# Patient Record
Sex: Male | Born: 1935 | Race: White | Hispanic: No | Smoking: Never smoker
Health system: Southern US, Community
[De-identification: ages and names within clinical notes are randomized; demographics above are authoritative.]

## PROBLEM LIST (undated history)

## (undated) DIAGNOSIS — E119 Type 2 diabetes mellitus without complications: Secondary | ICD-10-CM

## (undated) DIAGNOSIS — I10 Essential (primary) hypertension: Secondary | ICD-10-CM

## (undated) DIAGNOSIS — H9190 Unspecified hearing loss, unspecified ear: Secondary | ICD-10-CM

## (undated) DIAGNOSIS — Z8719 Personal history of other diseases of the digestive system: Secondary | ICD-10-CM

## (undated) DIAGNOSIS — Z8739 Personal history of other diseases of the musculoskeletal system and connective tissue: Secondary | ICD-10-CM

## (undated) DIAGNOSIS — Z8781 Personal history of (healed) traumatic fracture: Secondary | ICD-10-CM

## (undated) DIAGNOSIS — K219 Gastro-esophageal reflux disease without esophagitis: Secondary | ICD-10-CM

## (undated) DIAGNOSIS — E0500 Thyrotoxicosis with diffuse goiter without thyrotoxic crisis or storm: Secondary | ICD-10-CM

## (undated) DIAGNOSIS — J189 Pneumonia, unspecified organism: Secondary | ICD-10-CM

## (undated) DIAGNOSIS — Z87442 Personal history of urinary calculi: Secondary | ICD-10-CM

## (undated) DIAGNOSIS — C801 Malignant (primary) neoplasm, unspecified: Secondary | ICD-10-CM

## (undated) DIAGNOSIS — J302 Other seasonal allergic rhinitis: Secondary | ICD-10-CM

## (undated) DIAGNOSIS — M1990 Unspecified osteoarthritis, unspecified site: Secondary | ICD-10-CM

## (undated) HISTORY — DX: Other seasonal allergic rhinitis: J30.2

## (undated) HISTORY — DX: Thyrotoxicosis with diffuse goiter without thyrotoxic crisis or storm: E05.00

## (undated) HISTORY — DX: Unspecified hearing loss, unspecified ear: H91.90

## (undated) HISTORY — PX: HELLER MYOTOMY: SHX5259

## (undated) HISTORY — PX: LOBECTOMY: SHX5089

## (undated) HISTORY — PX: THORACOSCOPY: SUR1347

---

## 1995-11-21 ENCOUNTER — Encounter: Payer: Self-pay | Admitting: Internal Medicine

## 1998-04-28 ENCOUNTER — Encounter: Payer: Self-pay | Admitting: Urology

## 1998-04-28 ENCOUNTER — Observation Stay (HOSPITAL_COMMUNITY): Admission: RE | Admit: 1998-04-28 | Discharge: 1998-04-29 | Payer: Self-pay | Admitting: Urology

## 1998-05-26 ENCOUNTER — Ambulatory Visit: Admission: RE | Admit: 1998-05-26 | Discharge: 1998-05-26 | Payer: Self-pay | Admitting: Radiation Oncology

## 1998-06-07 ENCOUNTER — Encounter: Admission: RE | Admit: 1998-06-07 | Discharge: 1998-08-30 | Payer: Self-pay | Admitting: Radiation Oncology

## 1998-08-31 ENCOUNTER — Encounter: Admission: RE | Admit: 1998-08-31 | Discharge: 1998-11-29 | Payer: Self-pay | Admitting: Radiation Oncology

## 1998-11-30 ENCOUNTER — Encounter: Payer: Self-pay | Admitting: Urology

## 1998-11-30 ENCOUNTER — Encounter: Admission: RE | Admit: 1998-11-30 | Discharge: 1998-11-30 | Payer: Self-pay | Admitting: Urology

## 1998-12-01 ENCOUNTER — Other Ambulatory Visit: Admission: RE | Admit: 1998-12-01 | Discharge: 1998-12-01 | Payer: Self-pay | Admitting: Internal Medicine

## 1998-12-01 ENCOUNTER — Encounter (INDEPENDENT_AMBULATORY_CARE_PROVIDER_SITE_OTHER): Payer: Self-pay | Admitting: *Deleted

## 1998-12-01 ENCOUNTER — Encounter (INDEPENDENT_AMBULATORY_CARE_PROVIDER_SITE_OTHER): Payer: Self-pay | Admitting: Specialist

## 1998-12-08 ENCOUNTER — Encounter: Admission: RE | Admit: 1998-12-08 | Discharge: 1998-12-08 | Payer: Self-pay | Admitting: Urology

## 1998-12-08 ENCOUNTER — Encounter: Payer: Self-pay | Admitting: Urology

## 1999-02-16 ENCOUNTER — Encounter: Payer: Self-pay | Admitting: Internal Medicine

## 1999-02-21 ENCOUNTER — Encounter: Payer: Self-pay | Admitting: Internal Medicine

## 1999-02-21 ENCOUNTER — Ambulatory Visit (HOSPITAL_COMMUNITY): Admission: RE | Admit: 1999-02-21 | Discharge: 1999-02-21 | Payer: Self-pay | Admitting: Internal Medicine

## 1999-03-24 ENCOUNTER — Encounter (INDEPENDENT_AMBULATORY_CARE_PROVIDER_SITE_OTHER): Payer: Self-pay | Admitting: *Deleted

## 1999-03-24 ENCOUNTER — Ambulatory Visit (HOSPITAL_COMMUNITY): Admission: RE | Admit: 1999-03-24 | Discharge: 1999-03-24 | Payer: Self-pay | Admitting: Internal Medicine

## 1999-03-24 ENCOUNTER — Encounter: Payer: Self-pay | Admitting: Internal Medicine

## 2000-03-27 ENCOUNTER — Encounter: Payer: Self-pay | Admitting: Urology

## 2000-03-27 ENCOUNTER — Encounter: Admission: RE | Admit: 2000-03-27 | Discharge: 2000-03-27 | Payer: Self-pay | Admitting: Urology

## 2000-06-06 ENCOUNTER — Encounter: Admission: RE | Admit: 2000-06-06 | Discharge: 2000-06-06 | Payer: Self-pay | Admitting: Urology

## 2000-06-06 ENCOUNTER — Encounter: Payer: Self-pay | Admitting: Urology

## 2000-10-18 ENCOUNTER — Encounter: Payer: Self-pay | Admitting: Urology

## 2000-10-18 ENCOUNTER — Encounter (INDEPENDENT_AMBULATORY_CARE_PROVIDER_SITE_OTHER): Payer: Self-pay | Admitting: *Deleted

## 2000-10-18 ENCOUNTER — Encounter: Admission: RE | Admit: 2000-10-18 | Discharge: 2000-10-18 | Payer: Self-pay | Admitting: Urology

## 2000-10-22 ENCOUNTER — Ambulatory Visit (HOSPITAL_COMMUNITY): Admission: EM | Admit: 2000-10-22 | Discharge: 2000-10-22 | Payer: Self-pay | Admitting: Emergency Medicine

## 2000-10-22 ENCOUNTER — Encounter: Payer: Self-pay | Admitting: Urology

## 2000-10-26 ENCOUNTER — Encounter: Admission: RE | Admit: 2000-10-26 | Discharge: 2000-10-26 | Payer: Self-pay | Admitting: Urology

## 2000-10-26 ENCOUNTER — Encounter: Payer: Self-pay | Admitting: Urology

## 2002-05-16 ENCOUNTER — Encounter: Admission: RE | Admit: 2002-05-16 | Discharge: 2002-05-30 | Payer: Self-pay | Admitting: Neurological Surgery

## 2003-01-01 ENCOUNTER — Inpatient Hospital Stay (HOSPITAL_COMMUNITY): Admission: RE | Admit: 2003-01-01 | Discharge: 2003-01-02 | Payer: Self-pay | Admitting: Neurological Surgery

## 2003-08-05 ENCOUNTER — Inpatient Hospital Stay (HOSPITAL_COMMUNITY): Admission: RE | Admit: 2003-08-05 | Discharge: 2003-08-09 | Payer: Self-pay | Admitting: Orthopedic Surgery

## 2003-10-07 ENCOUNTER — Emergency Department (HOSPITAL_COMMUNITY): Admission: EM | Admit: 2003-10-07 | Discharge: 2003-10-07 | Payer: Self-pay | Admitting: Emergency Medicine

## 2003-10-09 ENCOUNTER — Ambulatory Visit (HOSPITAL_COMMUNITY): Admission: RE | Admit: 2003-10-09 | Discharge: 2003-10-09 | Payer: Self-pay | Admitting: Urology

## 2003-10-12 ENCOUNTER — Ambulatory Visit (HOSPITAL_COMMUNITY): Admission: RE | Admit: 2003-10-12 | Discharge: 2003-10-12 | Payer: Self-pay | Admitting: Urology

## 2003-11-11 ENCOUNTER — Encounter: Payer: Self-pay | Admitting: Internal Medicine

## 2003-11-12 ENCOUNTER — Inpatient Hospital Stay (HOSPITAL_COMMUNITY): Admission: RE | Admit: 2003-11-12 | Discharge: 2003-11-15 | Payer: Self-pay | Admitting: Orthopedic Surgery

## 2003-11-30 ENCOUNTER — Ambulatory Visit (HOSPITAL_COMMUNITY): Admission: RE | Admit: 2003-11-30 | Discharge: 2003-11-30 | Payer: Self-pay | Admitting: Internal Medicine

## 2003-11-30 ENCOUNTER — Encounter: Payer: Self-pay | Admitting: Internal Medicine

## 2007-01-15 ENCOUNTER — Ambulatory Visit: Payer: Self-pay | Admitting: Internal Medicine

## 2007-02-06 ENCOUNTER — Ambulatory Visit: Payer: Self-pay | Admitting: Internal Medicine

## 2007-08-15 ENCOUNTER — Encounter: Payer: Self-pay | Admitting: Internal Medicine

## 2008-01-24 ENCOUNTER — Emergency Department (HOSPITAL_COMMUNITY): Admission: EM | Admit: 2008-01-24 | Discharge: 2008-01-24 | Payer: Self-pay | Admitting: Emergency Medicine

## 2008-10-12 ENCOUNTER — Ambulatory Visit (HOSPITAL_COMMUNITY): Admission: RE | Admit: 2008-10-12 | Discharge: 2008-10-12 | Payer: Self-pay | Admitting: Urology

## 2008-10-22 ENCOUNTER — Encounter (INDEPENDENT_AMBULATORY_CARE_PROVIDER_SITE_OTHER): Payer: Self-pay | Admitting: *Deleted

## 2009-01-30 HISTORY — PX: HIATAL HERNIA REPAIR: SHX195

## 2009-02-17 ENCOUNTER — Telehealth (INDEPENDENT_AMBULATORY_CARE_PROVIDER_SITE_OTHER): Payer: Self-pay | Admitting: *Deleted

## 2009-02-17 ENCOUNTER — Ambulatory Visit: Payer: Self-pay | Admitting: Internal Medicine

## 2009-02-17 ENCOUNTER — Encounter (INDEPENDENT_AMBULATORY_CARE_PROVIDER_SITE_OTHER): Payer: Self-pay | Admitting: *Deleted

## 2009-02-18 ENCOUNTER — Ambulatory Visit: Payer: Self-pay | Admitting: Internal Medicine

## 2009-02-24 ENCOUNTER — Telehealth: Payer: Self-pay | Admitting: Internal Medicine

## 2009-02-24 DIAGNOSIS — R1310 Dysphagia, unspecified: Secondary | ICD-10-CM | POA: Insufficient documentation

## 2009-02-25 ENCOUNTER — Ambulatory Visit (HOSPITAL_COMMUNITY): Admission: RE | Admit: 2009-02-25 | Discharge: 2009-02-25 | Payer: Self-pay | Admitting: Internal Medicine

## 2009-02-25 ENCOUNTER — Telehealth (INDEPENDENT_AMBULATORY_CARE_PROVIDER_SITE_OTHER): Payer: Self-pay

## 2009-02-25 ENCOUNTER — Ambulatory Visit: Payer: Self-pay | Admitting: Internal Medicine

## 2009-02-25 DIAGNOSIS — R933 Abnormal findings on diagnostic imaging of other parts of digestive tract: Secondary | ICD-10-CM | POA: Insufficient documentation

## 2009-02-25 LAB — CONVERTED CEMR LAB
BUN: 15 mg/dL (ref 6–23)
Creatinine, Ser: 1.1 mg/dL (ref 0.4–1.5)

## 2009-02-26 ENCOUNTER — Telehealth: Payer: Self-pay | Admitting: Internal Medicine

## 2009-02-26 ENCOUNTER — Ambulatory Visit: Payer: Self-pay | Admitting: Internal Medicine

## 2009-02-28 ENCOUNTER — Telehealth: Payer: Self-pay | Admitting: Gastroenterology

## 2009-02-28 ENCOUNTER — Encounter (INDEPENDENT_AMBULATORY_CARE_PROVIDER_SITE_OTHER): Payer: Self-pay | Admitting: *Deleted

## 2009-02-28 ENCOUNTER — Emergency Department (HOSPITAL_COMMUNITY): Admission: EM | Admit: 2009-02-28 | Discharge: 2009-02-28 | Payer: Self-pay | Admitting: Emergency Medicine

## 2009-03-02 ENCOUNTER — Ambulatory Visit (HOSPITAL_COMMUNITY): Admission: RE | Admit: 2009-03-02 | Discharge: 2009-03-02 | Payer: Self-pay | Admitting: Internal Medicine

## 2009-03-02 ENCOUNTER — Encounter: Payer: Self-pay | Admitting: Internal Medicine

## 2009-03-04 ENCOUNTER — Ambulatory Visit: Payer: Self-pay | Admitting: Internal Medicine

## 2009-03-05 ENCOUNTER — Telehealth: Payer: Self-pay | Admitting: Internal Medicine

## 2009-03-08 ENCOUNTER — Telehealth: Payer: Self-pay | Admitting: Internal Medicine

## 2009-03-09 ENCOUNTER — Ambulatory Visit: Payer: Self-pay | Admitting: Internal Medicine

## 2009-03-09 ENCOUNTER — Encounter (INDEPENDENT_AMBULATORY_CARE_PROVIDER_SITE_OTHER): Payer: Self-pay | Admitting: *Deleted

## 2009-03-09 DIAGNOSIS — K220 Achalasia of cardia: Secondary | ICD-10-CM | POA: Insufficient documentation

## 2009-03-09 DIAGNOSIS — K625 Hemorrhage of anus and rectum: Secondary | ICD-10-CM | POA: Insufficient documentation

## 2009-03-10 ENCOUNTER — Ambulatory Visit (HOSPITAL_COMMUNITY): Admission: RE | Admit: 2009-03-10 | Discharge: 2009-03-10 | Payer: Self-pay | Admitting: Internal Medicine

## 2009-03-10 ENCOUNTER — Encounter: Payer: Self-pay | Admitting: Internal Medicine

## 2009-04-20 ENCOUNTER — Telehealth (INDEPENDENT_AMBULATORY_CARE_PROVIDER_SITE_OTHER): Payer: Self-pay | Admitting: *Deleted

## 2009-04-26 ENCOUNTER — Encounter (INDEPENDENT_AMBULATORY_CARE_PROVIDER_SITE_OTHER): Payer: Self-pay

## 2009-04-27 ENCOUNTER — Telehealth: Payer: Self-pay | Admitting: Internal Medicine

## 2009-04-27 ENCOUNTER — Ambulatory Visit: Payer: Self-pay | Admitting: Internal Medicine

## 2009-04-30 ENCOUNTER — Telehealth: Payer: Self-pay | Admitting: Internal Medicine

## 2009-05-10 ENCOUNTER — Ambulatory Visit: Payer: Self-pay | Admitting: Internal Medicine

## 2009-05-12 ENCOUNTER — Encounter: Payer: Self-pay | Admitting: Internal Medicine

## 2009-05-13 ENCOUNTER — Inpatient Hospital Stay (HOSPITAL_COMMUNITY): Admission: EM | Admit: 2009-05-13 | Discharge: 2009-05-14 | Payer: Self-pay | Admitting: Emergency Medicine

## 2009-05-13 ENCOUNTER — Ambulatory Visit: Payer: Self-pay | Admitting: Internal Medicine

## 2009-05-13 ENCOUNTER — Telehealth (INDEPENDENT_AMBULATORY_CARE_PROVIDER_SITE_OTHER): Payer: Self-pay | Admitting: *Deleted

## 2009-07-27 ENCOUNTER — Telehealth: Payer: Self-pay | Admitting: Internal Medicine

## 2009-07-27 ENCOUNTER — Encounter (INDEPENDENT_AMBULATORY_CARE_PROVIDER_SITE_OTHER): Payer: Self-pay | Admitting: *Deleted

## 2009-07-27 ENCOUNTER — Encounter: Payer: Self-pay | Admitting: Internal Medicine

## 2009-07-28 ENCOUNTER — Ambulatory Visit: Payer: Self-pay | Admitting: Gastroenterology

## 2009-07-28 ENCOUNTER — Ambulatory Visit: Payer: Self-pay | Admitting: Cardiology

## 2009-07-28 DIAGNOSIS — Z860100 Personal history of colon polyps, unspecified: Secondary | ICD-10-CM | POA: Insufficient documentation

## 2009-07-28 DIAGNOSIS — R1032 Left lower quadrant pain: Secondary | ICD-10-CM | POA: Insufficient documentation

## 2009-07-28 DIAGNOSIS — K5730 Diverticulosis of large intestine without perforation or abscess without bleeding: Secondary | ICD-10-CM | POA: Insufficient documentation

## 2009-07-28 DIAGNOSIS — I10 Essential (primary) hypertension: Secondary | ICD-10-CM | POA: Insufficient documentation

## 2009-07-28 DIAGNOSIS — K219 Gastro-esophageal reflux disease without esophagitis: Secondary | ICD-10-CM | POA: Insufficient documentation

## 2009-07-28 DIAGNOSIS — R1012 Left upper quadrant pain: Secondary | ICD-10-CM | POA: Insufficient documentation

## 2009-07-28 LAB — CONVERTED CEMR LAB
BUN: 18 mg/dL (ref 6–23)
Basophils Absolute: 0 10*3/uL (ref 0.0–0.1)
Basophils Relative: 0.3 % (ref 0.0–3.0)
CO2: 29 meq/L (ref 19–32)
Calcium: 9.6 mg/dL (ref 8.4–10.5)
Chloride: 106 meq/L (ref 96–112)
Creatinine, Ser: 1 mg/dL (ref 0.4–1.5)
Eosinophils Absolute: 0.1 10*3/uL (ref 0.0–0.7)
Eosinophils Relative: 2 % (ref 0.0–5.0)
GFR calc non Af Amer: 73.76 mL/min (ref >60)
Glucose, Bld: 92 mg/dL (ref 70–99)
HCT: 46.5 % (ref 39.0–52.0)
Hemoglobin: 15.6 g/dL (ref 13.0–17.0)
Lymphocytes Relative: 21.9 % (ref 12.0–46.0)
Lymphs Abs: 1.6 10*3/uL (ref 0.7–4.0)
MCHC: 33.6 g/dL (ref 30.0–36.0)
MCV: 91.3 fL (ref 78.0–100.0)
Monocytes Absolute: 0.8 10*3/uL (ref 0.1–1.0)
Monocytes Relative: 11.5 % (ref 3.0–12.0)
Neutro Abs: 4.7 10*3/uL (ref 1.4–7.7)
Neutrophils Relative %: 64.3 % (ref 43.0–77.0)
Platelets: 228 10*3/uL (ref 150.0–400.0)
Potassium: 4.8 meq/L (ref 3.5–5.1)
RBC: 5.09 M/uL (ref 4.22–5.81)
RDW: 12.9 % (ref 11.5–14.6)
Sodium: 142 meq/L (ref 135–145)
WBC: 7.3 10*3/uL (ref 4.5–10.5)

## 2009-08-30 ENCOUNTER — Ambulatory Visit: Payer: Self-pay | Admitting: Internal Medicine

## 2009-08-30 DIAGNOSIS — K5900 Constipation, unspecified: Secondary | ICD-10-CM | POA: Insufficient documentation

## 2009-08-30 DIAGNOSIS — K5732 Diverticulitis of large intestine without perforation or abscess without bleeding: Secondary | ICD-10-CM | POA: Insufficient documentation

## 2009-09-29 ENCOUNTER — Ambulatory Visit: Payer: Self-pay | Admitting: Internal Medicine

## 2009-10-07 ENCOUNTER — Ambulatory Visit: Payer: Self-pay | Admitting: Internal Medicine

## 2009-10-11 ENCOUNTER — Encounter: Payer: Self-pay | Admitting: Internal Medicine

## 2009-11-15 ENCOUNTER — Telehealth: Payer: Self-pay | Admitting: Internal Medicine

## 2009-11-16 ENCOUNTER — Ambulatory Visit: Payer: Self-pay | Admitting: Gastroenterology

## 2009-11-16 ENCOUNTER — Encounter: Payer: Self-pay | Admitting: Internal Medicine

## 2009-11-16 DIAGNOSIS — Z8546 Personal history of malignant neoplasm of prostate: Secondary | ICD-10-CM | POA: Insufficient documentation

## 2009-11-16 DIAGNOSIS — K224 Dyskinesia of esophagus: Secondary | ICD-10-CM | POA: Insufficient documentation

## 2009-11-16 LAB — CONVERTED CEMR LAB
ALT: 13 units/L (ref 0–53)
AST: 20 units/L (ref 0–37)
Albumin: 4.1 g/dL (ref 3.5–5.2)
Alkaline Phosphatase: 83 units/L (ref 39–117)
Basophils Absolute: 0 10*3/uL (ref 0.0–0.1)
Basophils Relative: 0.4 % (ref 0.0–3.0)
Bilirubin, Direct: 0.2 mg/dL (ref 0.0–0.3)
Eosinophils Absolute: 0.1 10*3/uL (ref 0.0–0.7)
Eosinophils Relative: 1.7 % (ref 0.0–5.0)
HCT: 48.3 % (ref 39.0–52.0)
Hemoglobin: 16.4 g/dL (ref 13.0–17.0)
Lymphocytes Relative: 23.5 % (ref 12.0–46.0)
Lymphs Abs: 1.4 10*3/uL (ref 0.7–4.0)
MCHC: 34 g/dL (ref 30.0–36.0)
MCV: 92 fL (ref 78.0–100.0)
Monocytes Absolute: 0.8 10*3/uL (ref 0.1–1.0)
Monocytes Relative: 12.8 % — ABNORMAL HIGH (ref 3.0–12.0)
Neutro Abs: 3.7 10*3/uL (ref 1.4–7.7)
Neutrophils Relative %: 61.6 % (ref 43.0–77.0)
Platelets: 183 10*3/uL (ref 150.0–400.0)
RBC: 5.25 M/uL (ref 4.22–5.81)
RDW: 13.9 % (ref 11.5–14.6)
Total Bilirubin: 1 mg/dL (ref 0.3–1.2)
Total Protein: 6.9 g/dL (ref 6.0–8.3)
WBC: 6.1 10*3/uL (ref 4.5–10.5)

## 2009-11-22 ENCOUNTER — Ambulatory Visit: Payer: Self-pay | Admitting: Gastroenterology

## 2009-11-22 ENCOUNTER — Ambulatory Visit (HOSPITAL_COMMUNITY): Admission: RE | Admit: 2009-11-22 | Discharge: 2009-11-22 | Payer: Self-pay | Admitting: Internal Medicine

## 2009-11-22 ENCOUNTER — Encounter: Payer: Self-pay | Admitting: Internal Medicine

## 2009-11-25 ENCOUNTER — Telehealth: Payer: Self-pay | Admitting: Internal Medicine

## 2009-11-29 ENCOUNTER — Encounter (INDEPENDENT_AMBULATORY_CARE_PROVIDER_SITE_OTHER): Payer: Self-pay | Admitting: *Deleted

## 2009-11-29 ENCOUNTER — Telehealth: Payer: Self-pay | Admitting: Internal Medicine

## 2009-12-10 ENCOUNTER — Ambulatory Visit (HOSPITAL_COMMUNITY): Admission: RE | Admit: 2009-12-10 | Discharge: 2009-12-10 | Payer: Self-pay | Admitting: Internal Medicine

## 2009-12-10 ENCOUNTER — Encounter: Payer: Self-pay | Admitting: Internal Medicine

## 2009-12-21 ENCOUNTER — Telehealth: Payer: Self-pay | Admitting: Internal Medicine

## 2009-12-27 ENCOUNTER — Telehealth: Payer: Self-pay | Admitting: Internal Medicine

## 2009-12-29 ENCOUNTER — Ambulatory Visit (HOSPITAL_COMMUNITY)
Admission: RE | Admit: 2009-12-29 | Discharge: 2009-12-29 | Payer: Self-pay | Source: Home / Self Care | Admitting: Internal Medicine

## 2009-12-31 ENCOUNTER — Telehealth: Payer: Self-pay | Admitting: Internal Medicine

## 2010-01-04 ENCOUNTER — Telehealth: Payer: Self-pay | Admitting: Internal Medicine

## 2010-01-17 ENCOUNTER — Telehealth: Payer: Self-pay | Admitting: Internal Medicine

## 2010-01-17 ENCOUNTER — Emergency Department (HOSPITAL_COMMUNITY)
Admission: EM | Admit: 2010-01-17 | Discharge: 2010-01-17 | Payer: Self-pay | Source: Home / Self Care | Admitting: Emergency Medicine

## 2010-02-08 ENCOUNTER — Encounter: Payer: Self-pay | Admitting: Internal Medicine

## 2010-03-01 NOTE — Letter (Signed)
Summary: Patient Notice- Polyp Results  [facility]  [address] [place], [place] [address]   Phone: [phone]  Fax: [phone]        May 12, 2009 MRN: [id number]    [facility] [address] [place], [place]  [address]    Dear Mr. Wolman,  I am pleased to inform you that the colon polyp(s) removed during your recent colonoscopy was (were) found to be benign (no cancer detected) upon pathologic examination.  I recommend you have a repeat colonoscopy examination in 3-4 MONTHS to look for recurrent polyps, as having colon polyps increases your risk for having recurrent polyps or even colon cancer in the future.  Should you develop new or worsening symptoms of abdominal pain, bowel habit changes or bleeding from the rectum or bowels, please schedule an evaluation with either your primary care physician or with me.  Additional information/recommendations:  __ No further action with gastroenterology is needed at this time. Please      follow-up with your primary care physician for your other healthcare      needs.   Please call us if you are having persistent problems or have questions about your condition that have not been fully answered at this time.  Sincerely,  Hilarie Fredrickson MD  This letter has been electronically signed by your physician.  Appended Document: Patient Notice- Polyp Results letter mailed 4.18.11

## 2010-03-01 NOTE — Assessment & Plan Note (Signed)
Summary: DIVERTICULITIS - followup from Amy    History of Present Illness Visit Type: Follow-up Visit Primary GI MD: Yancey Flemings MD Primary Provider: Blair Heys, MD Requesting Provider: na Chief Complaint: F/u visit for LUQ abd pain, and vomiting. Pt states he is still having LUQ pain, and vomiting History of Present Illness:   77 year old withhypertension, renal nephrolithiasis, adenomatous colon polyps, achalasia, and diverticulosis. He was evaluated in this office July 28, 2009 for left lower quadrant pain and fever. CT Scan revealed uncomplicated diverticulitis. He was treated with a combination of ciprofloxacin and metronidazole. For whatever reason, he did not complete the entire course of antibiotics. He initially had improvement in symptoms only to have somewhat of a recurrence. He said difficulties with constipation. He underwent colonoscopy in April 2011. Multiple and large polyps including one with high-grade dysplasia. Followup in 3-6 months recommended. He is accompanied today by his wife. Chronic medical problems otherwise stable.   GI Review of Systems    Reports abdominal pain and  vomiting.     Location of  Abdominal pain: LLQ.    Denies acid reflux, belching, bloating, chest pain, dysphagia with liquids, dysphagia with solids, heartburn, loss of appetite, nausea, vomiting blood, weight loss, and  weight gain.      Reports diverticulosis.     Denies anal fissure, black tarry stools, change in bowel habit, constipation, diarrhea, fecal incontinence, heme positive stool, hemorrhoids, irritable bowel syndrome, jaundice, light color stool, liver problems, rectal bleeding, and  rectal pain.    Current Medications (verified): 1)  Metoprolol Tartrate 100 Mg Tabs (Metoprolol Tartrate) .Marland Kitchen.. 1 Tablet By Mouth Once Daily 2)  Allergy Relief 4 Mg Tabs (Chlorpheniramine Maleate) .... As Needed 3)  Nexium 40 Mg Cpdr (Esomeprazole Magnesium) .... Take 1 Tablet By Mouth Once  Daily  Allergies (verified): No Known Drug Allergies  Past History:  Past Medical History: Reviewed history from 07/28/2009 and no changes required. Esophageal Stricture/ACHALASIA DX 2011 GERD Diverticulosis Hemorrhoids Colon Polyps Tubular Adenomas multiple Kidney Stones Hypertension  Past Surgical History: Reviewed history from 03/09/2009 and no changes required. Hernia X2 bilateral knee replacement  Family History: Reviewed history from 03/09/2009 and no changes required. Family History of Prostate Cancer: son, brothers X3 No FH of Colon Cancer: Family History of Heart Disease: brothers X2  Social History: Reviewed history from 07/28/2009 and no changes required. Patient has never smoked.  Alcohol Use - no Illicit Drug Use - no Occupation: Retired  Review of Systems       The patient complains of allergy/sinus.  The patient denies anemia, anxiety-new, arthritis/joint pain, back pain, blood in urine, breast changes/lumps, change in vision, confusion, cough, coughing up blood, depression-new, fainting, fatigue, fever, headaches-new, hearing problems, heart murmur, heart rhythm changes, itching, menstrual pain, muscle pains/cramps, night sweats, nosebleeds, pregnancy symptoms, shortness of breath, skin rash, sleeping problems, sore throat, swelling of feet/legs, swollen lymph glands, thirst - excessive , urination - excessive , urination changes/pain, urine leakage, vision changes, and voice change.    Vital Signs:  Patient profile:   77 year old male Height:      74 inches Weight:      191 pounds BMI:     24.61 BSA:     2.13 Pulse rate:   64 / minute Pulse rhythm:   regular BP sitting:   122 / 74  (left arm) Cuff size:   regular  Vitals Entered By: Ok Anis CMA (August 30, 2009 8:17 AM)  Physical Exam  General:  Well developed, well nourished, no acute distress. Head:  Normocephalic and atraumatic. Eyes:  PERRLA, no icterus. Mouth:  No deformity or  lesions, Lungs:  Clear throughout to auscultation. Heart:  Regular rate and rhythm; no murmurs, rubs,  or bruits. Abdomen:  soft and nondistended. No organomegaly. Normal bowel sounds. Tenderness in the left lower quadrant to palpation. Msk:  Symmetrical with no gross deformities. Normal posture. Pulses:  Normal pulses noted. Extremities:  no edema Neurologic:  alert and oriented Skin:  Intact without significant lesions or rashes. Psych:  Alert and cooperative. Normal mood and affect.   Impression & Recommendations:  Problem # 1:  DIVERTICULITIS-COLON (ICD-562.11) recent problems with diverticulitis. He appears to have improved though incompletely.  Plan: #1. Cipro 500 mg p.o. b.i.d. x2 weeks. Prescribed #2. Flagyl 500 mg p.o. b.i.d. x2 weeks. Prescribed  Problem # 2:  CONSTIPATION (ICD-564.00) ongoing.  Plan #1. GlycoLax 17 g in 8 ounces of water daily. Prescribed  Problem # 3:  PERSONAL HX COLONIC POLYPS (ICD-V12.72) due for followup. Need to resolve problems with acute diverticulitis first.  Plan: #1. Office followup in 4 weeks. If diverticulitis resolves, then set up colonoscopy.  Problem # 4:  GERD (ICD-530.81) had been using Nexium with good results. Cannot afford prescription. Using Prilosec OTC. May double dose if needed.  Problem # 5:  ACHALASIA (ICD-530.0) swallowing better after Botox therapy. Can repeat Botox therapy p.r.n. recurrent significant dysphagia  Patient Instructions: 1)  We are sending in your prescriptions to your pharmacy today 2)  Copy sent to : Blair Heys, MD 3)  Please make a follow up office appointment in 4 weeks 4)  The medication list was reviewed and reconciled.  All changed / newly prescribed medications were explained.  A complete medication list was provided to the patient / caregiver. Prescriptions: FLAGYL 500 MG TABS (METRONIDAZOLE) 1 by mouth two times a day for 14 days  #28 x 0   Entered by:   Milford Cage NCMA   Authorized  by:   Hilarie Fredrickson MD   Signed by:   Milford Cage NCMA on 08/30/2009   Method used:   Electronically to        [id number]       [address]       [place], [place]  [address]       Ph: [phone]       Fax: [phone]   RxID:   [id number] CIPRO 500 MG TABS (CIPROFLOXACIN HCL) 1 by mouth two times a day  #28 x 0   Entered by:   Milford Cage NCMA   Authorized by:   Hilarie Fredrickson MD   Signed by:   Milford Cage NCMA on 08/30/2009   Method used:   [facility] to        [facility] [facility] [address] [id number]       [address]       [place], [place]  [address]       Ph: [phone]       Fax: [phone]   RxID:   [id number]

## 2010-03-01 NOTE — Progress Notes (Signed)
Summary: Triage  Phone Note Call from Patient Call back at Home Phone [phone] Call back at Work Phone [phone]   Caller: Joseph [name] Call For: Dr. Marina Goodell Reason for Call: Talk to Nurse Summary of Call: problems swallowing and "cannot eat" Initial call taken by: Karna Christmas,  November 15, 2009 9:51 AM  Follow-up for Phone Call        pt is unable to eat and drink only small sips, has resurrent botox injections with Dr Marina Goodell.  Appt with Amy on 10/18 Follow-up by: Chales Abrahams CMA [name]),  November 15, 2009 9:59 AM

## 2010-03-01 NOTE — Progress Notes (Signed)
   Phone Note From Other Clinic   Caller: Provider Summary of Call: On call note at 1305. Pt presented to [facility] ED today with ongoing mild chest pain and dysphagia. Recent imaging studies and EGD by Dr. Marina Goodell reviewed and concerning for achalasia. ED MD states pt is frustrated with mild chest pain and ongoing dysphagia. No acute events or findings per ED MD. Advised a full liquid diet, multiple small meals. hyoscamine 1-2 SL ac. Call Dr. Marina Goodell tomorrow for further plans.  Initial call taken by: Meryl Dare MD Clementeen Graham,  February 28, 2009 1:09 PM

## 2010-03-01 NOTE — Progress Notes (Signed)
Summary: recall colon- already scheduled   ---- Converted from flag ---- ---- 04/20/2009 9:17 AM, Hilarie Fredrickson MD wrote: he needs a standard recall letter. was one sent? if not, send one (code v12.72)  ---- 04/19/2009 5:15 PM, Harlow Mares CMA (AAMA) wrote: Patient is part of the recall project, would you like to address his recall colonoscopy at this time? He does not have any futher office visits set up for his dysphagia.   Hulan Saas ------------------------------

## 2010-03-01 NOTE — Letter (Signed)
Summary: EGD Instructions  [facility]  [address] [place], [place] [address]   Phone: [phone]  Fax: [phone]       RAYFIELD BEEM    1931-02-13    MRN: [id number]       Procedure Day /Date:  Thursday 02/18/2009     Arrival Time:  8:30 am     Procedure Time:  9:30 am     [facility] of Procedure[facility]                    [facility]   PREPARATION FOR ENDOSCOPY   On 02/18/2009 THE DAY OF THE PROCEDURE:    1.   Do not drink anything colored red or purple.  Avoid juices with pulp.  No orange juice.  Stay on clear liquids until 7:30 am (per Dr. Marina Goodell)                                                CLEAR LIQUIDS INCLUDE: Water Jello Ice Popsicles Tea (sugar ok, no milk/cream) Powdered fruit flavored drinks Coffee (sugar ok, no milk/cream) Gatorade Juice: apple, white grape, white cranberry  Lemonade Clear bullion, consomm, broth Carbonated beverages (any kind) Strained chicken noodle soup Hard Candy                   OTHER INSTRUCTIONS  You will need a responsible adult at least 75 years of age to accompany you and drive you home.   This person must remain in the waiting room during your procedure.  Wear loose fitting clothing that is easily removed.  Leave jewelry and other valuables at home.  However, you may wish to bring a book to read or an iPod/MP3 player to listen to music as you wait for your procedure to start.  Remove all body piercing jewelry and leave at home.  Total time from sign-in until discharge is approximately 2-3 hours.  You should go home directly after your procedure and rest.  You can resume normal activities the day after your procedure.  The day of your procedure you should not:   Drive   Make legal decisions   Operate machinery   Drink alcohol   Return to work  You will receive specific instructions about eating, activities and medications before you leave.    The  above instructions have been reviewed and explained to me by   Ezra Sites RN  February 17, 2009 3:37 PM     I fully understand and can verbalize these instructions _____________________________ Date _________

## 2010-03-01 NOTE — Progress Notes (Signed)
Summary: Pain in chest  Phone Note Call from Patient Call back at Home Phone [phone]   Call For: Dr Marina Goodell Reason for Call: Talk to Nurse Summary of Call: Had botox treatment on Monday since then has been having pain in chest, nausea & vomitting. Initial call taken by: Leanor Kail [facility],  November 25, 2009 12:25 PM  Follow-up for Phone Call        pt is having throat  pain and vomiting.  Has no appetite since Monday after EDG and Botox. Does not feel like food is going down.  Wife is very concerned.  Please Advise Follow-up by: Chales Abrahams CMA [name]),  November 25, 2009 1:52 PM  Additional Follow-up for Phone Call Additional follow up Details #1::        Dr Marina Goodell was contacted and advised the pt may need to give the Botox a week.  The pt was advised to stay on soft foods until he is able to swallow normally.  He said he did not need anything for nausea called in because he still has something,  he will call next week to report on symptoms. Additional Follow-up by: Chales Abrahams CMA [name]),  November 25, 2009 3:38 PM

## 2010-03-01 NOTE — Letter (Signed)
Summary: EGD Instructions  [facility]  [address] [place], [place] [address]   Phone: [phone]  Fax: [phone]       ARLENE GENOVA    01/29/1932    MRN: [id number]       Procedure Day /Date:12/10/09  FRI     Arrival Time: 12 noon     Procedure Time:1 pm     Location of Procedure:                     [facility] ( Outpatient Registration)    PREPARATION FOR ENDOSCOPY   On 12/10/09  THE DAY OF THE PROCEDURE:  1.   No solid foods, milk or milk products are allowed after midnight the night before your procedure.  2.   Do not drink anything colored red or purple.  Avoid juices with pulp.  No orange juice.  3.  You may drink clear liquids until 9 am , which is 2 hours before your procedure.                                                                                                CLEAR LIQUIDS INCLUDE: Water Jello Ice Popsicles Tea (sugar ok, no milk/cream) Powdered fruit flavored drinks Coffee (sugar ok, no milk/cream) Gatorade Juice: apple, white grape, white cranberry  Lemonade Clear bullion, consomm, broth Carbonated beverages (any kind) Strained chicken noodle soup Hard Candy   MEDICATION INSTRUCTIONS  Unless otherwise instructed, you should take regular prescription medications with a small sip of water as early as possible the morning of your procedure.              OTHER INSTRUCTIONS  You will need a responsible adult at least 75 years of age to accompany you and drive you home.   This person must remain in the waiting room during your procedure.  Wear loose fitting clothing that is easily removed.  Leave jewelry and other valuables at home.  However, you may wish to bring a book to read or an iPod/MP3 player to listen to music as you wait for your procedure to start.  Remove all body piercing jewelry and leave at home.  Total time from sign-in until discharge is approximately 2-3 hours.  You should go  home directly after your procedure and rest.  You can resume normal activities the day after your procedure.  The day of your procedure you should not:   Drive   Make legal decisions   Operate machinery   Drink alcohol   Return to work  You will receive specific instructions about eating, activities and medications before you leave.    The above instructions have been reviewed and explained to me by   Chales Abrahams CMA [name])  November 29, 2009 2:21 PM     I fully understand and can verbalize these instructions over the phone mailed to home Date 11/29/09

## 2010-03-01 NOTE — Procedures (Signed)
Summary: Soil scientist   Imported By: Sherian Rein 03/10/2009 10:33:28  _____________________________________________________________________  External Attachment:    Type:   Image     Comment:   External Document

## 2010-03-01 NOTE — Progress Notes (Signed)
Summary: Unable to swallow   Phone Note Call from Patient Call back at cell [phone]   Call For: Dr Marina Goodell Summary of Call: Came in personally to request to be worked in asap- unable to swallow. I advised him we would call him back with appoinment. Initial call taken by: Leanor Kail [facility],  July 27, 2009 10:44 AM  Follow-up for Phone Call        Saw Dr.Grapey this am and was told to call Dr.Jazma Pickel for appt.Two wks. ago he started with severe lower back pain.Now has LUQ.pain and vomiting.Given appt. with PA for tomorrow. Follow-up by: Teryl Lucy RN,  July 27, 2009 1:05 PM

## 2010-03-01 NOTE — Progress Notes (Signed)
Summary: TRIAGE - dysphagia   Phone Note Other Incoming   Summary of Call:  Pt. came to the office complaining of dysphagiaafter being with son who underwent surgery for prostate cancer this am Says symptoms started 2 weeks ago but have worsened in the past 2 days.He is currently in no acute distress and is able to talk and swallow his saliva.Says he can not swallow solids at all.Vomited dinner back up last p.m. and same happened when he attempted to eat a cookie over at the hospital this am. Pt.went home to lay down and I instructed him to stay NPO until he hears from me after discussing this with Dr.Perry when he returns to the office this afternoon. Initial call taken by: Teryl Lucy RN,  February 17, 2009 1:31 PM  Follow-up for Phone Call        set him up for upper endoscopy with esophageal dilation tomorrow at 9:30 AM in the [facility]. Check his medications to make sure that he is not on blood thinners or diabetic agents. In the interim, clear liquids only. Follow-up by: Hilarie Fredrickson MD,  February 17, 2009 2:14 PM    Additional Follow-up for Phone Call Additional follow up Details #2::    pt. scheduled for egd/dil at [facility] in am .Will come for pre-visit at 3:30pm today. Follow-up by: Teryl Lucy RN,  February 17, 2009 2:35 PM

## 2010-03-01 NOTE — Letter (Signed)
Summary: [facility] Instructions  [facility]  [address] [place], [place] [address]   Phone: [phone]  Fax: [phone]       Joseph [name]    1931-09-02    MRN: [id number]        Procedure Day Dorna Bloom:  Duanne Limerick  05/10/09     Arrival Time:  9:00AM     Procedure Time:  10:00AM     Location of Procedure:                    _X _  [facility] (4th Floor)   PREPARATION FOR COLONOSCOPY WITH MOVIPREP   Starting 5 days prior to your procedure 05/05/09 do not eat nuts, seeds, popcorn, corn, beans, peas,  salads, or any raw vegetables.  Do not take any fiber supplements (e.g. Metamucil, Citrucel, and Benefiber).  THE DAY BEFORE YOUR PROCEDURE         DATE: 05/09/09  DAY: SUNDAY  1.  Drink clear liquids the entire day-NO SOLID FOOD  2.  Do not drink anything colored red or purple.  Avoid juices with pulp.  No orange juice.  3.  Drink at least 64 oz. (8 glasses) of fluid/clear liquids during the day to prevent dehydration and help the prep work efficiently.  CLEAR LIQUIDS INCLUDE: Water Jello Ice Popsicles Tea (sugar ok, no milk/cream) Powdered fruit flavored drinks Coffee (sugar ok, no milk/cream) Gatorade Juice: apple, white grape, white cranberry  Lemonade Clear bullion, consomm, broth Carbonated beverages (any kind) Strained chicken noodle soup Hard Candy                             4.  In the morning, mix first dose of MoviPrep solution:    Empty 1 Pouch A and 1 Pouch B into the disposable container    Add lukewarm drinking water to the top line of the container. Mix to dissolve    Refrigerate (mixed solution should be used within 24 hrs)  5.  Begin drinking the prep at 5:00 p.m. The MoviPrep container is divided by 4 marks.   Every 15 minutes drink the solution down to the next mark (approximately 8 oz) until the full liter is complete.   6.  Follow completed prep with 16 oz of clear liquid of your choice (Nothing red or purple).   Continue to drink clear liquids until bedtime.  7.  Before going to bed, mix second dose of MoviPrep solution:    Empty 1 Pouch A and 1 Pouch B into the disposable container    Add lukewarm drinking water to the top line of the container. Mix to dissolve    Refrigerate  THE DAY OF YOUR PROCEDURE      DATE: 05/10/09 DAY: MONDAY  Beginning at 5:00a.m. (5 hours before procedure):         1. Every 15 minutes, drink the solution down to the next mark (approx 8 oz) until the full liter is complete.  2. Follow completed prep with 16 oz. of clear liquid of your choice.    3. You may drink clear liquids until 8:00AM (2 HOURS BEFORE PROCEDURE).   MEDICATION INSTRUCTIONS  Unless otherwise instructed, you should take regular prescription medications with a small sip of water   as early as possible the morning of your procedure.         OTHER INSTRUCTIONS  You will need a responsible adult at least  75 years of age to accompany you and drive you home.   This person must remain in the waiting room during your procedure.  Wear loose fitting clothing that is easily removed.  Leave jewelry and other valuables at home.  However, you may wish to bring a book to read or  an iPod/MP3 player to listen to music as you wait for your procedure to start.  Remove all body piercing jewelry and leave at home.  Total time from sign-in until discharge is approximately 2-3 hours.  You should go home directly after your procedure and rest.  You can resume normal activities the  day after your procedure.  The day of your procedure you should not:   Drive   Make legal decisions   Operate machinery   Drink alcohol   Return to work  You will receive specific instructions about eating, activities and medications before you leave.    The above instructions have been reviewed and explained to me by   Ulis Rias RN  April 27, 2009 3:34 PM     I fully understand and can verbalize these  instructions _____________________________ Date _________

## 2010-03-01 NOTE — Progress Notes (Signed)
Summary: Patient with Rectal Bleeding today, post colonscopy 05/10/09   Phone Note Call from Patient Call back at Home Phone [phone]   Caller: Spouse Reason for Call: Talk to Nurse, Talk to Doctor Action Taken: Provider Notified Summary of Call: Patient's wife called [facility] regarding Mr. Knipple current condition.  She states that he has rectal bleeding this morning for the first time since his colonscopy this past Monday, 05/10/09.  Patient states the blood looks thick but the commode only has blood in it.  Patient states, "It is pretty bad."  Mr. Xiang has large polyp removed and clip placed after polypectomy. Initial call taken by: Jennye Boroughs, RN     Appended Document: Patient with Rectal Bleeding today, post colonscopy 05/10/09 suspect post polypectomy bleeding from the large pedunculated polyp in the sigmoid colon, despite prophylactic Endo Clip placement. She needs to go to the hospital. Please notify the PA at [facility] long.  Appended Document: Patient with Rectal Bleeding today, post colonscopy 05/10/09     Phone Note Outgoing Call   Call placed by: Quincy Carnes RN,  May 13, 2009 10:29 AM Summary of Call: Willette Cluster, NP notified of Mr. Otterness case via telephone.  She will be awaiting his arrival at the ED [facility].  I called Mrs. Kazmi and gave her Paula's name and instructions to go to the ED.  They were leaving now for the 10 minute trip to the ED.

## 2010-03-01 NOTE — Assessment & Plan Note (Signed)
Summary: vomiting,luq. pain-Dr.Perry pt.    History of Present Illness Visit Type: Follow-up Visit Primary GI MD: Yancey Flemings MD Primary Provider: Blair Heys, MD Chief Complaint: Vomiting , RUQ pain x 2-3 weeks History of Present Illness:   77 YO MALE KNOWN TO DR. PERRY. PT WITH HX OF DIVERTICULOSIS,RECENT ADMISSION FOR A GI BLEED-POST POLYPECTOMY OF A LARGE SIGMOID COLON POLYP ON 05/10/09. 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  HE COMES IN TODAY FEELING POORLY OVER THE PAST COUPLE OF WEEKS. HE STARTED WITH LEFT MID BACK PAIN WHICH FELT LIKE A KIDNEY STONE. THIS PERSISTED,THEN EVENTUALLY MIGRATED TO THE LLQ/LMQ. HE HAS HAD INTERMITTENT FEVER-101 AT HOME THIS PAST WEEKEND.BM'S REGULAR,NO MELENA OR HEME.  HE ALSO C/O INCREASED REGURGITATION.NO DYSPHAGIA OR ODYNOPHAGIA, HAS ALOT OF SINUS DRAINGE THAT GAGS HIM, AND SOMETIMES HER REGURGITATES IT BACK UP . HE ALSO RELATES DAILY HEARBURN AND INDIGESTION. HAS ZANTAC FOR as needed USE-NOT MUCH HELP. HE WAS SEEN YESTERDAY AT DR. Ellin Goodie OFFICE. UA WAS NEGATIVE-NO OTHER LABS OR XRAYS BUT DID FEEL THAT HE HAD A STONE.   GI Review of Systems    Reports abdominal pain, acid reflux, chest pain, heartburn, nausea, and  vomiting.     Location of  Abdominal pain: LUQ.    Denies belching, bloating, dysphagia with liquids, dysphagia with solids, loss of appetite, vomiting blood, weight loss, and  weight gain.      Reports change in bowel habits, constipation, and  diarrhea.     Denies anal fissure, black tarry stools, diverticulosis, fecal incontinence, heme positive stool, hemorrhoids, irritable bowel syndrome, jaundice, light color stool, liver problems, rectal bleeding, and  rectal pain.    Current Medications (verified): 1)  Metoprolol Tartrate 100 Mg Tabs (Metoprolol Tartrate) .Marland Kitchen.. 1 Tablet By Mouth Once Daily 2)  Allergy Relief  4 Mg Tabs (Chlorpheniramine Maleate) .... As Needed  Allergies (verified): No Known Drug Allergies  Past History:  Past Medical History: Esophageal Stricture/ACHALASIA DX 2011 GERD Diverticulosis Hemorrhoids Colon Polyps Tubular Adenomas multiple Kidney Stones Hypertension  Past Surgical History: Reviewed history from 03/09/2009 and no changes required. Hernia X2 bilateral knee replacement  Family History: Reviewed history from 03/09/2009 and no changes required. Family History of Prostate Cancer: son, brothers X3 No FH of Colon Cancer: Family History of Heart Disease: brothers X2  Social History: Patient has never smoked.  Alcohol Use - no Illicit Drug Use - no Occupation: Retired  Review of Systems       The patient complains of allergy/sinus, arthritis/joint pain, cough, fatigue, fever, muscle pains/cramps, and night sweats.  The patient denies anemia, anxiety-new, back pain, blood in urine, breast changes/lumps, change in vision, confusion, coughing up blood, depression-new, fainting, headaches-new, hearing problems, heart murmur, heart rhythm changes, itching, menstrual pain, nosebleeds, pregnancy symptoms, shortness of breath, skin rash, sleeping problems, sore throat, swelling of feet/legs, swollen lymph glands, thirst - excessive , urination - excessive , urination changes/pain, urine leakage, vision changes, and voice change.         otherwise see hpi  Vital Signs:  Patient profile:   77 year old male Height:      74 inches Weight:      186.50 pounds BMI:     24.03 Pulse rate:   76 / minute Pulse rhythm:   regular BP sitting:   120 / 78  (left arm) Cuff size:   regular  Vitals  Entered By: June McMurray CMA [name]) (July 28, 2009 1:19 PM)  Physical Exam  General:  Well developed, well nourished, no acute distress. Head:  Normocephalic and atraumatic. Eyes:  PERRLA, no icterus. Lungs:  Clear throughout to auscultation. Heart:  Regular rate and rhythm;  no murmurs, rubs,  or bruits. Abdomen:  SOFT, TENDER LMQ/LLQ WITH FULLNESS IN LLQ ?PALP LOOP OF BOWEL,NO GUARDING OR REBOUND,BS+ Rectal:  NOT DONE Extremities:  No clubbing, cyanosis, edema or deformities noted. Neurologic:  Alert and  oriented x4;  grossly normal neurologically. Psych:  Alert and cooperative. Normal mood and affect.   Impression & Recommendations:  Problem # 1:  ABDOMINAL PAIN, LEFT LOWER QUADRANT (ICD-789.04) Assessment New 78 YO MALE WITH 2 WEEK HX OF ILLNESS WITH LEFT BACK PAIN EVENTUALLY MIGRATING TO THE LLQ,INTERMITTENT FEVERS. R/O DIVERTICULITIS,R/O URETROLITHIASIS.  LABS TODAY SCHEDULE FOR CT SCAN ABD/PELVIS -WILL HOLD ON ABX UNTIL LABS /CT REVIEWED. Orders: TLB-CBC Platelet - w/Differential (85025-CBCD) TLB-BMP (Basic Metabolic Panel-BMET) (80048-METABOL) CT Abdomen/Pelvis with Contrast (CT Abd/Pelvis w/con)  Problem # 2:  GERD (ICD-530.81) Assessment: Deteriorated POORLY CONTROLLED  START NEXIUM 40 MG DAILY IN AM.  Problem # 3:  PERSONAL HX COLONIC POLYPS (ICD-V12.72) Assessment: Comment Only FOCAL HIGH GRADE DYSPLASIA ONE SIGMOID POLYP 4/11- NEEDS F/U COLONOSCOPY WITH DR. PERRY 8/11  FOLLOW UP WITH DR. PERRY IN 3 WEEKS.  Problem # 4:  ACHALASIA (ICD-530.0) Assessment: Unchanged SOME PERSISTENT REGURGITATION SXS -MONITOR FOR NOW, ADD PPI, MAY NEED REPEAT BOTOX IF SXS PROGRESS.  Problem # 5:  HYPERTENSION (ICD-401.9) Assessment: Comment Only  Patient Instructions: 1)  You have been scheduled for a CT scan of your abdomen and pelvis today at 4:00 pm. We have given you written instructions regarding this. 2)  Please go to the basement before leaving the office today and have a BMET and CBC-Diff drawn. 3)  We have given you samples of Nexium to take 1 capsule once daily. We have also sent an electronic prescription to your pharmacy for Nexium that you may pick up after your samples have run out. 4)  We have made a follow up appointment for you on  08/30/09 @ 8:30 am with Dr Marina Goodell. Should you have worsening symptoms before that, please call our office. 5)  Copy sent to : Dr Blair Heys, Dr Yancey Flemings 6)  The medication list was reviewed and reconciled.  All changed / newly prescribed medications were explained.  A complete medication list was provided to the patient / caregiver. Prescriptions: NEXIUM 40 MG CPDR (ESOMEPRAZOLE MAGNESIUM) Take 1 tablet by mouth once daily  #30 x 2   Entered by:   Lamona Curl CMA (AAMA)   Authorized by:   Sammuel Cooper PA-c   Signed by:   Lamona Curl CMA (AAMA) on 07/28/2009   Method used:   Electronically to        [facility]  [address] [phone][facility] (retail)       [address]       [place], [place]  [address]       Ph: [phone]       Fax: [phone]   RxID:   [id number]

## 2010-03-01 NOTE — Progress Notes (Signed)
Summary: triage / SWALLOWING ISSUES   Phone Note Call from Patient Call back at Home Phone [phone]   Caller: Joseph [name] Call For: Marina Goodell Reason for Call: Talk to Nurse Summary of Call: Wife states that her husband was gagging all day yesterday and spitting up blood, wants to know what to do. Initial call taken by: Tawni Levy,  March 08, 2009 11:06 AM  Follow-up for Phone Call         Pt. had  many episodes of gagging yesterday and x3 brought up chunks of BRB.No blood noted today.Tolerating ice cream and pudding but Ensure gives him diarrhea. Follow-up by: Teryl Lucy RN,  March 08, 2009 11:20 AM  Additional Follow-up for Phone Call Additional follow up Details #1::        STAY ON THE MODIFIED DIET. MAKE SURE NO BLOOD IN STOOLS. GET ME MANO REPORT TODAY TO REVIEW. IF MORE BLEEDING, GO TO HOSPITAL. I NEED TO SEE HIM IN THE OFFICE TOMORROW OR THURS. WIFE SHOULD COME Additional Follow-up by: Hilarie Fredrickson MD,  March 08, 2009 11:41 AM    Additional Follow-up for Phone Call Additional follow up Details #2::    [name]  contacted and they will try to retrieve manometry report and call me back to come pick it up.Wife ntfd. of Dr.Perry's orders and pt. given rov appt. for tomorrow. Pt. not available - wife will have him call me when he returns as she said he has seen some blood in his stool and he will give me the details.Told if has lg.amt .rectal bleeding needs to go to ER ,otherwise keep appt. with Dr.Perry for tomorrow.  Follow-up by: Teryl Lucy RN,  March 08, 2009 12:00 PM

## 2010-03-01 NOTE — Letter (Signed)
Summary: Patient Notice- Polyp Results  [facility]  [address] [place], [place] [address]   Phone: [phone]  Fax: [phone]        October 11, 2009 MRN: [id number]    [facility] [address] [place], [place]  [address]    Dear Joseph [name],  I am pleased to inform you that the colon polyp(s) removed during your recent colonoscopy was (were) found to be benign (no cancer detected) upon pathologic examination.  I recommend you have a repeat colonoscopy examination in 2 years to look for recurrent polyps, as having colon polyps increases your risk for having recurrent polyps or even colon cancer in the future.  Should you develop new or worsening symptoms of abdominal pain, bowel habit changes or bleeding from the rectum or bowels, please schedule an evaluation with either your primary care physician or with me.  Additional information/recommendations:  __ No further action with gastroenterology is needed at this time. Please      follow-up with your primary care physician for your other healthcare      needs.  Please call us if you are having persistent problems or have questions about your condition that have not been fully answered at this time.  Sincerely,  Hilarie Fredrickson MD  This letter has been electronically signed by your physician.  Appended Document: Patient Notice- Polyp Results letter mailed

## 2010-03-01 NOTE — Letter (Signed)
Summary: EGD Instructions  [facility]  [address] [place], [place] [address]   Phone: [phone]  Fax: [phone]       Joseph [name]    11/14/1931    MRN: [id number]       Procedure Day /Date: 11-22-09     Arrival Time: 8:00 AM      Procedure Time: 9:00 AM     Location of Procedure:                     [name] ( Outpatient Registration)   PREPARATION FOR ENDOSCOPY   On 11-22-09 THE DAY OF THE PROCEDURE:  1.   No solid foods, milk or milk products are allowed after midnight the night before your procedure.  2.   Do not drink anything colored red or purple.  Avoid juices with pulp.  No orange juice.  3.  You may drink clear liquids until 5:00 AM  , which is 4 hours before your procedure.                                                                                                CLEAR LIQUIDS INCLUDE: Water Jello Ice Popsicles Tea (sugar ok, no milk/cream) Powdered fruit flavored drinks Coffee (sugar ok, no milk/cream) Gatorade Juice: apple, white grape, white cranberry  Lemonade Clear bullion, consomm, broth Carbonated beverages (any kind) Strained chicken noodle soup Hard Candy   MEDICATION INSTRUCTIONS  Unless otherwise instructed, you should take regular prescription medications with a small sip of water as early as possible the morning of your procedure.           OTHER INSTRUCTIONS  You will need a responsible adult at least 75 years of age to accompany you and drive you home.   This person must remain in the waiting room during your procedure.  Wear loose fitting clothing that is easily removed.  Leave jewelry and other valuables at home.  However, you may wish to bring a book to read or an iPod/MP3 player to listen to music as you wait for your procedure to start.  Remove all body piercing jewelry and leave at home.  Total time from sign-in until discharge is approximately 2-3 hours.  You should go  home directly after your procedure and rest.  You can resume normal activities the day after your procedure.  The day of your procedure you should not:   Drive   Make legal decisions   Operate machinery   Drink alcohol   Return to work  You will receive specific instructions about eating, activities and medications before you leave.    The above instructions have been reviewed and explained to me by   _______________________    I fully understand and can verbalize these instructions _____________________________ Date _________

## 2010-03-01 NOTE — Progress Notes (Signed)
Summary: Triage--Dysphagia after Botox  Phone Note Call from Patient   Caller: Joseph [name] [phone] Call For: Dr. Marina Goodell Reason for Call: Talk to Nurse Summary of Call: Having problems swallowing...."botox did not work last week" Initial call taken by: Karna Christmas,  November 29, 2009 12:27 PM  Follow-up for Phone Call        Dr Marina Goodell the pt is still complaining of dysphagia to solids and liquids.  He does not feel like the Botox worked.  Please advise Follow-up by: Chales Abrahams CMA [name]),  November 29, 2009 12:53 PM  Additional Follow-up for Phone Call Additional follow up Details #1::        It obviously didn't. His swallowing difficulties are chronic and felt secondary to achalasia. He is not a good candidate for myotomy because of prior fundoplication surgery. I guess we could give Botox one more try. See if you can find a spot at lunch time, say around 12:30, that I could go across the street and do an EGD with Botox. Additional Follow-up by: Hilarie Fredrickson MD,  November 29, 2009 1:47 PM    Additional Follow-up for Phone Call Additional follow up Details #2::    pt agrees to have the EGD repeated I will schedule and call the pt back with instructions. Follow-up by: Chales Abrahams CMA [name]),  November 29, 2009 2:09 PM  Additional Follow-up for Phone Call Additional follow up Details #3:: Details for Additional Follow-up Action Taken: Appt scheduled and pt aware and instructed.  Meds reviewed Additional Follow-up by: Chales Abrahams CMA [name]),  November 29, 2009 2:23 PM

## 2010-03-01 NOTE — Progress Notes (Signed)
Summary: Triage  Phone Note Call from Patient Call back at Home Phone [phone]   Caller: Patients Daughter Call For: Dr. Marina Goodell Reason for Call: Talk to Nurse Summary of Call: Pts daughter is calling about the results of the barium swallow, says you may have talked to pts wife yesterday but she can not remember the converstation, daughter will be at the patients house after 11:30 today and you can reach her there Initial call taken by: [place] Johnson,  December 31, 2009 10:57 AM  Follow-up for Phone Call        Pt's wife called with Dr. they want ref. to and it is on Dr.Nedra Mcinnis's desk for review per her request.. Follow-up by: Teryl Lucy RN,  January 03, 2010 3:30 PM

## 2010-03-01 NOTE — Op Note (Signed)
Summary: Esophageal Manometry       NAME:  Joseph [name], Joseph [name]              ACCOUNT NO.:  [id number]      MEDICAL RECORD NO.:  [id number]          PATIENT TYPE:  AMB      LOCATION:  ENDO                         FACILITY:  [facility]      PHYSICIAN:  Wilhemina Bonito. Marina Goodell, MD      DATE OF BIRTH:  Apr 27, 1931      DATE OF PROCEDURE:  03/02/2009   DATE OF DISCHARGE:  03/02/2009                                  OPERATIVE REPORT      PROCEDURE:  Esophageal manometry.      INDICATIONS:  Dysphagia.      HISTORY:  This is a 77 year old gentleman with a history of GERD, for   which he underwent fundoplication in the 1970s with repeat   fundoplication in the 1980s.  He has had some intermittent dysphagia   over the years due to esophageal stricture.  He has also been felt to   have some level of motility disturbance.  Over the past 6 months his   swallowing has worsened.  He recently underwent upper endoscopy and was   found to have an atonic, dilated esophagus.  Esophagram and CT scan   confirmed the same.  He is suspected as having achalasia, either   primary, or possibly iatrogenic due to prior surgeries.  As such, for   manometry.      DESCRIPTION OF PROCEDURE:  The patient was taken to the [facility] GI department as an outpatient.  The manometry probe   was placed without difficulty.  The probe was unable to be advanced into   the stomach as it was coiling in the distal esophagus.  Measurements of   the esophageal body demonstrated no significant peristalsis.  He did   have multiple retrograde contractions.      IMPRESSION:  Manometry confirming lack of peristalsis consistent with   achalasia.      PLAN:  The patient and his wife will follow up in the office to discuss   these findings.  We will also discuss therapeutic options for treatment,   with probable Botox injection therapy.               Wilhemina Bonito. Marina Goodell, MD            JNP/MEDQ  D:  03/11/2009  T:  03/11/2009   Job:  [id number]         Electronically Signed by Yancey Flemings MD on 03/13/2009 11:36:31 AM

## 2010-03-01 NOTE — Letter (Signed)
Summary: EGD Instructions   [facility]  [address] [place], [place] [address]   Phone: [phone]  Fax: [phone]       Joseph [name]    Nov 22, 1931    MRN: [id number]       Procedure Day /Date:03/10/09     Arrival Time:10:45 am      Procedure Time:11:45 am     Location of Procedure:                     _[facility] ( Outpatient Registration)    PREPARATION FOR ENDOSCOPY   On2/9/11 THE DAY OF THE PROCEDURE:  1.   No solid foods, milk or milk products are allowed after midnight the night before your procedure.  2.   Do not drink anything colored red or purple.  Avoid juices with pulp.  No orange juice.  3.  You may drink clear liquids until 745 am, which is 4 hours before your procedure.                                                                                                CLEAR LIQUIDS INCLUDE: Water Jello Ice Popsicles Tea (sugar ok, no milk/cream) Powdered fruit flavored drinks Coffee (sugar ok, no milk/cream) Gatorade Juice: apple, white grape, white cranberry  Lemonade Clear bullion, consomm, broth Carbonated beverages (any kind) Strained chicken noodle soup Hard Candy   MEDICATION INSTRUCTIONS  Unless otherwise instructed, you should take regular prescription medications with a small sip of water as early as possible the morning of your procedure.               OTHER INSTRUCTIONS  You will need a responsible adult at least 75 years of age to accompany you and drive you home.   This person must remain in the waiting room during your procedure.  Wear loose fitting clothing that is easily removed.  Leave jewelry and other valuables at home.  However, you may wish to bring a book to read or an iPod/MP3 player to listen to music as you wait for your procedure to start.  Remove all body piercing jewelry and leave at home.  Total time from sign-in until discharge is approximately 2-3 hours.  You should go  home directly after your procedure and rest.  You can resume normal activities the day after your procedure.  The day of your procedure you should not:   Drive   Make legal decisions   Operate machinery   Drink alcohol   Return to work  You will receive specific instructions about eating, activities and medications before you leave.    The above instructions have been reviewed and explained to me by   _______________________    I fully understand and can verbalize these instructions _____________________________ Date _________

## 2010-03-01 NOTE — Letter (Signed)
Summary: Colonoscopy Letter  [facility]  [address] [place], [place] [address]   Phone: [phone]  Fax: [phone]      July 27, 2009 MRN: [id number]   [facility] [address] [place], [place]  [address]   Dear Mr. Swarey,   According to your medical record, it is time for you to schedule a Colonoscopy. The American Cancer Society recommends this procedure as a method to detect early colon cancer. Patients with a family history of colon cancer, or a personal history of colon polyps or inflammatory bowel disease are at increased risk.  This letter has been generated based on the recommendations made at the time of your procedure. If you feel that in your particular situation this may no longer apply, please contact our office.  Please call our office at [phone] to schedule this appointment or to update your records at your earliest convenience.  Thank you for cooperating with [place] to provide you with the very best care possible.   Sincerely,  Wilhemina Bonito. Marina Goodell, M.D.  [facility]astroenterology Division [phone]

## 2010-03-01 NOTE — Progress Notes (Signed)
Summary: test results request   Phone Note Call from Patient Call back at Home Phone [phone]   Caller: wife, Mora Bellman Call For: Dr. Marina Goodell Reason for Call: Talk to Nurse, Lab or Test Results Summary of Call: would like test results and wants to report that pt is still unable to keep food down Initial call taken by: Vallarie Mare,  March 05, 2009 1:17 PM  Follow-up for Phone Call        I advised the patient's wife that Dr Marina Goodell hasn't read Manometry yet.  I have asked her to keep him on full liquids and continue to take hyoscyamine prior to meals.  I advised her we will call back next week when the results are available.  I have left a message for Staphanie at [facility] to please bring mano results asap.  Reviewed with Dr Marina Goodell.   Follow-up by: Darcey Nora RN, CGRN,  March 05, 2009 2:46 PM

## 2010-03-01 NOTE — Procedures (Signed)
Summary: Upper Endoscopy  Patient: Aayan Haskew Note: All result statuses are Final unless otherwise noted.  Tests: (1) Upper Endoscopy (EGD)   EGD Upper Endoscopy       DONE     [facility]     [address] [place], [place]  [address]           ENDOSCOPY PROCEDURE REPORT           PATIENT:  Namari, Breton  MR#:  [id number]     BIRTHDATE:  04-02-1931, 77 yrs. old  GENDER:  male           ENDOSCOPIST:  Wilhemina Bonito. Eda Keys, MD     Referred by:  Office           PROCEDURE DATE:  03/10/2009     PROCEDURE:  EGD w/botox injection     ASA CLASS:  Class II     INDICATIONS:  Botox injection for achalasia, dysphagia           MEDICATIONS:   Fentanyl 60 mcg IV, Versed 6 mg     TOPICAL ANESTHETIC:  Cetacaine Spray           DESCRIPTION OF PROCEDURE:   After the risks benefits and     alternatives of the procedure were thoroughly explained, informed     consent was obtained.  The EG-[id number] ([id number]) endoscope was     introduced through the mouth and advanced to the second portion of     the duodenum, without limitations.  The instrument was slowly     withdrawn as the mucosa was fully examined.     <<PROCEDUREIMAGES>>           The esophagus was dilated and atonic w/ pooled secretions     distally. Retroflexed views revealed prior fundoplication with the     stomach otherwise unremarkable. The bulb and second duodenum were     also unremarkable.           THERAPY: 100U BOTOX INJECTED INTO DISTAL ESOPHAGUS IN 4 QUADRANTS     (25U / QUAD) , 1CM ABOVE Z LINE AND AT 45 DEGREE ANGLE.           The scope was then withdrawn from the patient and the procedure     completed.           COMPLICATIONS:  None           ENDOSCOPIC IMPRESSION:     1) Achalasia - S/P BOTOX INJECTION THERAPY     2) Prior fundoplication     RECOMMENDATIONS:     1) CALL MY OFFICE IN 2 WEEKS TO GIVE ME FOLLOW UP FROM THE     PROCECDURE           ______________________________     Wilhemina Bonito. Eda Keys, MD           CC:  Blair Heys, MD, The Patient           n.     eSIGNED:   Wilhemina Bonito. Eda Keys at 03/10/2009 12:33 PM           Jola Baptist, [phone]  Note: An exclamation mark (!) indicates a result that was not dispersed into the flowsheet. Document Creation Date: 03/10/2009 12:33 PM _______________________________________________________________________  (1) Order result status: Final Collection or observation date-time: 03/10/2009 12:24 Requested date-time:  Receipt date-time:  Reported date-time:  Referring Physician:   Ordering Physician: Jonny Ruiz  Eda Keys [id number]) Specimen Source:  Source: Launa Grill Order Number: [id number] Lab site:

## 2010-03-01 NOTE — Progress Notes (Signed)
Summary: triage / ongoing dysphagia   Phone Note Call from Patient Call back at Home Phone [phone] Call back at [phone]   Caller: wife, Ruby Call For: Dr. Marina Goodell Reason for Call: Talk to Nurse Summary of Call: pt had recent EGD and since then has been choking, gagging, and vomiting... can ingest liquids but has trouble getting down JELLO Initial call taken by: Vallarie Mare,  February 24, 2009 9:32 AM  Follow-up for Phone Call        Had procedure 02/18/2009 wife says he has not been able to ingest any solids and liquids will not go down the whole way he just brings them back up. Follow-up by: Teryl Lucy RN,  February 24, 2009 9:49 AM  Additional Follow-up for Phone Call Additional follow up Details #1::        set him up for a barium swallow. He may need manometry after that at some point, but let's start with a barium swallow Additional Follow-up by: Hilarie Fredrickson MD,  February 24, 2009 9:52 AM  New Problems: DYSPHAGIA UNSPECIFIED (ICD-787.20)   Additional Follow-up for Phone Call Additional follow up Details #2::     Pt.scheduled for Ba Swallow at [facility] tomorrow morning at 9 am. Pt. instructed to be there at 8:45 a.m and is to be NPO after midnight. Follow-up by: Teryl Lucy RN,  February 24, 2009 11:27 AM  New Problems: DYSPHAGIA UNSPECIFIED [phone])

## 2010-03-01 NOTE — Progress Notes (Signed)
Summary: Triage / Swallowing trouble  Phone Note Call from Patient   Caller: Daughter Fannie Knee [phone] Call For: Dr. Marina Goodell Summary of Call: Had Botox injections last week and cannot keep any food down Initial call taken by: Karna Christmas,  December 21, 2009 12:23 PM  Follow-up for Phone Call        I talked with pt. on his cell phone as he is with wife at [facility] who just had surgery.Has not been able to tolerate solids since botox  on 11/11/11as they get hung up and he has to vomit.Unable since Sunday to keep liquids down says he is weak and dizzy due to no oral intake. Follow-up by: Teryl Lucy RN,  December 21, 2009 2:01 PM  Additional Follow-up for Phone Call Additional follow up Details #1::        Not sure why he's having so much troble. Not much else to offer at this point. He might give it some more time (as this seemed to help before). Should be able to eat smaller meals more frequently Additional Follow-up by: Hilarie Fredrickson MD,  December 21, 2009 2:17 PM    Additional Follow-up for Phone Call Additional follow up Details #2::    Pt. will try 6 sm. meals a day avoiding meat ,rice and breads.Will try creamed soups and soft foods. Follow-up by: Teryl Lucy RN,  December 21, 2009 2:57 PM

## 2010-03-01 NOTE — Progress Notes (Signed)
Summary: Follow up   Phone Note Outgoing Call   Summary of Call:  Pt. doing good after EGD with botox. has had only 2 episodes where food felt like it was going down slowly and that occured with meat and breads so he just stopped eating for  a few minutes and it went down. Is ok to go ahead and schedule his colon he is due. Initial call taken by: Teryl Lucy RN,  April 20, 2009 10:11 AM  Follow-up for Phone Call        great. yes, go ahead and schedule colon Follow-up by: Hilarie Fredrickson MD,  April 20, 2009 10:41 AM  Additional Follow-up for Phone Call Additional follow up Details #1::         message left that pt. can schedule direct colon. Additional Follow-up by: Teryl Lucy RN,  April 20, 2009 11:05 AM

## 2010-03-01 NOTE — Miscellaneous (Signed)
Summary: [facility]  Clinical Lists Changes  Observations: Added new observation of NKA: T (02/17/2009 15:13)

## 2010-03-01 NOTE — Assessment & Plan Note (Signed)
Summary: dysphagia solids and liquids/pl         perry pt   History of Present Illness Visit Type: Follow-up Visit Primary GI MD: Yancey Flemings MD Primary Provider: Blair Heys, MD Requesting Provider: na Chief Complaint: dysphagia solids and liquids x 1 week History of Present Illness:   PLEASANT 78 YO MALE KNOWN TO DR. PERRY  WITH HX OF ACHALASIA. HE UNDERWENT EGD WITH BOTOX  IN 2/11 WITH GOOD RESPONSE. HE SAYS HIS SWALLOWING WAS MUCH BETTER,AND HAD NO DIFFICULTY UNTIL ABOUT A WEEK AGO WHEN SXS RETURNED. HE IS NOW HAVING DIFFICULTY SWALLOWING LIQUIDS AND SOLIDS. HE SAYS THINGS GO PART WAY DONE THEN HE HAS TO REGURGITATE. HE HAS NOT LOST ANY WEIGHT AS YET.  HE ALSO HAS BEEN HAVING SOME ABDOMINAL DISCOMFORT SINCE 10/16 IN LEFT ABDOMEN, MILD NAUSEA. NO FEVER,CHILLS. NO DIARRHEA,MELENA. HE HAS HX OF DIVERTICULAR DISEASE , AND RECURRENT DIVERTICULITIS-LAST TREATED 3-4 MONTHS AGO.   GI Review of Systems    Reports abdominal pain, dysphagia with liquids, dysphagia with solids, and  nausea.     Location of  Abdominal pain: LLQ.    Denies acid reflux, belching, bloating, chest pain, heartburn, loss of appetite, vomiting, vomiting blood, weight loss, and  weight gain.        Denies anal fissure, black tarry stools, change in bowel habit, constipation, diarrhea, diverticulosis, fecal incontinence, heme positive stool, hemorrhoids, irritable bowel syndrome, jaundice, light color stool, liver problems, rectal bleeding, and  rectal pain.    Current Medications (verified): 1)  Metoprolol Tartrate 100 Mg Tabs (Metoprolol Tartrate) .Marland Kitchen.. 1 Tablet By Mouth Once Daily 2)  Allergy Relief 4 Mg Tabs (Chlorpheniramine Maleate) .... As Needed 3)  Nexium 40 Mg Cpdr (Esomeprazole Magnesium) .... Take 1 Tablet By Mouth Once Daily 4)  Promethazine Hcl 25 Mg Tabs (Promethazine Hcl) .Marland Kitchen.. 1 By Mouth Every 6 Hours As Needed Nausea  Allergies (verified): No Known Drug Allergies  Past History:  Past Medical  History: Esophageal Stricture/ACHALASIA DX 2011-S/P BOTOX 2/11 GERD Diverticulosis/DIVERTICULITIS Hemorrhoids Colon Polyps Tubular Adenomas multiple-LAST COLON 9/11 Kidney Stones Hypertension Prostate Cancer  Past Surgical History: Hernia X2 bilateral knee replacement Radiation for Prostate Cancer 80's  Family History: Reviewed history from 03/09/2009 and no changes required. Family History of Prostate Cancer: son, brothers X3 No FH of Colon Cancer: Family History of Heart Disease: brothers X2  Social History: Reviewed history from 09/29/2009 and no changes required. Retired Married Patient has never smoked.  Alcohol Use - no Illicit Drug Use - no  Review of Systems       The patient complains of shortness of breath.  The patient denies allergy/sinus, anemia, anxiety-new, arthritis/joint pain, back pain, blood in urine, breast changes/lumps, change in vision, confusion, cough, coughing up blood, depression-new, fainting, fatigue, fever, headaches-new, hearing problems, heart murmur, heart rhythm changes, itching, muscle pains/cramps, night sweats, nosebleeds, skin rash, sleeping problems, sore throat, swelling of feet/legs, swollen lymph glands, thirst - excessive, urination - excessive, urination changes/pain, urine leakage, vision changes, and voice change.         SEE HPI  Vital Signs:  Patient profile:   77 year old male Height:      74 inches Weight:      190 pounds BMI:     24.48 Pulse rate:   68 / minute Pulse rhythm:   regular BP sitting:   118 / 78  (left arm)  Vitals Entered By: Milford Cage NCMA (November 16, 2009 10:09 AM)  Physical Exam  General:  Well developed, well nourished, no acute distress. Head:  Normocephalic and atraumatic. Eyes:  PERRLA, no icterus. Lungs:  Clear throughout to auscultation. Heart:  Regular rate and rhythm; no murmurs, rubs,  or bruits. Abdomen:  SOFT,TENDER LLQ, NO GUARDING OR REBOUND, NO MASS OR HSM,BS+ Rectal:  NOT  DONE Extremities:  No clubbing, cyanosis, edema or deformities noted. Neurologic:  Alert and  oriented x4;  grossly normal neurologically. Psych:  Alert and cooperative. Normal mood and affect.   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  UPRIGHT FOR MEALS  REFILL PHENERGAN 25 MG Q 6 HOURS AS NEEDED WHICH HE FEELS HELPS. SCHEDULE FOR EGD WITH BOTOX INJECTIONS  WITH DR. PERRY ASAP.- PROCEDURE DISCUSSED WITH PT AND WIFE.  Problem # 2:  ABDOMINAL PAIN, LEFT LOWER QUADRANT (ICD-789.04) Assessment: Deteriorated SXS CONSISTENT WITH RECURRENT DIVERTICULITIS  LABS AS BELOW CHECK HEPATIC PANEL AS PT AND WIFE CONCERNED ABOUT LIVER PROBLEMS AS DAUGHTER JUST DX WITH  ?HEPATITIS. START CIPRO 500 MG two times a day X 10 DAYS. ADVISED TO CALL IF SXS WORSEN,FEVER DEVELOPS.  Problem # 3:  PERSONAL HX PROSTATE CANCER (ICD-V10.46) Assessment: Comment Only RADIATION PROCTITIS-QUIESCENT  Problem # 4:  PERSONAL HX COLONIC POLYPS (ICD-V12.72) Assessment: Comment Only COLONOSCOPY 10/07/09-FOLLOW UP IN 2 YEARS Orders: TLB-Hepatic/Liver Function Pnl ([id number]) TLB-CBC Platelet - w/Differential ([id number])  Other Orders: ZENDO with Botox (ZENDO/Botox)  Patient Instructions: 1)  Please go to lab, basement level. 2)  We sent a prescription for Cipro to your pharmacy [facility] [address]. 3)  We refilled the prescription for Promethazine for nausea. 4)  We scheduled the Endoscopy w. Botox with Dr. Yancey Flemings on 11-22-09. 5)  Directions and brochure provided. 6)  Copy sent to : Blair Heys, MD 7)  The medication list was reviewed and reconciled.  All changed / newly prescribed medications were explained.  A complete medication list was provided to the patient / caregiver. Prescriptions: PROMETHAZINE HCL 25 MG TABS  (PROMETHAZINE HCL) 1 by mouth every 6 hours as needed nausea  #40 x 0   Entered by:   Lowry Ram NCMA   Authorized by:   Sammuel Cooper PA-c   Signed by:   Lowry Ram NCMA on 11/16/2009   Method used:   Electronically to        [phone]* (retail)       [address]       [place], [place]  [address]       Ph: [phone]       Fax: [phone]   RxID:   [id number] CIPRO 500 MG TABS (CIPROFLOXACIN HCL) Take 1 tab twice daily for 10 days  #20 x 0   Entered by:   Lowry Ram NCMA   Authorized by:   Sammuel Cooper PA-c   Signed by:   Lowry Ram NCMA on 11/16/2009   Method used:   [facility]ronically to        [phone][facility] (retail)       [address]       [place], [place]  [address]       Ph: [phone]       Fax: [phone]   RxID:   [id number]

## 2010-03-01 NOTE — Assessment & Plan Note (Signed)
Summary: dysphagia / rectal bleeding    History of Present Illness Visit Type: Follow-up Visit Primary GI MD: Yancey Flemings MD Primary Provider: Blair Heys, MD Chief Complaint: trouble swallowing and rectal bleeding History of Present Illness:   77 year old white male with a history of hypertension, kidney stones, prostate cancer, and GERD. He presents today regarding progressive dysphagia and rectal bleeding. The patient underwent Nissen fundoplication around 1973 and again in 1984. He has had peptic stricture which has required prior esophageal dilation. He recently call the office complaining of dysphagia it was quite severe. As such, he underwent upper endoscopy on February 18, 2009. The esophagus was noted to be dilated with pooled debris distally. No overwhelming stricture. He was dilated with an 18 mm Savary dilator. This did not help his dysphagia. He was felt to have a motility disorder, possibly achalasia. As such, a barium esophagram was obtained (reviewed). This revealed a dilated esophagus with no contractility and poor emptying. There was question of mass effect distally. As such, CT scan of the chest was obtained (reviewed). No mass seen. Finally, he underwent manometry last week. The LES could not be defined. Esophageal body demonstrated aperistalsis with retrograde contractions. He is currently on a limited diet including liquids, Jell-O, ice cream, and small caliber pastas. He has lost about 10 pounds since his last office visit 2 years ago. He is accompanied today by his wife. She feels weight loss has been in the past 6 months. There was also question of vomiting clots, though this is uncertain. He has noticed some red blood per rectum periodically. His last colonoscopy was performed in 2005. Severe diverticulosis. Internal hemorrhoids noted. Mild radiation proctitis. Followup in 5 years recommended. He did receive a recall letter. He denies lower abdominal pain. He also complains of  significant heartburn and hiccuping. He is not taking PPIs.Marland Kitchen He did have some chest pain that brought him to the emergency room. The workup was negative for cardiac cause.   GI Review of Systems    Reports acid reflux, belching, chest pain, dysphagia with liquids, dysphagia with solids, heartburn, and  weight loss.      Denies abdominal pain, bloating, loss of appetite, nausea, vomiting, vomiting blood, and  weight gain.      Reports diverticulosis, hemorrhoids, and  rectal bleeding.     Denies anal fissure, black tarry stools, change in bowel habit, constipation, diarrhea, fecal incontinence, heme positive stool, irritable bowel syndrome, jaundice, light color stool, liver problems, and  rectal pain. Preventive Screening-Counseling & Management      Drug Use:  no.      Current Medications (verified): 1)  Metoprolol Tartrate 100 Mg Tabs (Metoprolol Tartrate) .Marland Kitchen.. 1 Tablet By Mouth Once Daily 2)  Levsin/sl 0.125 Mg Subl (Hyoscyamine Sulfate) .... Disolve Two Under Tounge As Needed  Allergies (verified): No Known Drug Allergies  Past History:  Past Medical History: Last updated: 03/08/2009 Esophageal Stricture GERD Diverticulosis Hemorrhoids Colon Polyps Tubular Adenoma Kidney Stones Hypertension  Past Surgical History: Hernia X2 bilateral knee replacement  Family History: Family History of Prostate Cancer: son, brothers X3 No FH of Colon Cancer: Family History of Heart Disease: brothers X2  Review of Systems       The patient complains of blood in urine, cough, fatigue, thirst - excessive, and urination - excessive.  The patient denies allergy/sinus, anemia, anxiety-new, arthritis/joint pain, back pain, breast changes/lumps, change in vision, confusion, coughing up blood, depression-new, fainting, fever, headaches-new, hearing problems, heart murmur, heart rhythm changes, itching,  menstrual pain, muscle pains/cramps, night sweats, nosebleeds, pregnancy symptoms, shortness  of breath, skin rash, sleeping problems, sore throat, swelling of feet/legs, swollen lymph glands, thirst - excessive , urination - excessive , urination changes/pain, urine leakage, vision changes, and voice change.    Vital Signs:  Patient profile:   78 year old male Height:      74 inches Weight:      191.6 pounds BMI:     24.69 Pulse rate:   68 / minute Pulse rhythm:   regular BP sitting:   118 / 70  (left arm) Cuff size:   regular  Vitals Entered By: Harlow Mares CMA [name]) (March 09, 2009 3:56 PM)  Physical Exam  General:  Well developed, well nourished, no acute distress. Head:  Normocephalic and atraumatic. Eyes:  PERRLA, no icterus. Nose:  No deformity, discharge,  or lesions. Mouth:  No deformity or lesions,  Neck:  Supple; no masses or thyromegaly. Lungs:  Clear throughout to auscultation. Heart:  Regular rate and rhythm; no murmurs, rubs,  or bruits. Abdomen:  Soft, nontender and nondistended. No masses, hepatosplenomegaly or hernias noted. Normal bowel sounds. Rectal:  no external abnormalities. Prostate feels enlarged. Red blood on the examining glove. No mass. Prostate:  enlarged Msk:  Symmetrical with no gross deformities. Normal posture. Pulses:  Normal pulses noted. Extremities:  No clubbing, cyanosis, edema or deformities noted. Neurologic:  Alert and  oriented x4;  grossly normal neurologically. Skin:  Intact without significant lesions or rashes. Cervical Nodes:  No significant cervical adenopathy.no supraclavicular adenopathy Psych:  Alert and cooperative. Normal mood and affect.   Impression & Recommendations:  Problem # 1:  ACHALASIA (ICD-530.0) the patient presents with progressive dysphagia over the past 6-12 months. There is associated weight loss. Endoscopic, radiographic, and manometric data support achalasia. This in a gentleman who has undergone prior fundoplication x2 remotely. Difficult case. We discussed treatment options including Botox  injection therapy versus surgical myotomy. The latter may be quite difficult given his prior surgeries.  Plan: #1. Upper endoscopy with Botox injection therapy. The nature of the procedure as well as the risks, benefits, and alternatives were reviewed. He understood and agreed to proceed. #2. If this is helpful it can be repeated periodically #3. If this is not helpful, then I would send him to an esophageal expert for an opinion.  Problem # 2:  DYSPHAGIA UNSPECIFIED (ICD-787.20) please see #1 above  Problem # 3:  RECTAL BLEEDING (ICD-569.3) recent problems with rectal bleeding. Statistically most likely due to known hemorrhoids or radiation proctitis. Has been over 5 years since his last colonoscopy. Recommend followup colonoscopy after esophageal issues better addressed as it may be difficult for him to complete a colonoscopy prep with his dysphasia.  Problem # 4:  NONSPECIFIC ABN FINDING RAD & OTH EXAM GI TRACT (ICD-793.4) abnormal esophagram and CT scan as previously described.  Other Orders: ZENDO with Botox (ZENDO/Botox)  Patient Instructions: 1)  You have been scheduled for a Endo for 03/10/09 at [facility]. 2)  The medication list was reviewed and reconciled.  All changed / newly prescribed medications were explained.  A complete medication list was provided to the patient / caregiver. 3)  Copy: Dr. Blair Heys

## 2010-03-01 NOTE — Letter (Signed)
Summary: [facility]  [facility]   Imported By: Lennie Odor 07/30/2009 11:25:11  _____________________________________________________________________  External Attachment:    Type:   Image     Comment:   External Document

## 2010-03-01 NOTE — Letter (Signed)
Summary: [facility] Instructions  [facility]  [address] [place], [place] [address]   Phone: [phone]  Fax: [phone]       Joseph [name]    12-01-1931    MRN: [id number]        Procedure Day /Date:THURSDAY, 10/07/09     Arrival Time:2:30 PM     Procedure Time:3:30 PM    Location of Procedure:                    [facility] (4th Floor)                        PREPARATION FOR COLONOSCOPY WITH MOVIPREP   Starting 5 days prior to your procedure 10/02/09 do not eat nuts, seeds, popcorn, corn, beans, peas,  salads, or any raw vegetables.  Do not take any fiber supplements (e.g. Metamucil, Citrucel, and Benefiber).   TWO DAYS BEFORE YOUR PROCEDURE     DATE: 10/05/09 DAY: Jake Shark  You will be on clear liquids only after 3:30 pm (48 hours prior to test)- NO SOLID FOOD (See acceptable clear liquid choices on the chart below  ____________________________________________________________________________  THE DAY BEFORE YOUR PROCEDURE         DATE:10/06/09 DAY: WEDNESDAY  1.  Drink clear liquids the entire day-NO SOLID FOOD  2.  Do not drink anything colored red or purple.  Avoid juices with pulp.  No orange juice.  3.  Drink at least 64 oz. (8 glasses) of fluid/clear liquids during the day to prevent dehydration and help the prep work efficiently.  CLEAR LIQUIDS INCLUDE: Water Jello Ice Popsicles Tea (sugar ok, no milk/cream) Powdered fruit flavored drinks Coffee (sugar ok, no milk/cream) Gatorade Juice: apple, white grape, white cranberry  Lemonade Clear bullion, consomm, broth Carbonated beverages (any kind) Strained chicken noodle soup Hard Candy                             4.  In the morning, mix first dose of MoviPrep solution:    Empty 1 Pouch A and 1 Pouch B into the disposable container    Add lukewarm drinking water to the top line of the container. Mix to dissolve    Refrigerate (mixed solution should be used within 24  hrs)  5.  In the morning, you should also use 1 bottle of magnesium citrate (you can get this over the counter in the laxative section of your drugstore)    6.       Begin drinking the prep at 5:00 p.m. The MoviPrep container is divided by 4 marks.   Every 15 minutes drink the solution down to the next mark (approximately 8 oz) until the full liter is complete.   7.  Follow completed prep with 16 oz of clear liquid of your choice (Nothing red or purple).  Continue to drink clear liquids until bedtime.  8.  Before going to bed, mix second dose of MoviPrep solution:    Empty 1 Pouch A and 1 Pouch B into the disposable container    Add lukewarm drinking water to the top line of the container. Mix to dissolve    Refrigerate  THE DAY OF YOUR PROCEDURE      DATE: 10/07/09 DAY: THURSDAY  Beginning at 10:30 a.m. (5 hours before procedure):         1. Every 15 minutes, drink the solution  down to the next mark (approx 8 oz) until the full liter is complete.  2. Follow completed prep with 16 oz. of clear liquid of your choice.    3. You may drink clear liquids until 1:30 PM (2 HOURS BEFORE PROCEDURE).   MEDICATION INSTRUCTIONS  Unless otherwise instructed, you should take regular prescription medications with a small sip of water   as early as possible the morning of your procedure.  Diabetic patients - see separate instructions.  Stop taking Plavix or Aggrenox on  _  _  (7 days before procedure).     Stop taking Coumadin on  _ _  (5 days before procedure).  Additional medication instructions: _         OTHER INSTRUCTIONS  You will need a responsible adult at least 75 years of age to accompany you and drive you home.   This person must remain in the waiting room during your procedure.  Wear loose fitting clothing that is easily removed.  Leave jewelry and other valuables at home.  However, you may wish to bring a book to read or  an iPod/MP3 player to listen to music as you  wait for your procedure to start.  Remove all body piercing jewelry and leave at home.  Total time from sign-in until discharge is approximately 2-3 hours.  You should go home directly after your procedure and rest.  You can resume normal activities the  day after your procedure.  The day of your procedure you should not:   Drive   Make legal decisions   Operate machinery   Drink alcohol   Return to work  You will receive specific instructions about eating, activities and medications before you leave.    The above instructions have been reviewed and explained to me by   _______________________    I fully understand and can verbalize these instructions _____________________________ Date _________

## 2010-03-01 NOTE — Procedures (Signed)
Summary: Colonoscopy   Colonoscopy  Procedure date:  11/11/2003  Findings:      Location:  [facility].  Results: Hemorrhoids.     Results: Diverticulosis.         Procedures Next Due Date:    Colonoscopy: 10/2008 [name] Name: Joseph, [name] MRN: [id number] Procedure Procedures: Colonoscopy CPT: 35573.  Personnel: Endoscopist: Wilhemina Bonito. Marina Goodell, MD.  Exam Location: Exam performed in Outpatient Clinic. Outpatient  [name] Consent: Procedure, Alternatives, Risks and Benefits discussed, consent obtained, from [name]. Consent was obtained by the RN.  Indications  Surveillance of: Adenomatous Polyp(s). This is not an initial surveillance exam. Initial polypectomy was performed in 1997. Pathology of worst  polyp: tubular adenoma. Previous surveillance exam(s) in  2000,  History  Current Medications: [name] is not currently taking Coumadin.  Pre-Exam Physical: Performed Nov 11, 2003. Entire physical exam was normal.  Exam Exam: Extent of exam reached: Terminal Ileum, extent intended: Terminal Ileum.  Joseph cecum was identified by appendiceal orifice and IC valve. [name] position: on left side. Colon retroflexion performed. Images taken. ASA Classification: II. Tolerance: excellent.  Monitoring: Pulse and BP monitoring, Oximetry used. Supplemental O2 given.  Colon Prep Used MIRALAX for colon prep. Prep results: fair, adequate exam.  Sedation Meds: [name] assessed and found to be appropriate for moderate (conscious) sedation. Fentanyl 100 mcg. given IV. Versed 10 mg. given IV. Promethazine 12.5 mg given IV.  Findings NORMAL EXAM: Cecum to Rectum. Comments: MILD XRT PROCTITIS.  - DIVERTICULOSIS: Cecum to Sigmoid Colon. ICD9: Diverticulosis, Colon: 562.10. Comments: SEVERE CHANGES THROUGHOUT COMPROMISISNG EXAM.    Comments: NO POLYPS SEEN Assessment Abnormal examination, see findings above.  Diagnoses: 562.10: Diverticulosis, Colon.  455.0: Hemorrhoids,  Internal.   Events  Unplanned Interventions: No intervention was required.  Unplanned Events: There were no complications. Plans Disposition: After procedure [name] sent to recovery. After recovery [name] sent home.  Scheduling/Referral: Colonoscopy, to Wilhemina Bonito. Marina Goodell, MD, IN 5 YEARS IF MEDICALLY FIT,   Comments: RETURN TO Joseph CARE OF DR. EHINGER  This report was created from Joseph original endoscopy report, which was reviewed and signed by Joseph above listed endoscopist.   cc:  Blair Heys, MD      Joseph [name]

## 2010-03-01 NOTE — Procedures (Signed)
Summary: Colonoscopy   EGD  Procedure date:  11/30/2003  Findings:      Location: [facility]  Findings: Stricture:  GERD  EGD  Procedure date:  11/30/2003  Findings:      Location: [facility]  Findings: Stricture:  GERD Patient Name: Joseph, [name] MRN: [id number] Procedure Procedures: Panendoscopy (EGD) CPT: 43235.    with esophageal dilation. CPT: G9296129.  Personnel: Endoscopist: Wilhemina Bonito. Marina Goodell, MD.  Exam Location: Exam performed in Endoscopy Suite.  Patient Consent: Procedure, Alternatives, Risks and Benefits discussed, consent obtained,  Indications  Therapeutics: Reason for exam: Esophageal dilation.  Symptoms: Dysphagia.  History  Current Medications: Patient is not currently taking Coumadin.  Pre-Exam Physical: Performed Nov 30, 2003  Entire physical exam was normal.  Exam Exam Info: Maximum depth of insertion Duodenum, intended Duodenum. Patient position: on left side. Vocal cords visualized. Gastric retroflexion performed. Images taken. ASA Classification: II. Tolerance: excellent.  Sedation Meds: Demerol 70 mg. given IV. Versed 7 mg. given IV.  Monitoring: BP and pulse monitoring done. Oximetry used. Supplemental O2 given  Fluoroscopy: Fluoroscopy was used.  Findings PRIOR SURGERY: Cardia. Anti-Reflux Surgery.  STRICTURE / STENOSIS: Stricture in Distal Esophagus.  Constriction: partial. Etiology: benign due to reflux. Lumen diameter is 15 mm. ICD9: Esophageal Stricture: 530.3. Comment: esophagus proximal to stricture again found to be dilated.  - Dilation: Distal Esophagus. Procedure was performed under Fluoroscopy. Wire Guided/Savary (Wilson-Cook) dilator used, Diameter: 18 mm, No Resistance, No Heme present on extraction. 1  total dilators used. Patient tolerance excellent.   Assessment Abnormal examination, see findings above.  Diagnoses: 530.3: Esophageal Stricture.  530.81: GERD.   Events  Unplanned  Intervention: No unplanned interventions were required.  Unplanned Events: There were no complications. Plans Instructions: Nothing to eat or drink for 1 hr.  Clear or full liquids: 2 hrs. Resume previous diet: am.  Patient Education: Patient given standard instructions for: Stenosis / Stricture.  Disposition: After procedure patient sent to recovery. After recovery patient sent home.  Scheduling: Follow-up prn.   This report was created from the original endoscopy report, which was reviewed and signed by the above listed endoscopist.   cc:  Blair Heys, MD      The Patient

## 2010-03-01 NOTE — Progress Notes (Signed)
Summary: triage / dyspahagia   Phone Note Call from Patient Call back at Home Phone [phone]   Caller: Patient Call For: Marina Goodell Reason for Call: Talk to Nurse Summary of Call: Patient states that he is having problems swallowing and vomitting. He is scheduled to have a colon on 05-10-09 but wants to know if he needs to see Dr Dustin Folks the office first. (Patient was just in for a nurse visit) Initial call taken by: Tawni Levy,  April 27, 2009 3:58 PM  Follow-up for Phone Call        Pt. started having dysphagia again the past few days.He choked on some chicken soup last eveningAsking want to do is scheduled for colon soon,but wants to know if he needs ov for dysphagia. Follow-up by: Teryl Lucy RN,  April 27, 2009 4:32 PM  Additional Follow-up for Phone Call Additional follow up Details #1::        Nothing to do right now. Let's see how it goes until his colonoscopy Additional Follow-up by: Hilarie Fredrickson MD,  April 27, 2009 4:53 PM    Additional Follow-up for Phone Call Additional follow up Details #2::     Wife ntfd. of Dr.Perry's recommendations. Follow-up by: Teryl Lucy RN,  April 28, 2009 8:49 AM

## 2010-03-01 NOTE — Progress Notes (Signed)
Summary: inadquate oral intake due to dysphagia  Phone Note Other Incoming Call back at [phone]   Caller: Pt's daughter, Fannie Knee Details for Reason: triage Summary of Call: Pt's daughter, Lynnea Ferrier, called to say patient is not doing any better and still cannot keep food down. Please call. Initial call taken by: Schuyler Amor,  December 27, 2009 10:58 AM  Follow-up for Phone Call        Still having dysphagia .Yesterday had Ensure and grits and vomited them back up can tolerate very sm. amt of some liquids but it is far from [place] per his daughter. Asking your opinion as far as a referral to a tertiary center or asking if would it be better to just take him to the Er. of one of the tertiary centers. Follow-up by: Teryl Lucy RN,  December 27, 2009 2:08 PM  Additional Follow-up for Phone Call Additional follow up Details #1::        Would get a barium swallow. If he can not take adequately by mouth, may need a feeding tube placed to assure nutrition. Happy to refer him to tertiary care center if they wish. Additional Follow-up by: Hilarie Fredrickson MD,  December 27, 2009 2:20 PM    Additional Follow-up for Phone Call Additional follow up Details #2::    Pt is calling back to speak with Elnita Maxwell about her fathers barium swallow, you can reach her at the same number Follow-up by: Raechel Chute,  December 27, 2009 2:55 PM  Additional Follow-up for Phone Call Additional follow up Details #3:: Details for Additional Follow-up Action Taken: Pt. scheduled for barium swallow at [facility] on Wed.12/29/09. Is to be N.P.O.3hrs. prior.Pt. aware. Additional Follow-up by: Teryl Lucy RN,  December 27, 2009 3:26 PM

## 2010-03-01 NOTE — Procedures (Signed)
Summary: Esophageal Manometry/[facility]  Esophageal Manometry/[facility]   Imported By: Sherian Rein 03/17/2009 14:58:41  _____________________________________________________________________  External Attachment:    Type:   Image     Comment:   External Document

## 2010-03-01 NOTE — Procedures (Signed)
Summary: Colonoscopy  Patient: Joseph [name] Note: All result statuses are Final unless otherwise noted.  Tests: (1) Colonoscopy (COL)   COL Colonoscopy           DONE     [facility]     [address]     [place], [place]  [address]           COLONOSCOPY PROCEDURE REPORT           PATIENT:  Joseph [name], Joseph [name]  MR#:  [id number]     BIRTHDATE:  1931-11-12, 77 yrs. old  GENDER:  male     ENDOSCOPIST:  Wilhemina Bonito. Eda Keys, MD     REF. BY:  Surveillance Program Recall,     PROCEDURE DATE:  05/10/2009     PROCEDURE:  Colonoscopy with snare polypectomy x 8     Colonoscopy for control of     bleeding with Endo clip     CODE EXTENDED SERVICE MODIFIER     FOR EXTENDED TIME (>50MIN) AND TECHNICALLY DEMANDING           ASA CLASS:  Class II     INDICATIONS:  history of pre-cancerous (adenomatous) colon polyps     ;Index 1997; f/u 2000; 2005     MEDICATIONS:   Fentanyl 100 mcg IV, Versed 10 mg IV           DESCRIPTION OF PROCEDURE:   After the risks benefits and     alternatives of the procedure were thoroughly explained, informed     consent was obtained.  Digital rectal exam was performed and     revealed no abnormalities.   The LB [id number] endoscope     was introduced through the anus and advanced to the cecum, which     was identified by both the appendix and ileocecal valve, limited     by diverticulosis, severe, fair prep. time to cecum = 4:46min.     The quality of the prep was Moviprep fair.  The instrument was then     slowly withdrawn (time = ) as the colon was fully examined.     <<PROCEDUREIMAGES>>           FINDINGS:  There were multiple polyps identified and removed.     Cecum 8mm, ascending colon 29mm,6mm,4mm,4mm, 3mm, ans sigmoid 7mm.     These Polyps were snared without cautery. Retrieval was     successful.  A large, 2.5 -3cm, pedunculated polyp was found in     the sigmoid colon. Polyp was snared, then cauterized with     monopolar cautery. It was  removed in piecemeal fashion. It was     technically demanding because it was in a area of severe     diverticulosis and the prep was poor in this area. Not all     fragments retrieved. Oozing of blood from a broad stalked base     required placement of resolution endo clip for control of     bleeding.  Severe diverticulosis was found throughout the colon.     There were mucosal changes consistent with radiation proctitis seen     in the rectum.   Retroflexed views in the rectum revealed     radiation proctitis.    The scope was then withdrawn from the     patient and the procedure completed.           COMPLICATIONS:  None     ENDOSCOPIC IMPRESSION:  1) Polyps, multiple - removed. SEE ABOVE     2) Pedunculated polyp in the sigmoid colon - removed;     3) Severe diverticulosis throughout the colon     4) Radiation proctitis     5) Control of bleeding with Endo clip placement           RECOMMENDATIONS:     1) Follow up colonoscopy in 3-6 months with more vigorous prep     2) Await pathology           ______________________________     Wilhemina Bonito. Eda Keys, MD           CC:  The Patient           n.     eSIGNED:   Wilhemina Bonito. Eda Keys at 05/10/2009 11:57 AM           Page 2 of 3   Arkeem, Harts Pella, [phone]  Note: An exclamation mark (!) indicates a result that was not dispersed into the flowsheet. Document Creation Date: 05/10/2009 11:58 AM _______________________________________________________________________  (1) Order result status: Final Collection or observation date-time: 05/10/2009 11:29 Requested date-time:  Receipt date-time:  Reported date-time:  Referring Physician:   Ordering Physician: Fransico Setters [phone]) Specimen Source:  Source: Launa Grill Order Number: [id number] Lab site:   Appended Document: Colonoscopy     Procedures Next Due Date:    Colonoscopy: 07/2009

## 2010-03-01 NOTE — Procedures (Signed)
Summary: Upper Endoscopy  Patient: Joseph [name] Note: All result statuses are Final unless otherwise noted.  Tests: (1) Upper Endoscopy (EGD)   EGD Upper Endoscopy       DONE     [facility]     [address]     [place], [place]  [address]           ENDOSCOPY PROCEDURE REPORT           PATIENT:  Joseph, [name]  MR#:  [id number]     BIRTHDATE:  10/26/1931, 77 yrs. old  GENDER:  male           ENDOSCOPIST:  Wilhemina Bonito. Eda Keys, MD     Referred by:  .Direct - Self           PROCEDURE DATE:  02/18/2009     PROCEDURE:  EGD with dilatation over guidewire (18mm)     ASA CLASS:  Class II     INDICATIONS:  dysphagia, dilation of esophageal stricture           MEDICATIONS:   Fentanyl 50 mcg IV, Versed 5 mg IV     TOPICAL ANESTHETIC:  Exactacain Spray           DESCRIPTION OF PROCEDURE:   After the risks benefits and     alternatives of the procedure were thoroughly explained, informed     consent was obtained.  The LB GIF-H180 [id number] endoscope was     introduced through the mouth and advanced to the second portion of     the duodenum, without limitations.  The instrument was slowly     withdrawn as the mucosa was fully examined.     <<PROCEDUREIMAGES>>           The esophagus was dilated w/ pooled debris distally. No     overwhelming stricture noted.Otherwise the examination was normal.     Retroflexed views revealed prior fundoplication.           THERAPY: SAVARY GUIDEWIRE PLACED IN GASTRIC ANTRUM. THIS WAS     CONFIRMED WITH [facility] ENDOSCOPY. SUSEQUENTLY AN SAVARY     DILATOR WAS PASSED W/O RESISTANCE OR HEME. TOLERATED WELL.           COMPLICATIONS:  None           ENDOSCOPIC IMPRESSION:     1) Esophagus as described. S/P     dilation to 18mm. Suspect motility problem     2) Otherwise normal examination     2) Prior fundoplication           RECOMMENDATIONS:     1) Call office next 2-3 days to schedule an office appointment     for 4-6 weeks        ______________________________     Wilhemina Bonito. Eda Keys, MD           CC:  Blair Heys, MD, The Patient           n.     eSIGNED:   Wilhemina Bonito. Eda Keys at 02/18/2009 10:20 AM           Jola Baptist, [id number]  Note: An exclamation mark (!) indicates a result that was not dispersed into the flowsheet. Document Creation Date: 02/18/2009 10:20 AM _______________________________________________________________________  (1) Order result status: Final Collection or observation date-time: 02/18/2009 10:06 Requested date-time:  Receipt date-time:  Reported date-time:  Referring Physician:   Ordering Physician: Fransico Setters [phone]) Specimen Source:  Source: Launa Grill Order Number: [id number] Lab site:

## 2010-03-01 NOTE — Assessment & Plan Note (Signed)
Summary: FOLLOWUP one month    History of Present Illness Visit Type: Follow-up Visit Primary GI MD: Yancey Flemings MD Primary Provider: Blair Heys, MD Requesting Provider: na Chief Complaint: Rectal bleeding, and hard time passing stool History of Present Illness:   Joseph [name] presents today for followup. He is accompanied by his wife. He is a 77 year old with hypertension, renal nephrolithiasis, achalasia status post Botox injection therapy, adenomatous colon polyps, diverticulosis with recent diverticulitis. He has had ongoing difficulties with constipation. Recent bout of acute diverticulitis for which he was last seen in followup August 1. He was given additional antibiotics for an incompletely resolved flare. He has completed those antibiotics with resolution of pain. New complaint today is that of intermittent rectal bleeding. Worse with constipation. His last colonoscopy was in April of 2011. Multiple and large polyps with one lesion demonstrating high-grade dysplasia. Followup in 3-6 months recommended. His chronic medical problems are stable. He continues to swallow well since his Botox injection therapy. No reflux symptoms on Nexium.Marland Kitchen   GI Review of Systems    Reports acid reflux.      Denies abdominal pain, belching, bloating, chest pain, dysphagia with liquids, dysphagia with solids, heartburn, loss of appetite, nausea, vomiting, vomiting blood, weight loss, and  weight gain.      Reports constipation, diverticulosis, and  rectal bleeding.     Denies anal fissure, black tarry stools, change in bowel habit, diarrhea, fecal incontinence, heme positive stool, hemorrhoids, irritable bowel syndrome, jaundice, light color stool, liver problems, and  rectal pain.    Current Medications (verified): 1)  Metoprolol Tartrate 100 Mg Tabs (Metoprolol Tartrate) .Marland Kitchen.. 1 Tablet By Mouth Once Daily 2)  Allergy Relief 4 Mg Tabs (Chlorpheniramine Maleate) .... As Needed 3)  Nexium 40 Mg Cpdr (Esomeprazole  Magnesium) .... Take 1 Tablet By Mouth Once Daily  Allergies (verified): No Known Drug Allergies  Past History:  Past Medical History: Reviewed history from 07/28/2009 and no changes required. Esophageal Stricture/ACHALASIA DX 2011 GERD Diverticulosis Hemorrhoids Colon Polyps Tubular Adenomas multiple Kidney Stones Hypertension  Past Surgical History: Reviewed history from 03/09/2009 and no changes required. Hernia X2 bilateral knee replacement  Family History: Reviewed history from 03/09/2009 and no changes required. Family History of Prostate Cancer: son, brothers X3 No FH of Colon Cancer: Family History of Heart Disease: brothers X18  Social History: Retired Married Patient has never smoked.  Alcohol Use - no Illicit Drug Use - no  Review of Systems       The patient complains of allergy/sinus and fatigue.  The patient denies anemia, anxiety-new, arthritis/joint pain, back pain, blood in urine, breast changes/lumps, change in vision, confusion, cough, coughing up blood, depression-new, fainting, fever, headaches-new, hearing problems, heart murmur, heart rhythm changes, itching, muscle pains/cramps, night sweats, nosebleeds, shortness of breath, skin rash, sleeping problems, sore throat, swelling of feet/legs, swollen lymph glands, thirst - excessive, urination - excessive, urination changes/pain, urine leakage, vision changes, and voice change.    Vital Signs:  Patient profile:   77 year old male Height:      74 inches Weight:      193 pounds BMI:     24.87 BSA:     2.14 Pulse rate:   68 / minute Pulse rhythm:   regular BP sitting:   136 / 82  (left arm) Cuff size:   regular  Vitals Entered By: Ok Anis CMA (September 29, 2009 8:22 AM)  Physical Exam  General:  Well developed, well nourished, no acute distress. Head:  Normocephalic and atraumatic. Eyes:  PERRLA, no icterus. Mouth:  No deformity or lesions Lungs:  Clear throughout to auscultation. Heart:   Regular rate and rhythm; no murmurs, rubs,  or bruits. Abdomen:  mild fullness in the left lower quadrant. Otherwise soft abdomen without tenderness or distention. Good bowel sounds. No organomegaly Rectal:   deferred until colonoscopy Msk:  Symmetrical with no gross deformities. Normal posture. Pulses:  Normal pulses noted. Extremities:  no edema Neurologic:  alert and oriented Skin:  no rash or jaundice Psych:  Alert and cooperative. Normal mood and affect.   Impression & Recommendations:  Problem # 1:  DIVERTICULITIS-COLON (ICD-562.11) recent bout of acute diverticulitis requiring 2 courses of antibiotic therapy. Currently asymptomatic several weeks after completing antibiotics.  Plan: #1. High-fiber diet  #2. Keep bowels regular with MiraLax #3. Contact the office at the first hint of symptoms suggesting recurrent diverticular flare  Problem # 2:  PERSONAL HX COLONIC POLYPS (ICD-V12.72) history of adenomatous colon polyps, including high-grade dysplastic lesion April 2011 as described. Now due for followup. Appropriate candidate without contraindication. The nature of the procedure as well as the risks, benefits, and alternatives of colonoscopy have been reviewed. He understands and agrees to proceed. The patient will need a more vigorous preparation given his suboptimal preparation last examination. I recommended 2 days of clear liquids. As well magnesium citrate the morning before his procedure followed by standard Movi prep. This has been prescribed and the patient has been instructed on its use.  Problem # 3:  RECTAL BLEEDING (ICD-569.3) suspect secondary to hemorrhoids. We will evaluate further at the time of colonoscopy. In addition to fiber, he may need topical hemorrhoid therapies  Problem # 4:  ACHALASIA (ICD-530.0) status post Botox injection therapy. Improve swallowing continues. Continue to follow for evidence of recurrent dysphagia  Problem # 5:  GERD (ICD-530.81) no  active GERD symptoms on Nexium. Continue Nexium and reflux precautions. Status post remote antireflux surgery  Other Orders: Colonoscopy (Colon)  Patient Instructions: 1)  Colonoscopy [facility] 10/07/09 3:30 pm arrive at 2:30 pm 2)  Movi prep instructions given.   3)  Mag. citrate instructions given 4)  Clear liquids 48 hours prior to procedure. 5)  Colonoscopy and Flexible Sigmoidoscopy brochure given.  6)  Copy sent to : Blair Heys, MD 7)  The medication list was reviewed and reconciled.  All changed / newly prescribed medications were explained.  A complete medication list was provided to the patient / caregiver. Prescriptions: MOVIPREP 100 GM  SOLR (PEG-KCL-NACL-NASULF-NA ASC-C) As per prep instructions.  #1 x 0   Entered by:   Milford Cage NCMA   Authorized by:   Hilarie Fredrickson MD   Signed by:   Milford Cage NCMA on 09/29/2009   Method used:   Electronically to        [facility]  [address] [id number] ([facility])       [address]       [place], [place]  [address]       Ph: [phone]       Fax: [phone]   RxID:   [id number]

## 2010-03-01 NOTE — Procedures (Signed)
Summary: Instructions for procedure/[facility] (out pt)  Instructions for procedure/[facility] (out pt)   Imported By: Sherian Rein 03/11/2009 14:48:42  _____________________________________________________________________  External Attachment:    Type:   Image     Comment:   External Document

## 2010-03-01 NOTE — Procedures (Signed)
Summary: Colonoscopy  Patient: Joseph [name] Note: All result statuses are Final unless otherwise noted.  Tests: (1) Colonoscopy (COL)   COL Colonoscopy           [facility]      [facility]     [address]     [place], [place]  [address]           COLONOSCOPY PROCEDURE REPORT           PATIENT:  [name], [name]  MR#:  [id number]     BIRTHDATE:  1931/07/30, 78 yrs. old  GENDER:  male     ENDOSCOPIST:  Wilhemina Bonito. Eda Keys, MD     REF. BY:  Surveillance Program Recall     PROCEDURE DATE:  10/07/2009     PROCEDURE:  Colonoscopy with snare polypectomy x 2     ASA CLASS:  Class II     INDICATIONS:  history of pre-cancerous (adenomatous) colon polyps,     surveillance and high-risk screening ; index 1997 w/ f/u 200,2005,     04-2009. Last exam w/ multiple adenomas including HGD also poor     prep so back now     MEDICATIONS:   Fentanyl 75 mcg IV, Versed 8 mg IV           DESCRIPTION OF PROCEDURE:   After the risks benefits and     alternatives of the procedure were thoroughly explained, informed     consent was obtained.  Digital rectal exam was performed and     revealed no abnormalities.   The LB PCF-[id number] endoscope     was introduced through the anus and advanced to the cecum, which     was identified by both the appendix and ileocecal valve, without     limitations.  The quality of the prep was good, using 2 days     clears, mag citrate, then MoviPrep.  The instrument was then     slowly withdrawn as the colon was fully examined.     <<PROCEDUREIMAGES>>           FINDINGS:  Two polyps were found - 1mm in the cecum and 5mm in     transverse colon. Polyps were snared without cautery. Retrieval     was successful.  Severe diverticulosis was found throughout the     colon with deep folds, innumerable tics, and diverticular stool     casts. There was sigmoid stenosis.  There were mucosal changes     consistent with radiation proctitis seen in the rectum.     Retroflexed  views in the rectum revealed radiation proctitis.    The     scope was then withdrawn from the patient and the procedure     completed.           COMPLICATIONS:  None     ENDOSCOPIC IMPRESSION:     1) Two polyps - removed     2) Severe diverticulosis throughout the colon with sigmoid     stenosis     3) Radiation proctitis           RECOMMENDATIONS:     1) Follow up colonoscopy in 2 years - AGAIN WITH EXTENSIVE PREP     AND PEDS COLONOSCOPE           _____________________________     Wilhemina Bonito. Eda Keys, MD           CC:  Blair Heys, MD; The Patient  n.     eSIGNED:   Wilhemina Bonito. Eda Keys at 10/07/2009 04:41 PM           Jola Baptist, [id number]  Note: An exclamation mark (!) indicates a result that was not dispersed into the flowsheet. Document Creation Date: 10/07/2009 4:41 PM _______________________________________________________________________  (1) Order result status: Final Collection or observation date-time: 10/07/2009 16:26 Requested date-time:  Receipt date-time:  Reported date-time:  Referring Physician:   Ordering Physician: Fransico Setters [phone]) Specimen Source:  Source: Launa Grill Order Number: [id number] Lab site:   Appended Document: Colonoscopy     Procedures Next Due Date:    Colonoscopy: 10/2011

## 2010-03-01 NOTE — Miscellaneous (Signed)
Summary: Lec previsit  Clinical Lists Changes  Medications: Added new medication of MOVIPREP 100 GM  SOLR (PEG-KCL-NACL-NASULF-NA ASC-C) As per prep instructions. - Signed Rx of MOVIPREP 100 GM  SOLR (PEG-KCL-NACL-NASULF-NA ASC-C) As per prep instructions.;  #1 x 0;  Signed;  Entered by: Ulis Rias RN;  Authorized by: Hilarie Fredrickson MD;  Method used: Electronically to [id number] [address] [place], [place], [place]  [address], Ph: [phone], Fax: [phone] Observations: Added new observation of NKA: T (04/27/2009 15:15)    Prescriptions: MOVIPREP 100 GM  SOLR (PEG-KCL-NACL-NASULF-NA ASC-C) As per prep instructions.  #1 x 0   Entered by:   Ulis Rias RN   Authorized by:   Hilarie Fredrickson MD   Signed by:   Ulis Rias RN on 04/27/2009   Method used:   [facility] to        [id number]       [address]       [place], [place]  [address]       Ph: [phone]       Fax: [phone]   RxID:   [id number]

## 2010-03-01 NOTE — Procedures (Signed)
Summary: Upper Endoscopy  Patient: Joseph [name] Note: All result statuses are Final unless otherwise noted.  Tests: (1) Upper Endoscopy (EGD)   EGD Upper Endoscopy       DONE     [facility]     [address] [place], [place]  [address]           ENDOSCOPY PROCEDURE REPORT           PATIENT:  Joseph, [name]  MR#:  [id number]     BIRTHDATE:  [birth year], 78 yrs. old  GENDER:  male           ENDOSCOPIST:  Wilhemina Bonito. Eda Keys, MD     Referred by:  .Direct           PROCEDURE DATE:  12/10/2009     PROCEDURE:  EGD w/botox injection (100 U)     ASA CLASS:  Class II     INDICATIONS:  Botox injection for achalasia for Botox (good     response to initial session; initially no response to last session     but some better now)           MEDICATIONS:   Fentanyl 50 mcg IV, Versed 4 mg IV     TOPICAL ANESTHETIC:  Cetacaine Spray           DESCRIPTION OF PROCEDURE:   After the risks benefits and     alternatives of the procedure were thoroughly explained, informed     consent was obtained.  The EG-[id number] ([id number]) endoscope was     introduced through the mouth and advanced to the first portion of     the duodenum, without limitations.  The instrument was slowly     withdrawn as the mucosa was fully examined.     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  s/p     fundoplication.  Otherwise the examination was normal.     Retroflexed views revealed prior fundoplication.           THERAPY: 25U BOTOX INJECTED INTO EACH OF 4  QUADS 1CM ABOVE Z-LINE     @ 45 DEGREE ANGLE           COMPLICATIONS:  None           ENDOSCOPIC IMPRESSION:     1) Achalasia - S/P BOTOX INJECTION THERAPY     2) S/p fundoplication     3) Otherwise normal examination           RECOMMENDATIONS:     1) follow-up PRN           ______________________________     Wilhemina Bonito. Eda Keys, MD           CC:  The Patient           n.     eSIGNED:   Wilhemina Bonito. Eda Keys at 12/10/2009 01:37 PM           Jola Baptist, [id number]  Note: An exclamation mark (!) indicates a result that was not dispersed into the flowsheet. Document Creation Date: 12/10/2009 1:37 PM _______________________________________________________________________  (1) Order result status: Final Collection or observation date-time: 12/10/2009 13:29 Requested date-time:  Receipt date-time:  Reported date-time:  Referring Physician:   Ordering Physician: Fransico Setters [phone]) Specimen Source:  Source: Launa Grill Order Number: [id number] Lab site:

## 2010-03-01 NOTE — Procedures (Signed)
Summary: Upper Endoscopy  Patient: Joseph [name] Note: All result statuses are Final unless otherwise noted.  Tests: (1) Upper Endoscopy (EGD)   EGD Upper Endoscopy       DONE     [facility]     [address] [place], [place]  [address]           ENDOSCOPY PROCEDURE REPORT           PATIENT:  [name], [name]  MR#:  [id number]     BIRTHDATE:  December 16, 1931, 78 yrs. old  GENDER:  male           ENDOSCOPIST:  Wilhemina Bonito. Eda Keys, MD     Referred by:  Office           PROCEDURE DATE:  11/22/2009     PROCEDURE:  EGD w/botox injection     ASA CLASS:  Class II     INDICATIONS:  Botox injection for achalasia - botox injection     therapy           MEDICATIONS:   Fentanyl 25 mcg, Versed 2.5 mg     TOPICAL ANESTHETIC:  Cetacaine Spray           DESCRIPTION OF PROCEDURE:   After the risks benefits and     alternatives of the procedure were thoroughly explained, informed     consent was obtained.  The Pentax Gastroscope [id number] endoscope     was introduced through the mouth and advanced to the second     portion of the duodenum, without limitations.  The instrument was     slowly withdrawn as the mucosa was fully examined.     <<PROCEDUREIMAGES>>           Dilated atonic esophagus with pooled debris c/w Achalasia of the     esophagus.  Post-operative change was noted in the stomach.     Retroflexed views revealed prior fundoplication. Otherwise normal     exam to D2   The scope was then withdrawn from the patient and the     procedure completed.           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 Eda Keys, MD           CC:  Blair Heys, MD, The Patient           n.     eSIGNED:   Wilhemina Bonito. Eda Keys at 11/22/2009 09:38 AM           Jola Baptist, [id number]  Note: An exclamation mark (!) indicates a result that was not dispersed into the flowsheet. Document Creation Date: 11/22/2009 9:39 AM _______________________________________________________________________  (1) Order result status: Final Collection or observation date-time: 11/22/2009 09:31 Requested date-time:  Receipt date-time:  Reported date-time:  Referring Physician:   Ordering Physician: Fransico Setters [phone]) Specimen Source:  Source: Launa Grill Order Number: [id number] Lab site:

## 2010-03-01 NOTE — Progress Notes (Signed)
Summary: Triage / Manometry Scheduled   Phone Note Call from Patient Call back at Home Phone [phone]   Caller: spouse Britta Mccreedy Call For: Dr. Marina Goodell Reason for Call: Talk to Nurse Summary of Call: Pt. cannot swallow and feels like he is "choking". Had a CT Scan this morning. Initial call taken by: Karna Christmas,  February 26, 2009 11:27 AM  Follow-up for Phone Call        CT results reviewed w/pt. wife. She states pt. is unable to swallow anything today, he has tried very thin oatmeal, juice,water & hot chocolate--nothing will go down, he gags/vomits it back up. Today is the worse.  Pt. advised to be NPO. I will callback as soon as Dr.Perry advises.  DR.PERRY PLEASE REVIEW AND ADVISE  Follow-up by: Laureen Ochs LPN,  February 26, 2009 11:38 AM  Additional Follow-up for Phone Call Additional follow up Details #1::        1.NEEDS TO PUSH LIQUIDS TO MAINTAIN HYDRATION 2. SCHEDULE ESOPHAGEAL MANOMETRY AT HOSPITAL ASAP 3. IF DEHYDRATED OR CONCERNED, HE CAN GO TO ER FOR FLUIDS Additional Follow-up by: Hilarie Fredrickson MD,  February 26, 2009 2:04 PM    Additional Follow-up for Phone Call Additional follow up Details #2::    Above MD orders reviewed with patient's wife. He is scheduled for a Manometry at [facility] on 03-02-09 at 1pm. All instructions reviewed w/Mrs.Benna Dunks. Pt. instructed to call back as needed.  Follow-up by: Laureen Ochs LPN,  February 26, 2009 2:43 PM

## 2010-03-01 NOTE — Progress Notes (Signed)
Summary: Reschedule CT pending insurance approval   Phone Note Outgoing Call Call back at [facility] Phone [phone]   Call placed by: Darcey Nora RN, CGRN,  February 25, 2009 3:37 PM Call placed to: Rose at CT Summary of Call: I spoke with Rose at CT advising her that CT scan won't be approved before his appointment tomorrow am and that I have been unable to reach to patient.  Rose had already spoken with the patient and he understands that his insurance hasn't approved the CT scan yet and that it may not get approved.  Due to patient's dysphagia, he wants to proceed with the CT scan without ins approval.  He understands that if they don't approve this he will be responsible for the bill. Initial call taken by: Darcey Nora RN, CGRN,  February 25, 2009 3:40 PM

## 2010-03-01 NOTE — Procedures (Signed)
Summary: Instructions for procedure/[facility]  Instructions for procedure/   Imported By: Sherian Rein 11/22/2009 14:32:42  _____________________________________________________________________  External Attachment:    Type:   Image     Comment:   External Document

## 2010-03-01 NOTE — Miscellaneous (Signed)
Summary: CT of chest  Clinical Lists Changes  Problems: Added new problem of NONSPECIFIC ABN FINDING RAD & OTH EXAM GI TRACT (ICD-793.4) Orders: Added new Test order of GI Misc Procedure/ Radiology Order (GI Misc ) - Signed

## 2010-03-01 NOTE — Progress Notes (Signed)
Summary: tertiary referral  Phone Note Call from Patient   Caller: [phone] Call For: Dr Marina Goodell Summary of Call: Says pt needs to hear back from office today, left message friday and has not heard back. Initial call taken by: Leanor Kail [facility],  January 04, 2010 3:23 PM  Follow-up for Phone Call        Name of Dr.on  Dr.Perry's review.I discussed the barium swallow with her and she can not understand that if barium got thru then why is he still throwing up.She would like to know who Dr. Marina Goodell rec. by tomorrow morning or if he does not rec. anyone special does he know of Dr.Kim Isaacs or Dr.Ian Josetta Huddle and  would he rec. either one of them.? Follow-up by: Teryl Lucy RN,  January 04, 2010 4:43 PM  Additional Follow-up for Phone Call Additional follow up Details #1::        Dr. Erma Heritage is an IBD expert and I'm not familiar with Dr. Josetta Huddle. Call [facility] and find out who is their expect in esophageal disorders and refer him to that person. thanks Additional Follow-up by: Hilarie Fredrickson MD,  January 05, 2010 8:08 AM    Additional Follow-up for Phone Call Additional follow up Details #2::    [facility] gastroenterology contacted for referral for dr. who specializes in esophageal disorders. They are faxing  form for the referral and will contact us as soon as appt. made. Teryl Lucy RN  January 05, 2010 9:00 AM    Referral info faxed to [facility]. Follow-up by: Teryl Lucy RN,  January 05, 2010 11:54 AM

## 2010-03-01 NOTE — Procedures (Signed)
Summary: EGD   EGD  Procedure date:  11/11/2003  Findings:      Location: [facility]  Findings: Stricture:  GERD Patient Name: Joseph [name], Joseph [name] MRN: [id number] Procedure Procedures: Panendoscopy (EGD) CPT: 43235.  Personnel: Endoscopist: Wilhemina Bonito. Marina Goodell, MD.  Exam Location: Exam performed in Outpatient Clinic. Outpatient  Patient Consent: Procedure, Alternatives, Risks and Benefits discussed, consent obtained, from patient. Consent was obtained by the RN.  Indications Symptoms: Dysphagia. Reflux symptoms  History  Current Medications: Patient is not currently taking Coumadin.  Comments: S/P FUNDOPLICATION X 2 Pre-Exam Physical: Performed Nov 11, 2003  Entire physical exam was normal.  Exam Exam Info: Maximum depth of insertion Duodenum, intended Duodenum. Patient position: on left side. Vocal cords visualized. Gastric retroflexion performed. Images taken. ASA Classification: II. Tolerance: excellent.  Sedation Meds: Patient assessed and found to be appropriate for moderate (conscious) sedation. Residual sedation present from prior procedure today.  Monitoring: BP and pulse monitoring done. Oximetry used. Supplemental O2 given  Findings PRIOR SURGERY: Cardia. Anti-Reflux Surgery.  STRICTURE / STENOSIS: Stricture in Distal Esophagus.  Constriction: partial. Etiology: benign due to reflux. Lumen diameter is 16 mm. ICD9: Esophageal Stricture: 530.3. Comment: ESOPHAGUS DILATED PROXIMAL TO EGJ. NOT DILATED W/O XRAY AND GUIDEWIRE.  - Normal: Fundus to Duodenal Apex.   Assessment Abnormal examination, see findings above.  Diagnoses: 530.3: Esophageal Stricture.  530.81: GERD.   Events  Unplanned Intervention: No unplanned interventions were required.  Unplanned Events: There were no complications. Plans Medication(s): Continue current medications.  Disposition: After procedure patient sent to recovery. After recovery patient sent home.    Scheduling: Follow-up prn.   This report was created from the original endoscopy report, which was reviewed and signed by the above listed endoscopist.   cc:  Blair Heys, MD      The Patient

## 2010-03-01 NOTE — Procedures (Signed)
Summary: EGD/[facility]  EGD/[facility]   Imported By: Sherian Rein 03/10/2009 10:35:20  _____________________________________________________________________  External Attachment:    Type:   Image     Comment:   External Document

## 2010-03-03 NOTE — Progress Notes (Signed)
Summary: Questions  Phone Note Call from Patient Call back at [phone]   Caller: Patients daughter Call For: Dr.Perry Reason for Call: Talk to Nurse Summary of Call: Elnita Maxwell sent records to [facility] for pt to be seen and when daughter called them about appt she found out that he is not going to see someone who does procedures and she is confused as to why she needs to see someone elsewhen  Dr.Perry has already diagnosed her father Initial call taken by: [place] Johnson,  January 17, 2010 2:41 PM  Follow-up for Phone Call        Call daughter at for a few daysis number [phone] Follow-up by: [place] Johnson,  January 17, 2010 3:32 PM  Additional Follow-up for Phone Call Additional follow up Details #1::        I spoke with the patient's daughter and explained that Dr Marina Goodell wants her to see an esophageal specialist at [facility].  She will keep the appointment with Dr Pamala Hurry on 12/30.  Patient's daughter then relays after 15 minutes she is on the way to the ER with him for persistent vomiting for a few days.  I have relayed the above to Mike Gip PA she says ER can evaluate and call us if needed.  Dr Arlyce Dice also notified he is the hospital MD Additional Follow-up by: Darcey Nora RN, CGRN,  January 17, 2010 4:23 PM

## 2010-03-08 ENCOUNTER — Encounter: Payer: Self-pay | Admitting: Internal Medicine

## 2010-03-29 NOTE — Letter (Signed)
Summary: Cardiothoracic/[facility]   Imported By: Sherian Rein 03/21/2010 11:54:15  _____________________________________________________________________  External Attachment:    Type:   Image     Comment:   External Document

## 2010-04-04 ENCOUNTER — Observation Stay (HOSPITAL_COMMUNITY)
Admission: EM | Admit: 2010-04-04 | Discharge: 2010-04-05 | Disposition: A | Discharge status: Home / Self Care | Payer: Medicare Other | Attending: Emergency Medicine | Admitting: Emergency Medicine

## 2010-04-04 ENCOUNTER — Emergency Department (HOSPITAL_COMMUNITY): Payer: Medicare Other

## 2010-04-04 DIAGNOSIS — I10 Essential (primary) hypertension: Secondary | ICD-10-CM | POA: Insufficient documentation

## 2010-04-04 DIAGNOSIS — Z01812 Encounter for preprocedural laboratory examination: Secondary | ICD-10-CM | POA: Insufficient documentation

## 2010-04-04 DIAGNOSIS — K219 Gastro-esophageal reflux disease without esophagitis: Secondary | ICD-10-CM | POA: Insufficient documentation

## 2010-04-04 DIAGNOSIS — I517 Cardiomegaly: Secondary | ICD-10-CM | POA: Insufficient documentation

## 2010-04-04 DIAGNOSIS — Z8546 Personal history of malignant neoplasm of prostate: Secondary | ICD-10-CM | POA: Insufficient documentation

## 2010-04-04 DIAGNOSIS — R109 Unspecified abdominal pain: Secondary | ICD-10-CM | POA: Insufficient documentation

## 2010-04-04 DIAGNOSIS — R110 Nausea: Secondary | ICD-10-CM | POA: Insufficient documentation

## 2010-04-04 DIAGNOSIS — N201 Calculus of ureter: Principal | ICD-10-CM | POA: Insufficient documentation

## 2010-04-04 DIAGNOSIS — Z01810 Encounter for preprocedural cardiovascular examination: Secondary | ICD-10-CM | POA: Insufficient documentation

## 2010-04-04 DIAGNOSIS — N12 Tubulo-interstitial nephritis, not specified as acute or chronic: Secondary | ICD-10-CM | POA: Insufficient documentation

## 2010-04-04 DIAGNOSIS — Z79899 Other long term (current) drug therapy: Secondary | ICD-10-CM | POA: Insufficient documentation

## 2010-04-04 LAB — URINALYSIS, ROUTINE W REFLEX MICROSCOPIC
Bilirubin Urine: NEGATIVE
Glucose, UA: 100 mg/dL — AB
Protein, ur: 30 mg/dL — AB

## 2010-04-04 LAB — CBC
Platelets: 194 10*3/uL (ref 150–400)
RBC: 4.72 MIL/uL (ref 4.22–5.81)
RDW: 12.8 % (ref 11.5–15.5)
WBC: 11.7 10*3/uL — ABNORMAL HIGH (ref 4.0–10.5)

## 2010-04-04 LAB — DIFFERENTIAL
Basophils Absolute: 0 10*3/uL (ref 0.0–0.1)
Eosinophils Absolute: 0.1 10*3/uL (ref 0.0–0.7)
Eosinophils Relative: 1 % (ref 0–5)
Lymphocytes Relative: 6 % — ABNORMAL LOW (ref 12–46)
Neutrophils Relative %: 82 % — ABNORMAL HIGH (ref 43–77)

## 2010-04-04 LAB — URINE MICROSCOPIC-ADD ON

## 2010-04-05 ENCOUNTER — Emergency Department (HOSPITAL_COMMUNITY): Payer: Medicare Other

## 2010-04-05 ENCOUNTER — Encounter (HOSPITAL_COMMUNITY): Payer: Self-pay

## 2010-04-05 LAB — BASIC METABOLIC PANEL
GFR calc non Af Amer: 48 mL/min — ABNORMAL LOW (ref >60)
Potassium: 4.1 mEq/L (ref 3.5–5.1)
Sodium: 137 mEq/L (ref 135–145)

## 2010-04-06 LAB — URINE CULTURE: Culture  Setup Time: 201203060513

## 2010-04-11 LAB — HEPATIC FUNCTION PANEL
Alkaline Phosphatase: 66 U/L (ref 39–117)
Indirect Bilirubin: 0.4 mg/dL (ref 0.3–0.9)
Total Protein: 6 g/dL (ref 6.0–8.3)

## 2010-04-11 LAB — DIFFERENTIAL
Eosinophils Absolute: 0.1 10*3/uL (ref 0.0–0.7)
Eosinophils Relative: 3 % (ref 0–5)
Lymphs Abs: 1.8 10*3/uL (ref 0.7–4.0)

## 2010-04-11 LAB — URINALYSIS, ROUTINE W REFLEX MICROSCOPIC
Ketones, ur: NEGATIVE mg/dL
Nitrite: NEGATIVE
Protein, ur: NEGATIVE mg/dL
Specific Gravity, Urine: 1.024 (ref 1.005–1.030)
Urobilinogen, UA: 1 mg/dL (ref 0.0–1.0)

## 2010-04-11 LAB — BASIC METABOLIC PANEL
BUN: 10 mg/dL (ref 6–23)
CO2: 27 mEq/L (ref 19–32)
Chloride: 106 mEq/L (ref 96–112)
Creatinine, Ser: 0.99 mg/dL (ref 0.4–1.5)
Glucose, Bld: 81 mg/dL (ref 70–99)

## 2010-04-11 LAB — CBC
MCH: 30.8 pg (ref 26.0–34.0)
MCV: 89.6 fL (ref 78.0–100.0)
Platelets: 155 10*3/uL (ref 150–400)
RBC: 4.8 MIL/uL (ref 4.22–5.81)
RDW: 12.7 % (ref 11.5–15.5)

## 2010-04-11 LAB — LIPASE, BLOOD: Lipase: 19 U/L (ref 11–59)

## 2010-04-14 ENCOUNTER — Ambulatory Visit (HOSPITAL_COMMUNITY)
Admission: RE | Admit: 2010-04-14 | Discharge: 2010-04-14 | Disposition: A | Discharge status: Home / Self Care | Payer: Medicare Other | Source: Ambulatory Visit | Attending: Urology | Admitting: Urology

## 2010-04-14 DIAGNOSIS — Z96659 Presence of unspecified artificial knee joint: Secondary | ICD-10-CM | POA: Insufficient documentation

## 2010-04-14 DIAGNOSIS — I10 Essential (primary) hypertension: Secondary | ICD-10-CM | POA: Insufficient documentation

## 2010-04-14 DIAGNOSIS — N200 Calculus of kidney: Secondary | ICD-10-CM | POA: Insufficient documentation

## 2010-04-14 DIAGNOSIS — N201 Calculus of ureter: Secondary | ICD-10-CM | POA: Insufficient documentation

## 2010-04-18 LAB — URINALYSIS, ROUTINE W REFLEX MICROSCOPIC
Bilirubin Urine: NEGATIVE
Glucose, UA: 100 mg/dL — AB
Ketones, ur: NEGATIVE mg/dL
Specific Gravity, Urine: 1.016 (ref 1.005–1.030)
pH: 7 (ref 5.0–8.0)

## 2010-04-18 LAB — COMPREHENSIVE METABOLIC PANEL
ALT: 13 U/L (ref 0–53)
AST: 19 U/L (ref 0–37)
Calcium: 9.2 mg/dL (ref 8.4–10.5)
GFR calc Af Amer: >60 mL/min (ref >60)
Sodium: 138 mEq/L (ref 135–145)
Total Protein: 6.9 g/dL (ref 6.0–8.3)

## 2010-04-18 LAB — CBC
MCHC: 32.8 g/dL (ref 30.0–36.0)
Platelets: 189 10*3/uL (ref 150–400)
RBC: 4.99 MIL/uL (ref 4.22–5.81)
RDW: 13.4 % (ref 11.5–15.5)

## 2010-04-18 LAB — DIFFERENTIAL
Eosinophils Absolute: 0.1 10*3/uL (ref 0.0–0.7)
Eosinophils Relative: 2 % (ref 0–5)
Lymphocytes Relative: 20 % (ref 12–46)
Lymphs Abs: 1 10*3/uL (ref 0.7–4.0)
Monocytes Relative: 12 % (ref 3–12)

## 2010-04-18 NOTE — Op Note (Signed)
  Joseph [name], Joseph [name]              ACCOUNT NO.:  [id number]  MEDICAL RECORD NO.:  [id number]           PATIENT TYPE:  E  LOCATION:  WLED                         FACILITY:  [facility]  PHYSICIAN:  Braven Wolk I. Patsi Sears, M.D.DATE OF BIRTH:  January 24, 1932  DATE OF PROCEDURE:  04/05/2010 DATE OF DISCHARGE:  04/05/2010                              OPERATIVE REPORT   TIME:  0405  PREOPERATIVE DIAGNOSIS:  Left upper ureteral calculus with pyelonephritis.  POSTOPERATIVE DIAGNOSIS:  Left upper ureteral calculus with pyelonephritis.  OPERATIONS:  Cystourethroscopy, left retrograde pyelogram with interpretation, left double-J stent (6 x 26).  PREPARATION:  After appropriate preanesthesia, the patient was brought to the operating room, placed on the operating room in dorsal supine position where general LMA anesthesia was introduced.  He was then replaced in dorsal lithotomy position where the pubis was prepped with Betadine solution and draped in usual fashion.  REVIEW OF HISTORY:  [name] is a 77 year old male, patient of Dr. Barron Alvine, presenting to the emergency room with left flank pain times several days, associated with nausea, vomiting, fever, and chills. The patient has unremitting pain of 10/10 despite treatment in the emergency room repeatedly for pain.  The pain radiates from his left flank to his left lower quadrant.  He has no dysuria.  He did have no definite fever or chills at home but has in the emergency room.  His past history is significant for hypertension.  He appears with CT scan showing a 7-mm proximal left ureteral stone with hydronephrosis and perinephric stranding.  He is with creatinine 1.42, with urinalysis with leukocyte esterase positive and moderate nitrates.  He is given Rocephin and now is for double-J stent placement.  PROCEDURE:  Cystourethroscopy was accomplished and left retrograde pyelogram was accomplished which showed a stone in the upper ureter  and dilated ureter above it.  The stone was manipulated in the renal pelvis. A 6 x 26 double-J stent was placed into the renal pelvis and coiled the renal pelvis in the bladder.  The patient tolerated the procedure well.  The bladder was drained of fluid, and the patient was awakened and taken to the recovery room in good condition.  He was not given Toradol because of his elevated creatinine.     Rosemaria Inabinet I. Patsi Sears, M.D.     SIT/MEDQ  D:  04/05/2010  T:  04/05/2010  Job:  [id number]  cc:   Valetta Fuller, M.D. Fax: [phone]  Electronically Signed by Jethro Bolus M.D. on 04/18/2010 12:34:54 PM

## 2010-04-18 NOTE — H&P (Signed)
  NAMESARVESH, MEDDAUGH              ACCOUNT NO.:  [id number]  MEDICAL RECORD NO.:  [id number]           PATIENT TYPE:  E  LOCATION:  WLED                         FACILITY:  [facility]  PHYSICIAN:  Felesia Stahlecker I. Patsi Sears, M.D.DATE OF BIRTH:  [birth year]  DATE OF ADMISSION:  04/04/2010 DATE OF DISCHARGE:  04/05/2010                             HISTORY & PHYSICAL   TIME:  04:15  HISTORY:  Mr. Yearby is a 77 year old male with left flank pain for several days, nausea, vomiting, fever.  The pain is unremitting, with a pain level of 10/10 despite medications in the emergency room.  He has had CT scan showing a 7-mm proximal left ureteral calculus with hydronephrosis and perinephric stranding.  His past history is significant for hypertension.  Tobacco, none.  Alcohol, none.  FAMILY HISTORY:  Noncontributory.  SOCIAL HISTORY:  Noncontributory.  DRUG ALLERGIES:  None.  MEDICATIONS:  Include: 1. Nexium. 2. Zofran. 3. Roxicet.  REVIEW OF SYSTEMS:  Constitutional review of systems is significant for nausea, vomiting and flank pain.  The patient has no other contributory review of systems.  PHYSICAL EXAMINATION:  GENERAL:  Shows a well-developed, ill-appearing white male, in moderate distress. VITAL SIGNS:  Blood pressure 134/83, pulse 112, respiratory rate 20, temperature 98.2. NECK:  Supple, nontender. CHEST:  Clear to P and A. ABDOMEN:  Soft, decreasing bowel sounds without organomegaly or masses. There is left upper quadrant left CVA pain to palpation. GENITOURINARY:  Shows normal penis, normal urethra, normal glans. Testicles are descended bilaterally. EXTREMITIES:  No cyanosis, no edema.  IMPRESSION:  Acute on chronic left hydronephrosis and pain from a 7-mm stone in the left upper ureter.  The patient is having significant infection as well.  He is covered with Rocephin and will have double-J stent placed.  He will be followed up by Dr. Isabel Caprice for treatment of  his stone.     Josephina Melcher I. Patsi Sears, M.D.     SIT/MEDQ  D:  04/05/2010  T:  04/05/2010  Job:  [id number]  cc:   Valetta Fuller, M.D. Fax: [phone]  Electronically Signed by Jethro Bolus M.D. on 04/18/2010 12:34:56 PM

## 2010-04-19 LAB — HEMOGLOBIN AND HEMATOCRIT, BLOOD
HCT: 40.5 % (ref 39.0–52.0)
HCT: 41.8 % (ref 39.0–52.0)
Hemoglobin: 14 g/dL (ref 13.0–17.0)

## 2010-04-19 NOTE — Letter (Signed)
Summary: Ardelle Park MD/[facility]  [facility] Park MD/[facility]   Imported By: Lester Earl 04/11/2010 09:16:30  _____________________________________________________________________  External Attachment:    Type:   Image     Comment:   External Document

## 2010-04-20 LAB — DIFFERENTIAL
Basophils Relative: 0 % (ref 0–1)
Eosinophils Absolute: 0.1 10*3/uL (ref 0.0–0.7)
Eosinophils Relative: 2 % (ref 0–5)
Lymphs Abs: 1.2 10*3/uL (ref 0.7–4.0)
Monocytes Relative: 11 % (ref 3–12)

## 2010-04-20 LAB — COMPREHENSIVE METABOLIC PANEL
ALT: 15 U/L (ref 0–53)
AST: 21 U/L (ref 0–37)
Alkaline Phosphatase: 78 U/L (ref 39–117)
CO2: 26 mEq/L (ref 19–32)
Calcium: 9.3 mg/dL (ref 8.4–10.5)
GFR calc Af Amer: >60 mL/min (ref >60)
GFR calc non Af Amer: >60 mL/min (ref >60)
Glucose, Bld: 103 mg/dL — ABNORMAL HIGH (ref 70–99)
Potassium: 4 mEq/L (ref 3.5–5.1)
Sodium: 139 mEq/L (ref 135–145)
Total Protein: 7.1 g/dL (ref 6.0–8.3)

## 2010-04-20 LAB — HEMOGLOBIN AND HEMATOCRIT, BLOOD
HCT: 41.5 % (ref 39.0–52.0)
HCT: 42 % (ref 39.0–52.0)
Hemoglobin: 13.7 g/dL (ref 13.0–17.0)
Hemoglobin: 14 g/dL (ref 13.0–17.0)

## 2010-04-20 LAB — CBC
HCT: 46.1 % (ref 39.0–52.0)
MCHC: 33.3 g/dL (ref 30.0–36.0)
MCV: 92.2 fL (ref 78.0–100.0)
Platelets: 178 10*3/uL (ref 150–400)
RDW: 13.4 % (ref 11.5–15.5)
WBC: 5.9 10*3/uL (ref 4.0–10.5)

## 2010-04-20 LAB — TYPE AND SCREEN

## 2010-04-20 LAB — PREPARE RBC (CROSSMATCH)

## 2010-05-06 LAB — BASIC METABOLIC PANEL
BUN: 10 mg/dL (ref 6–23)
CO2: 26 mEq/L (ref 19–32)
Chloride: 108 mEq/L (ref 96–112)
Creatinine, Ser: 0.95 mg/dL (ref 0.4–1.5)
Glucose, Bld: 127 mg/dL — ABNORMAL HIGH (ref 70–99)

## 2010-06-14 NOTE — Assessment & Plan Note (Signed)
[facility]                         GASTROENTEROLOGY OFFICE NOTE   ROMOLO, SIELING                     MRN:          [id number]  DATE:02/06/2007                            DOB:          1931/04/01    HISTORY:  Mr. Carbonell presents today for followup.  He is a 77 year old  with a history of gastroesophageal reflux disease complicated by peptic  stricture, adenomatous colon polyps, severe diverticulosis, and a  history of prostate cancer.  He was recently evaluated in the office  January 15, 2007 for poorly controlled bowel habits of 2 months  duration.  See that dictation for details.  He was treated with the  probiotic Align for 2 weeks, as well Librax before meals.  He presents  now for followup.  After about 1 week the patient's symptoms improved  dramatically.  He has completed Librarian, academic.  He continues to take Librax 15  minutes before meals.  Currently reports 2 well-formed bowel movements  daily without significant urgency or incontinence.  No other symptoms or  problems.  No appreciable medication side effects.  His medications are  Toprol, Quinapril, and Align, as well as Pepcid AC.   PHYSICAL EXAMINATION:  Finds a well-appearing male in no acute distress.  Blood pressure is 142/84.  Heart rate is 76.  Weight is 201.4 pounds.  ABDOMEN:  His abdomen is soft, without masses, or hernia.  Good bowel  sounds heard.   IMPRESSION:  Recent problems with postprandial cramping, urgency, and  loose stools.  Some incontinence.  Possibly due to spasm.  Symptoms  improved after a short course of Align and ongoing premeal treatment  with Librax.   RECOMMENDATIONS:  Continue Librax before meals for now.  If symptoms  remain controlled, then he may try to titrate the drug to the lowest  dose to control symptoms.     Wilhemina Bonito. Marina Goodell, MD  Electronically Signed    JNP/MedQ  DD: 02/06/2007  DT: 02/06/2007  Job #: [id number]   cc:   Valetta Fuller, M.D.  Bryan Lemma. Manus Gunning, M.D.

## 2010-06-14 NOTE — Assessment & Plan Note (Signed)
[facility]                         GASTROENTEROLOGY OFFICE NOTE   NAME:Joseph [name], Joseph [name]                     MRN:          [id number]  DATE:01/15/2007                            DOB:          08/31/1931    OFFICE CONSULTATION:   REASON FOR CONSULTATION:  Poorly-controlled bowel habits.   HISTORY:  This is a 77 year old white male with a history of  gastroesophageal reflux disease complicated by peptic stricture,  adenomatous colon polyps, severe diverticulosis, and history of prostate  cancer.  He was last evaluated in this office in October 2005 for  dysphagia and a history of colon polyps.  Upper endoscopy was performed.  Esophageal dilation was performed.  Colonoscopy revealed diverticulosis  with a suboptimal prep.  The patient has not been seen since that time.  He is accompanied today by his wife.  They report a 29-month history of  change in bowel habits.  In particularly he has postprandial urgency  with somewhat loose stools.  Problems have been worse over the past 2  months and in particular over the past 3 weeks.  He has had incontinent  episodes.  He denies bleeding or weight loss.  There is some transient  cramping prior to his urgency, which is promptly relieved with  defecation.  No nocturnal symptoms.   PAST MEDICAL HISTORY:  As above.   CURRENT MEDICATIONS:  1. Metoprolol 100 mg daily.  2. Quinapril 500 mg daily.   ALLERGIES:  No known drug allergies.   FAMILY HISTORY:  Negative for gastrointestinal malignancy.   SOCIAL HISTORY:  The patient is married, accompanied by his wife.  Does  not smoke or use alcohol.   REVIEW OF SYSTEMS:  Per diagnostic evaluation form.   PHYSICAL EXAM:  A well-appearing male in no acute distress.  Blood pressure is 140/86, heart rate 64, weight is 196.6 pounds.  He is  6 feet 1 inch in height.  HEENT:  Sclerae are anicteric.  Conjunctivae are pink.  Oral mucosa is  intact, no adenopathy.  LUNGS:  Clear.  HEART:  Regular.  ABDOMEN:  Soft without tenderness, mass or hernia.  RECTAL:  No external abnormalities.  There is mildly diminished rectal  tone.  No mass or tenderness.  Stool is formed, brown, Hemoccult-  negative.  EXTREMITIES:  Without edema.   IMPRESSION:  A 34-month history of change in bowel habits marked by  postprandial cramping with urgency and loose, occasionally incontinent,  stools.  No worrisome features by history such as pain, weight loss or  bleeding.  He does have severe diverticulosis and this may be spasm.  Also, mildly decreased tone noted on rectal exam.   RECOMMENDATIONS:  1. Empiric trial of the probiotic Align one daily for 2 weeks.  2. Librax one before meals.  3. Encouraged to purchase and use Depends protective undergarments.  4. Routine office follow-up in 4 weeks to assess response to      therapies.  We did briefly discuss the possible role of follow-up      colonoscopy but elected at this time to treat empirically and  assess for response.     Wilhemina Bonito. Marina Goodell, MD  Electronically Signed    JNP/MedQ  DD: 01/15/2007  DT: 01/16/2007  Job #: [id number]   cc:   Valetta Fuller, M.D.  Bryan Lemma. Manus Gunning, M.D.

## 2010-06-17 NOTE — Op Note (Signed)
NAMEJUSTINO, BOZE                        ACCOUNT NO.:  [id number]   MEDICAL RECORD NO.:  [id number]                   PATIENT TYPE:  INP   LOCATION:  2899                                 FACILITY:  [facility]   PHYSICIAN:  Stefani Dama, M.D.               DATE OF BIRTH:  09-13-1931   DATE OF PROCEDURE:  01/01/2003  DATE OF DISCHARGE:                                 OPERATIVE REPORT   PREOPERATIVE DIAGNOSIS:  Cervical spondylosis with right-sided cervical  radiculopathy, C3-4, C4-5 and C5-6.   POSTOPERATIVE DIAGNOSIS:  Cervical spondylosis with right-sided cervical  radiculopathy, C3-4, C4-5 and C5-6.   OPERATION PERFORMED:  Anterior cervical decompression, arthrodesis with  structural allograft, C3-4, C4-5 and C5-6.  Synthes fixation C3 to C6.   SURGEON:  Stefani Dama, M.D.   ASSISTANT:  Clydene Fake, M.D.   ANESTHESIA:  General endotracheal.   INDICATIONS FOR PROCEDURE:  The patient is a 77 year old individual who has  had significant neck, shoulder and right arm pain and weakness.  He had  previously been evaluated for spondylitic disease and was advised regarding  conservative therapy and becoming increasingly weak on that left side and  the patient is now taken to the operating room to undergo surgical  decompression and stabilization of C3 down to C6.   DESCRIPTION OF PROCEDURE:  The patient was brought to the operating room and  placed on the table in supine position.  After smooth induction of general  endotracheal anesthesia, he was placed in five pounds of halter traction,  the neck was shaved, prepped with DuraPrep and draped in a sterile fashion.  A transverse incision was made in the left side of the neck and carried down  to the platysma.  The plane between the sternocleidomastoid and the strap  muscles was dissected bluntly until the prevertebral space was reached.  The  first identifiable disk space was noted on radiography to be at C3-C4.  Dissection  was then carried down to C5-C6 and the longus colli muscle was  stripped off of either side of midline to allow placement of a Caspar  retractor.  The anterior aspect of the disk space at C3-4 was then opened  with a 15 blade and a combination of Kerrison rongeurs was used to evacuate  a significant quantity of severely degenerated disk material.  Ventral  osteophytes were also removed using a rongeur.  As the disk space was  entered, a self-retaining disk spreader was placed in the wound and the  lateral aspect of the disk space was cleared out using a high speed air  drill and 2.2 mm dissecting tool.  The dissection was carried out laterally  to expose the foramen on the right side.  There was noted to be a large  uncinate process spur and this was drilled down with a 2.2 mm dissecting  tool.  When the spur was released, the subligamentous  tissue was then  released and the ligament was opened to expose the take off of the C3 nerve  root.  Similar dissection was then carried out on the left side where a much  smaller osteophytic spur was encountered.  At C4-5 a similar procedure was  carried out but here there was noted to be bilateral uncinate hypertrophy  with severe stenosis in the foramen.  This was decompressed in a similar  fashion using a high speed air drill and a series of 1 and 2 mm Kerrison  punches to remove remnants of bone and ultimately opening the ligament over  the exiting nerve roots.  Once this space was decompressed, some Gelfoam  soaked in Thrombin was left in the epidural space to maintain hemostasis.  Attention was turned to C5-6 where a similar procedure was carried out and  again here only right-sided hypertrophy of the uncinate process was  encountered and a very tight stenosis on that side.  Once these areas were  each decompressed and hemostasis was adequately achieved, the interspace was  sized for appropriate size graft and it was felt that a 6 mm lordotic  graft  could be placed in C5-C6 and this was placed after being packed with the  patient's own autologous bone that had been harvested from the uncinate  dissections.  At C3-C4, then a 6 mm lordotic graft was also placed but at C4-  C5 a 7 mm lordotic graft allowed for the best fit and positioning.  Once the  grafts were placed after being packed with the patient's own bone, a 54 mm  standard sized Synthes plate was contoured with a prevertebral space using  the template and then placed and fitted with eight locking 4 x 16 mm screws.  Confirmation of the surgical construct was obtained radiographically and  this was felt to be quite adequate.  The area was copiously irrigated  with  antibiotic irrigating solution.  Hemostasis in the surrounding soft tissues  was obtained meticulously and then the platysma was closed with 3-0 Vicryl  in interrupted fashion.  3-0 Vicryl was used in the subcuticular tissue.  Dermabond was placed on the skin.  The patient tolerated the procedure well  and was returned to the recovery room in stable condition.                                               Stefani Dama, M.D.    Merla Riches  D:  01/01/2003  T:  01/02/2003  Job:  [id number]

## 2010-06-17 NOTE — Op Note (Signed)
NAMEMD, SMOLA              ACCOUNT NO.:  [id number]   MEDICAL RECORD NO.:  [id number]          PATIENT TYPE:  INP   LOCATION:  2550                         FACILITY:  [facility]   PHYSICIAN:  Nadara Mustard, M.D.   DATE OF BIRTH:  19-Jul-1931   DATE OF PROCEDURE:  11/12/2003  DATE OF DISCHARGE:                                 OPERATIVE REPORT   PREOPERATIVE DIAGNOSIS:  Osteoarthritis, left knee.   POSTOPERATIVE DIAGNOSIS:  Osteoarthritis, left knee.   PROCEDURE:  Left total knee arthroplasty with a #9 Osteonics Scorpio femur,  #9 tibia, #9 patella with an 18 mm posterior stabilized tibial tray.   SURGEON:  Nadara Mustard, M.D.   ANESTHESIA:  Femoral block plus general.   ESTIMATED BLOOD LOSS:  Minimal.   ANTIBIOTICS:  1 gram Kefzol.   TOURNIQUET TIME:  91 minutes at 350 mmHg.   DISPOSITION:  To the PACU in stable condition.   INDICATIONS FOR PROCEDURE:  The patient is a 77 year old gentleman who is  status post a right total knee arthroplasty.  He has had progressive pain  with his left knee, pain with activities of daily living.  He has failed  conservative care and presents at this time for total knee arthroplasty. The  risks and benefits were discussed including infection, neurovascular injury,  persistent pain, need for additional surgery, DVT, pulmonary embolus.  The  patient states he understands and wishes to proceed at this time.   DESCRIPTION OF PROCEDURE:  The patient underwent a femoral block in the  holding area.  He then was taken back to the OR where he underwent a general  anesthetic.  After an adequate level of anesthesia was obtained, the  patient's left lower extremity was prepped using DuraPrep and draped in a  sterile field with an [place] covering all exposed skin.  The knee was flexed  and the tourniquet inflated at the thigh at 350 mmHg.  A midline incision  was made.  This was carried down with a medial parapatellar retinacular  incision.  The  patella was subluxed laterally.  The intramedullary guide  hole was made for the IM guide for the femur.  The 10 mm cutting block with  5 degrees of valgus was placed and 12 mm was taken off the distal femur.  The attention was turned to the tibia.  The external alignment guide was  used for the tibia and this was set for 4 mm.  An additional 2 mm was taken  off the tibia.  Attention was then focused back on the femur.  This was  sized for a 9.  The 9 cutting block was placed.  The chamfer cuts were made  for the size 9.  There was no notching of the femur dorsally.  The cutting  block was then placed for the posterior stabilized component and the keel  cuts were made for the posterior stabilized component.  The trial components  were placed with the #9 femur and a #9 tibial tray was placed and this was  tried sequentially with blocks and was felt to be stable  with an 18 mm  block.  This was stable to varus and valgus stress, had full extension and  full flexion.  The rotation was marked on the tibia and this was then  removed.  The tibial cut was placed.  The external alignment was again  checked for the tibia.  The keel was secured and the keel cuts were made for  the 9 cemented tibia.  The clamp was then used for the patellar cut and 10  mm was resected from the patella and this was then sized for a 9 and the  punch holes were made for the size 9.  The wound was irrigated with pulse  lavage and the cement was mixed.  The tibia and femur were cemented in  place.  The patella was then cemented in place.  All loose cement was  removed.  The tibial tray was placed and the knee was held in extension  until the cement had hardened.  All loose cement was again removed.  The  knee was again pulse lavaged.  The knee was placed through a full range of  motion.  There was a very small lateral release performed and the patella  tracked without subluxing laterally.  Hemostasis was obtained after the   tourniquet was deflated and the retinaculum was closed using #1 Vicryl, the  subcu was closed using 2-0 Vicryl, the skin was closed using approximate  staples.  The wound was covered with Adaptic, orthopedic sponges, Webril,  and Coban.  The patient was extubated and taken to the PACU in stable  condition.       MVD/MEDQ  D:  11/12/2003  T:  11/12/2003  Job:  [id number]

## 2010-06-17 NOTE — Discharge Summary (Signed)
Joseph [name], Joseph [name]                        ACCOUNT NO.:  [id number]   MEDICAL RECORD NO.:  [id number]                   PATIENT TYPE:  INP   LOCATION:  5011                                 FACILITY:  [facility]   PHYSICIAN:  Nadara Mustard, M.D.                DATE OF BIRTH:  01-22-1932   DATE OF ADMISSION:  08/05/2003  DATE OF DISCHARGE:  08/09/2003                                 DISCHARGE SUMMARY   DIAGNOSIS:  Osteoarthritis right knee.   PROCEDURE:  Right total hip arthroplasty.   DISPOSITION:  Discharged to home in stable condition, follow up in the  office in two weeks.   HISTORY OF PRESENT ILLNESS:  The patient is a 77 year old gentleman with  osteoarthritis of his right knee.  The patient has failed conservative care  and has progressed to the point where he is unable to perform activities of  daily living due to right knee pain and presents at this for right total  knee arthroplasty.  The patient's hospital course was essentially  unremarkable.  He underwent a right total knee on August 05, 2003 with general  anesthetic, plus a femoral block.  He was started on Kefzol for infection  prophylaxis and Coumadin for DVT prophylaxis.  He was started on physical  therapy for progressive ambulation, weight-bearing as tolerated.  The  patient was also seen in consultation for rehab and was felt that he would  be able to be discharged to home with home health PT.  His IV and PCA were  discontinued on August 07, 2003.  The patient continued to progress well with  physical therapy and was discharged to home on August 09, 2003 with home  health physical therapy, Coumadin for DVT prophylaxis and follow up in the  office in two weeks.                                                Nadara Mustard, M.D.    MVD/MEDQ  D:  09/08/2003  T:  09/08/2003  Job:  [id number]

## 2010-06-17 NOTE — Procedures (Signed)
[place]  Patient:    Joseph [name]                      MRN: [id number] Proc. Date: 02/21/99 Adm. Date:  [id number] Disc. Date: [id number] Attending:  Estella Husk                           Procedure Report  PROCEDURE:  Esophageal manometry.  INDICATIONS:  Dysphagia and heartburn.  HISTORY:  This is a 77 year old gentleman, who was recently evaluated for complaints of heartburn and dysphagia.  Underwent upper endoscopy with no obvious explanation for symptoms.  He is now for esophageal manometry.  Ashby Dawes of the procedure, as well as, the risks, benefits and alternatives were reviewed.  He understood and agreed to proceed.  PROCEDURE:  The patient reported to the [facility]. Esophageal manometry was performed in the standard manner.  Data acquired for analysis and patient cooperation were adequate.  FINDINGS: 1. Upper esophageal sphincter demonstrated normal relaxation and coordination. 2. The esophageal body demonstrated normal peristalsis.  A few nonpropagated waves with swallows were noted.  The lower esophageal sphincter resting pressure was measured at 10.9 mmHg.  This represents the lower limits of normal.  Normal relaxation with wet swallowing was present.  IMPRESSION:  Essentially normal esophageal manometry.  RECOMMENDATIONS:  Office follow-up is planned. DD:  02/23/99 TD:  02/24/99 Job: [id number]

## 2010-06-17 NOTE — Op Note (Signed)
Joseph [name], Joseph [name]                        ACCOUNT NO.:  [id number]   MEDICAL RECORD NO.:  [id number]                   PATIENT TYPE:  INP   LOCATION:  2899                                 FACILITY:  [facility]   PHYSICIAN:  Nadara Mustard, M.D.                DATE OF BIRTH:  06-20-1931   DATE OF PROCEDURE:  08/05/2003  DATE OF DISCHARGE:                                 OPERATIVE REPORT   PREOPERATIVE DIAGNOSIS:  Osteoarthritis, right knee.   POSTOPERATIVE DIAGNOSIS:  Osteoarthritis, right knee.   OPERATION PERFORMED:  Right total knee arthroplasty with Osteonics  components, #9 Scorpio femoral component, posterior stabilized, #11 tibial  component, 11 mm poly tray, posterior stabilized, #9 patella.   SURGEON:  Nadara Mustard, M.D.   ANESTHESIA:  General plus popliteal block.   ESTIMATED BLOOD LOSS:  Minimal.   ANTIBIOTICS:  1g Kefzol.   TOURNIQUET TIME:  81 minutes at 350 mmHg.   DISPOSITION:  To post anesthesia care unit in stable condition.   INDICATIONS FOR PROCEDURE:  The patient is a 77 year old gentleman who has  had increasing pain and inability to do activities of daily living due to  right knee pain.  He has undergone arthroscopy, cortisone injections,  nonsteroidals without relief and wishes to proceed with total knee  replacement at this time.  The risks and benefits were discussed including  infection, neurovascular injury, persistent pain, failure of the implants,  need for additional surgery, DVT, pulmonary embolus.  The patient states she  understands and wishes to proceed at this time.   DESCRIPTION OF PROCEDURE:  The patient was brought to the operating room 5  and underwent a general anesthetic after he obtained a popliteal block in  the holding area.  After adequate level of anesthesia was obtained, the  patient's right lower extremity was prepped using DuraPrep and draped into a  sterile field.  An [place] was used to cover all exposed skin.  The leg  was  elevated and the tourniquet inflated to 350 mmHg.  A midline incision was  made just lateral to midline.  The patient had a previous medial incision  for an open meniscectomy and this was done to keep the two incisions  furthest away as possible. ________ was carried down to the retinaculum and  a medial parapatellar incision was made.  The patella was everted and the  femoral canal starter hole was made in the femoral canal.  The guidewire was  placed for a 5 degree 10 mm cutting block.  This was pinned and an extra 2  mm was taken.  The distal cut was made.  Attention was then focused on the  cutting block.  This was sized for a nine.  This was pinned and the [place]  cuts were made for a 9 cutting block. Attention was then focused on the  tibia.  The external alignment guide was used with  a 5 degree posterior  slope.  This was set up to 10.4 with 4 mm taken off the medial tibial  plateau and an additional 2 mm was taken.  The cut was made and next the box  cut was used for the posterior stabilized box cuts for the femur.  The block  was pinned and secured and the box cuts were made. The trial femoral and  tibial components were then tried.  The tibial component was stable with an  11 mm tray.  This was tried and the 18 mm poly provided the most stability.  Varus and valgus stress was stable and a constrained knee was not felt to be  necessary.  The leg was checked with the external alignment rod and the leg  had good alignment.  The alignment was marked for the tibia and the keel  cuts were then made for the 11 cemented keel. The patella was then  resurfaced with a total of about 12 mm taken from the patella.  The punch  holes were then made for the #9 patella.  The wound was irrigated with pulse  lavage.  The cement was mixed and the femoral, tibial and patellar  components were then cemented in place.  The 18 mm poly block was then  placed and the knee was held in extension until  the cement had hardened.  All loose cement was removed.  The wound was then again irrigated with pulse  lavage.  The knee was placed through a range of motion and there was slight  subluxation of the patella and a lateral release was performed.  The medial  parapatellar retinacular incision was closed using a #1 Vicryl.  Subcu was  closed using 2-0 Vicryl.  Skin was closed using Proximate staples.  The  wound was covered with Adaptic orthopedic sponges, sterile Webril and a  Coban dressing.  Tourniquet was deflated after 81 minutes.  The patient was  extubated and taken to PACU in stable condition.                                               Nadara Mustard, M.D.    MVD/MEDQ  D:  08/05/2003  T:  08/05/2003  Job:  [id number]

## 2010-06-17 NOTE — Op Note (Signed)
[facility]  Patient:    Joseph [name], Joseph [name] Visit Number: [id number] MRN: [id number]          Service Type: EMS Location: ED Attending Physician:  Donnetta Hutching Proc. Date: 10/22/00 Admit Date:  10/22/2000                             Operative Report  PREOPERATIVE DIAGNOSIS:  Left distal ureteral calculus.  POSTOPERATIVE DIAGNOSIS:  Urinary calculus trapped in prostatic urethra.  PROCEDURE:  Cystoscopy, extraction of prostatic urethral calculus, retrograde pyelography, ureteroscopy.  SURGEON:  Barron Alvine, M.D.  ANESTHESIA:  General.  INDICATIONS:  [name] is a 77 year old male who has a complex urologic history.  We have evaluated him for bladder neck obstruction, and he is status post TURP in the past.  He also has a history of stage A prostate cancer, nephrolithiasis.  He presented recently with severe left flank pain and voiding symptoms.  A CT scan done in our office several days ago showed a 6 mm distal left ureteral stone.  The patient was asymptomatic when we evaluated him, and we thought there was a good chance that the stone had indeed passed to his bladder.  He reports a weekend full of discomfort, both in the left lower quadrant and flank area as well as considerable voiding symptoms.  He presented to the emergency room this morning and requested intervention.  He presents now for evaluation and potential treatment.  TECHNIQUE AND FINDINGS:  The patient was brought to the operating room where he underwent successful induction of general anesthesia.  He was placed in the lithotomy position, and prepped and draped in the usual manner.  On cystoscopy, the patients prostatic urethra was reasonably well resected.  At the bladder neck, there was a slight contracture and stuck within that contracture was a 6-8 mm calcification.  This was able to be pushed back into the ______  and broke into two pieces.  These were then  basket-extracted. Based on his history as well as the recent CT scan, I believe what had transpired is that the distal ureteral stone did indeed come down out of his ureter and then got wedged into his bladder neck and prostatic urethra which caused some recurrent symptoms.  I performed retrograde pyelography and did not see a definitive stone.  We went ahead without the need for dilation and went up with a mini ureteroscope and examined the most distal 5-6 cm of ureter.  No obvious stone could be seen.  The patient had a lidocaine jelly instilled in his urethra after his bladder was emptied.  There was no reason to leave a double-J stent.  The patient was brought to the recovery room in stable condition. Attending Physician:  Donnetta Hutching DD:  10/22/00 TD:  10/22/00 Job: [id number]

## 2010-06-17 NOTE — Discharge Summary (Signed)
NAMECLEMENT, DENEAULT              ACCOUNT NO.:  [id number]   MEDICAL RECORD NO.:  [id number]          PATIENT TYPE:  INP   LOCATION:  5028                         FACILITY:  [facility]   PHYSICIAN:  Nadara Mustard, MD     DATE OF BIRTH:  July 08, 1931   DATE OF ADMISSION:  11/12/2003  DATE OF DISCHARGE:  11/15/2003                                 DISCHARGE SUMMARY   DIAGNOSIS:  Osteoarthritis left knee.   PROCEDURE:  Left total knee arthroplasty.   Discharged to home in stable condition.   PLAN:  To follow up in the office in 2 weeks.   DISCHARGE MEDICATIONS:  Include Coumadin for DVT prophylaxis for 4 weeks.   HISTORY OF PRESENT ILLNESS:  The patient is a 77 year old gentleman who is  status post a right total knee arthroplasty.  The patient has progressed  well and has had pain with activities of daily living due to osteoarthritis  of this left knee and presents at this time for a left total knee  arthroplasty.   HOSPITAL COURSE:  Essentially unremarkable.  He underwent a left total knee  arthroplasty on November 12, 2003 with [employer]onics components #9 femur, #9  tibia, #9 patella with an 18 mm poly tray with Scorpio components.  Total  tourniquet time was 91 minutes and the patient received Kefzol for infection  prophylaxis for 24 hours.  The patient's postoperative course was  essentially unremarkable.  Postop radiographs show stable alignment.  The  patient's hemoglobin remained stable.  He progressed well with physical  therapy and the patient was discharged to home in stable condition on  October 16th with follow up in the office in 2 weeks with Coumadin for DVT  prophylaxis for a total of 4 weeks.      Vernia Buff   MVD/MEDQ  D:  12/08/2003  T:  12/08/2003  Job:  [id number]

## 2010-06-17 NOTE — Op Note (Signed)
NAMECONSTANTINE, RUDDICK                        ACCOUNT NO.:  [id number]   MEDICAL RECORD NO.:  [id number]                   PATIENT TYPE:  AMB   LOCATION:  DAY                                  FACILITY:  [facility]   PHYSICIAN:  Valetta Fuller, M.D.               DATE OF BIRTH:  [birth year]   DATE OF PROCEDURE:  DATE OF DISCHARGE:                                 OPERATIVE REPORT   PREOPERATIVE DIAGNOSIS:  Right ureteral calculus.   POSTOPERATIVE DIAGNOSIS:  Right ureteral calculus.   PROCEDURE PERFORMED:  Cystoscopy, retrograde pyelography, double-J stent  placement.   SURGEON:  Valetta Fuller, M.D.   ANESTHESIA:  General.   INDICATIONS:  Mr. Eugene is a 77 year old male who has recurrent  nephrolithiasis.  He presented recently with some recurrent back discomfort.  A CT showed what appeared to be a 7 mm stone in his proximal right ureter.  He was on the schedule early next week for lithotripsy.  He has called [place]  because of significant pain, which did require a trip to the emergency room.  For that reason, we are putting a temporizing double-J stent in to try to  alleviate his observation.   TECHNIQUE/FINDINGS:  Patient was brought to the operating room, where he had  successful induction of general anesthesia.  He was placed in the lithotomy  position and prepped and draped in the usual manner.  Cystoscopy revealed a  relatively unremarkable prostatic fossa and bladder.  There was a 2 mm stone  fragment within his bladder, which was removed.  Retrograde pyelogram  confirmed a filling defect in the proximal ureter.  There did not appear to  be high-grade obstruction.  There was a partially duplicated system with the  stone underneath the bifurcation.  A guidewire was placed into the upper  pole moiety.  A 24 cm 6 French double-J stent was then placed with  fluoroscopic as well as visual guidance.  The patient appeared to tolerate  the procedure well.  There were no obvious  complications.                                               Valetta Fuller, M.D.    DSG/MEDQ  D:  10/09/2003  T:  10/10/2003  Job:  [id number]

## 2010-07-11 ENCOUNTER — Other Ambulatory Visit: Payer: Self-pay | Admitting: Family Medicine

## 2010-07-11 ENCOUNTER — Ambulatory Visit
Admission: RE | Admit: 2010-07-11 | Discharge: 2010-07-11 | Disposition: A | Discharge status: Home / Self Care | Payer: Medicare Other | Source: Ambulatory Visit | Attending: Family Medicine | Admitting: Family Medicine

## 2010-07-11 DIAGNOSIS — R079 Chest pain, unspecified: Secondary | ICD-10-CM

## 2010-07-22 NOTE — Discharge Summary (Signed)
  Joseph [name], Joseph [name]              ACCOUNT NO.:  [id number]  MEDICAL RECORD NO.:  [id number]  LOCATION:  WLED                         FACILITY:  [facility]  PHYSICIAN:  Dreyton Roessner I. Patsi Sears, M.D.DATE OF BIRTH:  Jan 28, 1932  DATE OF ADMISSION:  04/04/2010 DATE OF DISCHARGE:  04/05/2010                              DISCHARGE SUMMARY   FINAL DIAGNOSIS:  Left hydronephrosis, left 7-mm upper ureteral stone.  OPERATION:  Operation this hospitalization took place on April 04, 2010. Operation is cystourethroscopy, left retrograde pyelogram with interpretation, left double-J stent.  HISTORY:  The patient's history is as follows:  Mr. [name] is a 77 year old male with a history of left flank pain for several days, nausea, vomiting and fever.  The pain was unremitting, with a pain level 10/10 despite medications in the emergency room.  CT scan showed a 7 mm proximal left ureteral stone with hydronephrosis and perinephric stranding.  He was admitted via the emergency room into the operating room for cystoscopy and retrograde pyelogram and double-J stent pain.  Remaining H and P is as noted in the dictated note of April 04, 2010.  HOSPITAL COURSE:  On April 05, 2010, the patient underwent left retrograde pyelogram with interpretation and placement of the left double-J stent, measuring 6-French x 26 cm.  He was observed, covered with antibiotics, and allowed to be discharged in improved condition. He will follow up with his urologist, Dr. Isabel Caprice.  He was discharged in improved condition on antibiotics and pain medication.     Angelyse Heslin I. Patsi Sears, M.D.     SIT/MEDQ  D:  07/07/2010  T:  07/07/2010  Job:  [id number]  Electronically Signed by Jethro Bolus M.D. on 07/22/2010 01:18:18 PM

## 2010-11-04 LAB — COMPREHENSIVE METABOLIC PANEL
AST: 38 U/L — ABNORMAL HIGH (ref 0–37)
CO2: 23 mEq/L (ref 19–32)
Calcium: 9.3 mg/dL (ref 8.4–10.5)
Creatinine, Ser: 1.08 mg/dL (ref 0.4–1.5)
GFR calc Af Amer: >60 mL/min (ref >60)
GFR calc non Af Amer: >60 mL/min (ref >60)
Total Protein: 6.8 g/dL (ref 6.0–8.3)

## 2010-11-04 LAB — URINALYSIS, ROUTINE W REFLEX MICROSCOPIC
Nitrite: NEGATIVE
Protein, ur: NEGATIVE mg/dL
Urobilinogen, UA: 1 mg/dL (ref 0.0–1.0)

## 2010-11-04 LAB — POCT I-STAT, CHEM 8
BUN: 20 mg/dL (ref 6–23)
Creatinine, Ser: 0.9 mg/dL (ref 0.4–1.5)
Potassium: 4.3 mEq/L (ref 3.5–5.1)
Sodium: 139 mEq/L (ref 135–145)
TCO2: 22 mmol/L (ref 0–100)

## 2010-11-04 LAB — DIFFERENTIAL
Eosinophils Relative: 0 % (ref 0–5)
Lymphocytes Relative: 4 % — ABNORMAL LOW (ref 12–46)
Lymphs Abs: 0.4 10*3/uL — ABNORMAL LOW (ref 0.7–4.0)

## 2010-11-04 LAB — POCT CARDIAC MARKERS
CKMB, poc: 1 ng/mL (ref 1.0–8.0)
Myoglobin, poc: 76.3 ng/mL (ref 12–200)

## 2010-11-04 LAB — CBC
MCHC: 32.2 g/dL (ref 30.0–36.0)
MCV: 93 fL (ref 78.0–100.0)
Platelets: 165 10*3/uL (ref 150–400)
RBC: 5.3 MIL/uL (ref 4.22–5.81)
RDW: 13.2 % (ref 11.5–15.5)

## 2011-01-06 IMAGING — RF DG ESOPHAGUS
18 of 22 series · 19 of 24 positions shown · non-contrast
Comparison: 02/25/2009

CLINICAL DATA: 78-year-old male with solid food dysphagia and
reported achalasia.

ESOPHOGRAM / BARIUM SWALLOW / BARIUM TABLET STUDY
TECHNIQUE: Combined double contrast and single contrast
examination performed using effervescent crystals, thick barium
liquid, and thin barium liquid.  The patient was observed with
fluoroscopy swallowing a 13mm barium sulphate tablet.
Fluoroscopy time:  2.3 minutes.

[Series 1: run · 2 of 17 slices shown (1 of 18)]
[im 1/17]
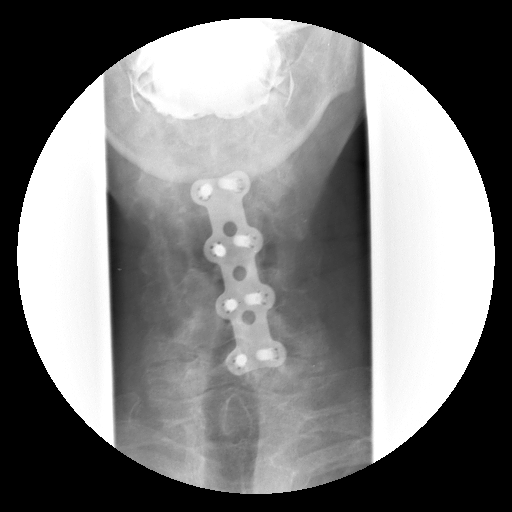
[im 17/17]
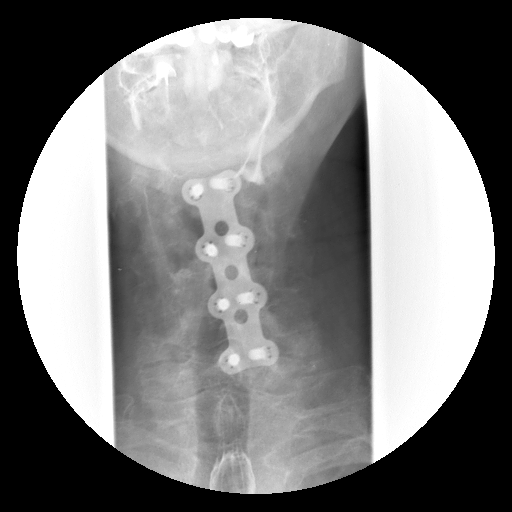

[Series 2: run · 1 of 16 slices shown (2 of 18)]
[im 16/16]
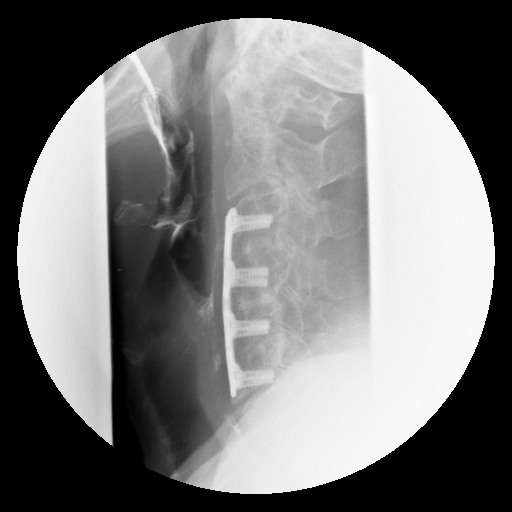

[Series 3: run · 1 of 1 slices shown (3 of 18)]
[im 1/1]
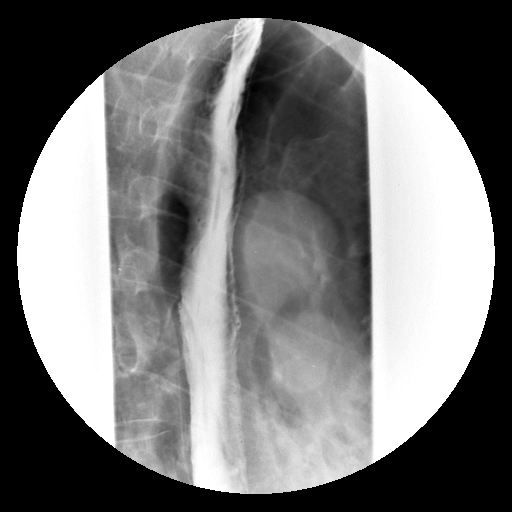

[Series 4: run · 1 of 1 slices shown (4 of 18)]
[im 1/1]
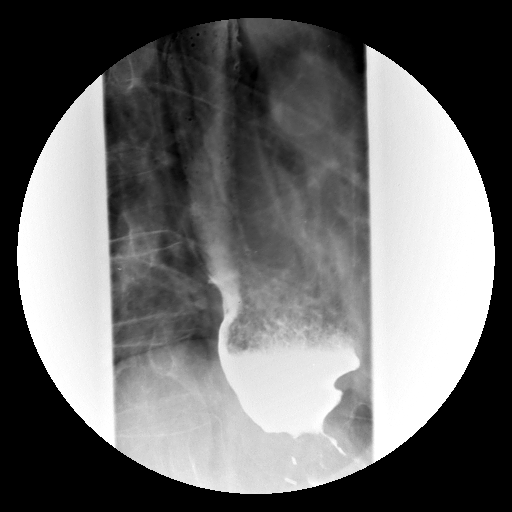

[Series 5: run · 1 of 1 slices shown (5 of 18)]
[im 1/1]
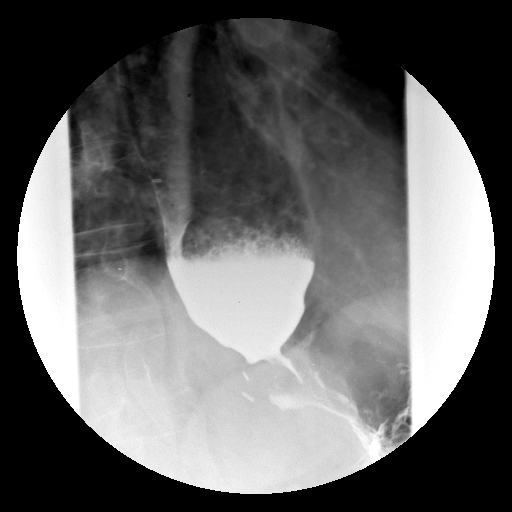

[Series 7: run · 1 of 1 slices shown (6 of 18)]
[im 1/1]
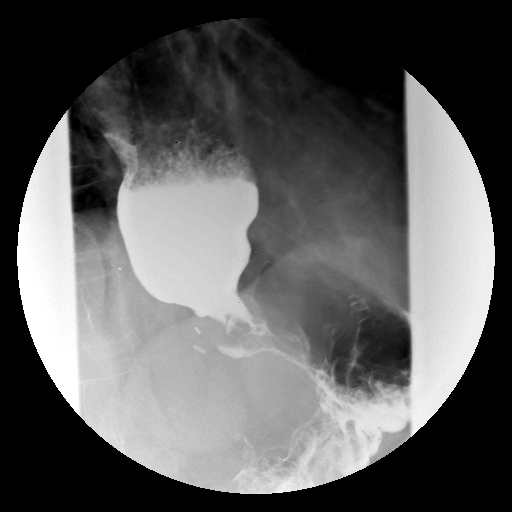

[Series 8: run · 1 of 1 slices shown (7 of 18)]
[im 1/1]
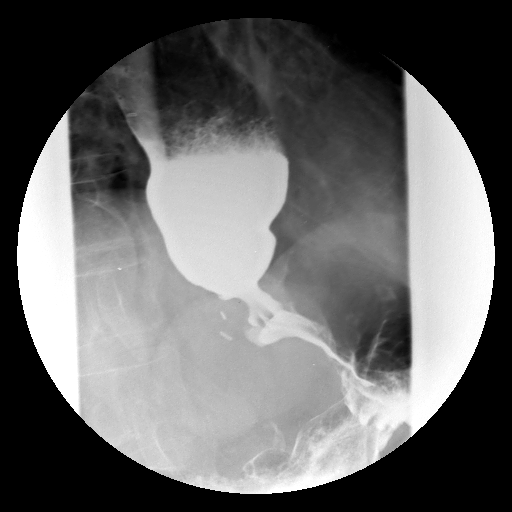

[Series 9: run · 1 of 1 slices shown (8 of 18)]
[im 1/1]
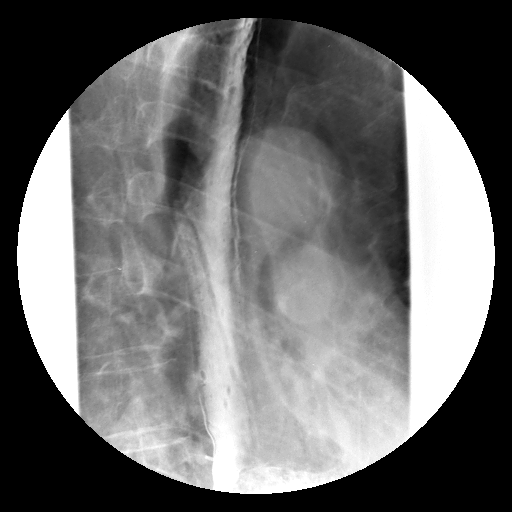

[Series 11: run · 1 of 1 slices shown (9 of 18)]
[im 1/1]
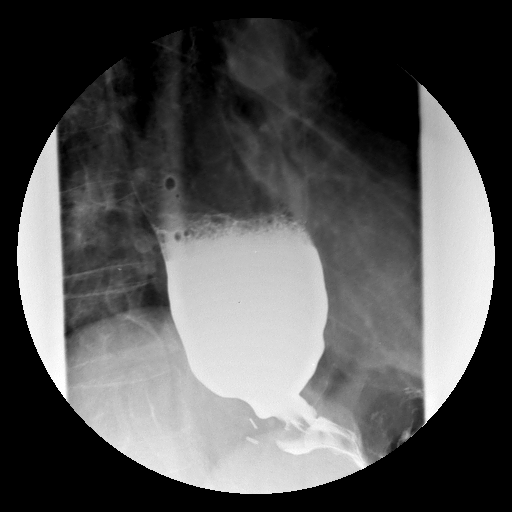

[Series 12: run · 1 of 1 slices shown (10 of 18)]
[im 1/1]
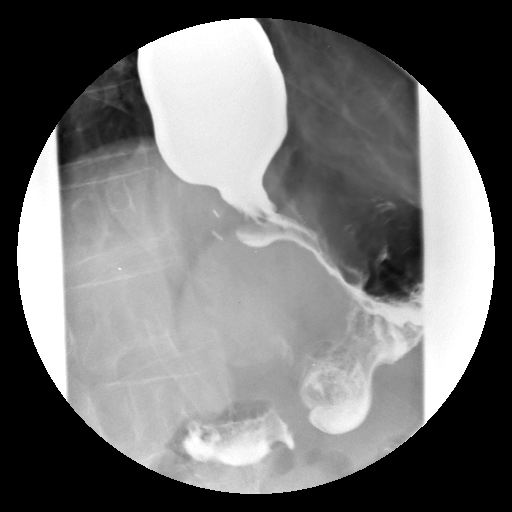

[Series 13: run · 1 of 1 slices shown (11 of 18)]
[im 1/1]
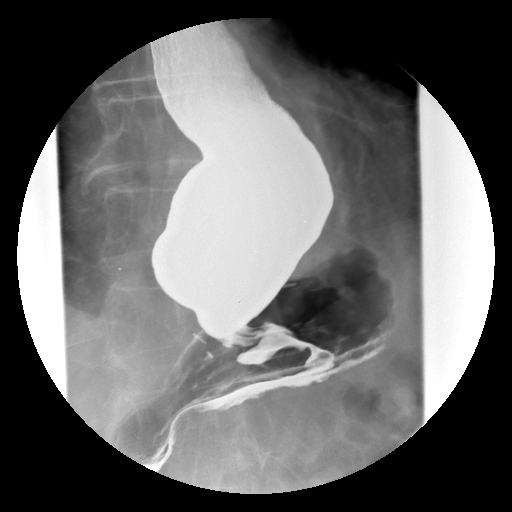

[Series 14: run · 1 of 1 slices shown (12 of 18)]
[im 1/1]
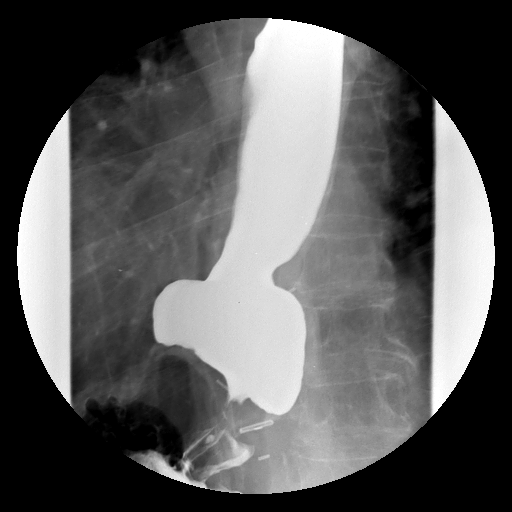

[Series 16: run · 1 of 1 slices shown (13 of 18)]
[im 1/1]
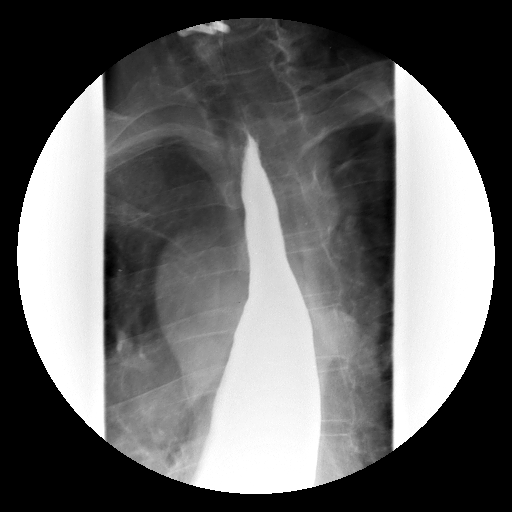

[Series 17: run · 1 of 1 slices shown (14 of 18)]
[im 1/1]
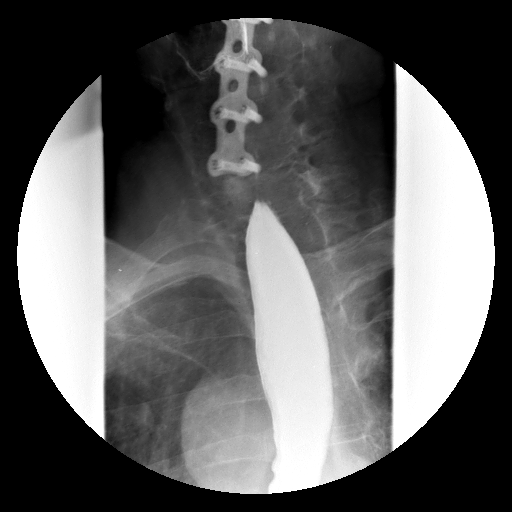

[Series 18: run · 1 of 1 slices shown (15 of 18)]
[im 1/1]
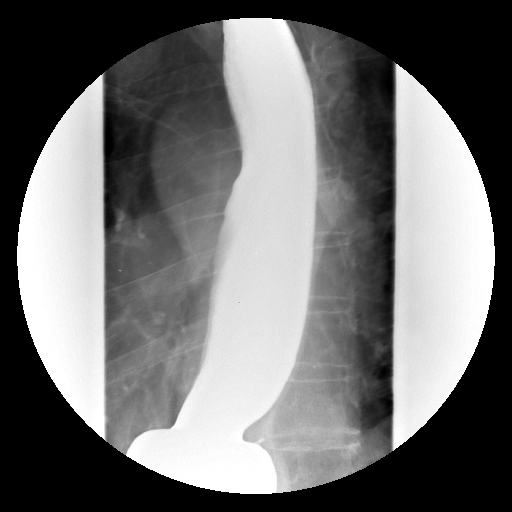

[Series 19: run · 1 of 1 slices shown (16 of 18)]
[im 1/1]
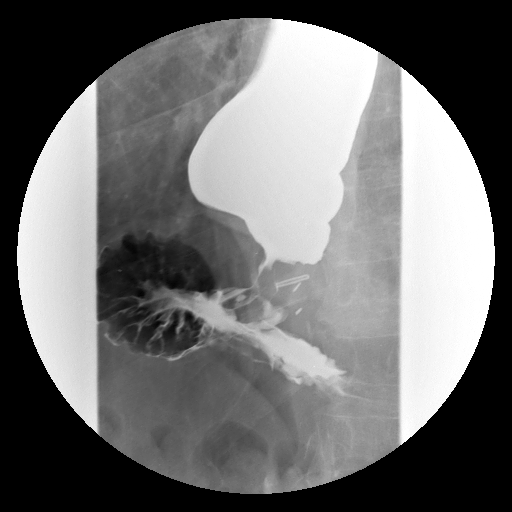

[Series 21: run · 1 of 1 slices shown (17 of 18)]
[im 1/1]
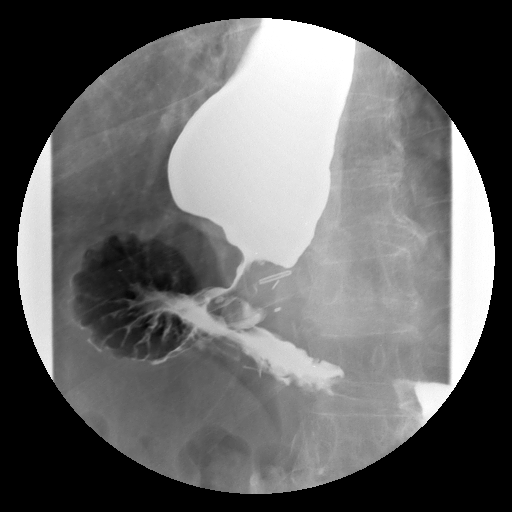

[Series 22: run · 1 of 1 slices shown (18 of 18)]
[im 1/1]
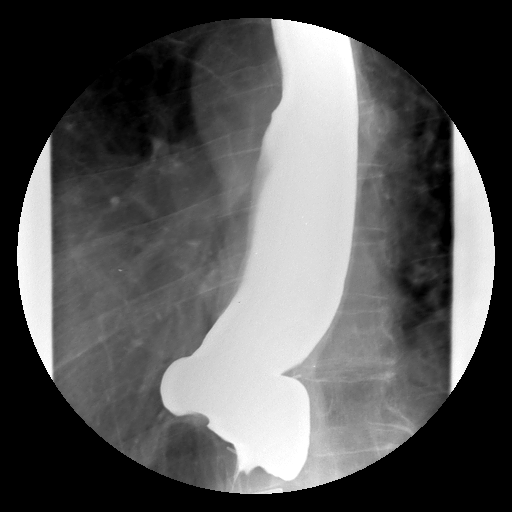

[19 of 24 positions shown; findings below may reference images not displayed]

FINDINGS: The pharynx is unremarkable.

Very poor primary esophageal contractions are noted during this
study.
The esophagus is moderately to severely dilated with stasis of
contrast in the distal esophagus.  Smooth narrowing/beaking at the
esophagogastric junction is noted.
There are no filling defects or areas of fixed narrowing within the
esophagus.
Postoperative changes at the esophagogastric junction are again
identified.

The barium tablet did not progress from the distal esophagus into
the stomach.
IMPRESSION: Very poor primary esophageal contractions, moderate - severe
dilated esophagus and narrowing/beaking of the esophagogastric
junction, compatible with achalasia.

## 2011-11-09 ENCOUNTER — Encounter: Payer: Self-pay | Admitting: Internal Medicine

## 2011-12-31 HISTORY — PX: JOINT REPLACEMENT: SHX530

## 2012-01-17 ENCOUNTER — Encounter (HOSPITAL_COMMUNITY): Payer: Self-pay

## 2012-01-17 ENCOUNTER — Inpatient Hospital Stay (HOSPITAL_COMMUNITY)
Admission: EM | Admit: 2012-01-17 | Discharge: 2012-01-22 | DRG: 482 | Disposition: A | Discharge status: Skilled Nursing Facility | Payer: Medicare Other | Attending: Internal Medicine | Admitting: Internal Medicine

## 2012-01-17 ENCOUNTER — Emergency Department (HOSPITAL_COMMUNITY): Payer: Medicare Other

## 2012-01-17 DIAGNOSIS — W19XXXA Unspecified fall, initial encounter: Secondary | ICD-10-CM

## 2012-01-17 DIAGNOSIS — K219 Gastro-esophageal reflux disease without esophagitis: Secondary | ICD-10-CM

## 2012-01-17 DIAGNOSIS — K5900 Constipation, unspecified: Secondary | ICD-10-CM

## 2012-01-17 DIAGNOSIS — K220 Achalasia of cardia: Secondary | ICD-10-CM

## 2012-01-17 DIAGNOSIS — I10 Essential (primary) hypertension: Secondary | ICD-10-CM

## 2012-01-17 DIAGNOSIS — S72009A Fracture of unspecified part of neck of unspecified femur, initial encounter for closed fracture: Secondary | ICD-10-CM

## 2012-01-17 DIAGNOSIS — S72002A Fracture of unspecified part of neck of left femur, initial encounter for closed fracture: Secondary | ICD-10-CM

## 2012-01-17 DIAGNOSIS — R1032 Left lower quadrant pain: Secondary | ICD-10-CM

## 2012-01-17 DIAGNOSIS — Z8546 Personal history of malignant neoplasm of prostate: Secondary | ICD-10-CM

## 2012-01-17 DIAGNOSIS — W08XXXA Fall from other furniture, initial encounter: Secondary | ICD-10-CM | POA: Diagnosis present

## 2012-01-17 DIAGNOSIS — R1310 Dysphagia, unspecified: Secondary | ICD-10-CM

## 2012-01-17 DIAGNOSIS — R933 Abnormal findings on diagnostic imaging of other parts of digestive tract: Secondary | ICD-10-CM

## 2012-01-17 DIAGNOSIS — S72143A Displaced intertrochanteric fracture of unspecified femur, initial encounter for closed fracture: Principal | ICD-10-CM | POA: Diagnosis present

## 2012-01-17 DIAGNOSIS — R42 Dizziness and giddiness: Secondary | ICD-10-CM | POA: Diagnosis not present

## 2012-01-17 DIAGNOSIS — Y998 Other external cause status: Secondary | ICD-10-CM

## 2012-01-17 DIAGNOSIS — K5732 Diverticulitis of large intestine without perforation or abscess without bleeding: Secondary | ICD-10-CM

## 2012-01-17 DIAGNOSIS — K224 Dyskinesia of esophagus: Secondary | ICD-10-CM

## 2012-01-17 DIAGNOSIS — R1012 Left upper quadrant pain: Secondary | ICD-10-CM

## 2012-01-17 DIAGNOSIS — K5730 Diverticulosis of large intestine without perforation or abscess without bleeding: Secondary | ICD-10-CM

## 2012-01-17 DIAGNOSIS — K625 Hemorrhage of anus and rectum: Secondary | ICD-10-CM

## 2012-01-17 DIAGNOSIS — Y92009 Unspecified place in unspecified non-institutional (private) residence as the place of occurrence of the external cause: Secondary | ICD-10-CM

## 2012-01-17 DIAGNOSIS — S7292XA Unspecified fracture of left femur, initial encounter for closed fracture: Secondary | ICD-10-CM

## 2012-01-17 DIAGNOSIS — Z860100 Personal history of colon polyps, unspecified: Secondary | ICD-10-CM

## 2012-01-17 DIAGNOSIS — R110 Nausea: Secondary | ICD-10-CM | POA: Diagnosis not present

## 2012-01-17 HISTORY — DX: Essential (primary) hypertension: I10

## 2012-01-17 LAB — CBC WITH DIFFERENTIAL/PLATELET
Basophils Relative: 0 % (ref 0–1)
Eosinophils Absolute: 0 10*3/uL (ref 0.0–0.7)
Eosinophils Relative: 0 % (ref 0–5)
Lymphs Abs: 0.7 10*3/uL (ref 0.7–4.0)
MCH: 29.3 pg (ref 26.0–34.0)
MCHC: 33.3 g/dL (ref 30.0–36.0)
MCV: 87.8 fL (ref 78.0–100.0)
Monocytes Relative: 9 % (ref 3–12)
Platelets: 156 10*3/uL (ref 150–400)
RBC: 4.27 MIL/uL (ref 4.22–5.81)

## 2012-01-17 LAB — COMPREHENSIVE METABOLIC PANEL
BUN: 15 mg/dL (ref 6–23)
Calcium: 8.2 mg/dL — ABNORMAL LOW (ref 8.4–10.5)
GFR calc Af Amer: 72 mL/min — ABNORMAL LOW (ref >90)
Glucose, Bld: 172 mg/dL — ABNORMAL HIGH (ref 70–99)
Total Protein: 5.7 g/dL — ABNORMAL LOW (ref 6.0–8.3)

## 2012-01-17 LAB — TYPE AND SCREEN
ABO/RH(D): O POS
Antibody Screen: NEGATIVE

## 2012-01-17 LAB — GLUCOSE, CAPILLARY: Glucose-Capillary: 277 mg/dL — ABNORMAL HIGH (ref 70–99)

## 2012-01-17 LAB — PROTIME-INR: Prothrombin Time: 13.7 seconds (ref 11.6–15.2)

## 2012-01-17 MED ORDER — HYDROMORPHONE HCL PF 1 MG/ML IJ SOLN
0.5000 mg | Freq: Once | INTRAMUSCULAR | Status: AC
  Administered 2012-01-17: 0.5 mg via INTRAVENOUS
  Filled 2012-01-17: qty 1

## 2012-01-17 MED ORDER — SODIUM CHLORIDE 0.9 % IV SOLN: 0.5000 mg/h | INTRAVENOUS | Status: DC

## 2012-01-17 MED ORDER — ONDANSETRON HCL 4 MG/2ML IJ SOLN
4.0000 mg | Freq: Once | INTRAMUSCULAR | Status: AC
  Administered 2012-01-17: 4 mg via INTRAVENOUS
  Filled 2012-01-17: qty 2

## 2012-01-17 MED ORDER — HYDROMORPHONE HCL PF 1 MG/ML IJ SOLN
1.0000 mg | Freq: Once | INTRAMUSCULAR | Status: AC
  Administered 2012-01-18: 1 mg via INTRAVENOUS
  Filled 2012-01-17: qty 1

## 2012-01-17 MED ORDER — ONDANSETRON HCL 4 MG/2ML IJ SOLN
4.0000 mg | Freq: Once | INTRAMUSCULAR | Status: AC
  Administered 2012-01-18: 4 mg via INTRAVENOUS
  Filled 2012-01-17: qty 2

## 2012-01-17 MED ORDER — HYDROMORPHONE HCL PF 1 MG/ML IJ SOLN: 0.5000 mg | INTRAMUSCULAR | Status: DC | PRN

## 2012-01-17 NOTE — ED Notes (Signed)
Was given total of 150 mcg Fentanyl and 8 mg Zofran per EMS PTA

## 2012-01-17 NOTE — ED Provider Notes (Signed)
History     CSN: [id number]  Arrival date & time 01/17/12  2122   First MD Initiated Contact with Patient 01/17/12 2134     Chief Complaint  Patient presents with  . Fall    fell from step stool onto carpeted floor; heard "pop" in left hip; no LOC; LLE externally rotated and shortened; no other obvious injuries noted at this time    HPI: [name] is a 78 yo CM with history of HTN who presents with left hip pain and associated deformity after falling from a step stool just prior to arrival. He was standing on the stool to change the battery in the smoke detector. His foot got caught on the step as he attempted to descend. He fell from 2-3 off the ground onto his left hip. He had immediate pain and was unable to walk, he slid to the phone and called EMS. On arrival he complains only of left hip pain, described as aching, located over the greater trochanter, non-radiating, severe, associated with paresthesias. He was given Fentanyl enroute which offered him some relief. He did not hit his head or have LOC.   Past Medical History  Diagnosis Date  . Hypertension    Past Surgical History  Procedure Date  . Hernia repair     No family history on file.  History  Substance Use Topics  . Smoking status: Not on file  . Smokeless tobacco: Not on file  . Alcohol Use:      Review of Systems  Constitutional: Negative for fever, chills and fatigue.  HENT: Negative for trouble swallowing, neck pain and neck stiffness.   Eyes: Negative for photophobia and visual disturbance.  Respiratory: Negative for cough, shortness of breath and wheezing.   Cardiovascular: Negative for chest pain and palpitations.  Gastrointestinal: Negative for nausea, vomiting, abdominal pain and diarrhea.  Genitourinary: Negative for dysuria, urgency and frequency.  Musculoskeletal: Positive for myalgias (left leg), arthralgias (left hip) and gait problem.  Neurological: Negative for dizziness, seizures, syncope,  speech difficulty and headaches.  Psychiatric/Behavioral: Negative for confusion and agitation.    Allergies  Review of patient's allergies indicates no known allergies.  Home Medications  No current outpatient prescriptions on file.  BP 158/70  Pulse 95  Temp 98 F (36.7 C) (Oral)  Resp 16  SpO2 97%  Physical Exam  Constitutional: He appears well-nourished. He is cooperative. He appears distressed (in pain).       Pleasant gentleman, appears younger than stated age.   HENT:  Head: Normocephalic and atraumatic.  Mouth/Throat: Oropharynx is clear and moist and mucous membranes are normal.  Eyes: Conjunctivae normal and EOM are normal. Pupils are equal, round, and reactive to light.  Neck: Trachea normal and normal range of motion. Neck supple.  Cardiovascular: Normal rate, regular rhythm, S1 normal and S2 normal.  Exam reveals no decreased pulses.   No murmur heard. Pulmonary/Chest: Effort normal and breath sounds normal. He has no decreased breath sounds.  Abdominal: Soft. Normal appearance and bowel sounds are normal. There is no tenderness.  Musculoskeletal:       Left hip: He exhibits bony tenderness (over deformity) and deformity (leg shortened, internally rotated. ).       Cervical back: He exhibits no bony tenderness.       Thoracic back: He exhibits no bony tenderness.       Lumbar back: He exhibits no bony tenderness.  Neurological: He is alert. No sensory deficit.  NV intact to right LE.   Skin: Skin is warm and dry.    ED Course  Procedures   Labs Reviewed  GLUCOSE, CAPILLARY - Abnormal; Notable for the following:    Glucose-Capillary 277 (*)     All other components within normal limits  CBC WITH DIFFERENTIAL - Abnormal; Notable for the following:    Hemoglobin 12.5 (*)     HCT 37.5 (*)     Neutrophils Relative 84 (*)     Lymphocytes Relative 8 (*)     All other components within normal limits  COMPREHENSIVE METABOLIC PANEL - Abnormal; Notable for  the following:    Glucose, Bld 172 (*)     Calcium 8.2 (*)     Total Protein 5.7 (*)     Albumin 3.2 (*)     GFR calc non Af Amer 62 (*)     GFR calc Af Amer 72 (*)     All other components within normal limits  TYPE AND SCREEN  PROTIME-INR  ABO/RH  URINALYSIS, ROUTINE W REFLEX MICROSCOPIC  CBC  BASIC METABOLIC PANEL   Dg Pelvis 1-2 Views  01/17/2012  *RADIOLOGY REPORT*  Clinical Data: Left femur pain  PELVIS - 1-2 VIEW  Comparison: Contemporaneous femur radiographs  Findings: Comminuted and displaced intertrochanteric left femur fracture with subtrochanteric extension as described on contemporaneous femur radiographs.  Diffuse osteopenia.  No additional fracture or dislocation.  Sacrum is partially obscured by overlying bowel.  Lower lumbar degenerative changes.  IMPRESSION: Left intertrochanteric femur fracture with subtrochanteric extension.  No additional fracture identified.   Original Report Authenticated By: Jearld Lesch, M.D.    Dg Femur Left  01/17/2012  *RADIOLOGY REPORT*  Clinical Data: Left femur pain status post fall.  LEFT FEMUR - 2 VIEW  Comparison: None.  Findings: There is a comminuted and displaced intertrochanteric fracture with subtrochanteric extension.  The distal component is displaced medially and superiorly.  The proximal component is abducted.  No hip joint dislocation.  Left knee arthroplasty is partially imaged.  Osteopenia.  IMPRESSION: Comminuted/displaced fracture of the left intertrochanteric femur with subtrochanteric extension.   Original Report Authenticated By: Jearld Lesch, M.D.    1. Fall at home   2. Achalasia and cardiospasm   3. Dyskinesia of esophagus   4. Hip fracture, left   5. Unspecified essential hypertension    MDM  78 yo CM with history of HTN who presents with left hip pain and associated deformity after falling from a step stool just prior to arrival. Afebrile, vital signs stable. Leg shortened and internally rotated, likely hip  fracture. Pain controlled on arrival with fentanyl. NV intact to to left leg. X-rays confirmed intertrochanteric fracture with subtrochanteric extension. Labs: normal lytes and cr. Hgb 12.5, WBC 9.1, normal INR. He required two additional doses of pain medication while in ED, pain well controlled. Discussed with on call Ortho who will see in AM. Admitted to Hospitalist in stable condition. Discussed w/u with patient and his family members and need for admission.   Reviewed imaging, labs and previous medical records, utilized in MDM  Discussed case with Dr. Preston Fleeting  Clinical Impression 1. Left intertrochanteric hip fracture        Margie Billet, MD 01/18/12 0222

## 2012-01-17 NOTE — ED Provider Notes (Signed)
78 year old male fell off of a stool or trying to change pattern is noted protect her. He injured his left hip. He is not able to ambulate. He is brought in by ambulance. On exam, left leg is shortened and externally rotated consistent with fracture. He'll be sent for x-rays and will need to be admitted for  orthopedic management.  X-rays show intertrochanteric and subtrochanteric fracture. Images reviewed by me. Case has been discussed with orthopedics and internal medicine.   Date: 01/17/2012  Rate: 99  Rhythm: normal sinus rhythm  QRS Axis: left  Intervals: QT prolonged  ST/T Wave abnormalities: normal  Conduction Disutrbances:none  Narrative Interpretation: Borderline prolonged QT interval, left axis deviation. No prior ECG available for comparison.  Old EKG Reviewed: none available    I saw and evaluated the patient, reviewed the resident's note and I agree with the findings and plan.   Dione Booze, MD 01/17/12 2322

## 2012-01-18 ENCOUNTER — Encounter (HOSPITAL_COMMUNITY): Payer: Self-pay | Admitting: Anesthesiology

## 2012-01-18 ENCOUNTER — Encounter (HOSPITAL_COMMUNITY): Payer: Self-pay | Admitting: Urology

## 2012-01-18 ENCOUNTER — Inpatient Hospital Stay (HOSPITAL_COMMUNITY): Payer: Medicare Other

## 2012-01-18 ENCOUNTER — Inpatient Hospital Stay (HOSPITAL_COMMUNITY): Payer: Medicare Other | Admitting: Anesthesiology

## 2012-01-18 ENCOUNTER — Encounter (HOSPITAL_COMMUNITY)
Admission: EM | Disposition: A | Discharge status: Skilled Nursing Facility | Payer: Self-pay | Source: Home / Self Care | Attending: Internal Medicine

## 2012-01-18 DIAGNOSIS — S7290XA Unspecified fracture of unspecified femur, initial encounter for closed fracture: Secondary | ICD-10-CM

## 2012-01-18 DIAGNOSIS — S7292XA Unspecified fracture of left femur, initial encounter for closed fracture: Secondary | ICD-10-CM | POA: Diagnosis present

## 2012-01-18 DIAGNOSIS — Y92009 Unspecified place in unspecified non-institutional (private) residence as the place of occurrence of the external cause: Secondary | ICD-10-CM

## 2012-01-18 DIAGNOSIS — W19XXXA Unspecified fall, initial encounter: Secondary | ICD-10-CM

## 2012-01-18 DIAGNOSIS — K219 Gastro-esophageal reflux disease without esophagitis: Secondary | ICD-10-CM

## 2012-01-18 DIAGNOSIS — S72002A Fracture of unspecified part of neck of left femur, initial encounter for closed fracture: Secondary | ICD-10-CM | POA: Diagnosis present

## 2012-01-18 HISTORY — PX: FEMUR IM NAIL: SHX1597

## 2012-01-18 LAB — BASIC METABOLIC PANEL
Calcium: 8.6 mg/dL (ref 8.4–10.5)
GFR calc non Af Amer: 78 mL/min — ABNORMAL LOW (ref >90)
Glucose, Bld: 143 mg/dL — ABNORMAL HIGH (ref 70–99)
Potassium: 3.9 mEq/L (ref 3.5–5.1)
Sodium: 138 mEq/L (ref 135–145)

## 2012-01-18 LAB — URINALYSIS, ROUTINE W REFLEX MICROSCOPIC
Bilirubin Urine: NEGATIVE
Hgb urine dipstick: NEGATIVE
Ketones, ur: 15 mg/dL — AB
Protein, ur: NEGATIVE mg/dL
Urobilinogen, UA: 1 mg/dL (ref 0.0–1.0)

## 2012-01-18 LAB — CBC
Hemoglobin: 12.3 g/dL — ABNORMAL LOW (ref 13.0–17.0)
MCHC: 32.8 g/dL (ref 30.0–36.0)
Platelets: 161 10*3/uL (ref 150–400)
RBC: 4.31 MIL/uL (ref 4.22–5.81)

## 2012-01-18 LAB — SURGICAL PCR SCREEN: MRSA, PCR: NEGATIVE

## 2012-01-18 SURGERY — INSERTION, INTRAMEDULLARY ROD, FEMUR
Anesthesia: General | Site: Leg Upper | Laterality: Left | Wound class: Clean

## 2012-01-18 MED ORDER — METOCLOPRAMIDE HCL 5 MG/ML IJ SOLN
5.0000 mg | Freq: Three times a day (TID) | INTRAMUSCULAR | Status: DC | PRN
  Filled 2012-01-18: qty 2

## 2012-01-18 MED ORDER — EPHEDRINE SULFATE 50 MG/ML IJ SOLN
INTRAMUSCULAR | Status: DC | PRN
  Administered 2012-01-18: 10 mg via INTRAVENOUS

## 2012-01-18 MED ORDER — SUFENTANIL CITRATE 50 MCG/ML IV SOLN
INTRAVENOUS | Status: DC | PRN
  Administered 2012-01-18 (×2): 10 ug via INTRAVENOUS

## 2012-01-18 MED ORDER — LACTATED RINGERS IV SOLN
INTRAVENOUS | Status: DC | PRN
  Administered 2012-01-18 (×2): via INTRAVENOUS

## 2012-01-18 MED ORDER — OXYCODONE HCL 5 MG PO TABS
5.0000 mg | ORAL_TABLET | ORAL | Status: DC | PRN
  Administered 2012-01-19 (×2): 5 mg via ORAL
  Administered 2012-01-19 – 2012-01-21 (×5): 10 mg via ORAL
  Filled 2012-01-18: qty 2
  Filled 2012-01-18: qty 1
  Filled 2012-01-18 (×3): qty 2
  Filled 2012-01-18: qty 1
  Filled 2012-01-18: qty 2

## 2012-01-18 MED ORDER — ACETAMINOPHEN 10 MG/ML IV SOLN
1000.0000 mg | Freq: Four times a day (QID) | INTRAVENOUS | Status: AC
  Administered 2012-01-19 (×4): 1000 mg via INTRAVENOUS
  Filled 2012-01-18 (×5): qty 100

## 2012-01-18 MED ORDER — PHENYLEPHRINE HCL 10 MG/ML IJ SOLN
INTRAMUSCULAR | Status: DC | PRN
  Administered 2012-01-18: 80 ug via INTRAVENOUS

## 2012-01-18 MED ORDER — ONDANSETRON HCL 4 MG/2ML IJ SOLN: 4.0000 mg | Freq: Four times a day (QID) | INTRAMUSCULAR | Status: DC | PRN

## 2012-01-18 MED ORDER — CEFAZOLIN SODIUM-DEXTROSE 2-3 GM-% IV SOLR
2.0000 g | Freq: Four times a day (QID) | INTRAVENOUS | Status: AC
  Administered 2012-01-19 (×2): 2 g via INTRAVENOUS
  Filled 2012-01-18 (×3): qty 50

## 2012-01-18 MED ORDER — MENTHOL 3 MG MT LOZG
1.0000 | LOZENGE | OROMUCOSAL | Status: DC | PRN
  Filled 2012-01-18: qty 9

## 2012-01-18 MED ORDER — METHOCARBAMOL 100 MG/ML IJ SOLN
500.0000 mg | Freq: Four times a day (QID) | INTRAVENOUS | Status: DC | PRN
  Filled 2012-01-18: qty 5

## 2012-01-18 MED ORDER — CEFAZOLIN SODIUM-DEXTROSE 2-3 GM-% IV SOLR
2.0000 g | Freq: Once | INTRAVENOUS | Status: AC
  Administered 2012-01-18: 2 g via INTRAVENOUS
  Filled 2012-01-18: qty 50

## 2012-01-18 MED ORDER — HYDROCODONE-ACETAMINOPHEN 5-325 MG PO TABS
1.0000 | ORAL_TABLET | Freq: Four times a day (QID) | ORAL | Status: DC | PRN
  Administered 2012-01-18 (×4): 1 via ORAL
  Filled 2012-01-18 (×4): qty 1

## 2012-01-18 MED ORDER — METOCLOPRAMIDE HCL 5 MG/ML IJ SOLN: 10.0000 mg | Freq: Once | INTRAMUSCULAR | Status: DC | PRN

## 2012-01-18 MED ORDER — POTASSIUM CHLORIDE IN NACL 20-0.9 MEQ/L-% IV SOLN
INTRAVENOUS | Status: DC
  Administered 2012-01-18: 03:00:00 via INTRAVENOUS
  Filled 2012-01-18: qty 1000

## 2012-01-18 MED ORDER — POLYETHYLENE GLYCOL 3350 17 G PO PACK
17.0000 g | PACK | Freq: Every day | ORAL | Status: DC | PRN
  Administered 2012-01-21: 17 g via ORAL
  Filled 2012-01-18 (×2): qty 1

## 2012-01-18 MED ORDER — DOCUSATE SODIUM 100 MG PO CAPS
100.0000 mg | ORAL_CAPSULE | Freq: Two times a day (BID) | ORAL | Status: DC
  Administered 2012-01-19 – 2012-01-21 (×6): 100 mg via ORAL
  Filled 2012-01-18 (×8): qty 1

## 2012-01-18 MED ORDER — TRAMADOL HCL 50 MG PO TABS
50.0000 mg | ORAL_TABLET | Freq: Four times a day (QID) | ORAL | Status: DC | PRN
  Administered 2012-01-21 – 2012-01-22 (×3): 50 mg via ORAL
  Filled 2012-01-18 (×2): qty 1
  Filled 2012-01-18: qty 2
  Filled 2012-01-18: qty 1

## 2012-01-18 MED ORDER — METHOCARBAMOL 500 MG PO TABS
500.0000 mg | ORAL_TABLET | Freq: Four times a day (QID) | ORAL | Status: DC | PRN
  Administered 2012-01-19 – 2012-01-20 (×5): 500 mg via ORAL
  Filled 2012-01-18 (×6): qty 1

## 2012-01-18 MED ORDER — POTASSIUM CHLORIDE IN NACL 20-0.9 MEQ/L-% IV SOLN
INTRAVENOUS | Status: DC
  Administered 2012-01-19: 01:00:00 via INTRAVENOUS
  Filled 2012-01-18 (×2): qty 1000

## 2012-01-18 MED ORDER — ACETAMINOPHEN 10 MG/ML IV SOLN
INTRAVENOUS | Status: DC | PRN
  Administered 2012-01-18: 1000 mg via INTRAVENOUS

## 2012-01-18 MED ORDER — PROMETHAZINE HCL 25 MG/ML IJ SOLN
12.5000 mg | Freq: Once | INTRAMUSCULAR | Status: AC
  Administered 2012-01-18: 12.5 mg via INTRAVENOUS
  Filled 2012-01-18: qty 1

## 2012-01-18 MED ORDER — LIDOCAINE HCL 4 % MT SOLN
OROMUCOSAL | Status: DC | PRN
  Administered 2012-01-18: 4 mL via TOPICAL

## 2012-01-18 MED ORDER — HYDROMORPHONE HCL PF 1 MG/ML IJ SOLN
0.2500 mg | INTRAMUSCULAR | Status: DC | PRN
  Administered 2012-01-18 (×2): 0.25 mg via INTRAVENOUS

## 2012-01-18 MED ORDER — METOCLOPRAMIDE HCL 5 MG PO TABS
5.0000 mg | ORAL_TABLET | Freq: Three times a day (TID) | ORAL | Status: DC | PRN
  Administered 2012-01-20 – 2012-01-21 (×3): 10 mg via ORAL
  Filled 2012-01-18: qty 2

## 2012-01-18 MED ORDER — PANTOPRAZOLE SODIUM 40 MG PO TBEC
40.0000 mg | DELAYED_RELEASE_TABLET | Freq: Every day | ORAL | Status: DC
  Administered 2012-01-18: 40 mg via ORAL
  Filled 2012-01-18: qty 1

## 2012-01-18 MED ORDER — ACETAMINOPHEN 650 MG RE SUPP: 650.0000 mg | Freq: Four times a day (QID) | RECTAL | Status: DC | PRN

## 2012-01-18 MED ORDER — ACETAMINOPHEN 325 MG PO TABS
650.0000 mg | ORAL_TABLET | Freq: Four times a day (QID) | ORAL | Status: DC | PRN
  Administered 2012-01-21 (×2): 325 mg via ORAL
  Administered 2012-01-22: 650 mg via ORAL
  Filled 2012-01-18: qty 1
  Filled 2012-01-18: qty 2
  Filled 2012-01-18: qty 1

## 2012-01-18 MED ORDER — ONDANSETRON HCL 4 MG/2ML IJ SOLN
INTRAMUSCULAR | Status: DC | PRN
  Administered 2012-01-18: 4 mg via INTRAVENOUS

## 2012-01-18 MED ORDER — 0.9 % SODIUM CHLORIDE (POUR BTL) OPTIME
TOPICAL | Status: DC | PRN
  Administered 2012-01-18: 900 mL

## 2012-01-18 MED ORDER — PHENOL 1.4 % MT LIQD
1.0000 | OROMUCOSAL | Status: DC | PRN
  Filled 2012-01-18: qty 177

## 2012-01-18 MED ORDER — ACETAMINOPHEN 10 MG/ML IV SOLN
INTRAVENOUS | Status: AC
  Filled 2012-01-18: qty 100

## 2012-01-18 MED ORDER — BISACODYL 10 MG RE SUPP
10.0000 mg | Freq: Every day | RECTAL | Status: DC | PRN
  Administered 2012-01-21: 10 mg via RECTAL
  Filled 2012-01-18: qty 1

## 2012-01-18 MED ORDER — METOPROLOL SUCCINATE ER 100 MG PO TB24
100.0000 mg | ORAL_TABLET | Freq: Every day | ORAL | Status: DC
  Administered 2012-01-18 – 2012-01-22 (×5): 100 mg via ORAL
  Filled 2012-01-18 (×5): qty 1

## 2012-01-18 MED ORDER — HYDROMORPHONE HCL PF 1 MG/ML IJ SOLN
1.0000 mg | INTRAMUSCULAR | Status: DC | PRN
  Administered 2012-01-18 (×2): 1 mg via INTRAVENOUS
  Filled 2012-01-18 (×2): qty 1

## 2012-01-18 MED ORDER — LIDOCAINE HCL (CARDIAC) 20 MG/ML IV SOLN
INTRAVENOUS | Status: DC | PRN
  Administered 2012-01-18: 100 mg via INTRAVENOUS

## 2012-01-18 MED ORDER — MORPHINE SULFATE 2 MG/ML IJ SOLN: 1.0000 mg | INTRAMUSCULAR | Status: DC | PRN

## 2012-01-18 MED ORDER — ENOXAPARIN SODIUM 40 MG/0.4ML ~~LOC~~ SOLN
40.0000 mg | SUBCUTANEOUS | Status: DC
  Administered 2012-01-19 – 2012-01-22 (×4): 40 mg via SUBCUTANEOUS
  Filled 2012-01-18 (×5): qty 0.4

## 2012-01-18 MED ORDER — OXYCODONE HCL 5 MG PO TABS: 5.0000 mg | ORAL_TABLET | Freq: Once | ORAL | Status: DC | PRN

## 2012-01-18 MED ORDER — FLEET ENEMA 7-19 GM/118ML RE ENEM
1.0000 | ENEMA | Freq: Once | RECTAL | Status: AC | PRN
  Filled 2012-01-18: qty 1

## 2012-01-18 MED ORDER — DEXAMETHASONE SODIUM PHOSPHATE 4 MG/ML IJ SOLN
INTRAMUSCULAR | Status: DC | PRN
  Administered 2012-01-18: 4 mg via INTRAVENOUS

## 2012-01-18 MED ORDER — SODIUM CHLORIDE 0.9 % IV SOLN
INTRAVENOUS | Status: DC
  Administered 2012-01-18: 75 mL/h via INTRAVENOUS

## 2012-01-18 MED ORDER — OXYCODONE HCL 5 MG/5ML PO SOLN: 5.0000 mg | Freq: Once | ORAL | Status: DC | PRN

## 2012-01-18 MED ORDER — MIDAZOLAM HCL 5 MG/5ML IJ SOLN
INTRAMUSCULAR | Status: DC | PRN
  Administered 2012-01-18: 2 mg via INTRAVENOUS

## 2012-01-18 MED ORDER — PROPOFOL 10 MG/ML IV BOLUS
INTRAVENOUS | Status: DC | PRN
  Administered 2012-01-18: 200 mg via INTRAVENOUS

## 2012-01-18 MED ORDER — ONDANSETRON HCL 4 MG PO TABS
4.0000 mg | ORAL_TABLET | Freq: Four times a day (QID) | ORAL | Status: DC | PRN
  Administered 2012-01-21: 4 mg via ORAL
  Filled 2012-01-18: qty 1

## 2012-01-18 MED ORDER — SUCCINYLCHOLINE CHLORIDE 20 MG/ML IJ SOLN
INTRAMUSCULAR | Status: DC | PRN
  Administered 2012-01-18: 140 mg via INTRAVENOUS

## 2012-01-18 SURGICAL SUPPLY — 35 items
3.5MM SOLIDLOK FLEX TIP ×2 IMPLANT
BLADE SURG 15 STRL LF DISP TIS (BLADE) ×1 IMPLANT
BLADE SURG 15 STRL SS (BLADE) ×1
BNDG COHESIVE 4X5 TAN STRL (GAUZE/BANDAGES/DRESSINGS) IMPLANT
CLOTH BEACON ORANGE TIMEOUT ST (SAFETY) ×2 IMPLANT
COVER SURGICAL LIGHT HANDLE (MISCELLANEOUS) ×2 IMPLANT
DISTAL GRADUATED DRILL SHORT 4.3MM ×2 IMPLANT
DRAPE PROXIMA HALF (DRAPES) ×4 IMPLANT
DRSG ADAPTIC 3X8 NADH LF (GAUZE/BANDAGES/DRESSINGS) ×2 IMPLANT
DRSG MEPILEX BORDER 4X4 (GAUZE/BANDAGES/DRESSINGS) ×6 IMPLANT
DRSG MEPILEX BORDER 4X8 (GAUZE/BANDAGES/DRESSINGS) ×2 IMPLANT
DURAPREP 26ML APPLICATOR (WOUND CARE) ×2 IMPLANT
ELECT REM PT RETURN 9FT ADLT (ELECTROSURGICAL) ×2
ELECTRODE REM PT RTRN 9FT ADLT (ELECTROSURGICAL) ×1 IMPLANT
EVACUATOR 1/8 PVC DRAIN (DRAIN) IMPLANT
GLOVE BIO SURGEON STRL SZ7.5 (GLOVE) ×2 IMPLANT
GLOVE BIOGEL M 8.0 STRL (GLOVE) ×2 IMPLANT
GLOVE BIOGEL PI IND STRL 8 (GLOVE) ×2 IMPLANT
GLOVE BIOGEL PI INDICATOR 8 (GLOVE) ×2
GOWN PREVENTION PLUS XLARGE (GOWN DISPOSABLE) ×2 IMPLANT
GOWN STRL NON-REIN LRG LVL3 (GOWN DISPOSABLE) ×4 IMPLANT
GUIDEPIN 3.2X17.5 THRD DISP (PIN) ×2 IMPLANT
GUIDEWIRE BALL NOSE 80CM (WIRE) ×2 IMPLANT
HFN LAG SCREW 10.5MM X 115MM (Orthopedic Implant) ×2 IMPLANT
KIT BASIN OR (CUSTOM PROCEDURE TRAY) ×2 IMPLANT
KIT ROOM TURNOVER OR (KITS) ×2 IMPLANT
MANIFOLD NEPTUNE II (INSTRUMENTS) ×2 IMPLANT
NAIL HIP FX LEFT 13X400MM 130 (Nail) ×2 IMPLANT
NS IRRIG 1000ML POUR BTL (IV SOLUTION) ×2 IMPLANT
PACK GENERAL/GYN (CUSTOM PROCEDURE TRAY) ×2 IMPLANT
PAD ARMBOARD 7.5X6 YLW CONV (MISCELLANEOUS) ×4 IMPLANT
SCREW BONE CORTICAL 5.0X40 (Screw) ×2 IMPLANT
STAPLER VISISTAT 35W (STAPLE) IMPLANT
SUT VIC AB 2-0 CTB1 (SUTURE) IMPLANT
WATER STERILE IRR 1000ML POUR (IV SOLUTION) ×4 IMPLANT

## 2012-01-18 NOTE — Progress Notes (Signed)
Orthopedic Tech Progress Note Patient Details:  Joseph [name] 09/19/1931 [id number]  Musculoskeletal Traction Type of Traction: Bucks Skin Traction Traction Location: LEFT LEG BUCK'S TRACTION  Traction Weight: 5 lbs    Shawnie Pons 01/18/2012, 8:53 AM

## 2012-01-18 NOTE — Progress Notes (Signed)
[facility] PROGRESS NOTE  Joseph [name] ZOX:[id number] DOB: 20-Sep-1931 DOA: 01/17/2012 PCP: Thora Lance, MD  Assessment/Plan:  #1 left subtrochanteric femur fracture Secondary to mechanical fall. Patient denies any syncopal episodes. Patient has been seen by orthopedics and planning for surgical repair today. PT/OT. Per orthopedics.  #2 hypertension Stable. Continue Toprol.  #3 gastroesophageal reflux disease Stable. PPI.  #4 Fall Mechanical in nature. PT /OT.  #5 prophylaxis PPI for GI prophylaxis. SCDs for DVT prophylaxis preop. DVT prophylaxis postoperatively will be deferred to orthopedics.   Code Status: Full Family Communication: Updated patient, wife, son at bedside Disposition Plan: Probable skilled nursing facility when stable   Consultants:  Orthopedics: Dr. Lequita Halt 01/18/2012  Procedures:  X-ray left femur 01/17/2012  X-ray of the pelvis 01/17/2012  Antibiotics:  None  HPI/Subjective: Patient denies any chest pain, no shortness of breath. Patient states left hip pain is controlled.  Objective: Filed Vitals:   01/18/12 0937 01/18/12 1200 01/18/12 1358 01/18/12 1600  BP: 151/93  148/83   Pulse: 85  85   Temp:   97.6 F (36.4 C)   TempSrc:   Oral   Resp: 18 18 18 19   Height:      Weight:      SpO2: 97% 97% 97% 98%    Intake/Output Summary (Last 24 hours) at 01/18/12 1745 Last data filed at 01/18/12 1300  Gross per 24 hour  Intake    450 ml  Output   1150 ml  Net   -700 ml   Filed Weights   01/18/12 0155  Weight: 87.4 kg (192 lb 10.9 oz)    Exam:   General:  NAD  Cardiovascular: RRR. Left lower extremity in Buck's traction  Respiratory: CTAB  Abdomen: Soft, nontender, nondistended, positive bowel sounds.  Data Reviewed: Basic Metabolic Panel:  Lab 01/18/12 8119 01/17/12 2238  NA 138 139  K 3.9 4.0  CL 106 108  CO2 26 25  GLUCOSE 143* 172*  BUN 12 15  CREATININE 0.94 1.10  CALCIUM 8.6 8.2*  MG -- --   PHOS -- --   Liver Function Tests:  Lab 01/17/12 2238  AST 16  ALT 7  ALKPHOS 76  BILITOT 0.3  PROT 5.7*  ALBUMIN 3.2*   No results found for this basename: LIPASE:5,AMYLASE:5 in the last 168 hours No results found for this basename: AMMONIA:5 in the last 168 hours CBC:  Lab 01/18/12 0612 01/17/12 2238  WBC 7.8 9.1  NEUTROABS -- 7.6  HGB 12.3* 12.5*  HCT 37.5* 37.5*  MCV 87.0 87.8  PLT 161 156   Cardiac Enzymes: No results found for this basename: CKTOTAL:5,CKMB:5,CKMBINDEX:5,TROPONINI:5 in the last 168 hours BNP (last 3 results) No results found for this basename: PROBNP:3 in the last 8760 hours CBG:  Lab 01/17/12 2149  GLUCAP 277*    No results found for this or any previous visit (from the past 240 hour(s)).   Studies: Dg Pelvis 1-2 Views  01/17/2012  *RADIOLOGY REPORT*  Clinical Data: Left femur pain  PELVIS - 1-2 VIEW  Comparison: Contemporaneous femur radiographs  Findings: Comminuted and displaced intertrochanteric left femur fracture with subtrochanteric extension as described on contemporaneous femur radiographs.  Diffuse osteopenia.  No additional fracture or dislocation.  Sacrum is partially obscured by overlying bowel.  Lower lumbar degenerative changes.  IMPRESSION: Left intertrochanteric femur fracture with subtrochanteric extension.  No additional fracture identified.   Original Report Authenticated By: Jearld Lesch, M.D.    Dg Femur Left  01/17/2012  *  RADIOLOGY REPORT*  Clinical Data: Left femur pain status post fall.  LEFT FEMUR - 2 VIEW  Comparison: None.  Findings: There is a comminuted and displaced intertrochanteric fracture with subtrochanteric extension.  The distal component is displaced medially and superiorly.  The proximal component is abducted.  No hip joint dislocation.  Left knee arthroplasty is partially imaged.  Osteopenia.  IMPRESSION: Comminuted/displaced fracture of the left intertrochanteric femur with subtrochanteric extension.    Original Report Authenticated By: Jearld Lesch, M.D.     Scheduled Meds:    .  ceFAZolin (ANCEF) IV  2 g Intravenous Once  . metoprolol succinate  100 mg Oral Daily  . pantoprazole  40 mg Oral Daily   Continuous Infusions:    . sodium chloride 75 mL/hr (01/18/12 0751)    Principal Problem:  *Femur fracture, left Active Problems:  HYPERTENSION  ESOPHAGEAL MOTILITY DISORDER  GERD  Fall at home  Hip fracture, left    Time spent: . 35 mins    [facility]  [facility] Pager [phone]. If 8PM-8AM, please contact night-coverage at [email or web address], password [facility] 01/18/2012, 5:45 PM  LOS: 1 day

## 2012-01-18 NOTE — H&P (Signed)
PCP:   Thora Lance, MD   Chief Complaint:  fall  HPI: 78 yo male h/o htn and esophageal achalasia was changing a battery in a smoke detector on a step stool when he lost his balance and fell off.  Did not have loc but did hurt his left hip.  He is highly functioning and is overall healthy.  No recent illnesses.  Pain well controlled in ED.  No piror cardiac history except htn.  Review of Systems:  O/w neg  Past Medical History: Past Medical History  Diagnosis Date  . Hypertension    Past Surgical History  Procedure Date  . Hernia repair   [facility] for his [facility]chalasia and had a complication including some sort of perforation to his stomach during that surgery.  He was at [facility] for about 2 weeks and had a full recovery.  He no longer has any problems with motility of his upper gi tract.  Medications: Prior to Admission medications   Medication Sig Start Date End Date Taking? Authorizing Provider  esomeprazole (NEXIUM) 20 MG capsule Take 20 mg by mouth daily as needed. For acid reflux   Yes Historical Provider, MD  metoprolol succinate (TOPROL-XL) 100 MG 24 hr tablet Take 100 mg by mouth daily. Take with or immediately following a meal.   Yes Historical Provider, MD    Allergies:  No Known Allergies  Social History:  does not have a smoking history on file. He does not have any smokeless tobacco history on file. His alcohol and drug histories not on file.  Family History: neg  Physical Exam: Filed Vitals:   01/17/12 2129 01/17/12 2130 01/17/12 2245 01/17/12 2330  BP: 158/70 160/75 160/87 185/87  Pulse: 95 94 96 97  Temp: 98 F (36.7 C)     TempSrc: Oral     Resp: 16   20  SpO2: 89% 95% 100% 100%   General appearance: alert, cooperative and no distress Neck: no JVD and supple, symmetrical, trachea midline Lungs: clear to auscultation bilaterally Heart: regular rate and rhythm, S1, S2 normal, no murmur, click, rub or gallop Abdomen: soft,  non-tender; bowel sounds normal; no masses,  no organomegaly Extremities: extremities normal, atraumatic, no cyanosis or edema Pulses: 2+ and symmetric Skin: Skin color, texture, turgor normal. No rashes or lesions Neurologic: Grossly normal    Labs on Admission:   [facility] 01/17/12 2238  NA 139  K 4.0  CL 108  CO2 25  GLUCOSE 172*  BUN 15  CREATININE 1.10  CALCIUM 8.2*  MG --  PHOS --    Basename 01/17/12 2238  AST 16  ALT 7  ALKPHOS 76  BILITOT 0.3  PROT 5.7*  ALBUMIN 3.2*    Basename 01/17/12 2238  WBC 9.1  NEUTROABS 7.6  HGB 12.5*  HCT 37.5*  MCV 87.8  PLT 156     Radiological Exams on Admission: Dg Pelvis 1-2 Views  01/17/2012  *RADIOLOGY REPORT*  Clinical Data: Left femur pain  PELVIS - 1-2 VIEW  Comparison: Contemporaneous femur radiographs  Findings: Comminuted and displaced intertrochanteric left femur fracture with subtrochanteric extension as described on contemporaneous femur radiographs.  Diffuse osteopenia.  No additional fracture or dislocation.  Sacrum is partially obscured by overlying bowel.  Lower lumbar degenerative changes.  IMPRESSION: Left intertrochanteric femur fracture with subtrochanteric extension.  No additional fracture identified.   Original Report Authenticated By: Jearld Lesch, M.D.    Dg Femur Left  01/17/2012  *RADIOLOGY REPORT*  Clinical Data: Left femur pain status post fall.  LEFT FEMUR - 2 VIEW  Comparison: None.  Findings: There is a comminuted and displaced intertrochanteric fracture with subtrochanteric extension.  The distal component is displaced medially and superiorly.  The proximal component is abducted.  No hip joint dislocation.  Left knee arthroplasty is partially imaged.  Osteopenia.  IMPRESSION: Comminuted/displaced fracture of the left intertrochanteric femur with subtrochanteric extension.   Original Report Authenticated By: Jearld Lesch, M.D.     Assessment/Plan 78 yo male with mechanical fall and  subsequent Comminuted/displaced fracture of the left intertrochanteric femur  with subtrochanteric extension  Principal Problem:  *Hip fracture, left Active Problems:  HYPERTENSION  ESOPHAGEAL MOTILITY DISORDER  GERD  Fall at home  Admit.  ekg nsr without any acute changes.  Prolonged qtc mildly, will repeat in am also as one in ED is some poor quality.  ivf overnight.  Keep npo for hopeful surgery later on today.  If not need to address dvt prophylaxis as will hold lovenox at this time until clear when operation occurs.  Cont his bblocker and ppi.  Mod risk for complications but should do well perioperatively hopefully.  Full code.  Tyiana Hill A 01/18/2012, 1:06 AM

## 2012-01-18 NOTE — Progress Notes (Signed)
Pt arrived to floor in no apparent distress. Pt VSS. Pt oriented to room and floor. No other needs. Will continue to monitor and assess. Jobe Igo A RN 01/18/2012

## 2012-01-18 NOTE — Progress Notes (Signed)
Pt had a 7 beat run of VT VS stable, Pt asym. In bed talking to family. MD aware. Will continue to monitor

## 2012-01-18 NOTE — Progress Notes (Signed)
12/19 Noted that patient's CBGs were 277 mg/dl, 811 mg/dl.  Consider checking a HgbA1C for home blood glucose control.   Will continue to follow while in hospital.  Smith Mince RN BSN CDE

## 2012-01-18 NOTE — Interval H&P Note (Signed)
History and Physical Interval Note:  01/18/2012 7:59 PM  Joseph [name]  has presented today for surgery, with the diagnosis of l sub troch nail  The various methods of treatment have been discussed with the patient and family. After consideration of risks, benefits and other options for treatment, the patient has consented to  Procedure(s) (LRB) with comments: INTRAMEDULLARY (IM) NAIL FEMORAL (Left) as a surgical intervention .  The patient's history has been reviewed, patient examined, no change in status, stable for surgery.  I have reviewed the patient's chart and labs.  Questions were answered to the patient's satisfaction.     Loanne Drilling

## 2012-01-18 NOTE — Consult Note (Signed)
Reason for Consult:Left femur fracture Referring Physician: [facility]  Joseph [name] is an 78 y.o. male.  HPI: Joseph [name] is a 78 yo male who was on a step ladder changing a battery in his smoke detector last night when he tripped coming down the ladder and fell on his left side. He felt a pop and immediate pain in his left hip. He denies any LOC, dizziness, light headedness, palpitations, SOB, chest pain or constitutional symptoms associated with the fall. His only complaint is left hip pain. He does not have any numbness or tingling in his foot. Upon presentation to [facility] he was noted to have a left subtrochanteric femur fracture. We were consulted for management of this injury.  Past Medical History  Diagnosis Date  . Hypertension     Past Surgical History  Procedure Date  . Hernia repair   . Joint replacement     History reviewed. No pertinent family history.  Social History:  reports that he has never smoked. He does not have any smokeless tobacco history on file. He reports that he does not drink alcohol or use illicit drugs.  Allergies: No Known Allergies  Medications: I have reviewed the patient's current medications.  Results for orders placed during the hospital encounter of 01/17/12 (from the past 48 hour(s))  GLUCOSE, CAPILLARY     Status: Abnormal   Collection Time   01/17/12  9:49 PM      Component Value Range Comment   Glucose-Capillary 277 (*) 70 - 99 mg/dL    Comment 1 Documented in Chart      Comment 2 Notify RN     CBC WITH DIFFERENTIAL     Status: Abnormal   Collection Time   01/17/12 10:38 PM      Component Value Range Comment   WBC 9.1  4.0 - 10.5 K/uL    RBC 4.27  4.22 - 5.81 MIL/uL    Hemoglobin 12.5 (*) 13.0 - 17.0 g/dL    HCT 16.1 (*) 09.6 - 52.0 %    MCV 87.8  78.0 - 100.0 fL    MCH 29.3  26.0 - 34.0 pg    MCHC 33.3  30.0 - 36.0 g/dL    RDW 04.5  40.9 - 81.1 %    Platelets 156  150 - 400 K/uL    Neutrophils Relative 84 (*) 43 - 77 %     Neutro Abs 7.6  1.7 - 7.7 K/uL    Lymphocytes Relative 8 (*) 12 - 46 %    Lymphs Abs 0.7  0.7 - 4.0 K/uL    Monocytes Relative 9  3 - 12 %    Monocytes Absolute 0.8  0.1 - 1.0 K/uL    Eosinophils Relative 0  0 - 5 %    Eosinophils Absolute 0.0  0.0 - 0.7 K/uL    Basophils Relative 0  0 - 1 %    Basophils Absolute 0.0  0.0 - 0.1 K/uL   COMPREHENSIVE METABOLIC PANEL     Status: Abnormal   Collection Time   01/17/12 10:38 PM      Component Value Range Comment   Sodium 139  135 - 145 mEq/L    Potassium 4.0  3.5 - 5.1 mEq/L    Chloride 108  96 - 112 mEq/L    CO2 25  19 - 32 mEq/L    Glucose, Bld 172 (*) 70 - 99 mg/dL    BUN 15  6 - 23 mg/dL  Creatinine, Ser 1.10  0.50 - 1.35 mg/dL    Calcium 8.2 (*) 8.4 - 10.5 mg/dL    Total Protein 5.7 (*) 6.0 - 8.3 g/dL    Albumin 3.2 (*) 3.5 - 5.2 g/dL    AST 16  0 - 37 U/L    ALT 7  0 - 53 U/L    Alkaline Phosphatase 76  39 - 117 U/L    Total Bilirubin 0.3  0.3 - 1.2 mg/dL    GFR calc non Af Amer 62 (*) >90 mL/min    GFR calc Af Amer 72 (*) >90 mL/min   PROTIME-INR     Status: Normal   Collection Time   01/17/12 10:38 PM      Component Value Range Comment   Prothrombin Time 13.7  11.6 - 15.2 seconds    INR 1.06  0.00 - 1.49   TYPE AND SCREEN     Status: Normal   Collection Time   01/17/12 10:45 PM      Component Value Range Comment   ABO/RH(D) O POS      Antibody Screen NEG      Sample Expiration 01/20/2012     ABO/RH     Status: Normal   Collection Time   01/17/12 10:45 PM      Component Value Range Comment   ABO/RH(D) O POS     URINALYSIS, ROUTINE W REFLEX MICROSCOPIC     Status: Abnormal   Collection Time   01/18/12  6:13 AM      Component Value Range Comment   Color, Urine YELLOW  YELLOW    APPearance CLEAR  CLEAR    Specific Gravity, Urine 1.022  1.005 - 1.030    pH 7.0  5.0 - 8.0    Glucose, UA 500 (*) NEGATIVE mg/dL    Hgb urine dipstick NEGATIVE  NEGATIVE    Bilirubin Urine NEGATIVE  NEGATIVE    Ketones, ur 15 (*)  NEGATIVE mg/dL    Protein, ur NEGATIVE  NEGATIVE mg/dL    Urobilinogen, UA 1.0  0.0 - 1.0 mg/dL    Nitrite NEGATIVE  NEGATIVE    Leukocytes, UA NEGATIVE  NEGATIVE MICROSCOPIC NOT DONE ON URINES WITH NEGATIVE PROTEIN, BLOOD, LEUKOCYTES, NITRITE, OR GLUCOSE <1000 mg/dL.    Dg Pelvis 1-2 Views  01/17/2012  *RADIOLOGY REPORT*  Clinical Data: Left femur pain  PELVIS - 1-2 VIEW  Comparison: Contemporaneous femur radiographs  Findings: Comminuted and displaced intertrochanteric left femur fracture with subtrochanteric extension as described on contemporaneous femur radiographs.  Diffuse osteopenia.  No additional fracture or dislocation.  Sacrum is partially obscured by overlying bowel.  Lower lumbar degenerative changes.  IMPRESSION: Left intertrochanteric femur fracture with subtrochanteric extension.  No additional fracture identified.   Original Report Authenticated By: Jearld Lesch, M.D.    Dg Femur Left  01/17/2012  *RADIOLOGY REPORT*  Clinical Data: Left femur pain status post fall.  LEFT FEMUR - 2 VIEW  Comparison: None.  Findings: There is a comminuted and displaced intertrochanteric fracture with subtrochanteric extension.  The distal component is displaced medially and superiorly.  The proximal component is abducted.  No hip joint dislocation.  Left knee arthroplasty is partially imaged.  Osteopenia.  IMPRESSION: Comminuted/displaced fracture of the left intertrochanteric femur with subtrochanteric extension.   Original Report Authenticated By: Jearld Lesch, M.D.     ROS Blood pressure 133/88, pulse 90, temperature 97.9 F (36.6 C), temperature source Oral, resp. rate 18, weight 192 lb 10.9 oz (87.4  kg), SpO2 100.00%. Physical Exam  Physical Examination: General appearance - alert, well appearing, and in no distress Mental status - alert, oriented to person, place, and time Chest - clear to auscultation, no wheezes, rales or rhonchi, symmetric air entry Heart - normal rate,  regular rhythm, normal S1, S2, no murmurs, rubs, clicks or gallops Abdomen - soft, nontender, nondistended, no masses or organomegaly Neurological - alert, oriented, normal speech, no focal findings or movement disorder noted Extremities - peripheral pulses normal, no pedal edema, no clubbing or cyanosis, Left lower extremity is shortened and externally rotated. His dorsiflexion and plantarflexion of the left foot are normal; 2+ DP and   PT pulses and intact sensation. Knee exam unremarkable (scar from previous TKA)  Assessment/Plan: Left subtrochanteric femur fracture- Will require operative fixation. Discussed in detail with family and patient and he elects to proceed. Will plan on ORIF with IM nail this evening.  Loanne Drilling 01/18/2012, 6:31 AM

## 2012-01-18 NOTE — Anesthesia Preprocedure Evaluation (Signed)
Anesthesia Evaluation  Patient identified by MRN, date of birth, ID band Patient awake    Reviewed: Allergy & Precautions, H&P , NPO status , Patient's Chart, lab work & pertinent test results, reviewed documented beta blocker date and time   Airway Mallampati: II TM Distance: >3 FB Neck ROM: full    Dental   Pulmonary neg pulmonary ROS,  breath sounds clear to auscultation        Cardiovascular hypertension, On Home Beta Blockers and On Medications negative cardio ROS  Rhythm:regular     Neuro/Psych  Neuromuscular disease negative psych ROS   GI/Hepatic Neg liver ROS, GERD-  Medicated and Controlled,  Endo/Other  negative endocrine ROS  Renal/GU negative Renal ROS  negative genitourinary   Musculoskeletal   Abdominal   Peds  Hematology negative hematology ROS (+)   Anesthesia Other Findings See surgeon's H&P   Reproductive/Obstetrics negative OB ROS                           Anesthesia Physical Anesthesia Plan  ASA: II  Anesthesia Plan: General   Post-op Pain Management:    Induction: Intravenous  Airway Management Planned: Oral ETT  Additional Equipment:   Intra-op Plan:   Post-operative Plan: Extubation in OR  Informed Consent: I have reviewed the patients History and Physical, chart, labs and discussed the procedure including the risks, benefits and alternatives for the proposed anesthesia with the patient or authorized representative who has indicated his/her understanding and acceptance.   Dental Advisory Given  Plan Discussed with: CRNA and Surgeon  Anesthesia Plan Comments:         Anesthesia Quick Evaluation

## 2012-01-18 NOTE — Op Note (Signed)
  OPERATIVE REPORT  PREOPERATIVE DIAGNOSIS: Left subtrochanteric femur fracture.   POSTOP DIAGNOSIS: Left subtrochanteric femur fracture.   PROCEDURE: Intramedullary nailing, Left subtrochanteric femur  fracture.   SURGEON: Ollen Gross, M.D.   ASSISTANT: Alexzandrew L. Perkins, P.A.C.   ANESTHESIA:General  Estimated BLOOD LOSS: 300  DRAINS: None.   COMPLICATIONS:   None  CONDITION: -PACU - hemodynamically stable.    CLINICAL NOTE: Joseph [name] is an 78 y.o. male, who had a fall yesterday off a stepladder   sustaining a displaced  Left subtrochanteric femur fracture. He has been cleared medically and presents for IM nailing left femur.    PROCEDURE IN DETAIL: After successful administration of  General,  the patient was placed on the fracture table with Left lower extremity in a well-padded traction boot,  Right lower extremity in a well-padded leg holder. Under fluoroscopic guidance, the fracture was reduced. The traction was locked in this position. Thigh was prepped and draped in the usual sterile fashion. The guide pin for the Biomet  Affixus was then passed percutaneously to the tip of the greater trochanter, and was entered into the femoral canal. It was passed into the canal. The small incision was made and the starter reamer passed over  the guide pin. This was then removed. The long ball tipped guide wire was then passed into the proximal femoral canal and then reaming was performed over the guide wire to 14.5  Mm. The length of the nail was measured over the guide wire and 400 mm was most appropriate.The nail which was an 13 mm x 400 mm long trochanteric nail with 130 degrees angle was attached to the external guide and then passed into the femoral canal, impacted to the appropriate depth in the canal, then we used the external guide to place the lag screw. Through the external guide, a guide pin was passed. Small incision made, and the guide pin was in the center of the  femoral head on the AP and slightly center to posterior on the lateral. Length was 115 mm. Triple reamer was passed over the guide pin. 115 mm lag screw was placed. It was then locked down with a locking screw. The external guide was then removed. Utilizing the free hand technique under fluoroscopic guidance, the distal interlock was placed through the static hole and this was 40 mm in length with excellent bicortical purchase. Hardware was in good position and fracture was well reduced on AP and lateral views. Wound was copiously irrigated with saline solution, and  closed deep with interrupted 1 Vicryl, subcu interrupted 2-0 Vicryl,and skin with staples. Incision was cleaned and dried and sterile dressings applied. He was awakened and transported to recovery in stable condition.   [name], MD    01/18/2012, 9:23 PM

## 2012-01-18 NOTE — Transfer of Care (Signed)
Immediate Anesthesia Transfer of Care Note  Patient: Joseph [name]  Procedure(s) Performed: Procedure(s) (LRB) with comments: INTRAMEDULLARY (IM) NAIL FEMORAL (Left)  Patient Location: PACU  Anesthesia Type:General  Level of Consciousness: oriented, sedated, patient cooperative and responds to stimulation  Airway & Oxygen Therapy: Patient Spontanous Breathing and Patient connected to nasal cannula oxygen  Post-op Assessment: Report given to PACU RN, Post -op Vital signs reviewed and stable and Patient moving all extremities X 4  Post vital signs: Reviewed and stable  Complications: No apparent anesthesia complications

## 2012-01-19 LAB — BASIC METABOLIC PANEL
Chloride: 104 mEq/L (ref 96–112)
GFR calc Af Amer: >90 mL/min (ref >90)
GFR calc non Af Amer: 78 mL/min — ABNORMAL LOW (ref >90)
Potassium: 4.5 mEq/L (ref 3.5–5.1)
Sodium: 136 mEq/L (ref 135–145)

## 2012-01-19 LAB — CBC
MCHC: 33 g/dL (ref 30.0–36.0)
Platelets: 138 10*3/uL — ABNORMAL LOW (ref 150–400)
RDW: 13.1 % (ref 11.5–15.5)
WBC: 8 10*3/uL (ref 4.0–10.5)

## 2012-01-19 LAB — MAGNESIUM: Magnesium: 1.8 mg/dL (ref 1.5–2.5)

## 2012-01-19 MED ORDER — OMEPRAZOLE 20 MG PO CPDR
20.0000 mg | DELAYED_RELEASE_CAPSULE | Freq: Every day | ORAL | Status: DC
  Administered 2012-01-19 – 2012-01-22 (×4): 20 mg via ORAL
  Filled 2012-01-19 (×4): qty 1

## 2012-01-19 MED ORDER — NON FORMULARY: 20.0000 mg | Freq: Every day | Status: DC

## 2012-01-19 NOTE — Progress Notes (Signed)
HOME HEALTH AGENCIES SERVING [place]   Agencies that are Medicare-Certified and are affiliated with [facility]  Telephone Number Address  [facility]   [facility] has ownership interest in this company; however, you are under no obligation to use this agency. [phone] or  [phone] [address] [place], [place] [address] [email or web address]   Agencies that are Medicare-Certified and are not affiliated with [facility]                                                                                 Home Health Agency Telephone Number Address  [facility] [phone] Fax [phone] [address] [place], [place]  [address] [email or web address]  [facility] [phone] or [phone] Fax [phone] [address] [place], [place] [address] [email or web address]  [facility] [phone] Fax [phone] [address] [place], [place] [address] [email or web address]  [facility] [phone] Fax [phone] [address], [address] [place], [place]  [address] [email or web address]  [facility] [phone] Fax [phone] [address], [address] [place], [place] [address] [email or web address]  [facility] [phone] [address] [place], [place] [address] [email or web address]  [facility] [phone]  [address] [place], [place] [address] [email or web address]  [facility] [phone] or [phone] Fax [phone] [address] [place], [place]  [address] [email or web address]  [facility] [phone] Fax [phone] [address] [place], [place]  [address]  [facility]  [phone] Fax [phone] [address] [place], [place] [address] [email or web address]       Agencies that are not Medicare-Certified and are not affiliated with [facility]   Home Health Agency Telephone Number Address  [facility] [phone] or [phone] Fax [phone] [address] [place], [place]  [address] [email or web address]  [facility] [phone] Fax [phone] [address] [place], [place]  [address] [email or web address]  [facility]  [phone] Fax [phone] [address] [place], [place] [address] [email or web address]  [facility] [phone] Fax [phone] [address] [place], [place] [address]  [facility] [phone] or [phone] Fax [phone] [address], [address] [place], [place]  [address] [email or web address]  [facility] [phone] or [phone] Fax [phone] [address] [place], [place]  [address] [email or web address]  [facility] [phone] Fax [phone] [address] [place], [place]  [address] [email or web address]  [facility] [phone] [address] [place], [place]  [address]  [facility] [phone] Fax [phone] [address] [place], [place]  [address]  [facility] [phone] Fax [phone] [address] [place], [place]  [address] [email or web address]  [facility] [phone] Fax [phone] [address] [place], [place] [address] [email or web address]  [facility] [phone] Fax [phone] [address] [place],   [address] [email or web address]    

## 2012-01-19 NOTE — Evaluation (Signed)
Occupational Therapy Evaluation Patient Details Name: Joseph [name] MRN: [id number] DOB: [birth year] Today's Date: 01/19/2012 Time: 1000-1025 OT Time Calculation (min): 25 min  OT Assessment / Plan / Recommendation Clinical Impression  Pt admitted with Left femur fx and now s/p IM nail.  Will benefit from continued OT to address below problem list in prep for d/c home.    OT Assessment  Patient needs continued OT Services    Follow Up Recommendations  No OT follow up;Supervision/Assistance - 24 hour    Barriers to Discharge      Equipment Recommendations  3 in 1 bedside comode;Tub/shower seat    Recommendations for Other Services    Frequency  Min 2X/week    Precautions / Restrictions Precautions Precautions: Fall Restrictions Weight Bearing Restrictions: Yes LLE Weight Bearing: Weight bearing as tolerated   Pertinent Vitals/Pain See vitals    ADL  Upper Body Bathing: Simulated;Set up Where Assessed - Upper Body Bathing: Unsupported sitting Lower Body Bathing: Simulated;Minimal assistance Where Assessed - Lower Body Bathing: Supported sit to stand Upper Body Dressing: Performed;Set up Where Assessed - Upper Body Dressing: Unsupported sitting Lower Body Dressing: Simulated;Moderate assistance Where Assessed - Lower Body Dressing: Supported sit to Pharmacist, hospital: [facility] Method: Sit to Barista: Other (comment) (bed to ambulate to chair) Equipment Used: Gait belt;Rolling walker Transfers/Ambulation Related to ADLs: min assist with RW ADL Comments: Increased time for transfers/mobility.    OT Diagnosis: Generalized weakness;Acute pain  OT Problem List: Decreased activity tolerance;Decreased knowledge of use of DME or AE;Pain OT Treatment Interventions: Self-care/ADL training;DME and/or AE instruction;Therapeutic activities;Patient/family education   OT Goals Acute Rehab OT Goals OT Goal  Formulation: With patient Time For Goal Achievement: 01/26/12 Potential to Achieve Goals: Good ADL Goals Pt Will Perform Grooming: with supervision;Standing at sink ADL Goal: Grooming - Progress: Goal set today Pt Will Perform Lower Body Bathing: with supervision;Sit to stand from chair;Sit to stand from bed;with adaptive equipment ADL Goal: Lower Body Bathing - Progress: Goal set today Pt Will Perform Lower Body Dressing: with supervision;Sit to stand from chair;Sit to stand from bed;with adaptive equipment ADL Goal: Lower Body Dressing - Progress: Goal set today Pt Will Transfer to Toilet: with supervision;Ambulation;with DME;Comfort height toilet ADL Goal: Toilet Transfer - Progress: Goal set today Pt Will Perform Toileting - Clothing Manipulation: with supervision;Standing ADL Goal: Toileting - Clothing Manipulation - Progress: Goal set today Pt Will Perform Toileting - Hygiene: with supervision;Sit to stand from 3-in-1/toilet ADL Goal: Toileting - Hygiene - Progress: Goal set today Pt Will Perform Tub/Shower Transfer: Shower transfer;with supervision;Ambulation;with DME;Shower seat with back ADL Goal: Tub/Shower Transfer - Progress: Goal set today  Visit Information  Last OT Received On: 01/19/12 Assistance Needed: +1 PT/OT Co-Evaluation/Treatment: Yes    Subjective Data      Prior Functioning     Home Living Lives With: Alone Available Help at Discharge: Family;Available 24 hours/day Type of Home: House Home Access: Stairs to enter [employer] of Steps: 1 Entrance Stairs-Rails: None Home Layout: One level Bathroom Shower/Tub: Health visitor: Handicapped height Bathroom Accessibility: Yes How Accessible: Accessible via walker Home Adaptive Equipment: None Additional Comments: Family present and reporting they will provide 24/7 assist if needed.  Prior Function Level of Independence: Independent Able to Take Stairs?: Yes Driving:  Yes Vocation: Full time employment Comments: landscaper Communication Communication: No difficulties         Vision/Perception     Cognition  Overall Cognitive Status: Appears  within functional limits for tasks assessed/performed Arousal/Alertness: Awake/alert Orientation Level: Appears intact for tasks assessed Behavior During Session: [facility] for tasks performed    Extremity/Trunk Assessment Right Upper Extremity Assessment RUE ROM/Strength/Tone: [facility] for tasks assessed Left Upper Extremity Assessment LUE ROM/Strength/Tone: WFL for tasks assessed Right Lower Extremity Assessment RLE ROM/Strength/Tone: Within functional levels Left Lower Extremity Assessment LLE ROM/Strength/Tone: Deficits;Due to pain LLE Sensation: WFL - Light Touch Trunk Assessment Trunk Assessment: Normal     Mobility Bed Mobility Bed Mobility: Supine to Sit;Sitting - Scoot to Edge of Bed Supine to Sit: 4: Min assist;HOB elevated;With rails (HOB 25degrees) Sitting - Scoot to Edge of Bed: 5: Supervision Details for Bed Mobility Assistance: assist to hook RLE under LLE to use RLE to move LLE across bed with cueing for sequence but once leg hooked pt able to move body to EOB pivoting without physical assist Transfers Transfers: Sit to Stand;Stand to Sit Sit to Stand: 4: Min guard;From bed Stand to Sit: To chair/3-in-1;With armrests;4: Min assist Details for Transfer Assistance: cueing for hand placement and sequence with assist to slide LLE out in front of pt with sitting     Shoulder Instructions     Exercise     Balance     End of Session OT - End of Session Equipment Utilized During Treatment: Gait belt Activity Tolerance: Patient tolerated treatment well Patient left: in chair;with call bell/phone within reach;with family/visitor present Nurse Communication: Mobility status  GO   01/19/2012 Cipriano Mile OTR/L Pager [phone] Office [phone]   Cipriano Mile 01/19/2012, 10:36 AM

## 2012-01-19 NOTE — Progress Notes (Signed)
   Subjective: 1 Day Post-Op Procedure(s) (LRB): INTRAMEDULLARY (IM) NAIL FEMORAL (Left) Patient reports pain as mild.   Patient seen in rounds for Dr. Lequita Halt. Patient is well, and has had no acute complaints or problems We will start therapy today.  Plan is to go home versus SNF after hospital stay.  Objective: Vital signs in last 24 hours: Temp:  [97.3 F (36.3 C)-98.4 F (36.9 C)] 98.2 F (36.8 C) (12/20 0438) Pulse Rate:  [77-87] 83  (12/20 0438) Resp:  [11-19] 18  (12/20 0438) BP: (116-162)/(68-100) 116/68 mmHg (12/20 0438) SpO2:  [92 %-100 %] 98 % (12/20 0438) Weight:  [96.344 kg (212 lb 6.4 oz)] 96.344 kg (212 lb 6.4 oz) (12/20 0438)  Intake/Output from previous day:  Intake/Output Summary (Last 24 hours) at 01/19/12 0634 Last data filed at 01/19/12 0149  Gross per 24 hour  Intake   1335 ml  Output   2250 ml  Net   -915 ml    Intake/Output this shift: Total I/O In: 1260 [P.O.:60; I.V.:1050; IV Piggyback:150] Out: 825 [Urine:525; Blood:300]  Labs:  [facility] 01/18/12 0612 01/17/12 2238  HGB 12.3* 12.5*    Basename 01/18/12 0612 01/17/12 2238  WBC 7.8 9.1  RBC 4.31 4.27  HCT 37.5* 37.5*  PLT 161 156    Basename 01/18/12 0612 01/17/12 2238  NA 138 139  K 3.9 4.0  CL 106 108  CO2 26 25  BUN 12 15  CREATININE 0.94 1.10  GLUCOSE 143* 172*  CALCIUM 8.6 8.2*    Basename 01/17/12 2238  LABPT --  INR 1.06    EXAM General - Patient is Alert, Appropriate and Oriented Extremity - Neurovascular intact Sensation intact distally Dorsiflexion/Plantar flexion intact No cellulitis present Dressing - dressing C/D/I Motor Function - intact, moving foot and toes well on exam.    Past Medical History  Diagnosis Date  . Hypertension     Assessment/Plan: 1 Day Post-Op Procedure(s) (LRB): INTRAMEDULLARY (IM) NAIL FEMORAL (Left) Principal Problem:  *Femur fracture, left Active Problems:  HYPERTENSION  ESOPHAGEAL MOTILITY DISORDER  GERD  Fall at  home  Hip fracture, left  Estimated Body mass index is 27.27 kg/(m^2) as calculated from the following:   Height as of this encounter: 6\' 2" (1.88 m).   Weight as of this encounter: 212 lb 6.4 oz(96.344 kg). Advance diet Up with therapy  DVT Prophylaxis - Lovenox Weight Bearing As Tolerated left Leg  Begin Therapy Home versus SNF depending upon how he does.  Patrica Duel 01/19/2012, 6:34 AM

## 2012-01-19 NOTE — Anesthesia Postprocedure Evaluation (Signed)
Anesthesia Post Note  Patient: Joseph [name]  Procedure(s) Performed: Procedure(s) (LRB): INTRAMEDULLARY (IM) NAIL FEMORAL (Left)  Anesthesia type: General  Patient location: PACU  Post pain: Pain level controlled  Post assessment: Patient's Cardiovascular Status Stable  Last Vitals:  Filed Vitals:   01/19/12 0438  BP: 116/68  Pulse: 83  Temp: 36.8 C  Resp: 18    Post vital signs: Reviewed and stable  Level of consciousness: alert  Complications: No apparent anesthesia complications

## 2012-01-19 NOTE — Progress Notes (Signed)
Utilization review completed.  

## 2012-01-19 NOTE — Progress Notes (Signed)
[facility] PROGRESS NOTE  Joseph [name] EAV:[id number] DOB: December 01, 1931 DOA: 01/17/2012 PCP: Thora Lance, MD  Assessment/Plan:  #1 left subtrochanteric femur fracture Status post IM nail 01/18/2012. Secondary to mechanical fall. Patient denies any syncopal episodes. Patient postop was in stable condition. Pain is controlled. PT/OT. Pain management per orthopedics. Per orthopedics.  #2 hypertension Stable. Continue Toprol.  #3 gastroesophageal reflux disease Stable. PPI.  #4 Fall Mechanical in nature. PT /OT.  #5 prophylaxis PPI for GI prophylaxis. DVT prophylaxis will be deferred to orthopedics.   Code Status: Full Family Communication: Updated patient, wife, daughter at bedside Disposition Plan: Home with home health versus SNF   Consultants:  Orthopedics: Dr. Lequita Halt 01/18/2012  Procedures:  X-ray left femur 01/17/2012  X-ray of the pelvis 01/17/2012  Intramedullary nailing left subtrochanteric femur fracture 01/18/2012  Antibiotics:  None  HPI/Subjective: Patient denies any chest pain, no shortness of breath. Patient states left hip pain is controlled.  Objective: Filed Vitals:   01/18/12 2310 01/19/12 0438 01/19/12 0914 01/19/12 1026  BP: 151/78 116/68 142/62   Pulse: 83 83 82 89  Temp: 98 F (36.7 C) 98.2 F (36.8 C) 98.8 F (37.1 C)   TempSrc: Axillary Axillary    Resp: 16 18 18    Height:      Weight:  96.344 kg (212 lb 6.4 oz)    SpO2: 92% 98% 96% 96%    Intake/Output Summary (Last 24 hours) at 01/19/12 1104 Last data filed at 01/19/12 1000  Gross per 24 hour  Intake   2100 ml  Output   1625 ml  Net    475 ml   Filed Weights   01/18/12 0155 01/19/12 0438  Weight: 87.4 kg (192 lb 10.9 oz) 96.344 kg (212 lb 6.4 oz)    Exam:   General:  NAD  Cardiovascular: RRR. No lower extremity edema.  Respiratory: CTAB  Abdomen: Soft, nontender, nondistended, positive bowel sounds.  Data Reviewed: Basic Metabolic  Panel:  Lab 01/19/12 0610 01/18/12 0612 01/17/12 2238  NA 136 138 139  K 4.5 3.9 4.0  CL 104 106 108  CO2 23 26 25   GLUCOSE 144* 143* 172*  BUN 11 12 15   CREATININE 0.95 0.94 1.10  CALCIUM 8.3* 8.6 8.2*  MG 1.8 1.8 --  PHOS -- -- --   Liver Function Tests:  Lab 01/17/12 2238  AST 16  ALT 7  ALKPHOS 76  BILITOT 0.3  PROT 5.7*  ALBUMIN 3.2*   No results found for this basename: LIPASE:5,AMYLASE:5 in the last 168 hours No results found for this basename: AMMONIA:5 in the last 168 hours CBC:  Lab 01/19/12 0610 01/18/12 0612 01/17/12 2238  WBC 8.0 7.8 9.1  NEUTROABS -- -- 7.6  HGB 11.4* 12.3* 12.5*  HCT 34.5* 37.5* 37.5*  MCV 88.7 87.0 87.8  PLT 138* 161 156   Cardiac Enzymes: No results found for this basename: CKTOTAL:5,CKMB:5,CKMBINDEX:5,TROPONINI:5 in the last 168 hours BNP (last 3 results) No results found for this basename: PROBNP:3 in the last 8760 hours CBG:  Lab 01/17/12 2149  GLUCAP 277*    Recent Results (from the past 240 hour(s))  SURGICAL PCR SCREEN     Status: Normal   Collection Time   01/18/12  7:12 PM      Component Value Range Status Comment   MRSA, PCR NEGATIVE  NEGATIVE Final    Staphylococcus aureus NEGATIVE  NEGATIVE Final      Studies: Dg Pelvis 1-2 Views  01/17/2012  *RADIOLOGY REPORT*  Clinical Data: Left femur pain  PELVIS - 1-2 VIEW  Comparison: Contemporaneous femur radiographs  Findings: Comminuted and displaced intertrochanteric left femur fracture with subtrochanteric extension as described on contemporaneous femur radiographs.  Diffuse osteopenia.  No additional fracture or dislocation.  Sacrum is partially obscured by overlying bowel.  Lower lumbar degenerative changes.  IMPRESSION: Left intertrochanteric femur fracture with subtrochanteric extension.  No additional fracture identified.   Original Report Authenticated By: Jearld Lesch, M.D.    Dg Femur Left  01/19/2012  *RADIOLOGY REPORT*  Clinical Data: Intramedullary  nail fixation of the left femur  LEFT FEMUR - 2 VIEW, DG C-ARM 1-60 MIN  Comparison: Left femur radiographs - 01/17/2012  Findings: Six spot fluoroscopic intraoperative images of the left femur are provided for review.  Images demonstrate intramedullary nail fixation of the previously noted comminuted proximal diaphyseal / intertrochanteric femur fracture.  There is dynamic screw fixation of the left femoral neck.  The distal end of the intramedullary nail is transfixed with a cancellous screw.  Alignment appears near anatomic.  There is a very minimal amount of expected subcutaneous emphysema about the operative site.  No radiopaque foreign body.  IMPRESSION: Post intramedullary nail fixation of comminuted proximal left diaphysis / intertrochanteric femur fracture.   Original Report Authenticated By: Tacey Ruiz, MD    Dg Femur Left  01/17/2012  *RADIOLOGY REPORT*  Clinical Data: Left femur pain status post fall.  LEFT FEMUR - 2 VIEW  Comparison: None.  Findings: There is a comminuted and displaced intertrochanteric fracture with subtrochanteric extension.  The distal component is displaced medially and superiorly.  The proximal component is abducted.  No hip joint dislocation.  Left knee arthroplasty is partially imaged.  Osteopenia.  IMPRESSION: Comminuted/displaced fracture of the left intertrochanteric femur with subtrochanteric extension.   Original Report Authenticated By: Jearld Lesch, M.D.    Dg C-arm 1-60 Min  01/19/2012  *RADIOLOGY REPORT*  Clinical Data: Intramedullary nail fixation of the left femur  LEFT FEMUR - 2 VIEW, DG C-ARM 1-60 MIN  Comparison: Left femur radiographs - 01/17/2012  Findings: Six spot fluoroscopic intraoperative images of the left femur are provided for review.  Images demonstrate intramedullary nail fixation of the previously noted comminuted proximal diaphyseal / intertrochanteric femur fracture.  There is dynamic screw fixation of the left femoral neck.  The distal  end of the intramedullary nail is transfixed with a cancellous screw.  Alignment appears near anatomic.  There is a very minimal amount of expected subcutaneous emphysema about the operative site.  No radiopaque foreign body.  IMPRESSION: Post intramedullary nail fixation of comminuted proximal left diaphysis / intertrochanteric femur fracture.   Original Report Authenticated By: Tacey Ruiz, MD     Scheduled Meds:    . acetaminophen  1,000 mg Intravenous Q6H  . docusate sodium  100 mg Oral BID  . enoxaparin (LOVENOX) injection  40 mg Subcutaneous Q24H  . metoprolol succinate  100 mg Oral Daily  . omeprazole  20 mg Oral Daily   Continuous Infusions:    . sodium chloride 75 mL/hr (01/18/12 0751)    Principal Problem:  *Femur fracture, left Active Problems:  HYPERTENSION  ESOPHAGEAL MOTILITY DISORDER  GERD  Fall at home  Hip fracture, left    Time spent: . 35 mins    [facility]  [facility] Pager [phone]. If 8PM-8AM, please contact night-coverage at [email or web address], password [facility] 01/19/2012, 11:04 AM  LOS: 2 days

## 2012-01-19 NOTE — Progress Notes (Signed)
PT is recommending home supervision. Clinical Social Worker will sign off for now as social work intervention is no longer needed. Please consult [place] again if new need arises.   Sabino Niemann, MSW [phone]

## 2012-01-19 NOTE — Care Management Note (Signed)
    Page 1 of 2   01/19/2012     12:19:03 PM   CARE MANAGEMENT NOTE 01/19/2012  Patient:  Joseph [name], Joseph [name]   Account Number:  [id number]  Date Initiated:  01/19/2012  Documentation initiated by:  Donn Pierini  Subjective/Objective Assessment:   Pt admitted with hip fx- s/p repair     Action/Plan:   PTA pt lived at home alone- PT/OT evals-   Anticipated DC Date:  01/20/2012   Anticipated DC Plan:  HOME W HOME HEALTH SERVICES      DC Planning Services  CM consult      [facility] Choice  HOME HEALTH  DURABLE MEDICAL EQUIPMENT   Choice offered to / List presented to:  [name]   DME arranged  3-N-1  TUB BENCH  WALKER - ROLLING        HH arranged  HH-2 PT      Status of service:  In process, will continue to follow Medicare Important Message given?   (If response is "NO", the following Medicare IM given date fields will be blank) Date Medicare IM given:   Date Additional Medicare IM given:    Discharge Disposition:    Per UR Regulation:  Reviewed for med. necessity/level of care/duration of stay  If discussed at Long Length of Stay Meetings, dates discussed:    Comments:  01/19/12- 1215- Donn Pierini RN, BSN [phone] Spoke with pt at bedside regarding d/c plans- plan is to return home with family available to assist- DME ordered pt will need 3n1, RW, tub seat, list of HH agencies for [facility] given to pt - who wants to review with son- HH-PT ordered and referral will be made once pt choice of agency made. pt to tx to 5N today.

## 2012-01-19 NOTE — Evaluation (Signed)
Physical Therapy Evaluation Patient Details Name: Joseph [name] MRN: [id number] DOB: 11-Nov-1931 Today's Date: 01/19/2012 Time: 4098-1191 PT Time Calculation (min): 18 min  PT Assessment / Plan / Recommendation Clinical Impression  Pt admitted s/p fall off stool with Left femur fx s/p IM nail. Pt very motivated and mobilizing well and should progress very quickly. Pt encouraged to be OOB for all meals and to increase mobility with nursing staff. Will follow acutely to maximize mobility, gait and function to increase independence.     PT Assessment  Patient needs continued PT services    Follow Up Recommendations  Home health PT    Does the patient have the potential to tolerate intense rehabilitation      Barriers to Discharge None      Equipment Recommendations  Rolling walker with 5" wheels    Recommendations for Other Services     Frequency Min 5X/week    Precautions / Restrictions Precautions Precautions: Fall Restrictions Weight Bearing Restrictions: Yes LLE Weight Bearing: Weight bearing as tolerated   Pertinent Vitals/Pain No pain with gait or mobility HR 89, sats 96% on RA      Mobility  Bed Mobility Bed Mobility: Supine to Sit;Sitting - Scoot to Edge of Bed Supine to Sit: 4: Min assist;HOB elevated;With rails (HOB 25degrees) Sitting - Scoot to Edge of Bed: 5: Supervision Details for Bed Mobility Assistance: assist to hook RLE under LLE to use RLE to move LLE across bed with cueing for sequence but once leg hooked pt able to move body to EOB pivoting without physical assist Transfers Transfers: Sit to Stand;Stand to Sit Sit to Stand: 4: Min guard;From bed Stand to Sit: To chair/3-in-1;With armrests;4: Min assist Details for Transfer Assistance: cueing for hand placement and sequence with assist to slide LLE out in front of pt with sitting Ambulation/Gait Ambulation/Gait Assistance: 4: Min guard Ambulation Distance (Feet): 20 Feet Assistive device:  Rolling walker Ambulation/Gait Assistance Details: cueing for sequence Gait Pattern: Step-to pattern Gait velocity: decreased Stairs: No    Shoulder Instructions     Exercises     PT Diagnosis: Difficulty walking  PT Problem List: Decreased activity tolerance;Decreased knowledge of use of DME;Decreased mobility PT Treatment Interventions: Gait training;Stair training;DME instruction;Functional mobility training;Therapeutic activities;Therapeutic exercise;Patient/family education   PT Goals Acute Rehab PT Goals PT Goal Formulation: With patient Time For Goal Achievement: 01/26/12 Potential to Achieve Goals: Good Pt will go Supine/Side to Sit: with modified independence;with HOB 0 degrees PT Goal: Supine/Side to Sit - Progress: Goal set today Pt will go Sit to Supine/Side: with modified independence;with HOB 0 degrees PT Goal: Sit to Supine/Side - Progress: Goal set today Pt will go Sit to Stand: with modified independence PT Goal: Sit to Stand - Progress: Goal set today Pt will go Stand to Sit: with modified independence PT Goal: Stand to Sit - Progress: Goal set today Pt will Ambulate: >150 feet;with modified independence;with least restrictive assistive device PT Goal: Ambulate - Progress: Goal set today Pt will Go Up / Down Stairs: 1-2 stairs;with min assist;with least restrictive assistive device PT Goal: Up/Down Stairs - Progress: Goal set today  Visit Information  Last PT Received On: 01/19/12 Assistance Needed: +1    Subjective Data  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  Prior Function Level of Independence: Independent Able to Take Stairs?: Yes Driving: Yes Vocation: Full time employment Comments: landscaper Communication Communication: No difficulties    Cognition  Overall Cognitive Status: Appears within functional limits for tasks assessed/performed Arousal/Alertness: Awake/alert Orientation Level: Appears intact for tasks assessed Behavior During Session: [facility] for tasks performed    Extremity/Trunk Assessment Right Lower Extremity Assessment RLE ROM/Strength/Tone: Within functional levels Left Lower Extremity Assessment LLE ROM/Strength/Tone: Deficits;Due to pain LLE Sensation: WFL - Light Touch Trunk Assessment Trunk Assessment: Normal   Balance    End of Session PT - End of Session Equipment Utilized During Treatment: Gait belt Activity Tolerance: Patient tolerated treatment well Patient left: in chair;with call bell/phone within reach;with family/visitor present  GP     Toney Sang Beth 01/19/2012, 10:31 AM  Delaney Meigs, PT [phone]

## 2012-01-19 NOTE — Progress Notes (Signed)
Orthopedic Tech Progress Note Patient Details:  [name] [birth year] [id number]  Patient ID: Joseph [name], male   DOB: [birth year], 78 y.o.   MRN: [id number]   Shawnie Pons 01/19/2012, 2:02 PM [facility] [place] Esters

## 2012-01-20 DIAGNOSIS — Y92009 Unspecified place in unspecified non-institutional (private) residence as the place of occurrence of the external cause: Secondary | ICD-10-CM

## 2012-01-20 LAB — CBC
HCT: 29.1 % — ABNORMAL LOW (ref 39.0–52.0)
Platelets: 139 10*3/uL — ABNORMAL LOW (ref 150–400)
RBC: 3.35 MIL/uL — ABNORMAL LOW (ref 4.22–5.81)
RDW: 13.1 % (ref 11.5–15.5)
WBC: 7.2 10*3/uL (ref 4.0–10.5)

## 2012-01-20 LAB — BASIC METABOLIC PANEL
BUN: 15 mg/dL (ref 6–23)
Chloride: 105 mEq/L (ref 96–112)
GFR calc Af Amer: 78 mL/min — ABNORMAL LOW (ref >90)
Potassium: 3.4 mEq/L — ABNORMAL LOW (ref 3.5–5.1)

## 2012-01-20 NOTE — Progress Notes (Signed)
[facility] PROGRESS NOTE  Joseph [name] BJY:[id number] DOB: August 03, 1931 DOA: 01/17/2012 PCP: Thora Lance, MD  Assessment/Plan:  #1 left subtrochanteric femur fracture, s/p L IMK nailing 12/19 Status post IM nail 01/18/2012. Secondary to mechanical fall. Patient denies any syncopal episodes. Patient postop was in stable condition. Pain is controlled- PT/OT. Pain management per orthopedics. Per orthopedics.  WBAT per PT/OT seems like a d/c home     #2 hypertension Stable. Continue Toprol XL 100 daily  #3 gastroesophageal reflux disease Stable. PPI.  #4 Fall Mechanical in nature. PT /OT.  #5 prophylaxis PPI for GI prophylaxis. DVT prophylaxis will be deferred to orthopedics-right now on Lovenox   Code Status: Full Family Communication: Updated patient, wife, daughter at bedside Disposition Plan: Home with home health    Consultants:  Orthopedics: Dr. Lequita Halt 01/18/2012  Procedures:  X-ray left femur 01/17/2012  X-ray of the pelvis 01/17/2012  Intramedullary nailing left subtrochanteric femur fracture 01/18/2012  Antibiotics:  None  HPI/Subjective: Patient denies any chest pain, no shortness of breath. Patient states left hip pain is controlled. Mild N this am.  Tolerate dOJ Eating and drinking   Objective: Filed Vitals:   01/19/12 2103 01/20/12 0000 01/20/12 0400 01/20/12 0536  BP: 125/60   103/78  Pulse: 75   88  Temp: 98.4 F (36.9 C)   98.9 F (37.2 C)  TempSrc:      Resp: 16 16 16 18   Height:      Weight:      SpO2: 96% 96% 96% 95%    Intake/Output Summary (Last 24 hours) at 01/20/12 0812 Last data filed at 01/19/12 1300  Gross per 24 hour  Intake    240 ml  Output    450 ml  Net   -210 ml   Filed Weights   01/18/12 0155 01/19/12 0438  Weight: 87.4 kg (192 lb 10.9 oz) 96.344 kg (212 lb 6.4 oz)    Exam:   General:  NAD  Cardiovascular: RRR. No lower extremity edema.  Respiratory: CTAB  Abdomen: Soft, nontender,  nondistended, positive bowel sounds.  Post op changes to L side  Data Reviewed: Basic Metabolic Panel:  Lab 01/20/12 0865 01/19/12 0610 01/18/12 0612 01/17/12 2238  NA 137 136 138 139  K 3.4* 4.5 3.9 4.0  CL 105 104 106 108  CO2 24 23 26 25   GLUCOSE 136* 144* 143* 172*  BUN 15 11 12 15   CREATININE 1.03 0.95 0.94 1.10  CALCIUM 8.1* 8.3* 8.6 8.2*  MG -- 1.8 1.8 --  PHOS -- -- -- --   Liver Function Tests:  Lab 01/17/12 2238  AST 16  ALT 7  ALKPHOS 76  BILITOT 0.3  PROT 5.7*  ALBUMIN 3.2*   No results found for this basename: LIPASE:5,AMYLASE:5 in the last 168 hours No results found for this basename: AMMONIA:5 in the last 168 hours CBC:  Lab 01/19/12 0610 01/18/12 0612 01/17/12 2238  WBC 8.0 7.8 9.1  NEUTROABS -- -- 7.6  HGB 11.4* 12.3* 12.5*  HCT 34.5* 37.5* 37.5*  MCV 88.7 87.0 87.8  PLT 138* 161 156   Cardiac Enzymes: No results found for this basename: CKTOTAL:5,CKMB:5,CKMBINDEX:5,TROPONINI:5 in the last 168 hours BNP (last 3 results) No results found for this basename: PROBNP:3 in the last 8760 hours CBG:  Lab 01/17/12 2149  GLUCAP 277*    Recent Results (from the past 240 hour(s))  SURGICAL PCR SCREEN     Status: Normal   Collection Time   01/18/12  7:12 PM      Component Value Range Status Comment   MRSA, PCR NEGATIVE  NEGATIVE Final    Staphylococcus aureus NEGATIVE  NEGATIVE Final      Studies: Dg Femur Left  01/19/2012  *RADIOLOGY REPORT*  Clinical Data: Intramedullary nail fixation of the left femur  LEFT FEMUR - 2 VIEW, DG C-ARM 1-60 MIN  Comparison: Left femur radiographs - 01/17/2012  Findings: Six spot fluoroscopic intraoperative images of the left femur are provided for review.  Images demonstrate intramedullary nail fixation of the previously noted comminuted proximal diaphyseal / intertrochanteric femur fracture.  There is dynamic screw fixation of the left femoral neck.  The distal end of the intramedullary nail is transfixed with a  cancellous screw.  Alignment appears near anatomic.  There is a very minimal amount of expected subcutaneous emphysema about the operative site.  No radiopaque foreign body.  IMPRESSION: Post intramedullary nail fixation of comminuted proximal left diaphysis / intertrochanteric femur fracture.   Original Report Authenticated By: Tacey Ruiz, MD    Dg C-arm 1-60 Min  01/19/2012  *RADIOLOGY REPORT*  Clinical Data: Intramedullary nail fixation of the left femur  LEFT FEMUR - 2 VIEW, DG C-ARM 1-60 MIN  Comparison: Left femur radiographs - 01/17/2012  Findings: Six spot fluoroscopic intraoperative images of the left femur are provided for review.  Images demonstrate intramedullary nail fixation of the previously noted comminuted proximal diaphyseal / intertrochanteric femur fracture.  There is dynamic screw fixation of the left femoral neck.  The distal end of the intramedullary nail is transfixed with a cancellous screw.  Alignment appears near anatomic.  There is a very minimal amount of expected subcutaneous emphysema about the operative site.  No radiopaque foreign body.  IMPRESSION: Post intramedullary nail fixation of comminuted proximal left diaphysis / intertrochanteric femur fracture.   Original Report Authenticated By: Tacey Ruiz, MD     Scheduled Meds:    . docusate sodium  100 mg Oral BID  . enoxaparin (LOVENOX) injection  40 mg Subcutaneous Q24H  . metoprolol succinate  100 mg Oral Daily  . omeprazole  20 mg Oral Daily   Continuous Infusions:    Principal Problem:  *Femur fracture, left Active Problems:  HYPERTENSION  ESOPHAGEAL MOTILITY DISORDER  GERD  Fall at home  Hip fracture, left    Time spent: . 35 mins    Mahala Menghini [facility]  [facility] Pager [phone]. If 8PM-8AM, please contact night-coverage at [email or web address], password [facility] 01/20/2012, 8:12 AM  LOS: 3 days

## 2012-01-20 NOTE — Progress Notes (Signed)
Physical Therapy Treatment Patient Details Name: Joseph [name] MRN: [id number] DOB: 1931/02/24 Today's Date: 01/20/2012 Time: 4098-1191 PT Time Calculation (min): 33 min  PT Assessment / Plan / Recommendation Comments on Treatment Session  Pt making steady progress toward goals with less cues/assistance needed today with mobility and initiation of exercises today.    Follow Up Recommendations  Home health PT           Equipment Recommendations  Rolling walker with 5" wheels       Frequency Min 5X/week   Plan Discharge plan remains appropriate;Frequency remains appropriate    Precautions / Restrictions Precautions Precautions: Fall Restrictions LLE Weight Bearing: Weight bearing as tolerated    Pertinent Vitals/Pain Rated his left hip pain as 8/10 after session. Cryo applied and RN notified. PO pain medicine given by RN after session.    Mobility  Bed Mobility Supine to Sit: 4: Min assist;HOB elevated;With rails Sitting - Scoot to Edge of Bed: 5: Supervision Details for Bed Mobility Assistance: assist to move left leg to edge and off bed. pt able to move his pelvis and trunk without assistance (cues for technique only) and was able to elevate his trunk into sitting with cues only. Transfers Sit to Stand: 4: Min guard;From bed;With upper extremity assist Stand to Sit: 4: Min guard;To chair/3-in-1;With upper extremity assist;With armrests Details for Transfer Assistance: cues for hand placment with transfers and for left LE placement with sitting down into the chair. Ambulation/Gait Ambulation/Gait Assistance: 4: Min guard Ambulation Distance (Feet): 20 Feet Assistive device: Rolling walker Ambulation/Gait Assistance Details: min vc's for posture, gait sequence and walker proximity with gait. Gait Pattern: Step-to pattern;Decreased stance time - left;Decreased step length - right Gait velocity: Decreased    Exercises Total Joint Exercises Ankle Circles/Pumps:  AROM;Both;10 reps;Supine Quad Sets: AROM;Strengthening;Left;10 reps;Supine Heel Slides: AAROM;Strengthening;Left;10 reps;Supine Hip ABduction/ADduction: AAROM;Strengthening;Left;10 reps;Supine     PT Goals Acute Rehab PT Goals PT Goal: Supine/Side to Sit - Progress: Progressing toward goal PT Goal: Sit to Stand - Progress: Progressing toward goal PT Goal: Stand to Sit - Progress: Progressing toward goal PT Goal: Ambulate - Progress: Progressing toward goal  Visit Information  Last PT Received On: 01/20/12 Assistance Needed: +1    Subjective Data  Subjective: Reports not being able to reach his urinal and nursing not getting there in time and having accident in bed as a result. Otherwise, no new complaints and agreeable to PT at this time.   Cognition  Overall Cognitive Status: Appears within functional limits for tasks assessed/performed Arousal/Alertness: Awake/alert Orientation Level: Appears intact for tasks assessed Behavior During Session: [facility] for tasks performed       End of Session PT - End of Session Equipment Utilized During Treatment: Gait belt Activity Tolerance: Patient tolerated treatment well Patient left: in chair;with call bell/phone within reach;with family/visitor present Nurse Communication: Mobility status;Patient requests pain meds   GP     Sallyanne Kuster 01/20/2012, 12:54 PM  Sallyanne Kuster, PTA Office- [phone]

## 2012-01-20 NOTE — Progress Notes (Signed)
Subjective: 2 Days Post-Op Procedure(s) (LRB): INTRAMEDULLARY (IM) NAIL FEMORAL (Left) Patient reports pain as mild.   Sitting up on side of bed eating. Ambulated yesterday  Objective: Vital signs in last 24 hours: Temp:  [98.4 F (36.9 C)-98.9 F (37.2 C)] 98.9 F (37.2 C) (12/21 0536) Pulse Rate:  [75-89] 88  (12/21 0536) Resp:  [16-20] 18  (12/21 0536) BP: (103-142)/(57-78) 103/78 mmHg (12/21 0536) SpO2:  [95 %-96 %] 95 % (12/21 0536)  Intake/Output from previous day: 12/20 0701 - 12/21 0700 In: 480 [P.O.:480] Out: 1225 [Urine:1225] Intake/Output this shift:     Basename 01/19/12 0610 01/18/12 0612 01/17/12 2238  HGB 11.4* 12.3* 12.5*    Basename 01/19/12 0610 01/18/12 0612  WBC 8.0 7.8  RBC 3.89* 4.31  HCT 34.5* 37.5*  PLT 138* 161    Basename 01/20/12 0440 01/19/12 0610  NA 137 136  K 3.4* 4.5  CL 105 104  CO2 24 23  BUN 15 11  CREATININE 1.03 0.95  GLUCOSE 136* 144*  CALCIUM 8.1* 8.3*    Basename 01/17/12 2238  LABPT --  INR 1.06    Neurologically intact Neurovascular intact No cellulitis present Compartment soft Dressings C/D/I  Assessment/Plan: 2 Days Post-Op Procedure(s) (LRB): INTRAMEDULLARY (IM) NAIL FEMORAL (Left) Advance diet Up with therapy D/C IV fluids Discharge home with home health probably on 12/23  Maverick Patman V 01/20/2012, 9:02 AM

## 2012-01-21 LAB — CBC
HCT: 27.1 % — ABNORMAL LOW (ref 39.0–52.0)
Hemoglobin: 9.2 g/dL — ABNORMAL LOW (ref 13.0–17.0)
WBC: 6.7 10*3/uL (ref 4.0–10.5)

## 2012-01-21 MED ORDER — SORBITOL 70 % SOLN: 30.0000 mL | Freq: Every day | Status: DC | PRN

## 2012-01-21 MED ORDER — OXYCODONE HCL 5 MG PO TABS
5.0000 mg | ORAL_TABLET | Freq: Three times a day (TID) | ORAL | Status: DC | PRN
  Administered 2012-01-21 – 2012-01-22 (×3): 10 mg via ORAL
  Filled 2012-01-21 (×3): qty 2

## 2012-01-21 MED ORDER — TRAMADOL HCL 50 MG PO TABS: 50.0000 mg | ORAL_TABLET | Freq: Four times a day (QID) | ORAL | Status: DC | PRN

## 2012-01-21 MED ORDER — SORBITOL 70 % SOLN
30.0000 mL | Freq: Every day | Status: DC | PRN
  Administered 2012-01-21: 30 mL via ORAL
  Filled 2012-01-21: qty 30

## 2012-01-21 MED ORDER — ENOXAPARIN SODIUM 40 MG/0.4ML ~~LOC~~ SOLN: 40.0000 mg | SUBCUTANEOUS | Status: DC

## 2012-01-21 MED ORDER — OXYCODONE HCL 5 MG PO TABS: 5.0000 mg | ORAL_TABLET | Freq: Three times a day (TID) | ORAL | Status: DC | PRN

## 2012-01-21 MED ORDER — METOCLOPRAMIDE HCL 5 MG PO TABS: 5.0000 mg | ORAL_TABLET | Freq: Three times a day (TID) | ORAL | Status: DC | PRN

## 2012-01-21 NOTE — Discharge Summary (Addendum)
Physician Discharge Summary  Joseph [name]:[id number] DOB: 1931/07/11 DOA: 01/17/2012  PCP: Thora Lance, MD  Admit date: 01/17/2012 Discharge date: 01/22/2012  Time spent: 35 minutes  Recommendations for Outpatient Follow-up:  1. Short-term SNF. 2. Limit Opiates, as this might have contributed to nausea. 3. Continue Lovenox for 14 days as per Orthopedics 4. Out-patient surveillance for Esophagitis-continue PPI  Discharge Diagnoses:  Principal Problem:  *Femur fracture, left Active Problems:  HYPERTENSION  ESOPHAGEAL MOTILITY DISORDER  GERD  Fall at home  Hip fracture, left   Discharge Condition: Good  Diet recommendation: Low salt heart healthy  Filed Weights   01/18/12 0155 01/19/12 0438  Weight: 87.4 kg (192 lb 10.9 oz) 96.344 kg (212 lb 6.4 oz)    History of present illness:  78-year-old male with history of dysphagia at least a changing battery home on 01/18/2012 on step so when he lost his balance on a 3 foot stool and fell. He hit his left hip. Was noted he had a mechanical fall and a comminuted displaced fracture of the left intertrochanteric femur with subtrochanteric extension. He was cleared from a standpoint for hip surgery and was operated upon 01/18/2012. Peri-op beta blockade was continued.   Hospital Course:  1. left subtrochanteric femur fracture, s/p L IMK nailing 12/19 Status post IM nail 01/18/2012. Secondary to mechanical fall. Patient denies any syncopal episodes. Patient postop was in stable condition. Pain is controlled- PT/OT. Pain management per orthopedics. Per orthopedics. WBAT per PT/OT seems like a d/c home-family is to discuss what capabilities are at home in terms of 24-hour supervision. He does have some steps at home and was not feeling well enough to ambulate therefore he will benefit from short-term skilled stay. 2. Nausea/Lightheadedness-multifactorial and related to #1 opiates #2 lack of passage of stool.  He will be given  Reglan as a discharge medication and also we will attempt to limit narcotics and use only tramadol for severe pain. Placed on sorbitol for constipation.. 3.  hypertension Stable. Continue Toprol XL 100 daily-orthostatics were performed in the hospital and these were negative 4.  gastroesophageal reflux disease Stable. PPI.  5.  Fall Mechanical in nature. PT /OT.  6.  Prophylaxis PPI for GI prophylaxis. DVT prophylaxis will be deferred to orthopedics-right now on Lovenox   Procedures:  Status post surgery 01/18/2012  Consultations:  Orthopedics  Discharge Exam: Filed Vitals:   01/21/12 1600 01/21/12 1637 01/21/12 2223 01/22/12 0611  BP:  140/73 126/65 127/74  Pulse:  81 82 85  Temp:  98.7 F (37.1 C) 98.6 F (37 C) 98.5 F (36.9 C)  TempSrc:      Resp: 16  18 18   Height:      Weight:      SpO2: 100% 100% 100% 98%   Alert pleasant oriented no apparent distress Feels a little quesy. Ambulated only 5 steps today  General: Well no issues Cardiovascular: S1-S2 no murmur Respiratory: cta b  Discharge Instructions      Discharge Orders    Future Orders Please Complete By Expires   Diet - low sodium heart healthy      Diet - low sodium heart healthy      Increase activity slowly      Call MD for:  temperature >100.4      Call MD for:  persistant nausea and vomiting      Call MD for:  severe uncontrolled pain      Call MD for:  difficulty breathing, headache or  visual disturbances      Call MD for:  persistant dizziness or light-headedness      Call MD / Call 911      Comments:   If you experience chest pain or shortness of breath, CALL 911 and be transported to the hospital emergency room.  If you develope a fever above 101 F, pus (white drainage) or increased drainage or redness at the wound, or calf pain, call your surgeon's office.   Discharge instructions      Comments:   Pick up stool softner and laxative for home. Do not submerge incision under water. May  shower. Continue to use ice for pain and swelling from surgery. Continue Lovenox for five more days, then may discontinue the Lovenox injections.   Increase activity slowly as tolerated      Patient may shower      Comments:   You may shower without a dressing once there is no drainage.  Do not wash over the wound.  If drainage remains, do not shower until drainage stops.   Weight bearing as tolerated      Driving restrictions      Comments:   No driving until released by the physician.   Lifting restrictions      Comments:   No lifting until released by the physician.   Follow the hip precautions as taught in Physical Therapy      Change dressing      Comments:   You may change your dressing dressing daily with sterile 4 x 4 inch gauze dressing and paper tape.  Do not submerge the incision under water.   TED hose      Comments:   Use stockings (TED hose) for 3 weeks on both leg(s).  You may remove them at night for sleeping.   Do not sit on low chairs, stoools or toilet seats, as it may be difficult to get up from low surfaces      Patient may shower      Comments:   You may shower without a dressing once there is no drainage.  Do not wash over the wound.  If drainage remains, do not shower until drainage stops.   Weight bearing as tolerated      Driving restrictions      Comments:   No driving until released by the physician.   Lifting restrictions      Comments:   No lifting until released by the physician.   Follow the hip precautions as taught in Physical Therapy      Change dressing      Comments:   You may change your dressing dressing daily with sterile 4 x 4 inch gauze dressing and paper tape.  Do not submerge the incision under water.   TED hose      Comments:   Use stockings (TED hose) for 3 weeks on both leg(s).  You may remove them at night for sleeping.   Do not sit on low chairs, stoools or toilet seats, as it may be difficult to get up from low surfaces       Discharge instructions      Comments:   Pick up stool softner and laxative for home. Do not submerge incision under water. May shower. Continue to use ice for pain and swelling from surgery. Daily dressing change.  Continue Lovenox injections for five more days then switch over to a 325 mg Aspirin by mouth for 4 more weeks.  Increase activity slowly as tolerated      Increase activity slowly          Medication List     As of 01/22/2012  8:15 AM    TAKE these medications         enoxaparin 40 MG/0.4ML injection   Commonly known as: LOVENOX   Inject 0.4 mLs (40 mg total) into the skin daily.      esomeprazole 20 MG capsule   Commonly known as: NEXIUM   Take 20 mg by mouth daily as needed. For acid reflux      metoCLOPramide 5 MG tablet   Commonly known as: REGLAN   Take 1 tablet (5 mg total) by mouth every 8 (eight) hours as needed (if ondansetron (ZOFRAN) ineffective.).      metoprolol succinate 100 MG 24 hr tablet   Commonly known as: TOPROL-XL   Take 100 mg by mouth daily. Take with or immediately following a meal.      sorbitol 70 % Soln   Take 30 mLs by mouth daily as needed.      traMADol 50 MG tablet   Commonly known as: ULTRAM   Take 1-2 tablets (50-100 mg total) by mouth every 6 (six) hours as needed.         Follow-up Information    Follow up with Loanne Drilling, MD. Schedule an appointment as soon as possible for a visit on 02/01/2012. (Call [phone] today or tomorrow to make the appointment)    Contact information:   [address], [address] [place] [address] [phone]       Follow up with Thora Lance, MD. Schedule an appointment as soon as possible for a visit in 5 days.   Contact information:   [address] [place] [address] [phone]           The results of significant diagnostics from this hospitalization (including imaging, microbiology, ancillary and laboratory) are listed  below for reference.    Significant Diagnostic Studies: Dg Pelvis 1-2 Views  01/17/2012  *RADIOLOGY REPORT*  Clinical Data: Left femur pain  PELVIS - 1-2 VIEW  Comparison: Contemporaneous femur radiographs  Findings: Comminuted and displaced intertrochanteric left femur fracture with subtrochanteric extension as described on contemporaneous femur radiographs.  Diffuse osteopenia.  No additional fracture or dislocation.  Sacrum is partially obscured by overlying bowel.  Lower lumbar degenerative changes.  IMPRESSION: Left intertrochanteric femur fracture with subtrochanteric extension.  No additional fracture identified.   Original Report Authenticated By: Jearld Lesch, M.D.    Dg Femur Left  01/19/2012  *RADIOLOGY REPORT*  Clinical Data: Intramedullary nail fixation of the left femur  LEFT FEMUR - 2 VIEW, DG C-ARM 1-60 MIN  Comparison: Left femur radiographs - 01/17/2012  Findings: Six spot fluoroscopic intraoperative images of the left femur are provided for review.  Images demonstrate intramedullary nail fixation of the previously noted comminuted proximal diaphyseal / intertrochanteric femur fracture.  There is dynamic screw fixation of the left femoral neck.  The distal end of the intramedullary nail is transfixed with a cancellous screw.  Alignment appears near anatomic.  There is a very minimal amount of expected subcutaneous emphysema about the operative site.  No radiopaque foreign body.  IMPRESSION: Post intramedullary nail fixation of comminuted proximal left diaphysis / intertrochanteric femur fracture.   Original Report Authenticated By: Tacey Ruiz, MD    Dg Femur Left  01/17/2012  *RADIOLOGY REPORT*  Clinical Data:  Left femur pain status post fall.  LEFT FEMUR - 2 VIEW  Comparison: None.  Findings: There is a comminuted and displaced intertrochanteric fracture with subtrochanteric extension.  The distal component is displaced medially and superiorly.  The proximal component is  abducted.  No hip joint dislocation.  Left knee arthroplasty is partially imaged.  Osteopenia.  IMPRESSION: Comminuted/displaced fracture of the left intertrochanteric femur with subtrochanteric extension.   Original Report Authenticated By: Jearld Lesch, M.D.    Dg C-arm 1-60 Min  01/19/2012  *RADIOLOGY REPORT*  Clinical Data: Intramedullary nail fixation of the left femur  LEFT FEMUR - 2 VIEW, DG C-ARM 1-60 MIN  Comparison: Left femur radiographs - 01/17/2012  Findings: Six spot fluoroscopic intraoperative images of the left femur are provided for review.  Images demonstrate intramedullary nail fixation of the previously noted comminuted proximal diaphyseal / intertrochanteric femur fracture.  There is dynamic screw fixation of the left femoral neck.  The distal end of the intramedullary nail is transfixed with a cancellous screw.  Alignment appears near anatomic.  There is a very minimal amount of expected subcutaneous emphysema about the operative site.  No radiopaque foreign body.  IMPRESSION: Post intramedullary nail fixation of comminuted proximal left diaphysis / intertrochanteric femur fracture.   Original Report Authenticated By: Tacey Ruiz, MD     Microbiology: Recent Results (from the past 240 hour(s))  SURGICAL PCR SCREEN     Status: Normal   Collection Time   01/18/12  7:12 PM      Component Value Range Status Comment   MRSA, PCR NEGATIVE  NEGATIVE Final    Staphylococcus aureus NEGATIVE  NEGATIVE Final      Labs: Basic Metabolic Panel:  Lab 01/20/12 1610 01/19/12 0610 01/18/12 0612 01/17/12 2238  NA 137 136 138 139  K 3.4* 4.5 3.9 4.0  CL 105 104 106 108  CO2 24 23 26 25   GLUCOSE 136* 144* 143* 172*  BUN 15 11 12 15   CREATININE 1.03 0.95 0.94 1.10  CALCIUM 8.1* 8.3* 8.6 8.2*  MG -- 1.8 1.8 --  PHOS -- -- -- --   Liver Function Tests:  Lab 01/17/12 2238  AST 16  ALT 7  ALKPHOS 76  BILITOT 0.3  PROT 5.7*  ALBUMIN 3.2*   No results found for this basename:  LIPASE:5,AMYLASE:5 in the last 168 hours No results found for this basename: AMMONIA:5 in the last 168 hours CBC:  Lab 01/21/12 0555 01/20/12 0440 01/19/12 0610 01/18/12 0612 01/17/12 2238  WBC 6.7 7.2 8.0 7.8 9.1  NEUTROABS -- -- -- -- 7.6  HGB 9.2* 9.8* 11.4* 12.3* 12.5*  HCT 27.1* 29.1* 34.5* 37.5* 37.5*  MCV 87.7 86.9 88.7 87.0 87.8  PLT 136* 139* 138* 161 156   Cardiac Enzymes: No results found for this basename: CKTOTAL:5,CKMB:5,CKMBINDEX:5,TROPONINI:5 in the last 168 hours BNP: BNP (last 3 results) No results found for this basename: PROBNP:3 in the last 8760 hours CBG:  Lab 01/17/12 2149  GLUCAP 277*     Patient examined this AM, and clinical data reviewed. No new issues. Stable for discharge as planned.   Signed:  Herb Beltre,CHRISTOPHER  [facility] 01/22/2012, 8:15 AM

## 2012-01-21 NOTE — Progress Notes (Signed)
Patient seen and examined.  Doing fair.   See d/c summary from 12/22 Probably can d/c to SNF in am once screened by MSW  Pleas Koch, MD [facility] [phone]

## 2012-01-21 NOTE — Progress Notes (Signed)
Agree with updated d/c plan   01/21/2012 Milana Kidney DPT PAGER: [phone] OFFICE: [phone]

## 2012-01-21 NOTE — Progress Notes (Signed)
Physical Therapy Treatment Patient Details Name: DIANE MOCHIZUKI MRN: [id number] DOB: 04/20/1931 Today's Date: 01/21/2012 Time: 1308-6578 PT Time Calculation (min): 19 min  PT Assessment / Plan / Recommendation Comments on Treatment Session  Pt with c/o nausea this AM but was agreeable to proceed with PT session.   Gait distance limited by nausea.  Pt reports pain/discomfort controlled.   Pt also states that he & his children have discussed possible need for ST-SNF prior to d/c home if they are unable to arrange 24 hr (A).  If pt does go directly home he will need to practice steps before d/cing home.      Follow Up Recommendations  Home health PT;SNF (if 24 hr (A) unavailable)     Does the patient have the potential to tolerate intense rehabilitation     Barriers to Discharge        Equipment Recommendations  Rolling walker with 5" wheels    Recommendations for Other Services    Frequency Min 5X/week   Plan Discharge plan remains appropriate;Frequency remains appropriate    Precautions / Restrictions Precautions Precautions: Fall Restrictions Weight Bearing Restrictions: Yes LLE Weight Bearing: Weight bearing as tolerated   Pertinent Vitals/Pain No pain reported.     Mobility  Bed Mobility Bed Mobility: Supine to Sit;Sitting - Scoot to Edge of Bed Supine to Sit: 4: Min assist Details for Bed Mobility Assistance: Cues for technique & use of UE's to increase ease of transitioning.  (A) for LLE.   Transfers Transfers: Sit to Stand;Stand to Sit Sit to Stand: 4: Min guard;With upper extremity assist;From bed Stand to Sit: 4: Min guard;With upper extremity assist;With armrests;To chair/3-in-1 Details for Transfer Assistance: Pt demonstrated safe technique.   Ambulation/Gait Ambulation/Gait Assistance: 4: Min guard Ambulation Distance (Feet): 5 Feet Assistive device: Rolling walker Ambulation/Gait Assistance Details: Cues for sequencing, Increased floor clearance, &  technique.  Distance limited due to pt c/o nausea & requesting to terminate gait training.    Gait Pattern: Step-to pattern;Decreased hip/knee flexion - left;Decreased step length - right Gait velocity: Decreased Stairs: No Wheelchair Mobility Wheelchair Mobility: No    Exercises Total Joint Exercises Ankle Circles/Pumps: AROM;Both;10 reps Quad Sets: AROM;Both;10 reps Heel Slides: AAROM;Left;10 reps Hip ABduction/ADduction: AAROM;Left;10 reps Marching in Standing: AAROM;Left;10 reps;Seated      PT Goals Acute Rehab PT Goals Time For Goal Achievement: 01/26/12 Potential to Achieve Goals: Good Pt will go Supine/Side to Sit: with modified independence;with HOB 0 degrees PT Goal: Supine/Side to Sit - Progress: Progressing toward goal Pt will go Sit to Supine/Side: with modified independence;with HOB 0 degrees Pt will go Sit to Stand: with modified independence PT Goal: Sit to Stand - Progress: Progressing toward goal Pt will go Stand to Sit: with modified independence PT Goal: Stand to Sit - Progress: Progressing toward goal Pt will Ambulate: >150 feet;with modified independence;with least restrictive assistive device PT Goal: Ambulate - Progress: Not met Pt will Go Up / Down Stairs: 1-2 stairs;with min assist;with least restrictive assistive device  Visit Information  Last PT Received On: 01/21/12 Assistance Needed: +1    Subjective Data  Subjective: "I feel nauseaus"   Cognition  Overall Cognitive Status: Appears within functional limits for tasks assessed/performed Arousal/Alertness: Awake/alert Orientation Level: Appears intact for tasks assessed Behavior During Session: [facility] for tasks performed    Balance     End of Session PT - End of Session Equipment Utilized During Treatment: Gait belt Activity Tolerance: Other (comment) (limited by nausea) Patient left: in  chair;with call bell/phone within reach     [place], [place] [phone] 01/21/2012

## 2012-01-21 NOTE — Progress Notes (Signed)
Patient ID: Joseph [name], male   DOB: [birth year], 78 y.o.   MRN: [id number] Subjective: 3 Days Post-Op Procedure(s) (LRB): INTRAMEDULLARY (IM) NAIL FEMORAL (Left)    Patient reports pain as mild.  Primary complaints this am is nausea.  Reports flatus but no BM yet  Objective:   VITALS:   Filed Vitals:   01/21/12 0728  BP: 145/68  Pulse: 87  Temp: 98.1 F (36.7 C)  Resp: 16    Incision: dressing C/D/I  LABS  Basename 01/21/12 0555 01/20/12 0440 01/19/12 0610  HGB 9.2* 9.8* 11.4*  HCT 27.1* 29.1* 34.5*  WBC 6.7 7.2 8.0  PLT 136* 139* 138*     Basename 01/20/12 0440 01/19/12 0610  NA 137 136  K 3.4* 4.5  BUN 15 11  CREATININE 1.03 0.95  GLUCOSE 136* 144*    No results found for this basename: LABPT:2,INR:2 in the last 72 hours   Assessment/Plan: 3 Days Post-Op Procedure(s) (LRB): INTRAMEDULLARY (IM) NAIL FEMORAL (Left)   Up with therapy Discharge home with home health probably tomorrow per medicine Will work on nausea today, including bowels

## 2012-01-21 NOTE — Progress Notes (Signed)
Occupational Therapy Treatment Patient Details Name: BECKETT MADEN MRN: [id number] DOB: 1931/03/17 Today's Date: 01/21/2012 Time: 7829-5621 OT Time Calculation (min): 29 min  OT Assessment / Plan / Recommendation Comments on Treatment Session Pt states that he has difficulty with nausea.when up Pt not orthostatic during session. Educated pt in increasing activity while OOB.. Pt ambulated @ 30 ft, however, pt is limited due to decreased endurance . Feel safest option for pt is to D/C to SNF for short rehab stay prior to returning home, as family can not provide 24/7 S. Pt/family in agreement. Pt is interested in [employer].     Follow Up Recommendations  SNF    Barriers to Discharge       Equipment Recommendations  3 in 1 bedside comode;Tub/shower seat    Recommendations for Other Services    Frequency Min 2X/week   Plan Discharge plan needs to be updated    Precautions / Restrictions Precautions Precautions: Fall Restrictions LLE Weight Bearing: Weight bearing as tolerated   Pertinent Vitals/Pain No c/o pain.    ADL  ADL Comments: orthostatics taken: supine 130/74. Sitting 157/79 (after rolling arms and tapping feet). standing 138/74. Asymptomatic. Pt states he feels very weak.     OT Diagnosis:    OT Problem List:   OT Treatment Interventions:     OT Goals Acute Rehab OT Goals OT Goal Formulation: With patient Time For Goal Achievement: 01/26/12 Potential to Achieve Goals: Good ADL Goals Pt Will Perform Grooming: with supervision;Standing at sink ADL Goal: Grooming - Progress: Progressing toward goals Pt Will Perform Lower Body Bathing: with supervision;Sit to stand from chair;Sit to stand from bed;with adaptive equipment ADL Goal: Lower Body Bathing - Progress: Progressing toward goals Pt Will Perform Lower Body Dressing: with supervision;Sit to stand from chair;Sit to stand from bed;with adaptive equipment ADL Goal: Lower Body Dressing - Progress: Progressing  toward goals Pt Will Transfer to Toilet: with supervision;Ambulation;with DME;Comfort height toilet ADL Goal: Toilet Transfer - Progress: Progressing toward goals Pt Will Perform Toileting - Clothing Manipulation: with supervision;Standing ADL Goal: Toileting - Clothing Manipulation - Progress: Progressing toward goals Pt Will Perform Toileting - Hygiene: with supervision;Sit to stand from 3-in-1/toilet ADL Goal: Toileting - Hygiene - Progress: Progressing toward goals Pt Will Perform Tub/Shower Transfer: Shower transfer;with supervision;Ambulation;with DME;Shower seat with back  Visit Information  Last OT Received On: 01/21/12 Assistance Needed: +1    Subjective Data      Prior Functioning  Home Living Lives With: Alone Available Help at Discharge: Family;Available 24 hours/day Type of Home: House Home Access: Stairs to enter [employer] of Steps: 1 Entrance Stairs-Rails: None Home Layout: One level Bathroom Shower/Tub: Health visitor: Handicapped height Bathroom Accessibility: Yes How Accessible: Accessible via walker Home Adaptive Equipment: None Additional Comments: Family present and reporting they will provide 24/7 assist if needed.  Prior Function Level of Independence: Independent Driving: Yes Vocation: Full time employment Comments: landscaper Communication Communication: No difficulties    Cognition  Overall Cognitive Status: Appears within functional limits for tasks assessed/performed Arousal/Alertness: Awake/alert Orientation Level: Appears intact for tasks assessed Behavior During Session: [facility] for tasks performed    Mobility  Shoulder Instructions Bed Mobility Bed Mobility: Supine to Sit;Sitting - Scoot to Edge of Bed Supine to Sit: 5: Supervision;HOB flat Sitting - Scoot to Edge of Bed: 5: Supervision Details for Bed Mobility Assistance: encouragement to move LLE independently Transfers Transfers: Sit to Stand;Stand to  Sit Sit to Stand: 4: Min guard;With upper extremity  assist;From bed Stand to Sit: 4: Min guard;With upper extremity assist;To chair/3-in-1 Details for Transfer Assistance: Pt demonstrated safe technique.         Exercises  Total Joint Exercises Ankle Circles/Pumps: AROM;Both;10 reps   Balance  minguard   End of Session OT - End of Session Equipment Utilized During Treatment: Gait belt Activity Tolerance: Patient tolerated treatment well Patient left: in chair;with call bell/phone within reach;with family/visitor present Nurse Communication: Other (comment);Mobility status (D/C plans)  GO     Sanora Cunanan,HILLARY 01/21/2012, 3:13 PMHilary Kamaljit Hizer, OTR/L  [phone] 01/21/2012

## 2012-01-22 ENCOUNTER — Encounter (HOSPITAL_COMMUNITY): Payer: Self-pay | Admitting: Orthopedic Surgery

## 2012-01-22 DIAGNOSIS — W19XXXA Unspecified fall, initial encounter: Secondary | ICD-10-CM

## 2012-01-22 MED ORDER — TRAMADOL HCL 50 MG PO TABS: 50.0000 mg | ORAL_TABLET | Freq: Four times a day (QID) | ORAL | Status: DC | PRN

## 2012-01-22 MED ORDER — ENOXAPARIN SODIUM 40 MG/0.4ML ~~LOC~~ SOLN: 40.0000 mg | SUBCUTANEOUS | Status: DC

## 2012-01-22 MED ORDER — SORBITOL 70 % SOLN: 30.0000 mL | Freq: Every day | Status: DC | PRN

## 2012-01-22 MED ORDER — METOCLOPRAMIDE HCL 5 MG PO TABS: 5.0000 mg | ORAL_TABLET | Freq: Three times a day (TID) | ORAL | Status: DC | PRN

## 2012-01-22 NOTE — Progress Notes (Signed)
[facility] PROGRESS NOTE  Joseph [name] ZOX:[id number] DOB: [birth year] DOA: 01/17/2012 PCP: Thora Lance, MD  Assessment/Plan: Principal Problem:  *Femur fracture, left Active Problems:  HYPERTENSION  ESOPHAGEAL MOTILITY DISORDER  GERD  Fall at home  Hip fracture, left   1. Left subtrochanteric femur fracture, s/p L IM nailing 12/19:   Patient present with a comminuted left subtrochanteric femoral fracture, following a mechanical fall. Dr Ollen Gross provided orthopedic consultation, and patient is s/p IM nail on 01/18/2012. In stable condition. Pain is controlled- PT/OT. Per orthopedics, WBAT. PT/OT has recommended short-term SNF. .  2. Hypertension Stable/Conttrolled. Continue Toprol XL 100 daily. Orthostatics were negative.  3. gastroesophageal reflux disease Stable/Asym[ptomatic on PPI.    Code Status: Full Code.  Family Communication:  Disposition Plan: Discharged to SNF today. Patient had some nausea/lightheadedness following surgery, which were deemed multifactorial and related to opiates and constipation. He has responded satisfactorily to Reglan, limitation of narcotics and a bowel regimen.     Brief narrative: 78 yo male with history of HTN and achalasia, s/p surgical repair, was changing the battery of a smoke detector on a step stool, on 01/17/12, when he lost his balance and fell off. Denies LOC, but did hurt his left hip. Imaging studies revealed a  comminuted proximal left diaphysis/intertrochanteric femur fracture. He was admitted for further management.     Consultants:  Dr Ollen Gross, orthopedic surgeon.   Procedures:  X-Ray Hips.  Intramedullary nail fixation 01/18/12.   Antibiotics:  N/A.   HPI/Subjective: No new issues today. No nausea, moved bowels.   Objective: Vital signs in last 24 hours: Temp:  [98 F (36.7 C)-98.7 F (37.1 C)] 98.5 F (36.9 C) (12/23 0611) Pulse Rate:  [81-102] 85  (12/23 0611) Resp:  [16-18] 18   (12/23 0611) BP: (126-157)/(65-86) 127/74 mmHg (12/23 0611) SpO2:  [98 %-100 %] 98 % (12/23 8119) Weight change:  Last BM Date: 01/21/12  Intake/Output from previous day: 12/22 0701 - 12/23 0700 In: 360 [P.O.:360] Out: 700 [Urine:700]     Physical Exam: General: Comfortable, not in acute pain, alert, communicative, fully oriented, not short of breath at rest.  HEENT:  Mild clinical pallor, no jaundice, no conjunctival injection or discharge. NECK:  Supple, JVP not seen, no carotid bruits, no palpable lymphadenopathy, no palpable goiter. CHEST:  Clinically clear to auscultation, no wheezes, no crackles. HEART:  Sounds 1 and 2 heard, normal, regular, no murmurs. ABDOMEN:  Full, soft, non-tender, no palpable organomegaly, no palpable masses, normal bowel sounds. GENITALIA:  Not examined. LOWER EXTREMITIES:  No pitting edema, palpable peripheral pulses. Surgical site, unremarkable.  MUSCULOSKELETAL SYSTEM:  Generalized osteoarthritic changes, otherwise, normal. CENTRAL NERVOUS SYSTEM:  No focal neurologic deficit on gross examination.  Lab Results:  [facility] 01/21/12 0555 01/20/12 0440  WBC 6.7 7.2  HGB 9.2* 9.8*  HCT 27.1* 29.1*  PLT 136* 139*    Basename 01/20/12 0440  NA 137  K 3.4*  CL 105  CO2 24  GLUCOSE 136*  BUN 15  CREATININE 1.03  CALCIUM 8.1*   Recent Results (from the past 240 hour(s))  SURGICAL PCR SCREEN     Status: Normal   Collection Time   01/18/12  7:12 PM      Component Value Range Status Comment   MRSA, PCR NEGATIVE  NEGATIVE Final    Staphylococcus aureus NEGATIVE  NEGATIVE Final      Studies/Results: No results found.  Medications: Scheduled Meds:   . enoxaparin (LOVENOX) injection  40  mg Subcutaneous Q24H  . metoprolol succinate  100 mg Oral Daily  . omeprazole  20 mg Oral Daily   Continuous Infusions:  PRN Meds:.acetaminophen, acetaminophen, bisacodyl, menthol-cetylpyridinium, metoCLOPramide, ondansetron, ondansetron (ZOFRAN) IV,  ondansetron, oxyCODONE, phenol, polyethylene glycol, sorbitol, traMADol    LOS: 5 days   Samual Beals,CHRISTOPHER  [facility] Pager [phone]. If 8PM-8AM, please contact night-coverage at [email or web address], password [facility] 01/22/2012, 7:48 AM  LOS: 5 days

## 2012-01-22 NOTE — Progress Notes (Signed)
Utilization review completed. Othon Guardia, RN, BSN. 

## 2012-01-22 NOTE — Progress Notes (Signed)
Clinical social worker assisted with patient discharge to skilled nursing facility, [facility] .Patient transportation provided by [employer] [facility] with patient chart copy. .No further Clinical Social Work needs, signing off.  Sabino Niemann, MSW, [employer] [phone]

## 2012-01-22 NOTE — Progress Notes (Signed)
CARE MANAGEMENT NOTE 01/22/2012  Patient:  Joseph [name], Joseph [name]   Account Number:  [id number]  Date Initiated:  01/19/2012  Documentation initiated by:  Donn Pierini  Subjective/Objective Assessment:   Pt admitted with hip fx- s/p repair     Action/Plan:   PTA pt lived at home alone- PT/OT evals-   Anticipated DC Date:  01/22/2012   Anticipated DC Plan:  SKILLED NURSING FACILITY  In-house referral  Clinical Social Worker      DC Associate Professor  CM consult      [facility] Choice  HOME HEALTH  DURABLE MEDICAL EQUIPMENT   Choice offered to / List presented to:  [name]   DME arranged  3-N-1  TUB BENCH  WALKER - ROLLING        HH arranged  HH-2 PT      Status of service:  Completed, signed off Medicare Important Message given?   (If response is "NO", the following Medicare IM given date fields will be blank) Date Medicare IM given:   Date Additional Medicare IM given:    Discharge Disposition:  SKILLED NURSING FACILITY  Per UR Regulation:  Reviewed for med. necessity/level of care/duration of stay  If discussed at Long Length of Stay Meetings, dates discussed:    Comments:  01/22/12 10:10 Vance Peper, RN BSN Patient and son have decided that he will go to SNF for shortterm rehab. Social worker is aware.   12/

## 2012-01-22 NOTE — Progress Notes (Signed)
Clinical Social Work Department  CLINICAL SOCIAL WORK PLACEMENT NOTE  01/22/2012  Patient: Joseph [name] Account Number: [id number]  Admit date: 01/17/12  Clinical Social Worker: Sabino Niemann MSW Date/time: 01/07/2012 11:30 AM  Clinical Social Work is seeking post-discharge placement for this patient at the following level of care: SKILLED NURSING (*CSW will update this form in Epic as items are completed)  01/22/12 Patient/family provided with [facility] Department of Clinical Social Work's list of facilities offering this level of care within the geographic area requested by the patient (or if unable, by the patient's family).  12/23/13Patient/family informed of their freedom to choose among providers that offer the needed level of care, that participate in Medicare, Medicaid or managed care program needed by the patient, have an available bed and are willing to accept the patient.  01/22/12 Patient/family informed of [facility]' ownership interest in [facility], as well as of the fact that they are under no obligation to receive care at this facility.  PASARR submitted to EDS on 01/22/12  PASARR number received from EDS on 01/22/12  FL2 transmitted to all facilities in geographic area requested by pt/family on 01/22/12  FL2 transmitted to all facilities within larger geographic area on  Patient informed that his/her managed care company has contracts with or will negotiate with certain facilities, including the following:  Patient/family informed of bed offers received: 01/22/12  Patient chooses bed at [facility] Physician recommends and patient chooses bed at  Patient to be transferred to on 01/22/12  Patient to be transferred to facility by [facility]  The following physician request were entered in Epic:  Additional Comments:  Sabino Niemann, MSW  [phone]

## 2012-01-22 NOTE — Clinical Social Work Psychosocial (Signed)
Clinical Social Work Department  BRIEF PSYCHOSOCIAL ASSESSMENT  Patient: Joseph [name]  Account Number: [id number]  Admit date: 01/17/12 Clinical Social Worker Date/Time:  Referred by: Physician Date Referred: 01/22/12 Referred for   SNF Placement   Other Referral:  Interview type: Patient  Other interview type: PSYCHOSOCIAL DATA  Living Status: Wife Admitted from facility:  Level of care:  Primary support name: Sajan [place]wood Primary support relationship to patient: Son Degree of support available:  Strong and vested  CURRENT CONCERNS  Current Concerns   Post-Acute Placement   Other Concerns:  SOCIAL WORK ASSESSMENT / PLAN  CSW met with pt re: PT recommendation for SNF.   Pt lives with his wife  CSW explained placement process and answered questions.   [facility]    CSW completed FL2 and initiated [facility] search.   Weekday CSW to f/u with offers.   Assessment/plan status: Information/Referral to [facility]  Other assessment/ plan:  Information/referral to community resources:  SNF   PTAR   PATIENT'S/FAMILY'S RESPONSE TO PLAN OF CARE:  Pt  And pt's son report they are agreeable to ST SNF in order to increase strength and independence with mobility prior to returning home. Pt verbalized understanding of placement process and appreciation for CSW assist.   Sabino Niemann, MSW [phone]

## 2012-01-22 NOTE — Progress Notes (Signed)
Physical Therapy Treatment Patient Details Name: Joseph [name] MRN: [id number] DOB: 1931-04-23 Today's Date: 01/22/2012 Time: 5621-3086 PT Time Calculation (min): 25 min  PT Assessment / Plan / Recommendation Comments on Treatment Session  Patient feeling much better today and eager to ambulation. Progressing well with long ambulation. Patient has chosen a bed at [facility] as he is unable to coordinate 24 hour care at home.     Follow Up Recommendations  SNF     Does the patient have the potential to tolerate intense rehabilitation     Barriers to Discharge        Equipment Recommendations  Rolling walker with 5" wheels    Recommendations for Other Services    Frequency Min 5X/week   Plan Discharge plan remains appropriate;Frequency remains appropriate    Precautions / Restrictions Restrictions LLE Weight Bearing: Weight bearing as tolerated   Pertinent Vitals/Pain     Mobility  Bed Mobility Sitting - Scoot to Edge of Bed: 4: Min assist Details for Bed Mobility Assistance: A with LEs Transfers Sit to Stand: 4: Min guard;With upper extremity assist;From chair/3-in-1 Stand to Sit: 4: Min guard;With upper extremity assist;To bed Details for Transfer Assistance: MinGuard for safety. Safe hand placement. Cues for positioning at EOB prior to lying Ambulation/Gait Ambulation/Gait Assistance: 4: Min guard Ambulation Distance (Feet): 150 Feet Assistive device: Rolling walker Ambulation/Gait Assistance Details: Cues for posture. Patient able to increase ambulation distance this session.  Gait Pattern: Step-to pattern;Decreased step length - right    Exercises General Exercises - Lower Extremity Heel Slides: AAROM;Left;10 reps Hip ABduction/ADduction: AAROM;Left;10 reps   PT Diagnosis:    PT Problem List:   PT Treatment Interventions:     PT Goals Acute Rehab PT Goals PT Goal: Sit to Supine/Side - Progress: Progressing toward goal PT Goal: Sit to Stand - Progress:  Progressing toward goal PT Goal: Stand to Sit - Progress: Progressing toward goal PT Goal: Ambulate - Progress: Progressing toward goal  Visit Information  Last PT Received On: 01/22/12 Assistance Needed: +1    Subjective Data      Cognition  Overall Cognitive Status: Appears within functional limits for tasks assessed/performed Arousal/Alertness: Awake/alert Orientation Level: Appears intact for tasks assessed Behavior During Session: [facility] for tasks performed    Balance     End of Session PT - End of Session Equipment Utilized During Treatment: Gait belt Activity Tolerance: Patient tolerated treatment well Patient left: in bed;with call bell/phone within reach Nurse Communication: Mobility status   GP     Fredrich Birks 01/22/2012, 10:45 AM 01/22/2012 Fredrich Birks PTA [phone] pager [phone] office

## 2012-01-22 NOTE — Progress Notes (Signed)
   Subjective: 4 Days Post-Op Procedure(s) (LRB): INTRAMEDULLARY (IM) NAIL FEMORAL (Left) Patient reports pain as mild.  He has been up walking int he hallways. Patient seen in rounds for Dr. Lequita Halt. Patient is well, and has had no acute complaints or problems Plan is to go Skilled nursing facility after hospital stay.  Patient states he is going to [facility].  Objective: Vital signs in last 24 hours: Temp:  [98 F (36.7 C)-98.7 F (37.1 C)] 98.5 F (36.9 C) (12/23 0611) Pulse Rate:  [81-102] 85  (12/23 0611) Resp:  [16-18] 18  (12/23 0611) BP: (126-157)/(65-86) 127/74 mmHg (12/23 0611) SpO2:  [95 %-100 %] 98 % (12/23 0611)  Intake/Output from previous day:  Intake/Output Summary (Last 24 hours) at 01/22/12 0649 Last data filed at 01/22/12 0500  Gross per 24 hour  Intake    360 ml  Output    700 ml  Net   -340 ml    Intake/Output this shift: Total I/O In: 360 [P.O.:360] Out: 300 [Urine:300]  Labs:  [facility] 01/21/12 0555 01/20/12 0440  HGB 9.2* 9.8*    Basename 01/21/12 0555 01/20/12 0440  WBC 6.7 7.2  RBC 3.09* 3.35*  HCT 27.1* 29.1*  PLT 136* 139*    Basename 01/20/12 0440  NA 137  K 3.4*  CL 105  CO2 24  BUN 15  CREATININE 1.03  GLUCOSE 136*  CALCIUM 8.1*   No results found for this basename: LABPT:2,INR:2 in the last 72 hours  EXAM General - Patient is Alert, Appropriate and Oriented Extremity - Neurovascular intact Sensation intact distally Dorsiflexion/Plantar flexion intact No cellulitis present Dressing/Incision - clean, dry, no drainage, healing to the three incisions Motor Function - intact, moving foot and toes well on exam.   Past Medical History  Diagnosis Date  . Hypertension     Assessment/Plan: 4 Days Post-Op Procedure(s) (LRB): INTRAMEDULLARY (IM) NAIL FEMORAL (Left) Principal Problem:  *Femur fracture, left Active Problems:  HYPERTENSION  ESOPHAGEAL MOTILITY DISORDER  GERD  Fall at home  Hip fracture,  left  Estimated Body mass index is 27.27 kg/(m^2) as calculated from the following:   Height as of this encounter: 6\' 2" (1.88 m).   Weight as of this encounter: 212 lb 6.4 oz(96.344 kg). Discharge to SNF  DVT Prophylaxis - Lovenox for 5 more days, then switch over to a 325 mg Aspirin daily for four more weeks. Weight Bearing As Tolerated right Leg Follow up on Jan 2nd.  Patrica Duel 01/22/2012, 6:49 AM

## 2012-05-23 ENCOUNTER — Encounter: Payer: Self-pay | Admitting: Internal Medicine

## 2012-07-02 ENCOUNTER — Encounter: Payer: Self-pay | Admitting: Internal Medicine

## 2012-07-02 ENCOUNTER — Ambulatory Visit (AMBULATORY_SURGERY_CENTER): Payer: Medicare Other | Admitting: *Deleted

## 2012-07-02 VITALS — Ht 72.0 in | Wt 183.6 lb

## 2012-07-02 DIAGNOSIS — K5730 Diverticulosis of large intestine without perforation or abscess without bleeding: Secondary | ICD-10-CM

## 2012-07-02 DIAGNOSIS — Z86010 Personal history of colonic polyps: Secondary | ICD-10-CM

## 2012-07-02 DIAGNOSIS — Z1211 Encounter for screening for malignant neoplasm of colon: Secondary | ICD-10-CM

## 2012-07-02 MED ORDER — MOVIPREP 100 G PO SOLR: 1.0000 | Freq: Once | ORAL | Status: DC

## 2012-07-02 NOTE — Progress Notes (Signed)
Denies allergies to eggs or soy products. Denies complications with anesthesia or sedation. 

## 2012-07-16 ENCOUNTER — Ambulatory Visit (AMBULATORY_SURGERY_CENTER): Payer: Medicare Other | Admitting: Internal Medicine

## 2012-07-16 ENCOUNTER — Encounter: Payer: Self-pay | Admitting: Internal Medicine

## 2012-07-16 VITALS — BP 136/77 | HR 70 | Temp 98.0°F | Resp 21 | Ht 72.0 in | Wt 183.0 lb

## 2012-07-16 DIAGNOSIS — K5732 Diverticulitis of large intestine without perforation or abscess without bleeding: Secondary | ICD-10-CM

## 2012-07-16 DIAGNOSIS — Z1211 Encounter for screening for malignant neoplasm of colon: Secondary | ICD-10-CM

## 2012-07-16 DIAGNOSIS — D126 Benign neoplasm of colon, unspecified: Secondary | ICD-10-CM

## 2012-07-16 DIAGNOSIS — K5730 Diverticulosis of large intestine without perforation or abscess without bleeding: Secondary | ICD-10-CM

## 2012-07-16 DIAGNOSIS — Z86010 Personal history of colonic polyps: Secondary | ICD-10-CM

## 2012-07-16 MED ORDER — SODIUM CHLORIDE 0.9 % IV SOLN: 500.0000 mL | INTRAVENOUS | Status: DC

## 2012-07-16 NOTE — Patient Instructions (Signed)
YOU HAD AN ENDOSCOPIC PROCEDURE TODAY AT THE [facility]: Refer to the procedure report that was given to you for any specific questions about what was found during the examination.  If the procedure report does not answer your questions, please call your gastroenterologist to clarify.  If you requested that your care partner not be given the details of your procedure findings, then the procedure report has been included in a sealed envelope for you to review at your convenience later.  YOU SHOULD EXPECT: Some feelings of bloating in the abdomen. Passage of more gas than usual.  Walking can help get rid of the air that was put into your GI tract during the procedure and reduce the bloating. If you had a lower endoscopy (such as a colonoscopy or flexible sigmoidoscopy) you may notice spotting of blood in your stool or on the toilet paper. If you underwent a bowel prep for your procedure, then you may not have a normal bowel movement for a few days.  DIET: Your first meal following the procedure should be a light meal and then it is ok to progress to your normal diet.  A half-sandwich or bowl of soup is an example of a good first meal.  Heavy or fried foods are harder to digest and may make you feel nauseous or bloated.  Likewise meals heavy in dairy and vegetables can cause extra gas to form and this can also increase the bloating.  Drink plenty of fluids but you should avoid alcoholic beverages for 24 hours.  ACTIVITY: Your care partner should take you home directly after the procedure.  You should plan to take it easy, moving slowly for the rest of the day.  You can resume normal activity the day after the procedure however you should NOT DRIVE or use heavy machinery for 24 hours (because of the sedation medicines used during the test).    SYMPTOMS TO REPORT IMMEDIATELY: A gastroenterologist can be reached at any hour.  During normal business hours, 8:30 AM to 5:00 PM Monday through Friday,  call [phone].  After hours and on weekends, please call the GI answering service at [phone] who will take a message and have the physician on call contact you.   Following lower endoscopy (colonoscopy or flexible sigmoidoscopy):  Excessive amounts of blood in the stool  Significant tenderness or worsening of abdominal pains  Swelling of the abdomen that is new, acute  Fever of 100F or higher    FOLLOW UP: If any biopsies were taken you will be contacted by phone or by letter within the next 1-3 weeks.  Call your gastroenterologist if you have not heard about the biopsies in 3 weeks.  Our staff will call the home number listed on your records the next business day following your procedure to check on you and address any questions or concerns that you may have at that time regarding the information given to you following your procedure. This is a courtesy call and so if there is no answer at the home number and we have not heard from you through the emergency physician on call, we will assume that you have returned to your regular daily activities without incident.  SIGNATURES/CONFIDENTIALITY: You and/or your care partner have signed paperwork which will be entered into your electronic medical record.  These signatures attest to the fact that that the information above on your After Visit Summary has been reviewed and is understood.  Full responsibility of the confidentiality   of this discharge information lies with you and/or your care-partner.     INFORMATION ON POLYPS, DIVERTICULOSIS, & HIGH FIBER DIET GIVEN TO YOU TODAY 

## 2012-07-16 NOTE — Progress Notes (Signed)
Patient did not experience any of the following events: a burn prior to discharge; a fall within the facility; wrong site/side/patient/procedure/implant event; or a hospital transfer or hospital admission upon discharge from the facility. (G8907) Patient did not have preoperative order for IV antibiotic SSI prophylaxis. (G8918)  

## 2012-07-16 NOTE — Progress Notes (Signed)
Called to room to assist during endoscopic procedure.  Patient ID and intended procedure confirmed with present staff. Received instructions for my participation in the procedure from the performing physician.  

## 2012-07-16 NOTE — Op Note (Signed)
 [facility] [address] [place], [address]   COLONOSCOPY PROCEDURE REPORT  PATIENT: Joseph [name], Joseph [name]  MR#: [id number] BIRTHDATE: Jun 27, 1931 , 81  yrs. old GENDER: Male ENDOSCOPIST: Roxy Cedar, MD REFERRED [id number] Program Recall PROCEDURE DATE:  07/16/2012 PROCEDURE:   Colonoscopy with snare polypectomy  x 1 ASA CLASS:   Class II INDICATIONS:Patient's personal history of adenomatous colon polyps.  MEDICATIONS: MAC sedation, administered by CRNA and propofol (Diprivan) 300mg  IV  DESCRIPTION OF PROCEDURE:   After the risks benefits and alternatives of the procedure were thoroughly explained, informed consent was obtained.  A digital rectal exam revealed no abnormalities of the rectum.   The LB [id number]  endoscope was introduced through the anus and advanced to the cecum, which was identified by both the appendix and ileocecal valve. No adverse events experienced.   The quality of the prep was adequate, using MoviPrep  The instrument was then slowly withdrawn as the colon was fully examined.      COLON FINDINGS: A pedunculated polyp measuring 6 mm in size was found in the sigmoid colon.  A polypectomy was performed with a cold snare.  The resection was complete and the polyp tissue was completely retrieved.   Severe diverticulosis was noted throughout the entire examined colon.   The colon mucosa was otherwise normal. Retroflexed views revealed internal hemorrhoids. The time to cecum=4 minutes 0 seconds.  Withdrawal time=8 minutes 36 seconds. The scope was withdrawn and the procedure completed. COMPLICATIONS: There were no complications.  ENDOSCOPIC IMPRESSION: 1.   Pedunculated polyp measuring 6 mm in size was found in the sigmoid colon; polypectomy was performed with a cold snare 2.   Severe diverticulosis was noted throughout the entire examined colon 3.   The colon mucosa was otherwise normal  RECOMMENDATIONS: 1. Return to  the care of your primary provider.  GI follow up as needed   eSigned:  Roxy Cedar, MD 07/16/2012 10:43 AM   cc: Blair Heys, MD and The Patient   PATIENT NAME:  Joseph [name], Joseph [name] MR#: [id number]

## 2012-07-17 ENCOUNTER — Telehealth: Payer: Self-pay

## 2012-07-17 NOTE — Telephone Encounter (Signed)
  Follow up Call-  Call back number 07/16/2012  Post procedure Call Back phone  # [phone]  Permission to leave phone message Yes     Patient questions:  Do you have a fever, pain , or abdominal swelling? no Pain Score  0 *  Have you tolerated food without any problems? yes  Have you been able to return to your normal activities? yes  Do you have any questions about your discharge instructions: Diet   no Medications  no Follow up visit  no  Do you have questions or concerns about your Care? no  Actions: * If pain score is 4 or above: No action needed, pain <4.

## 2012-07-22 ENCOUNTER — Encounter: Payer: Self-pay | Admitting: Internal Medicine

## 2012-12-02 ENCOUNTER — Ambulatory Visit
Admission: RE | Admit: 2012-12-02 | Discharge: 2012-12-02 | Disposition: A | Discharge status: Home / Self Care | Payer: Medicare Other | Source: Ambulatory Visit | Attending: Orthopedic Surgery | Admitting: Orthopedic Surgery

## 2012-12-02 ENCOUNTER — Other Ambulatory Visit: Payer: Self-pay | Admitting: Orthopedic Surgery

## 2012-12-02 DIAGNOSIS — S72009Q Fracture of unspecified part of neck of unspecified femur, subsequent encounter for open fracture type I or II with malunion: Secondary | ICD-10-CM

## 2012-12-03 ENCOUNTER — Other Ambulatory Visit: Payer: Self-pay | Admitting: Orthopedic Surgery

## 2012-12-03 DIAGNOSIS — S72302D Unspecified fracture of shaft of left femur, subsequent encounter for closed fracture with routine healing: Secondary | ICD-10-CM

## 2013-01-03 ENCOUNTER — Other Ambulatory Visit: Payer: Self-pay | Admitting: Orthopedic Surgery

## 2013-01-07 ENCOUNTER — Encounter (HOSPITAL_COMMUNITY): Payer: Self-pay | Admitting: Pharmacy Technician

## 2013-01-08 ENCOUNTER — Encounter (HOSPITAL_COMMUNITY)
Admission: RE | Admit: 2013-01-08 | Discharge: 2013-01-08 | Disposition: A | Discharge status: Home / Self Care | Payer: Medicare Other | Source: Ambulatory Visit | Attending: Orthopedic Surgery | Admitting: Orthopedic Surgery

## 2013-01-08 ENCOUNTER — Encounter (HOSPITAL_COMMUNITY): Payer: Self-pay

## 2013-01-08 ENCOUNTER — Ambulatory Visit (HOSPITAL_COMMUNITY)
Admission: RE | Admit: 2013-01-08 | Discharge: 2013-01-08 | Disposition: A | Discharge status: Home / Self Care | Payer: Medicare Other | Source: Ambulatory Visit | Attending: Orthopedic Surgery | Admitting: Orthopedic Surgery

## 2013-01-08 DIAGNOSIS — Z01818 Encounter for other preprocedural examination: Secondary | ICD-10-CM | POA: Insufficient documentation

## 2013-01-08 DIAGNOSIS — Z01810 Encounter for preprocedural cardiovascular examination: Secondary | ICD-10-CM | POA: Insufficient documentation

## 2013-01-08 DIAGNOSIS — Z01812 Encounter for preprocedural laboratory examination: Secondary | ICD-10-CM | POA: Insufficient documentation

## 2013-01-08 DIAGNOSIS — I10 Essential (primary) hypertension: Secondary | ICD-10-CM | POA: Insufficient documentation

## 2013-01-08 DIAGNOSIS — I498 Other specified cardiac arrhythmias: Secondary | ICD-10-CM | POA: Insufficient documentation

## 2013-01-08 HISTORY — DX: Personal history of urinary calculi: Z87.442

## 2013-01-08 HISTORY — DX: Gastro-esophageal reflux disease without esophagitis: K21.9

## 2013-01-08 HISTORY — DX: Unspecified osteoarthritis, unspecified site: M19.90

## 2013-01-08 LAB — BASIC METABOLIC PANEL
BUN: 20 mg/dL (ref 6–23)
Calcium: 9.3 mg/dL (ref 8.4–10.5)
GFR calc Af Amer: 60 mL/min — ABNORMAL LOW (ref >90)
GFR calc non Af Amer: 52 mL/min — ABNORMAL LOW (ref >90)
Potassium: 4 mEq/L (ref 3.5–5.1)
Sodium: 136 mEq/L (ref 135–145)

## 2013-01-08 LAB — CBC
HCT: 43.3 % (ref 39.0–52.0)
Hemoglobin: 14.8 g/dL (ref 13.0–17.0)
MCH: 29.3 pg (ref 26.0–34.0)
MCHC: 34.2 g/dL (ref 30.0–36.0)
MCV: 85.7 fL (ref 78.0–100.0)
RDW: 13.2 % (ref 11.5–15.5)

## 2013-01-08 NOTE — Patient Instructions (Signed)
YOUR SURGERY IS SCHEDULED AT [facility]  ON:  Monday  12/15  REPORT TO  SHORT STAY CENTER AT:  1:05 PM      PHONE # FOR SHORT STAY IS [phone]  DO NOT EAT ANYTHING AFTER MIDNIGHT THE NIGHT BEFORE YOUR SURGERY.  YOU MAY BRUSH YOUR TEETH, RINSE OUT YOUR MOUTH--BUT NO WATER, NO FOOD, NO CHEWING GUM, NO MINTS, NO CANDIES, NO CHEWING TOBACCO. YOU MAY HAVE CLEAR LIQUIDS TO DRINK FROM MIDNIGHT UNTIL 10:00 AM THE DAY OF SURGERY - LIKE WATER, BLACK COFFEE ( NO MILK OR CREAM ), SODA.   NOTHING TO DRINK AFTER 10:00 AM DAY OF SURGERY.  PLEASE TAKE THE FOLLOWING MEDICATIONS THE AM OF YOUR SURGERY WITH A FEW SIPS OF WATER:   METOPROLOL, NEXIUM.  TAKE OXYCODONE IF NEEDED FOR PAIN.    DO NOT BRING VALUABLES, MONEY, CREDIT CARDS.  DO NOT WEAR JEWELRY, MAKE-UP, NAIL POLISH AND NO METAL PINS OR CLIPS IN YOUR HAIR. CONTACT LENS, DENTURES / PARTIALS, GLASSES SHOULD NOT BE WORN TO SURGERY AND IN MOST CASES-HEARING AIDS WILL NEED TO BE REMOVED.  BRING YOUR GLASSES CASE, ANY EQUIPMENT NEEDED FOR YOUR CONTACT LENS. FOR PATIENTS ADMITTED TO THE HOSPITAL--CHECK OUT TIME THE DAY OF DISCHARGE IS 11:00 AM.  ALL INPATIENT ROOMS ARE PRIVATE - WITH BATHROOM, TELEPHONE, TELEVISION AND WIFI INTERNET.  IF YOU ARE BEING DISCHARGED THE SAME DAY OF YOUR SURGERY--YOU CAN NOT DRIVE YOURSELF HOME--AND SHOULD NOT GO HOME ALONE BY TAXI OR BUS.  NO DRIVING OR OPERATING MACHINERY, DO NOT MAKE LEGAL DECISIONS FOR 24 HOURS FOLLOWING ANESTHESIA / PAIN MEDICATIONS.  PLEASE MAKE ARRANGEMENTS FOR SOMEONE TO BE WITH YOU AT HOME THE FIRST 24 HOURS AFTER SURGERY. RESPONSIBLE DRIVER'S NAME / PHONE                                                   PLEASE READ OVER ANY  FACT SHEETS THAT YOU WERE GIVEN: INCENTIVE SPIROMETER INSTRUCTIONS  FAILURE TO FOLLOW THESE INSTRUCTIONS MAY RESULT IN THE CANCELLATION OF YOUR SURGERY. PLEASE BE AWARE THAT YOU MAY NEED ADDITIONAL BLOOD DRAWN DAY OF YOUR SURGERY  PATIENT  SIGNATURE_________________________________

## 2013-01-08 NOTE — Pre-Procedure Instructions (Signed)
EKG AND CXR WERE DONE TODAY - PREOP - AS PER ANESTHESIOLOGIST'S GUIDELINES. 

## 2013-01-13 ENCOUNTER — Ambulatory Visit (HOSPITAL_COMMUNITY): Payer: Medicare Other | Admitting: Certified Registered"

## 2013-01-13 ENCOUNTER — Encounter (HOSPITAL_COMMUNITY): Payer: Self-pay | Admitting: *Deleted

## 2013-01-13 ENCOUNTER — Encounter (HOSPITAL_COMMUNITY): Payer: Medicare Other | Admitting: Certified Registered"

## 2013-01-13 ENCOUNTER — Encounter (HOSPITAL_COMMUNITY)
Admission: RE | Disposition: A | Discharge status: Home / Self Care | Payer: Self-pay | Source: Ambulatory Visit | Attending: Orthopedic Surgery

## 2013-01-13 ENCOUNTER — Ambulatory Visit (HOSPITAL_COMMUNITY): Payer: Medicare Other

## 2013-01-13 ENCOUNTER — Observation Stay (HOSPITAL_COMMUNITY)
Admission: RE | Admit: 2013-01-13 | Discharge: 2013-01-14 | Disposition: A | Discharge status: Home / Self Care | Payer: Medicare Other | Source: Ambulatory Visit | Attending: Orthopedic Surgery | Admitting: Orthopedic Surgery

## 2013-01-13 DIAGNOSIS — T8484XA Pain due to internal orthopedic prosthetic devices, implants and grafts, initial encounter: Secondary | ICD-10-CM

## 2013-01-13 DIAGNOSIS — Y831 Surgical operation with implant of artificial internal device as the cause of abnormal reaction of the patient, or of later complication, without mention of misadventure at the time of the procedure: Secondary | ICD-10-CM | POA: Insufficient documentation

## 2013-01-13 DIAGNOSIS — M25559 Pain in unspecified hip: Secondary | ICD-10-CM | POA: Insufficient documentation

## 2013-01-13 DIAGNOSIS — T8489XA Other specified complication of internal orthopedic prosthetic devices, implants and grafts, initial encounter: Principal | ICD-10-CM | POA: Insufficient documentation

## 2013-01-13 DIAGNOSIS — Z96659 Presence of unspecified artificial knee joint: Secondary | ICD-10-CM | POA: Insufficient documentation

## 2013-01-13 DIAGNOSIS — I10 Essential (primary) hypertension: Secondary | ICD-10-CM | POA: Insufficient documentation

## 2013-01-13 DIAGNOSIS — Z79899 Other long term (current) drug therapy: Secondary | ICD-10-CM | POA: Insufficient documentation

## 2013-01-13 DIAGNOSIS — K219 Gastro-esophageal reflux disease without esophagitis: Secondary | ICD-10-CM | POA: Insufficient documentation

## 2013-01-13 HISTORY — PX: HARDWARE REMOVAL: SHX979

## 2013-01-13 SURGERY — REMOVAL, HARDWARE
Anesthesia: General | Site: Hip | Laterality: Left

## 2013-01-13 MED ORDER — ROCURONIUM BROMIDE 100 MG/10ML IV SOLN
INTRAVENOUS | Status: AC
  Filled 2013-01-13: qty 1

## 2013-01-13 MED ORDER — MIDAZOLAM HCL 2 MG/2ML IJ SOLN
INTRAMUSCULAR | Status: AC
  Filled 2013-01-13: qty 2

## 2013-01-13 MED ORDER — DEXAMETHASONE SODIUM PHOSPHATE 10 MG/ML IJ SOLN: 10.0000 mg | Freq: Once | INTRAMUSCULAR | Status: DC

## 2013-01-13 MED ORDER — FENTANYL CITRATE 0.05 MG/ML IJ SOLN
INTRAMUSCULAR | Status: DC | PRN
  Administered 2013-01-13 (×2): 50 ug via INTRAVENOUS

## 2013-01-13 MED ORDER — FENTANYL CITRATE 0.05 MG/ML IJ SOLN
INTRAMUSCULAR | Status: AC
  Filled 2013-01-13: qty 5

## 2013-01-13 MED ORDER — CEFAZOLIN SODIUM-DEXTROSE 2-3 GM-% IV SOLR
INTRAVENOUS | Status: AC
  Filled 2013-01-13: qty 50

## 2013-01-13 MED ORDER — LACTATED RINGERS IV SOLN
INTRAVENOUS | Status: DC
  Administered 2013-01-13: 1000 mL via INTRAVENOUS

## 2013-01-13 MED ORDER — ONDANSETRON HCL 4 MG PO TABS: 4.0000 mg | ORAL_TABLET | Freq: Four times a day (QID) | ORAL | Status: DC | PRN

## 2013-01-13 MED ORDER — SODIUM CHLORIDE 0.9 % IV SOLN: INTRAVENOUS | Status: DC

## 2013-01-13 MED ORDER — PANTOPRAZOLE SODIUM 40 MG PO TBEC
80.0000 mg | DELAYED_RELEASE_TABLET | Freq: Every day | ORAL | Status: DC
  Administered 2013-01-14: 80 mg via ORAL
  Filled 2013-01-13: qty 2

## 2013-01-13 MED ORDER — ACETAMINOPHEN 10 MG/ML IV SOLN
INTRAVENOUS | Status: DC | PRN
  Administered 2013-01-13: 1000 mg via INTRAVENOUS

## 2013-01-13 MED ORDER — HYDROMORPHONE HCL PF 2 MG/ML IJ SOLN
INTRAMUSCULAR | Status: AC
  Filled 2013-01-13: qty 1

## 2013-01-13 MED ORDER — HYDROMORPHONE HCL PF 1 MG/ML IJ SOLN
INTRAMUSCULAR | Status: AC
  Filled 2013-01-13: qty 1

## 2013-01-13 MED ORDER — METOCLOPRAMIDE HCL 5 MG/ML IJ SOLN
5.0000 mg | Freq: Three times a day (TID) | INTRAMUSCULAR | Status: DC | PRN
Start: 2013-01-13 — End: 2013-01-14

## 2013-01-13 MED ORDER — PROPOFOL 10 MG/ML IV BOLUS
INTRAVENOUS | Status: AC
  Filled 2013-01-13: qty 20

## 2013-01-13 MED ORDER — METOCLOPRAMIDE HCL 10 MG PO TABS
5.0000 mg | ORAL_TABLET | Freq: Three times a day (TID) | ORAL | Status: DC | PRN
  Administered 2013-01-14: 10 mg via ORAL
  Filled 2013-01-13: qty 1

## 2013-01-13 MED ORDER — MORPHINE SULFATE 2 MG/ML IJ SOLN: 1.0000 mg | INTRAMUSCULAR | Status: DC | PRN

## 2013-01-13 MED ORDER — MIDAZOLAM HCL 5 MG/5ML IJ SOLN
INTRAMUSCULAR | Status: DC | PRN
  Administered 2013-01-13 (×2): 1 mg via INTRAVENOUS

## 2013-01-13 MED ORDER — HYDROMORPHONE HCL PF 1 MG/ML IJ SOLN
0.2500 mg | INTRAMUSCULAR | Status: DC | PRN
  Administered 2013-01-13 (×2): 0.25 mg via INTRAVENOUS

## 2013-01-13 MED ORDER — ONDANSETRON HCL 4 MG/2ML IJ SOLN
4.0000 mg | Freq: Four times a day (QID) | INTRAMUSCULAR | Status: DC | PRN
  Administered 2013-01-14: 4 mg via INTRAVENOUS
  Filled 2013-01-13: qty 2

## 2013-01-13 MED ORDER — LACTATED RINGERS IV SOLN: INTRAVENOUS | Status: DC

## 2013-01-13 MED ORDER — CHLORHEXIDINE GLUCONATE 4 % EX LIQD
60.0000 mL | Freq: Once | CUTANEOUS | Status: DC
Start: 2013-01-13 — End: 2013-01-13

## 2013-01-13 MED ORDER — TRAMADOL HCL 50 MG PO TABS
50.0000 mg | ORAL_TABLET | Freq: Four times a day (QID) | ORAL | Status: DC | PRN
  Administered 2013-01-14: 100 mg via ORAL
  Filled 2013-01-13: qty 2

## 2013-01-13 MED ORDER — OXYCODONE HCL 5 MG PO TABS
5.0000 mg | ORAL_TABLET | ORAL | Status: DC | PRN
  Administered 2013-01-13: 5 mg via ORAL
  Filled 2013-01-13: qty 1

## 2013-01-13 MED ORDER — ONDANSETRON HCL 4 MG/2ML IJ SOLN
INTRAMUSCULAR | Status: DC | PRN
  Administered 2013-01-13: 4 mg via INTRAVENOUS

## 2013-01-13 MED ORDER — ENOXAPARIN SODIUM 40 MG/0.4ML ~~LOC~~ SOLN
40.0000 mg | SUBCUTANEOUS | Status: DC
  Administered 2013-01-14: 40 mg via SUBCUTANEOUS
  Filled 2013-01-13 (×2): qty 0.4

## 2013-01-13 MED ORDER — SUCCINYLCHOLINE CHLORIDE 20 MG/ML IJ SOLN
INTRAMUSCULAR | Status: AC
  Filled 2013-01-13: qty 1

## 2013-01-13 MED ORDER — DEXAMETHASONE SODIUM PHOSPHATE 10 MG/ML IJ SOLN
INTRAMUSCULAR | Status: DC | PRN
  Administered 2013-01-13: 10 mg via INTRAVENOUS

## 2013-01-13 MED ORDER — ACETAMINOPHEN 10 MG/ML IV SOLN
1000.0000 mg | Freq: Once | INTRAVENOUS | Status: DC
  Filled 2013-01-13: qty 100

## 2013-01-13 MED ORDER — CEFAZOLIN SODIUM-DEXTROSE 2-3 GM-% IV SOLR
2.0000 g | INTRAVENOUS | Status: AC
  Administered 2013-01-13: 2 g via INTRAVENOUS

## 2013-01-13 MED ORDER — METOPROLOL SUCCINATE ER 100 MG PO TB24
100.0000 mg | ORAL_TABLET | Freq: Every day | ORAL | Status: DC
Start: 2013-01-14 — End: 2013-01-14
  Administered 2013-01-14: 100 mg via ORAL
  Filled 2013-01-13 (×2): qty 1

## 2013-01-13 MED ORDER — METHOCARBAMOL 500 MG PO TABS: 500.0000 mg | ORAL_TABLET | Freq: Four times a day (QID) | ORAL | Status: DC | PRN

## 2013-01-13 MED ORDER — METHOCARBAMOL 100 MG/ML IJ SOLN
500.0000 mg | Freq: Four times a day (QID) | INTRAVENOUS | Status: DC | PRN
  Administered 2013-01-13: 500 mg via INTRAVENOUS
  Filled 2013-01-13: qty 5

## 2013-01-13 MED ORDER — PROPOFOL 10 MG/ML IV BOLUS
INTRAVENOUS | Status: DC | PRN
  Administered 2013-01-13: 150 mg via INTRAVENOUS

## 2013-01-13 MED ORDER — CEFAZOLIN SODIUM 1-5 GM-% IV SOLN
1.0000 g | Freq: Four times a day (QID) | INTRAVENOUS | Status: AC
  Administered 2013-01-13 – 2013-01-14 (×3): 1 g via INTRAVENOUS
  Filled 2013-01-13 (×3): qty 50

## 2013-01-13 MED ORDER — 0.9 % SODIUM CHLORIDE (POUR BTL) OPTIME
TOPICAL | Status: DC | PRN
  Administered 2013-01-13: 1000 mL

## 2013-01-13 MED ORDER — DEXAMETHASONE SODIUM PHOSPHATE 10 MG/ML IJ SOLN
INTRAMUSCULAR | Status: AC
  Filled 2013-01-13: qty 1

## 2013-01-13 MED ORDER — ONDANSETRON HCL 4 MG/2ML IJ SOLN
INTRAMUSCULAR | Status: AC
  Filled 2013-01-13: qty 2

## 2013-01-13 SURGICAL SUPPLY — 43 items
BANDAGE ELASTIC 6 VELCRO ST LF (GAUZE/BANDAGES/DRESSINGS) IMPLANT
BANDAGE ESMARK 6X9 LF (GAUZE/BANDAGES/DRESSINGS) IMPLANT
BNDG ESMARK 6X9 LF (GAUZE/BANDAGES/DRESSINGS)
CLOSURE STERI-STRIP 1/4X4 (GAUZE/BANDAGES/DRESSINGS) ×2 IMPLANT
CUFF TOURN SGL QUICK 18 (TOURNIQUET CUFF) IMPLANT
CUFF TOURN SGL QUICK 34 (TOURNIQUET CUFF)
CUFF TRNQT CYL 34X4X40X1 (TOURNIQUET CUFF) IMPLANT
DRAPE C-ARM 42X120 X-RAY (DRAPES) ×2 IMPLANT
DRAPE C-ARMOR (DRAPES) IMPLANT
DRAPE EXTREMITY T 121X128X90 (DRAPE) IMPLANT
DRAPE INCISE IOBAN 66X45 STRL (DRAPES) ×2 IMPLANT
DRAPE ORTHO SPLIT 77X108 STRL (DRAPES)
DRAPE SURG ORHT 6 SPLT 77X108 (DRAPES) IMPLANT
DRSG ADAPTIC 3X8 NADH LF (GAUZE/BANDAGES/DRESSINGS) IMPLANT
DRSG MEPILEX BORDER 4X4 (GAUZE/BANDAGES/DRESSINGS) ×2 IMPLANT
DURAPREP 26ML APPLICATOR (WOUND CARE) ×2 IMPLANT
ELECT REM PT RETURN 9FT ADLT (ELECTROSURGICAL) ×2
ELECTRODE REM PT RTRN 9FT ADLT (ELECTROSURGICAL) ×1 IMPLANT
GLOVE BIO SURGEON STRL SZ7.5 (GLOVE) ×2 IMPLANT
GLOVE BIO SURGEON STRL SZ8 (GLOVE) ×4 IMPLANT
GLOVE BIOGEL PI IND STRL 8 (GLOVE) ×3 IMPLANT
GLOVE BIOGEL PI INDICATOR 8 (GLOVE) ×3
GOWN PREVENTION PLUS LG XLONG (DISPOSABLE) ×2 IMPLANT
GOWN STRL REIN XL XLG (GOWN DISPOSABLE) ×2 IMPLANT
GUIDEPIN 3.2X17.5 THRD DISP (PIN) ×4 IMPLANT
KIT BASIN OR (CUSTOM PROCEDURE TRAY) ×2 IMPLANT
MANIFOLD NEPTUNE II (INSTRUMENTS) ×2 IMPLANT
NS IRRIG 1000ML POUR BTL (IV SOLUTION) ×2 IMPLANT
PACK TOTAL JOINT (CUSTOM PROCEDURE TRAY) ×2 IMPLANT
PAD ABD 8X10 STRL (GAUZE/BANDAGES/DRESSINGS) IMPLANT
PADDING CAST COTTON 6X4 STRL (CAST SUPPLIES) IMPLANT
POSITIONER SURGICAL ARM (MISCELLANEOUS) IMPLANT
SCREW LAG 10.5MMX105MM HFN (Screw) ×2 IMPLANT
SPONGE GAUZE 4X4 12PLY (GAUZE/BANDAGES/DRESSINGS) IMPLANT
STAPLER VISISTAT 35W (STAPLE) IMPLANT
STRIP CLOSURE SKIN 1/2X4 (GAUZE/BANDAGES/DRESSINGS) ×2 IMPLANT
SUT MNCRL AB 4-0 PS2 18 (SUTURE) ×2 IMPLANT
SUT VIC AB 0 CT1 36 (SUTURE) IMPLANT
SUT VIC AB 2-0 CT1 27 (SUTURE) ×1
SUT VIC AB 2-0 CT1 TAPERPNT 27 (SUTURE) ×1 IMPLANT
TOWEL OR 17X26 10 PK STRL BLUE (TOWEL DISPOSABLE) ×4 IMPLANT
UNDERPAD 30X30 INCONTINENT (UNDERPADS AND DIAPERS) IMPLANT
WATER STERILE IRR 1500ML POUR (IV SOLUTION) IMPLANT

## 2013-01-13 NOTE — Transfer of Care (Signed)
Immediate Anesthesia Transfer of Care Note  Patient: Joseph [name]  Procedure(s) Performed: Procedure(s): HARDWARE REVISION LEFT HIP AND EXCHANGE OF COMPRESSION SCREW (Left)  Patient Location: PACU  Anesthesia Type:General  Level of Consciousness: sedated and patient cooperative  Airway & Oxygen Therapy: Patient Spontanous Breathing and Patient connected to face mask oxygen  Post-op Assessment: Report given to PACU RN and Post -op Vital signs reviewed and stable  Post vital signs: Reviewed and stable  Complications: No apparent anesthesia complications

## 2013-01-13 NOTE — Preoperative (Signed)
Beta Blockers   Reason not to administer Beta Blockers:Not Applicable 

## 2013-01-13 NOTE — Anesthesia Postprocedure Evaluation (Signed)
  Anesthesia Post-op Note  Patient: Joseph [name]  Procedure(s) Performed: Procedure(s) (LRB): HARDWARE REVISION LEFT HIP AND EXCHANGE OF COMPRESSION SCREW (Left)  Patient Location: PACU  Anesthesia Type: General  Level of Consciousness: awake and alert   Airway and Oxygen Therapy: Patient Spontanous Breathing  Post-op Pain: mild  Post-op Assessment: Post-op Vital signs reviewed, Patient's Cardiovascular Status Stable, Respiratory Function Stable, Patent Airway and No signs of Nausea or vomiting  Last Vitals:  Filed Vitals:   01/13/13 1830  BP:   Pulse: 70  Temp:   Resp: 14    Post-op Vital Signs: stable   Complications: No apparent anesthesia complications

## 2013-01-13 NOTE — Brief Op Note (Signed)
01/13/2013  5:34 PM  PATIENT:  Joseph [name]  79 y.o. male  PRE-OPERATIVE DIAGNOSIS:  left hip painful hardware  POST-OPERATIVE DIAGNOSIS:  Left hip painful hardware  PROCEDURE:  Procedure(s): HARDWARE REVISION LEFT HIP AND EXCHANGE OF COMPRESSION SCREW (Left)  SURGEON:  Surgeon(s) and Role:    * Loanne Drilling, MD - Primary  PHYSICIAN ASSISTANT:   ASSISTANTS: Avel Peace, PA-C   ANESTHESIA:   general  EBL:   minimal  COUNTS:  YES  TOURNIQUET:  * No tourniquets in log *  DICTATION: .Other Dictation: Dictation Number [id number]  PLAN OF CARE: Admit for overnight observation  PATIENT DISPOSITION:  PACU - hemodynamically stable.

## 2013-01-13 NOTE — Interval H&P Note (Signed)
History and Physical Interval Note:  01/13/2013 4:14 PM  Joseph [name]  has presented today for surgery, with the diagnosis of left hip painful hardware  The various methods of treatment have been discussed with the patient and family. After consideration of risks, benefits and other options for treatment, the patient has consented to  Procedure(s): HARDWARE REVISION LEFT HIP AND EXCHANGE OF COMPRESSION SCREW (Left) as a surgical intervention .  The patient's history has been reviewed, patient examined, no change in status, stable for surgery.  I have reviewed the patient's chart and labs.  Questions were answered to the patient's satisfaction.     Loanne Drilling

## 2013-01-13 NOTE — Anesthesia Preprocedure Evaluation (Addendum)
Anesthesia Evaluation  Patient identified by MRN, date of birth, ID band Patient awake    Reviewed: Allergy & Precautions, H&P , NPO status , Patient's Chart, lab work & pertinent test results, reviewed documented beta blocker date and time   Airway Mallampati: II TM Distance: >3 FB Neck ROM: full    Dental  (+) Missing and Dental Advisory Given All right upper lateral and all left side upper missing.  Many lower lateral teeth missing too.:   Pulmonary neg pulmonary ROS,  breath sounds clear to auscultation  Pulmonary exam normal       Cardiovascular hypertension, Pt. on home beta blockers Rhythm:regular Rate:Normal     Neuro/Psych negative neurological ROS  negative psych ROS   GI/Hepatic Neg liver ROS, GERD-  Medicated and Controlled,Esophageal motility disorder. Dysphagia. achalasia   Endo/Other  negative endocrine ROS  Renal/GU negative Renal ROS  negative genitourinary   Musculoskeletal   Abdominal   Peds  Hematology negative hematology ROS (+)   Anesthesia Other Findings   Reproductive/Obstetrics negative OB ROS                          Anesthesia Physical Anesthesia Plan  ASA: III  Anesthesia Plan: General   Post-op Pain Management:    Induction: Intravenous  Airway Management Planned: Oral ETT  Additional Equipment:   Intra-op Plan:   Post-operative Plan: Extubation in OR  Informed Consent: I have reviewed the patients History and Physical, chart, labs and discussed the procedure including the risks, benefits and alternatives for the proposed anesthesia with the patient or authorized representative who has indicated his/her understanding and acceptance.   Dental Advisory Given  Plan Discussed with: CRNA and Surgeon  Anesthesia Plan Comments:         Anesthesia Quick Evaluation

## 2013-01-13 NOTE — H&P (Signed)
Joseph [name] is an 79 y.o. male.   Chief Complaint: Left hip pain HPI: 79 yo male who sustained a left subtrochanteric femur fracture approximately a year ago treated with IM nailing. He did very well with return of function and relief of pain until approximately 2 months ago. He developed sudden onset of lateral hip pain which has gotten progressively worse in the past month. It only occurs with weight bearing and not at rest. It responded well for 48 hours to a steroid injection but the pain recurred after 48 hours. It is felt that this is hardware related pain since it did not start until approximately 9 months post injury. He present today for removal of the compression lag screw and associated bursa with placement of a shorter screw.  Past Medical History  Diagnosis Date  . Hypertension   . Seasonal allergies   . History of kidney stones   . GERD (gastroesophageal reflux disease)   . Arthritis     Past Surgical History  Procedure Laterality Date  . Femur im nail  01/18/2012    Procedure: INTRAMEDULLARY (IM) NAIL FEMORAL;  Surgeon: Loanne Drilling, MD;  Location: [facility] OR;  Service: Orthopedics;  Laterality: Left;  . Hiatal hernia repair  2011  . Joint replacement Left 12/2011    RIGHT KNEE REPLACEMENT "LONG TIME AGO"    Family History  Problem Relation Age of Onset  . Colon cancer Neg Hx   . Esophageal cancer Neg Hx   . Rectal cancer Neg Hx   . Stomach cancer Neg Hx    Social History:  reports that he has never smoked. He has never used smokeless tobacco. He reports that he does not drink alcohol or use illicit drugs.  Allergies: No Known Allergies  Medications Prior to Admission  Medication Sig Dispense Refill  . DiphenhydrAMINE HCl (BENADRYL ALLERGY PO) Take 1 tablet by mouth daily.      Marland Kitchen esomeprazole (NEXIUM) 20 MG capsule Take 20 mg by mouth daily as needed (acid reflex).       Marland Kitchen esomeprazole (NEXIUM) 40 MG capsule Take 40 mg by mouth daily at 12 noon. TAKES EVERY AM       . metoprolol succinate (TOPROL-XL) 100 MG 24 hr tablet Take 100 mg by mouth daily with breakfast.       . naproxen sodium (ANAPROX) 220 MG tablet Take 220 mg by mouth as needed (pain). WOULD TAKE ONE OR TWO IF NEEDED FOR PAIN      . traMADol (ULTRAM) 50 MG tablet Take 50-100 mg by mouth every 6 (six) hours as needed for moderate pain or severe pain.      Marland Kitchen oxyCODONE (OXY IR/ROXICODONE) 5 MG immediate release tablet Take 5 mg by mouth. PT IS TAKING ONE EVERY NIGHT FOR LEG PAIN        No results found for this or any previous visit (from the past 48 hour(s)). No results found.  ROS  Blood pressure 168/90, pulse 59, temperature 97.6 F (36.4 C), temperature source Oral, resp. rate 16, SpO2 100.00%. Physical Exam  Physical Examination: General appearance - alert, well appearing, and in no distress Mental status - alert, oriented to person, place, and time Chest - clear to auscultation, no wheezes, rales or rhonchi, symmetric air entry Heart - normal rate, regular rhythm, normal S1, S2, no murmurs, rubs, clicks or gallops Abdomen - soft, nontender, nondistended, no masses or organomegaly Neurological - alert, oriented, normal speech, no focal findings or movement disorder  noted Tender over lateral aspect of left hip to palpation. No pain on hip range of motion.   Radiographs show hardware remains in good position with healing of the majority of the fracture with the exception of the lateral aspect   Assessment/Plan Left hip pain- Plan hardware exchange as above. Discussed procedure, risks and potential complications with patient and his son and they elect to proceed.  Loanne Drilling 01/13/2013, 4:08 PM

## 2013-01-14 ENCOUNTER — Encounter (HOSPITAL_COMMUNITY): Payer: Self-pay | Admitting: Orthopedic Surgery

## 2013-01-14 MED ORDER — TRAMADOL HCL 50 MG PO TABS: 50.0000 mg | ORAL_TABLET | Freq: Four times a day (QID) | ORAL | Status: DC | PRN

## 2013-01-14 MED ORDER — METHOCARBAMOL 500 MG PO TABS: 500.0000 mg | ORAL_TABLET | Freq: Four times a day (QID) | ORAL | Status: DC | PRN

## 2013-01-14 MED ORDER — ASPIRIN EC 81 MG PO TBEC: 81.0000 mg | DELAYED_RELEASE_TABLET | Freq: Every day | ORAL | Status: DC

## 2013-01-14 MED ORDER — OXYCODONE HCL 5 MG PO TABS: 5.0000 mg | ORAL_TABLET | ORAL | Status: DC | PRN

## 2013-01-14 NOTE — Discharge Summary (Signed)
Physician Discharge Summary   Patient ID: Joseph [name] MRN: [id number] DOB/AGE: 1931/03/28 79 y.o.  Admit date: 01/13/2013 Discharge date: 01/14/2013  Primary Diagnosis:  Left hip painful hardware  Admission Diagnoses:  Past Medical History  Diagnosis Date  . Hypertension   . Seasonal allergies   . History of kidney stones   . GERD (gastroesophageal reflux disease)   . Arthritis    Discharge Diagnoses:   Principal Problem:   Painful orthopaedic hardware  Estimated body mass index is 22.83 kg/(m^2) as calculated from the following:   Height as of this encounter: 6\' 1"  (1.854 m).   Weight as of this encounter: 78.472 kg (173 lb).  Procedure(s) (LRB): HARDWARE REVISION LEFT HIP AND EXCHANGE OF COMPRESSION SCREW (Left)   Consults: None  HPI: 79 yo male who sustained a left subtrochanteric femur fracture approximately a year ago treated with IM nailing. He did very well with return of function and relief of pain until approximately 2 months ago. He developed sudden onset of lateral hip pain which has gotten progressively worse in the past month. It only occurs with weight bearing and not at rest. It responded well for 48 hours to a steroid injection but the pain recurred after 48 hours. It is felt that this is hardware related pain since it did not start until approximately 9 months post injury. He present today for removal of the compression lag screw and associated bursa with placement of a shorter screw.   Laboratory Data: Hospital Outpatient Visit on 01/08/2013  Component Date Value Range Status  . WBC 01/08/2013 7.0  4.0 - 10.5 K/uL Final  . RBC 01/08/2013 5.05  4.22 - 5.81 MIL/uL Final  . Hemoglobin 01/08/2013 14.8  13.0 - 17.0 g/dL Final  . HCT 40/98/1191 43.3  39.0 - 52.0 % Final  . MCV 01/08/2013 85.7  78.0 - 100.0 fL Final  . MCH 01/08/2013 29.3  26.0 - 34.0 pg Final  . MCHC 01/08/2013 34.2  30.0 - 36.0 g/dL Final  . RDW 47/82/9562 13.2  11.5 - 15.5 % Final  .  Platelets 01/08/2013 178  150 - 400 K/uL Final  . Sodium 01/08/2013 136  135 - 145 mEq/L Final  . Potassium 01/08/2013 4.0  3.5 - 5.1 mEq/L Final  . Chloride 01/08/2013 101  96 - 112 mEq/L Final  . CO2 01/08/2013 27  19 - 32 mEq/L Final  . Glucose, Bld 01/08/2013 96  70 - 99 mg/dL Final  . BUN 13/08/6576 20  6 - 23 mg/dL Final  . Creatinine, Ser 01/08/2013 1.27  0.50 - 1.35 mg/dL Final  . Calcium 46/96/2952 9.3  8.4 - 10.5 mg/dL Final  . GFR calc non Af Amer 01/08/2013 52* >90 mL/min Final  . GFR calc Af Amer 01/08/2013 60* >90 mL/min Final   Comment: (NOTE)                          The eGFR has been calculated using the CKD EPI equation.                          This calculation has not been validated in all clinical situations.                          eGFR's persistently <90 mL/min signify possible Chronic Kidney  Disease.     X-Rays:Dg Chest 2 View  01/08/2013   CLINICAL DATA:  Preoperative for left hip hardware revision. , history of hypertension.  EXAM: CHEST  2 VIEW  COMPARISON:  Chest x-ray of July 11, 2010.  FINDINGS: The lungs are adequately inflated. There is no focal infiltrate. The cardiopericardial silhouette is normal in size. The pulmonary vascularity is not engorged. The patient has undergone previous surgical procedures in the region of the GE junction. There is a small amount of calcification in the wall of the descending thoracic aorta. The patient has undergone previous lower anterior cervical fusion.  IMPRESSION: There is no evidence of pneumonia nor CHF nor other acute cardiopulmonary abnormality.   Electronically Signed   By: David  [place]   On: 01/08/2013 14:09   Dg Hip Operative Left  01/13/2013   CLINICAL DATA:  Lag screw placement  EXAM: OPERATIVE LEFT HIP  COMPARISON:  Single digital C-arm fluoroscopic images obtained intraoperatively at 1829 hr compared to 01/18/2012  FINDINGS: IM nail and lag screw at proximal left femur.  Incomplete  healing of an intertrochanteric/subtrochanteric fracture of the left femur.  Bones appear demineralized.  Hip joint space preserved.  IMPRESSION: Orthopedic hardware traversing an incompletely healed proximal left femoral fracture.   Electronically Signed   By: Ulyses Southward M.D.   On: 01/13/2013 18:35    EKG: Orders placed during the hospital encounter of 01/08/13  . EKG 12-LEAD  . EKG 12-LEAD     Hospital Course: Patient was admitted to [facility] and taken to the OR and underwent the above state procedure without complications.  Patient tolerated the procedure well and was later transferred to the recovery room and then to the orthopaedic floor for postoperative care.  They were given PO and IV analgesics for pain control following their surgery.  They were given 24 hours of postoperative antibiotics of  Anti-infectives   Start     Dose/Rate Route Frequency Ordered Stop   01/14/13 0600  ceFAZolin (ANCEF) IVPB 2 g/50 mL premix     2 g 100 mL/hr over 30 Minutes Intravenous On call to O.R. 01/13/13 1329 01/13/13 1651   01/13/13 2300  ceFAZolin (ANCEF) IVPB 1 g/50 mL premix     1 g 100 mL/hr over 30 Minutes Intravenous Every 6 hours 01/13/13 1847 01/14/13 1659     and started on DVT prophylaxis in the form of Lovenox but converted to Aspirin at discharge.  The patient was allowed to be WBAT with therapy. Discharge planning was consulted to help with postop disposition and equipment needs.  Patient had a good night on the evening of surgery.  They started to get up OOB with therapy on day one.  Patient was seen in rounds and was ready to go home later that same day.  He could tell and immediate difference after exchanging of the hardware.  Discharge Medications: Prior to Admission medications   Medication Sig Start Date End Date Taking? Authorizing Provider  DiphenhydrAMINE HCl (BENADRYL ALLERGY PO) Take 1 tablet by mouth daily.   Yes Historical Provider, MD  esomeprazole (NEXIUM) 20  MG capsule Take 20 mg by mouth daily as needed (acid reflex).    Yes Historical Provider, MD  esomeprazole (NEXIUM) 40 MG capsule Take 40 mg by mouth daily at 12 noon. TAKES EVERY AM   Yes Historical Provider, MD  metoprolol succinate (TOPROL-XL) 100 MG 24 hr tablet Take 100 mg by mouth daily with breakfast.    Yes Historical  Provider, MD  naproxen sodium (ANAPROX) 220 MG tablet Take 220 mg by mouth as needed (pain). WOULD TAKE ONE OR TWO IF NEEDED FOR PAIN   Yes Historical Provider, MD  aspirin EC 81 MG tablet Take 1 tablet (81 mg total) by mouth daily. Take daily for three weeks. 01/14/13   Alexzandrew Julien Girt, PA-C  methocarbamol (ROBAXIN) 500 MG tablet Take 1 tablet (500 mg total) by mouth every 6 (six) hours as needed for muscle spasms. 01/14/13   Alexzandrew Perkins, PA-C  oxyCODONE (OXY IR/ROXICODONE) 5 MG immediate release tablet Take 1 tablet (5 mg total) by mouth every 4 (four) hours as needed for severe pain. PT IS TAKING ONE EVERY NIGHT FOR LEG PAIN 01/14/13   Alexzandrew Julien Girt, PA-C  traMADol (ULTRAM) 50 MG tablet Take 1-2 tablets (50-100 mg total) by mouth every 6 (six) hours as needed for moderate pain or severe pain. 01/14/13   Alexzandrew Julien Girt, PA-C   Discharge home  Diet - Cardiac diet  Follow up - in 2 weeks on December the 30th  Activity - WBAT  Disposition - Home  Condition Upon Discharge - Good  D/C Meds - See DC Summary  DVT Prophylaxis - Aspirin 81 mg daily for three weeks       Discharge Orders   Future Orders Complete By Expires   Call MD / Call 911  As directed    Comments:     If you experience chest pain or shortness of breath, CALL 911 and be transported to the hospital emergency room.  If you develope a fever above 101 F, pus (white drainage) or increased drainage or redness at the wound, or calf pain, call your surgeon's office.   Change dressing  As directed    Comments:     You may change your dressing dressing daily with sterile 4 x 4 inch gauze  dressing and paper tape.  Do not submerge the incision under water.   Constipation Prevention  As directed    Comments:     Drink plenty of fluids.  Prune juice may be helpful.  You may use a stool softener, such as Colace (over the counter) 100 mg twice a day.  Use MiraLax (over the counter) for constipation as needed.   Diet - low sodium heart healthy  As directed    Discharge instructions  As directed    Comments:     Pick up stool softner and laxative for home. Do not submerge incision under water. May shower. Continue to use ice for pain and swelling from surgery.  Take an 81 mg Aspirin daily for three weeks.   Do not sit on low chairs, stoools or toilet seats, as it may be difficult to get up from low surfaces  As directed    Driving restrictions  As directed    Comments:     No driving until released by the physician.   Increase activity slowly as tolerated  As directed    Lifting restrictions  As directed    Comments:     No lifting until released by the physician.   Patient may shower  As directed    Comments:     You may shower without a dressing once there is no drainage.  Do not wash over the wound.  If drainage remains, do not shower until drainage stops.   TED hose  As directed    Comments:     Use stockings (TED hose) for 3 weeks on both leg(s).  You may remove them at night for sleeping.   Weight bearing as tolerated  As directed    Questions:     Laterality:     Extremity:         Medication List    STOP taking these medications       naproxen sodium 220 MG tablet  Commonly known as:  ANAPROX      TAKE these medications       aspirin EC 81 MG tablet  Take 1 tablet (81 mg total) by mouth daily. Take daily for three weeks.     BENADRYL ALLERGY PO  Take 1 tablet by mouth daily.     esomeprazole 20 MG capsule  Commonly known as:  NEXIUM  Take 20 mg by mouth daily as needed (acid reflex).     esomeprazole 40 MG capsule  Commonly known as:  NEXIUM    Take 40 mg by mouth daily at 12 noon. TAKES EVERY AM     methocarbamol 500 MG tablet  Commonly known as:  ROBAXIN  Take 1 tablet (500 mg total) by mouth every 6 (six) hours as needed for muscle spasms.     metoprolol succinate 100 MG 24 hr tablet  Commonly known as:  TOPROL-XL  Take 100 mg by mouth daily with breakfast.     oxyCODONE 5 MG immediate release tablet  Commonly known as:  Oxy IR/ROXICODONE  Take 1 tablet (5 mg total) by mouth every 4 (four) hours as needed for severe pain. PT IS TAKING ONE EVERY NIGHT FOR LEG PAIN     traMADol 50 MG tablet  Commonly known as:  ULTRAM  Take 1-2 tablets (50-100 mg total) by mouth every 6 (six) hours as needed for moderate pain or severe pain.       Follow-up Information   Follow up with Loanne Drilling, MD On 01/28/2013.   Specialty:  Orthopedic Surgery   Contact information:   [address] [place] [address] [phone]       Signed: Patrica Duel 01/14/2013, 8:36 AM

## 2013-01-14 NOTE — Care Management Note (Signed)
    Page 1 of 1   01/14/2013     2:04:23 PM   CARE MANAGEMENT NOTE 01/14/2013  Patient:  EZRI, LANDERS   Account Number:  [id number]  Date Initiated:  01/14/2013  Documentation initiated by:  Colleen Can  Subjective/Objective Assessment:   DX HARDWARE REMOVAL LEFT HIP     Action/Plan:   CM spoke with patient. Plans are for patient to return to his home in [place], where daughter, son amd nephrew will be caregivers. He already has RW. There are no HH needs.   Anticipated DC Date:  01/14/2013   Anticipated DC Plan:  HOME/SELF CARE      DC Planning Services  CM consult      Choice offered to / List presented to:             Status of service:  Completed, signed off Medicare Important Message given?   (If response is "NO", the following Medicare IM given date fields will be blank) Date Medicare IM given:   Date Additional Medicare IM given:    Discharge Disposition:  HOME/SELF CARE  Per UR Regulation:    If discussed at Long Length of Stay Meetings, dates discussed:    Comments:

## 2013-01-14 NOTE — Progress Notes (Signed)
Pt to d/c home. AVS reviewed, prescriptions given, and "My Chart" discussed with pt and son. Pt capable of verbalizing medications and follow-up appointments. Remains hemodynamically stable. No signs and symptoms of distress. Educated pt to return to ER in the case of SOB, dizziness, or chest pain.

## 2013-01-14 NOTE — Progress Notes (Signed)
   Subjective: 1 Day Post-Op Procedure(s) (LRB): HARDWARE REVISION LEFT HIP AND EXCHANGE OF COMPRESSION SCREW (Left) Patient reports pain as mild.   Patient seen in rounds with Dr. Lequita Halt. He can already tell a difference at this time. Patient is well, and has had no acute complaints or problems Patient is ready to go home  Objective: Vital signs in last 24 hours: Temp:  [97.5 F (36.4 C)-98.3 F (36.8 C)] 97.6 F (36.4 C) (12/16 0600) Pulse Rate:  [59-75] 75 (12/16 0600) Resp:  [11-18] 16 (12/16 0600) BP: (141-176)/(69-93) 167/90 mmHg (12/16 0600) SpO2:  [96 %-100 %] 98 % (12/16 0600) Weight:  [78.472 kg (173 lb)] 78.472 kg (173 lb) (12/15 1840)  Intake/Output from previous day:  Intake/Output Summary (Last 24 hours) at 01/14/13 0829 Last data filed at 01/14/13 0800  Gross per 24 hour  Intake   1745 ml  Output   1250 ml  Net    495 ml    Intake/Output this shift: Total I/O In: 120 [P.O.:120] Out: -   Labs: No results found for this basename: HGB,  in the last 72 hours No results found for this basename: WBC, RBC, HCT, PLT,  in the last 72 hours No results found for this basename: NA, K, CL, CO2, BUN, CREATININE, GLUCOSE, CALCIUM,  in the last 72 hours No results found for this basename: LABPT, INR,  in the last 72 hours  EXAM: General - Patient is Alert, Appropriate and Oriented Extremity - Neurovascular intact Sensation intact distally Incision - clean, dry, no drainage Motor Function - intact, moving foot and toes well on exam.   Assessment/Plan: 1 Day Post-Op Procedure(s) (LRB): HARDWARE REVISION LEFT HIP AND EXCHANGE OF COMPRESSION SCREW (Left) Procedure(s) (LRB): HARDWARE REVISION LEFT HIP AND EXCHANGE OF COMPRESSION SCREW (Left) Past Medical History  Diagnosis Date  . Hypertension   . Seasonal allergies   . History of kidney stones   . GERD (gastroesophageal reflux disease)   . Arthritis    Principal Problem:   Painful orthopaedic  hardware  Estimated body mass index is 22.83 kg/(m^2) as calculated from the following:   Height as of this encounter: 6\' 1"  (1.854 m).   Weight as of this encounter: 78.472 kg (173 lb). Up with therapy Discharge home Diet - Cardiac diet Follow up - in 2 weeks on December the 30th Activity - WBAT Disposition - Home Condition Upon Discharge - Good D/C Meds - See DC Summary DVT Prophylaxis - Aspirin 81 mg daily for three weeks  PERKINS, ALEXZANDREW 01/14/2013, 8:29 AM

## 2013-01-15 NOTE — Op Note (Signed)
NAMESALEM, LEMBKE              ACCOUNT NO.:  [id number]  MEDICAL RECORD NO.:  [id number]  LOCATION:                                 FACILITY:  PHYSICIAN:  Ollen Gross, M.D.    DATE OF BIRTH:  10/18/1931  DATE OF PROCEDURE:  01/13/2013 DATE OF DISCHARGE:  01/14/2013                              OPERATIVE REPORT   PREOPERATIVE DIAGNOSIS:  Painful hardware, left hip.  POSTOPERATIVE DIAGNOSIS:  Painful hardware, left hip.  PROCEDURE:  Hardware exchange revision, left hip.  SURGEON:  Ollen Gross, M.D.  ASSISTANT:  Alexzandrew L. Perkins, P.A.C.  ANESTHESIA:  General.  ESTIMATED BLOOD LOSS:  Minimal.  DRAINS:  None.  COMPLICATIONS:  None.  CONDITION:  Stable to recovery.  CLINICAL NOTE:  Mr. Schor is a 79 year old male who had IM nailing of a subtrochanteric femur fracture approximately a year ago.  He did extremely well with return of function and resolution of pain for approximately 10 months.  Approximately 2 months ago, he started to develop lateral hip pain which has gotten progressively worse.  He has had no change in the position of the hardware or change in the fracture itself.  There is a small area laterally that had never healed completely but nothing had changed on the x-rays.  He did respond well to a trochanteric injection for approximately 2 days, but then the pain has recurred.  The pain is getting progressively worse.  It is felt that he has irritation from a lag screw, which has backed out laterally.  He presents now for exchange of that lag screw to a shorter screw and removal of any bursal tissue associated with it.  PROCEDURE IN DETAIL:  After successful administration of general anesthetic, the patient was placed on the fracture table.  The left lower extremity in the padded traction boot, right lower extremity left on the table up and secured.  The left thigh was then prepped and draped in the usual sterile fashion.  I was able to palpate  the base of the screw and made a small incision overlying this.  We incised through the skin to the subcutaneous tissue to the fascia lata which incised in line with the skin incision.  Fascia vastus lateralis was also incised.  The screw was identified.  There is a lot of fluid that came out from the base of the screw.  There is some bursal tissue also which was removed. I attached the extraction device to the screw.  Prior to this, I placed a guide pin through the screw and it showed that went into the femoral head.  A screw was removed.  A guide pin left intact.  This is a 115 mm screw.  Exchanged it for 105 mm screw.  I placed a screw further into the femoral head, and none of the screw was protruding laterally.  There was excellent purchase on this screw.  I again palpated laterally could not feel any hardware.  We again look for further evidence of bursal tissue but there was no other tissue present.  We then thoroughly irrigated the wound with saline solution and closed the deep tissue and interrupted 2-0 Vicryl.  Subcu  with interrupted 2-0 Vicryl and subcuticular with 4-0 Monocryl.  Incisions cleaned and dried.  Steri- Strips and bulky sterile dressing were applied.  He was awakened and transported to recovery in stable condition.     Ollen Gross, M.D.     FA/MEDQ  D:  01/13/2013  T:  01/14/2013  Job:  [id number]

## 2013-07-09 ENCOUNTER — Ambulatory Visit: Payer: Medicare HMO | Attending: Family Medicine | Admitting: Physical Therapy

## 2013-07-09 DIAGNOSIS — IMO0001 Reserved for inherently not codable concepts without codable children: Secondary | ICD-10-CM | POA: Insufficient documentation

## 2013-07-09 DIAGNOSIS — R269 Unspecified abnormalities of gait and mobility: Secondary | ICD-10-CM | POA: Diagnosis not present

## 2013-07-09 DIAGNOSIS — M542 Cervicalgia: Secondary | ICD-10-CM | POA: Diagnosis not present

## 2013-07-09 DIAGNOSIS — R42 Dizziness and giddiness: Secondary | ICD-10-CM | POA: Insufficient documentation

## 2013-07-16 ENCOUNTER — Ambulatory Visit: Payer: Medicare HMO | Admitting: Physical Therapy

## 2013-07-16 DIAGNOSIS — IMO0001 Reserved for inherently not codable concepts without codable children: Secondary | ICD-10-CM | POA: Diagnosis not present

## 2013-07-23 ENCOUNTER — Ambulatory Visit: Payer: Medicare HMO | Admitting: Physical Therapy

## 2013-07-23 DIAGNOSIS — IMO0001 Reserved for inherently not codable concepts without codable children: Secondary | ICD-10-CM | POA: Diagnosis not present

## 2013-07-25 ENCOUNTER — Ambulatory Visit: Payer: Commercial Managed Care - HMO | Admitting: Physical Therapy

## 2013-07-28 ENCOUNTER — Ambulatory Visit: Payer: Commercial Managed Care - HMO | Admitting: Physical Therapy

## 2013-07-30 ENCOUNTER — Ambulatory Visit: Payer: Commercial Managed Care - HMO | Admitting: Physical Therapy

## 2013-08-06 ENCOUNTER — Ambulatory Visit: Payer: Commercial Managed Care - HMO | Admitting: Physical Therapy

## 2013-08-08 ENCOUNTER — Ambulatory Visit: Payer: Commercial Managed Care - HMO | Admitting: Physical Therapy

## 2013-08-13 ENCOUNTER — Ambulatory Visit: Payer: Commercial Managed Care - HMO | Admitting: Physical Therapy

## 2013-08-15 ENCOUNTER — Ambulatory Visit: Payer: Commercial Managed Care - HMO | Admitting: Physical Therapy

## 2014-01-16 IMAGING — CR DG CHEST 2V
2 series · 2 of 2 positions shown · non-contrast
Comparison: Chest x-ray of July 11, 2010.

CLINICAL DATA: Preoperative for left hip hardware revision. ,
history of hypertension.

EXAM:
CHEST  2 VIEW

[w chest pa]
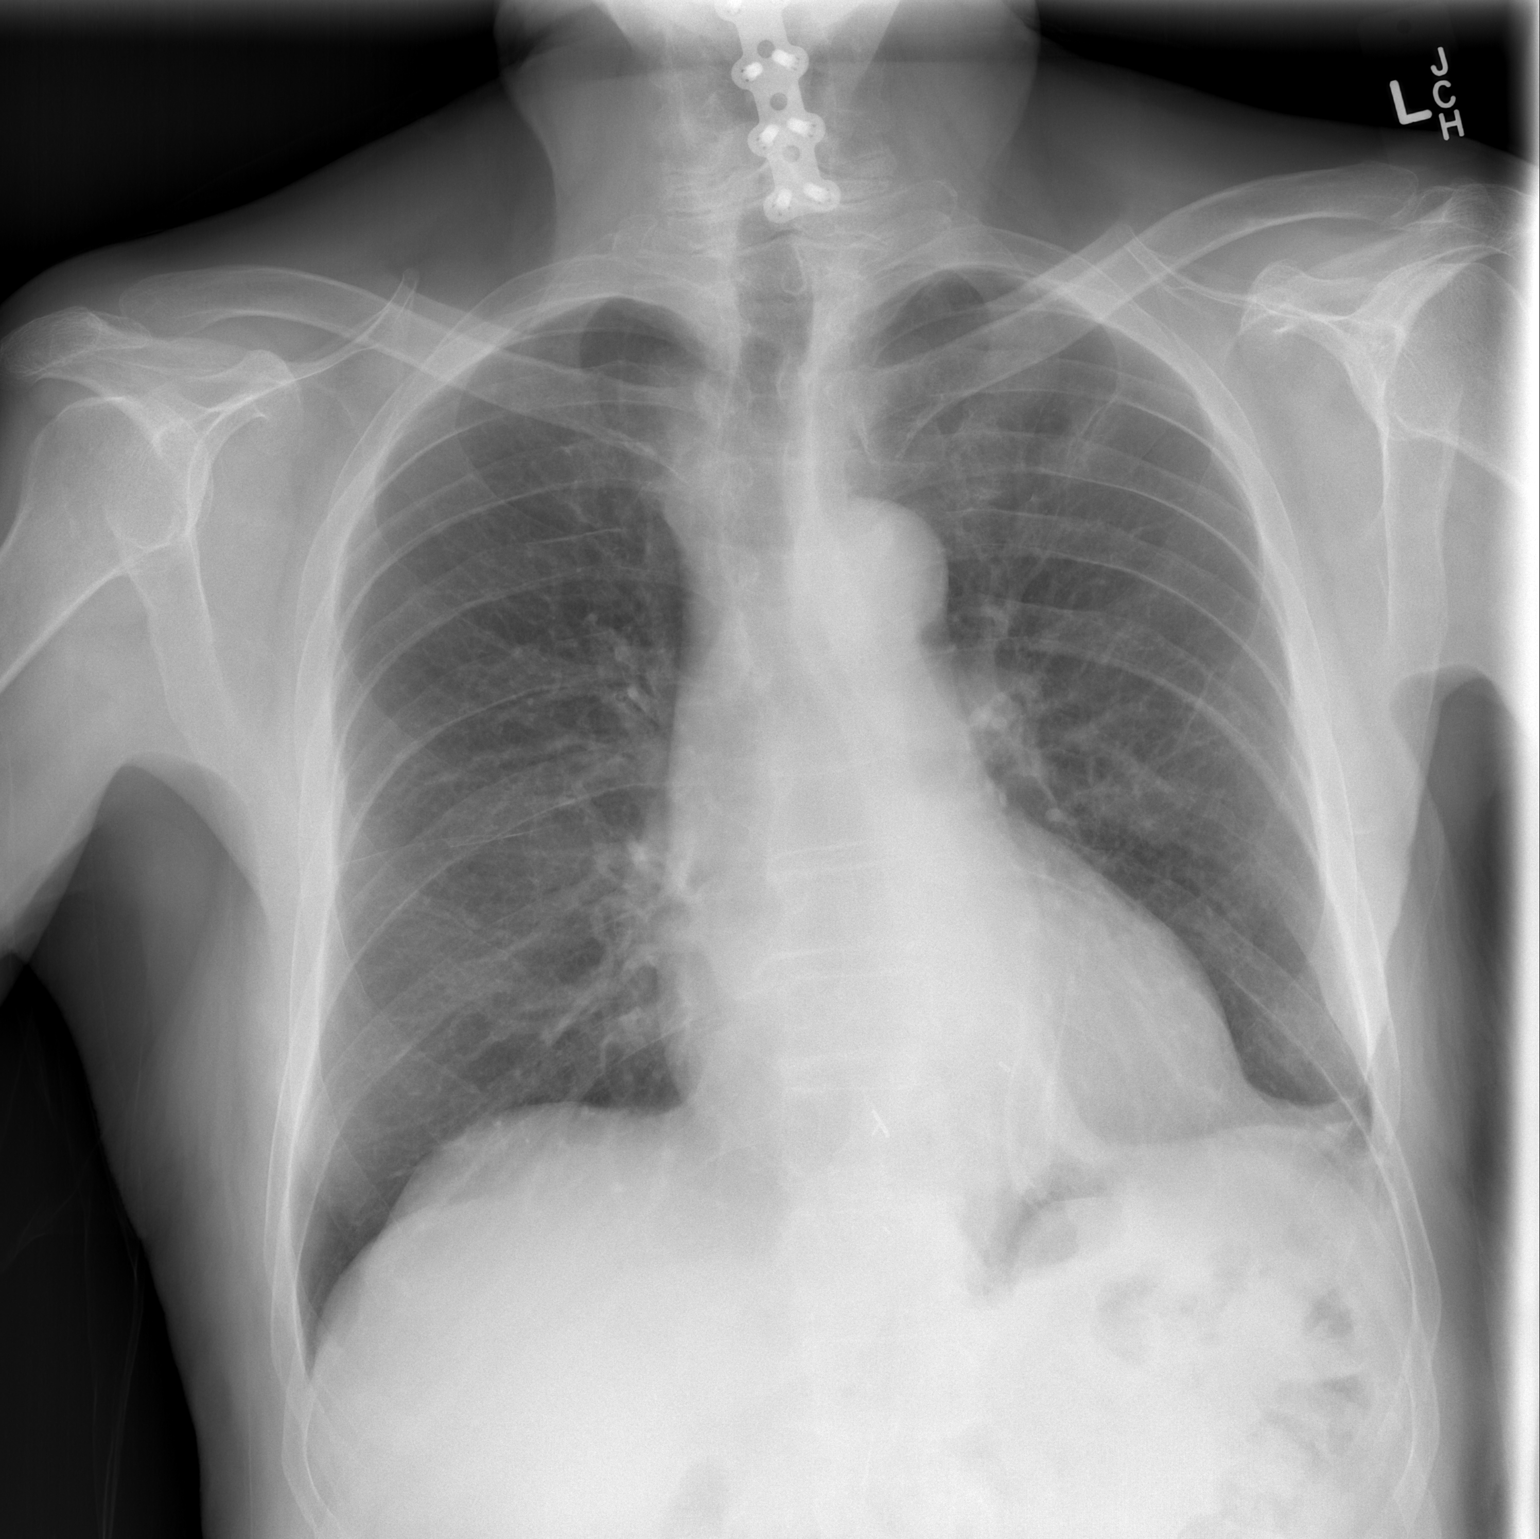

[w chest lat]
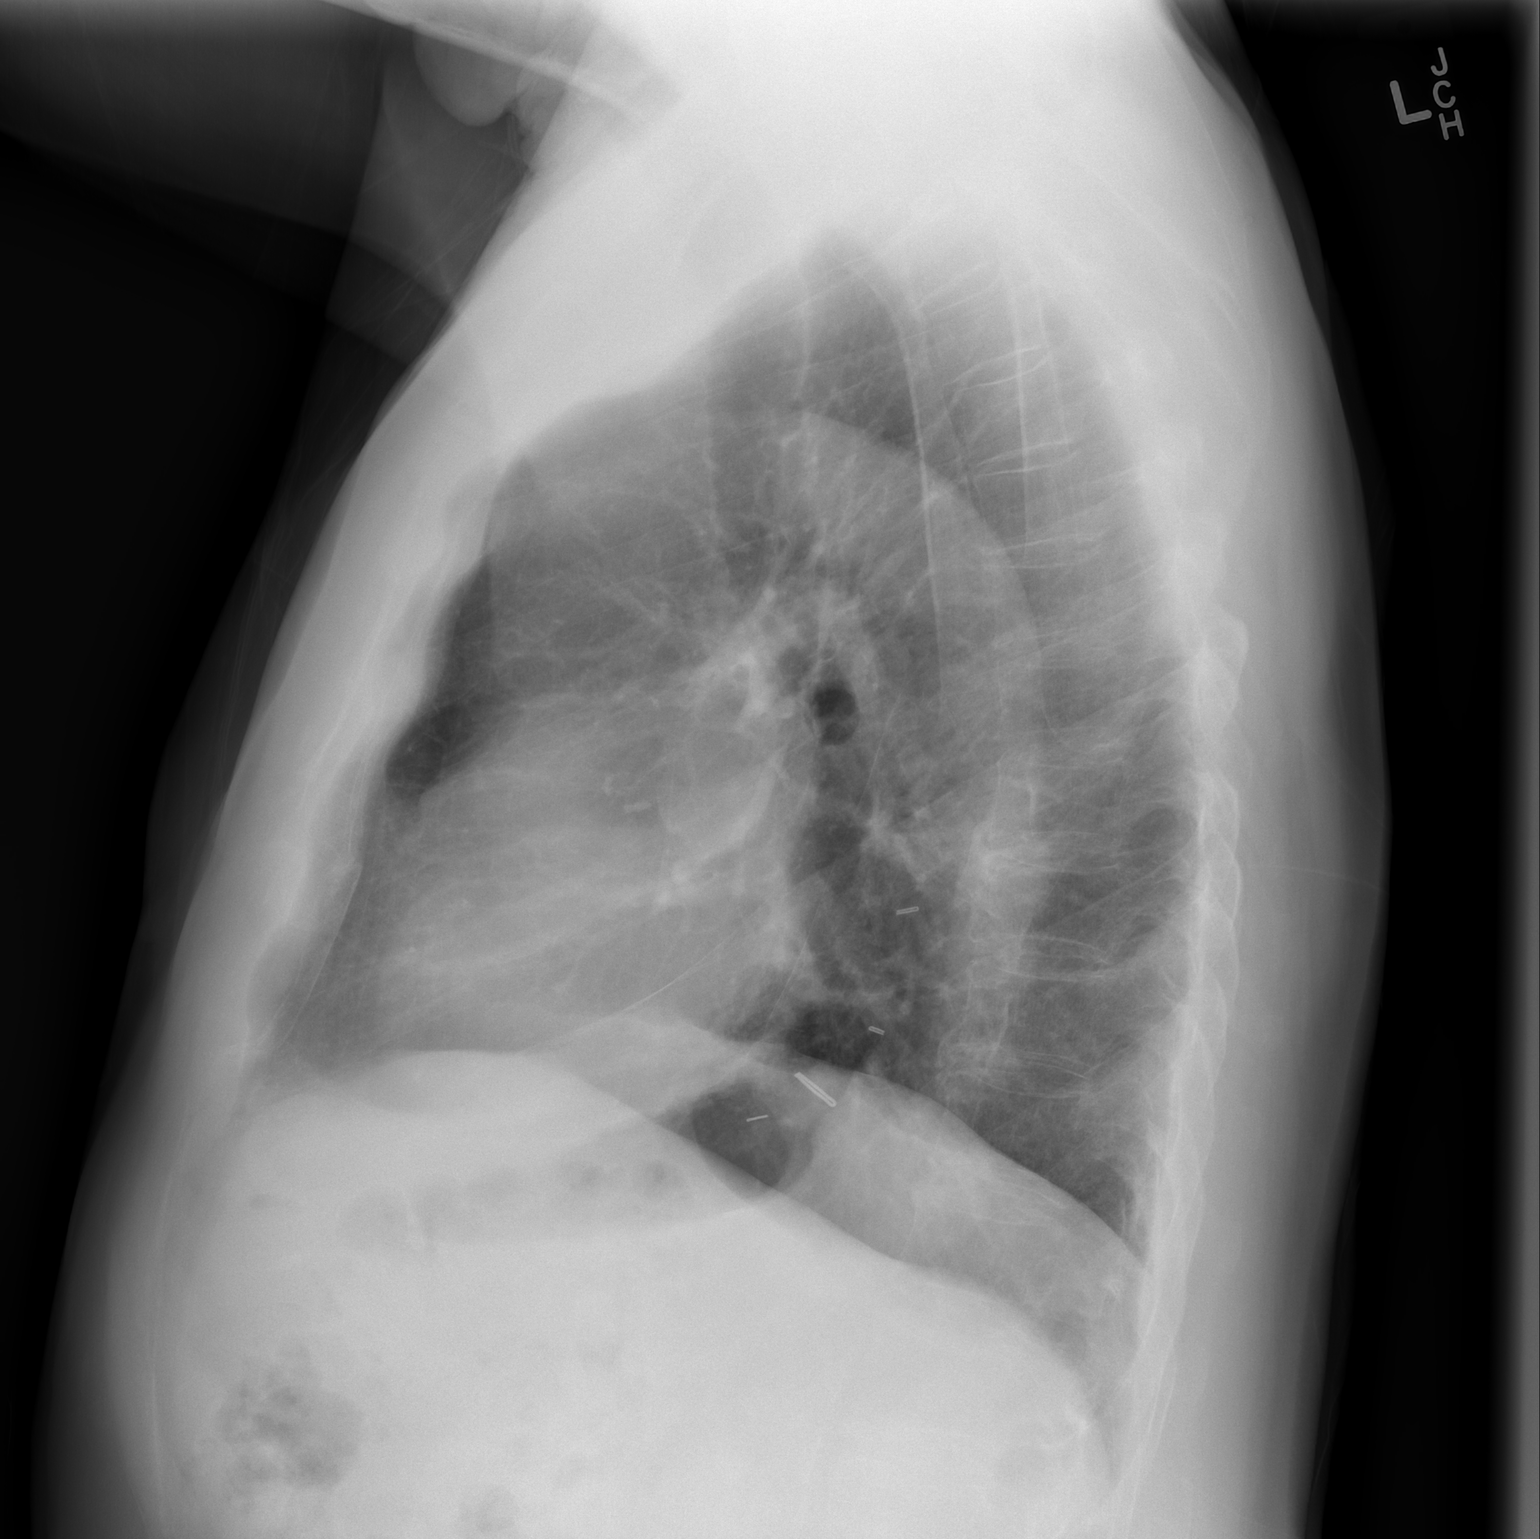

[2 of 2 positions shown; findings below may reference images not displayed]

FINDINGS: The lungs are adequately inflated. There is no focal infiltrate. The
cardiopericardial silhouette is normal in size. The pulmonary
vascularity is not engorged. The patient has undergone previous
surgical procedures in the region of the GE junction. There is a
small amount of calcification in the wall of the descending thoracic
aorta. The patient has undergone previous lower anterior cervical
fusion.
IMPRESSION: There is no evidence of pneumonia nor CHF nor other acute
cardiopulmonary abnormality.

## 2014-03-06 ENCOUNTER — Ambulatory Visit: Payer: Self-pay | Admitting: Orthopedic Surgery

## 2014-03-06 NOTE — Progress Notes (Signed)
Preoperative surgical orders have been place into the Epic hospital system for [place] on 03/06/2014, 10:49 AM  by Mickel Crow for surgery on 03-19-2014.  Preop Total Hip orders including Experel Injecion, IV Tylenol, and IV Decadron as long as there are no contraindications to the above medications. Arlee Muslim, PA-C

## 2014-03-10 ENCOUNTER — Other Ambulatory Visit (HOSPITAL_COMMUNITY): Payer: Self-pay | Admitting: *Deleted

## 2014-03-10 NOTE — Patient Instructions (Addendum)
Joseph [name]  03/10/2014   Your procedure is scheduled on: 03/19/14   Report to [facility] Main  Entrance and follow signs to               [place] at 12:45 PM.   Call this number if you have problems the morning of surgery [phone]   Remember:  Do not eat food  :After Midnight.              MAY HAVE CLEAR LIQUIDS UNTIL 9:15 AM     Take these medicines the morning of surgery with A SIP OF WATER: LORATADINE / METOPROLOL / PANTOPRAZOLE               CLEAR LIQUID DIET   Foods Allowed                                                                     Foods Excluded  Coffee and tea, regular and decaf                             liquids that you cannot  Plain Jell-O in any flavor                                             see through such as: Fruit ices (not with fruit pulp)                                     milk, soups, orange juice  Iced Popsicles                                    All solid food Carbonated beverages, regular and diet                                    Cranberry, grape and apple juices Sports drinks like Gatorade Lightly seasoned clear broth or consume(fat free) Sugar, honey syrup   _____________________________________________________________________                                 Dennis Bast may not have any metal on your body including hair pins and              piercings  Do not wear jewelry, make-up, lotions, powders or perfumes.             Do not wear nail polish.  Do not shave  48 hours prior to surgery.              Men may shave face and neck.   Do not bring valuables to the hospital. [place]  FOR VALUABLES.  Contacts, dentures or bridgework may not be worn into surgery.  Leave suitcase in the car. After surgery it may be brought to your room.     Patients discharged the day of surgery will not be allowed to drive home.  Name and phone number of your driver:  Special  Instructions: N/A              Please read over the following fact sheets you were given: _____________________________________________________________________                                                     [place]  Before surgery, you can play an important role.  Because skin is not sterile, your skin needs to be as free of germs as possible.  You can reduce the number of germs on your skin by washing with CHG (chlorahexidine gluconate) soap before surgery.  CHG is an antiseptic cleaner which kills germs and bonds with the skin to continue killing germs even after washing. Please DO NOT use if you have an allergy to CHG or antibacterial soaps.  If your skin becomes reddened/irritated stop using the CHG and inform your nurse when you arrive at Short Stay. Do not shave (including legs and underarms) for at least 48 hours prior to the first CHG shower.  You may shave your face. Please follow these instructions carefully:   1.  Shower with CHG Soap the night before surgery and the  morning of Surgery.   2.  If you choose to wash your hair, wash your hair first as usual with your  normal  Shampoo.   3.  After you shampoo, rinse your hair and body thoroughly to remove the  shampoo.                                         4.  Use CHG as you would any other liquid soap.  You can apply chg directly  to the skin and wash . Gently wash with scrungie or clean wascloth    5.  Apply the CHG Soap to your body ONLY FROM THE NECK DOWN.   Do not use on open                           Wound or open sores. Avoid contact with eyes, ears mouth and genitals (private parts).                        Genitals (private parts) with your normal soap.              6.  Wash thoroughly, paying special attention to the area where your surgery  will be performed.   7.  Thoroughly rinse your body with warm water from the neck down.   8.  DO NOT shower/wash with your normal soap after  using and rinsing off  the CHG Soap .                9.  Pat yourself dry with a clean towel.             10.  Wear  clean pajamas.             11.  Place clean sheets on your bed the night of your first shower and do not  sleep with pets.  Day of Surgery : Do not apply any lotions/deodorants the morning of surgery.  Please wear clean clothes to the hospital/surgery center.  FAILURE TO FOLLOW THESE INSTRUCTIONS MAY RESULT IN THE CANCELLATION OF YOUR SURGERY    PATIENT SIGNATURE_________________________________  ______________________________________________________________________     [name]  An incentive spirometer is a tool that can help keep your lungs clear and active. This tool measures how well you are filling your lungs with each breath. Taking long deep breaths may help reverse or decrease the chance of developing breathing (pulmonary) problems (especially infection) following:  A long period of time when you are unable to move or be active. BEFORE THE PROCEDURE   If the spirometer includes an indicator to show your best effort, your nurse or respiratory therapist will set it to a desired goal.  If possible, sit up straight or lean slightly forward. Try not to slouch.  Hold the incentive spirometer in an upright position. INSTRUCTIONS FOR USE   Sit on the edge of your bed if possible, or sit up as far as you can in bed or on a chair.  Hold the incentive spirometer in an upright position.  Breathe out normally.  Place the mouthpiece in your mouth and seal your lips tightly around it.  Breathe in slowly and as deeply as possible, raising the piston or the ball toward the top of the column.  Hold your breath for 3-5 seconds or for as long as possible. Allow the piston or ball to fall to the bottom of the column.  Remove the mouthpiece from your mouth and breathe out normally.  Rest for a few seconds and repeat Steps 1 through 7 at least 10 times  every 1-2 hours when you are awake. Take your time and take a few normal breaths between deep breaths.  The spirometer may include an indicator to show your best effort. Use the indicator as a goal to work toward during each repetition.  After each set of 10 deep breaths, practice coughing to be sure your lungs are clear. If you have an incision (the cut made at the time of surgery), support your incision when coughing by placing a pillow or rolled up towels firmly against it. Once you are able to get out of bed, walk around indoors and cough well. You may stop using the incentive spirometer when instructed by your caregiver.  RISKS AND COMPLICATIONS  Take your time so you do not get dizzy or light-headed.  If you are in pain, you may need to take or ask for pain medication before doing incentive spirometry. It is harder to take a deep breath if you are having pain. AFTER USE  Rest and breathe slowly and easily.  It can be helpful to keep track of a log of your progress. Your caregiver can provide you with a simple table to help with this. If you are using the spirometer at home, follow these instructions: [place] IF:   You are having difficultly using the spirometer.  You have trouble using the spirometer as often as instructed.  Your pain medication is not giving enough relief while using the spirometer.  You develop fever of 100.5 F (38.1 C) or higher. SEEK IMMEDIATE MEDICAL CARE IF:   You cough up bloody  sputum that had not been present before.  You develop fever of 102 F (38.9 C) or greater.  You develop worsening pain at or near the incision site. MAKE SURE YOU:   Understand these instructions.  Will watch your condition.  Will get help right away if you are not doing well or get worse. Document Released: 05/29/2006 Document Revised: 04/10/2011 Document Reviewed: 07/30/2006 ExitCare Patient Information 2014 ExitCare,  [place].   ________________________________________________________________________  WHAT IS A BLOOD TRANSFUSION? Blood Transfusion Information  A transfusion is the replacement of blood or some of its parts. Blood is made up of multiple cells which provide different functions.  Red blood cells carry oxygen and are used for blood loss replacement.  White blood cells fight against infection.  Platelets control bleeding.  Plasma helps clot blood.  Other blood products are available for specialized needs, such as hemophilia or other clotting disorders. BEFORE THE TRANSFUSION  Who gives blood for transfusions?   Healthy volunteers who are fully evaluated to make sure their blood is safe. This is blood bank blood. Transfusion therapy is the safest it has ever been in the practice of medicine. Before blood is taken from a donor, a complete history is taken to make sure that person has no history of diseases nor engages in risky social behavior (examples are intravenous drug use or sexual activity with multiple partners). The donor's travel history is screened to minimize risk of transmitting infections, such as malaria. The donated blood is tested for signs of infectious diseases, such as HIV and hepatitis. The blood is then tested to be sure it is compatible with you in order to minimize the chance of a transfusion reaction. If you or a relative donates blood, this is often done in anticipation of surgery and is not appropriate for emergency situations. It takes many days to process the donated blood. RISKS AND COMPLICATIONS Although transfusion therapy is very safe and saves many lives, the main dangers of transfusion include:   Getting an infectious disease.  Developing a transfusion reaction. This is an allergic reaction to something in the blood you were given. Every precaution is taken to prevent this. The decision to have a blood transfusion has been considered carefully by your caregiver  before blood is given. Blood is not given unless the benefits outweigh the risks. AFTER THE TRANSFUSION  Right after receiving a blood transfusion, you will usually feel much better and more energetic. This is especially true if your red blood cells have gotten low (anemic). The transfusion raises the level of the red blood cells which carry oxygen, and this usually causes an energy increase.  The nurse administering the transfusion will monitor you carefully for complications. HOME CARE INSTRUCTIONS  No special instructions are needed after a transfusion. You may find your energy is better. Speak with your caregiver about any limitations on activity for underlying diseases you may have. SEEK MEDICAL CARE IF:   Your condition is not improving after your transfusion.  You develop redness or irritation at the intravenous (IV) site. SEEK IMMEDIATE MEDICAL CARE IF:  Any of the following symptoms occur over the next 12 hours:  Shaking chills.  You have a temperature by mouth above 102 F (38.9 C), not controlled by medicine.  Chest, back, or muscle pain.  People around you feel you are not acting correctly or are confused.  Shortness of breath or difficulty breathing.  Dizziness and fainting.  You get a rash or develop hives.  You have a  decrease in urine output.  Your urine turns a dark color or changes to pink, red, or brown. Any of the following symptoms occur over the next 10 days:  You have a temperature by mouth above 102 F (38.9 C), not controlled by medicine.  Shortness of breath.  Weakness after normal activity.  The white part of the eye turns yellow (jaundice).  You have a decrease in the amount of urine or are urinating less often.  Your urine turns a dark color or changes to pink, red, or brown. Document Released: 01/14/2000 Document Revised: 04/10/2011 Document Reviewed: 09/02/2007 [facility] Patient Information 2014 [place],  [place].  _______________________________________________________________________

## 2014-03-11 ENCOUNTER — Encounter (HOSPITAL_COMMUNITY): Payer: Self-pay

## 2014-03-11 ENCOUNTER — Encounter (HOSPITAL_COMMUNITY)
Admission: RE | Admit: 2014-03-11 | Discharge: 2014-03-11 | Disposition: A | Discharge status: Home / Self Care | Payer: Commercial Managed Care - HMO | Source: Ambulatory Visit | Attending: Orthopedic Surgery | Admitting: Orthopedic Surgery

## 2014-03-11 DIAGNOSIS — X58XXXD Exposure to other specified factors, subsequent encounter: Secondary | ICD-10-CM | POA: Diagnosis not present

## 2014-03-11 DIAGNOSIS — Z79899 Other long term (current) drug therapy: Secondary | ICD-10-CM | POA: Insufficient documentation

## 2014-03-11 DIAGNOSIS — K449 Diaphragmatic hernia without obstruction or gangrene: Secondary | ICD-10-CM | POA: Diagnosis not present

## 2014-03-11 DIAGNOSIS — Z01812 Encounter for preprocedural laboratory examination: Secondary | ICD-10-CM | POA: Insufficient documentation

## 2014-03-11 DIAGNOSIS — Z96653 Presence of artificial knee joint, bilateral: Secondary | ICD-10-CM | POA: Insufficient documentation

## 2014-03-11 DIAGNOSIS — C61 Malignant neoplasm of prostate: Secondary | ICD-10-CM | POA: Insufficient documentation

## 2014-03-11 DIAGNOSIS — I10 Essential (primary) hypertension: Secondary | ICD-10-CM | POA: Insufficient documentation

## 2014-03-11 DIAGNOSIS — K219 Gastro-esophageal reflux disease without esophagitis: Secondary | ICD-10-CM | POA: Insufficient documentation

## 2014-03-11 DIAGNOSIS — S7222XK Displaced subtrochanteric fracture of left femur, subsequent encounter for closed fracture with nonunion: Secondary | ICD-10-CM | POA: Diagnosis not present

## 2014-03-11 HISTORY — DX: Type 2 diabetes mellitus without complications: E11.9

## 2014-03-11 HISTORY — DX: Personal history of other diseases of the musculoskeletal system and connective tissue: Z87.39

## 2014-03-11 HISTORY — DX: Personal history of (healed) traumatic fracture: Z87.81

## 2014-03-11 HISTORY — DX: Malignant (primary) neoplasm, unspecified: C80.1

## 2014-03-11 LAB — URINALYSIS, ROUTINE W REFLEX MICROSCOPIC
Bilirubin Urine: NEGATIVE
Hgb urine dipstick: NEGATIVE
Ketones, ur: NEGATIVE mg/dL
LEUKOCYTES UA: NEGATIVE
Nitrite: NEGATIVE
PH: 5.5 (ref 5.0–8.0)
PROTEIN: NEGATIVE mg/dL
SPECIFIC GRAVITY, URINE: 1.021 (ref 1.005–1.030)
Urobilinogen, UA: 1 mg/dL (ref 0.0–1.0)

## 2014-03-11 LAB — CBC
HCT: 43.4 % (ref 39.0–52.0)
Hemoglobin: 14.3 g/dL (ref 13.0–17.0)
MCH: 28.9 pg (ref 26.0–34.0)
MCHC: 32.9 g/dL (ref 30.0–36.0)
MCV: 87.7 fL (ref 78.0–100.0)
Platelets: 197 10*3/uL (ref 150–400)
RBC: 4.95 MIL/uL (ref 4.22–5.81)
RDW: 13.5 % (ref 11.5–15.5)
WBC: 6.7 10*3/uL (ref 4.0–10.5)

## 2014-03-11 LAB — COMPREHENSIVE METABOLIC PANEL
ALBUMIN: 4.1 g/dL (ref 3.5–5.2)
ALT: 11 U/L (ref 0–53)
ANION GAP: 4 — AB (ref 5–15)
AST: 18 U/L (ref 0–37)
Alkaline Phosphatase: 92 U/L (ref 39–117)
BILIRUBIN TOTAL: 0.6 mg/dL (ref 0.3–1.2)
BUN: 18 mg/dL (ref 6–23)
CHLORIDE: 107 mmol/L (ref 96–112)
CO2: 28 mmol/L (ref 19–32)
CREATININE: 1 mg/dL (ref 0.50–1.35)
Calcium: 9.3 mg/dL (ref 8.4–10.5)
GFR calc Af Amer: 80 mL/min — ABNORMAL LOW (ref >90)
GFR calc non Af Amer: 69 mL/min — ABNORMAL LOW (ref >90)
Glucose, Bld: 99 mg/dL (ref 70–99)
POTASSIUM: 4.4 mmol/L (ref 3.5–5.1)
Sodium: 139 mmol/L (ref 135–145)
Total Protein: 6.8 g/dL (ref 6.0–8.3)

## 2014-03-11 LAB — PROTIME-INR
INR: 1.06 (ref 0.00–1.49)
PROTHROMBIN TIME: 13.9 s (ref 11.6–15.2)

## 2014-03-11 LAB — URINE MICROSCOPIC-ADD ON

## 2014-03-11 LAB — SURGICAL PCR SCREEN
MRSA, PCR: NEGATIVE
STAPHYLOCOCCUS AUREUS: NEGATIVE

## 2014-03-11 LAB — APTT: aPTT: 31 seconds (ref 24–37)

## 2014-03-12 ENCOUNTER — Ambulatory Visit: Payer: Self-pay | Admitting: Orthopedic Surgery

## 2014-03-12 NOTE — H&P (Signed)
Joseph L. Joseph [name] DOB: [birth year] Widowed / Language: English / Race: White Male Date of Admission:  03/19/2014 CC: Left hip pain History of Present Illness The patient is a 81 year old male who comes in for a preoperative History and Physical. The patient is scheduled for a left conversion of previous hip surgery to left total hip to be performed by Dr. Dione Plover. Aluisio, MD at [facility] on 03-19-2014. The patient is a 81 year old male presenting for a post-operative visit. The patient comes in about 1 year out from left hardware exchange of the left hip (with subtroch femur FX 01/17/12).  The pain is under fair control at this time and describes their pain as mild (but more painful x2 months and also c/o buckling). They are currently on no medication for their pain. He still has considerable pain in that left hip. It is mainly activity-related. He does not have that lateral discomfort any more. Most of it is anterior and towards the groin. It is weight bearing and is associated with increased activities. He is not having pain at rest. It is limiting what he can and cannot do. He does not have the intense pain that he had last year but does have pain that it is bad enough where it is limiting him.  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 The hardware remains intact but it looks like the nail is either bent or is potentially broken at the top which would be consistent with a nonunion of the fracture. He is now ready to get the hip fixed at this time. They have been treated conservatively in the past for the above stated problem and despite conservative measures, they continue to have progressive pain and severe functional limitations and dysfunction. They have failed non-operative management including home exercise, medications. It is felt that they would benefit from undergoing total joint replacement. Risks and benefits of  the procedure have been discussed with the patient and they elect to proceed with surgery. There are no active contraindications to surgery such as ongoing infection or rapidly progressive neurological disease.  Problem List/Past Medical Bursitis, hip (M70.70) Trigger point of shoulder region (M25.519) Encounter for aftercare for healing closed traumatic fracture of left femur (S72.302D) Prostate Cancer Hypertension Hiatal Hernia Gastroesophageal Reflux Disease Kidney Stone  Allergies  No Known Drug Allergies  Family History  No pertinent family history  Social History  Living situation live alone Marital status widowed Drug/Alcohol Rehab (Previously) no Alcohol use former drinker Children 3 Exercise Exercises rarely Illicit drug use no Tobacco use Never smoker. never smoker Current work status working full time Drug/Alcohol Rehab (Currently) no Number of flights of stairs before winded 1 Pain Contract no Post-Surgical Plans Plan for home but might need SNF depending upon progress with therapy following the hip procedure. Advance Directives Living Will, Healthcare POA  Medication History  Metoprolol Tartrate (100MG  Tablet, Oral) Active. Pantoprazole Sodium (40MG  Tablet DR, Oral) Active. Loratadine (10MG  Tablet, Oral) Active. Lisinopril (20MG  Tablet, Oral) Active.  Past Surgical History ORIF LEFT HIP Total Knee Replacement bilateral  Review of Systems General Not Present- Chills, Fatigue, Fever, Memory Loss, Night Sweats, Weight Gain and Weight Loss. Skin Not Present- Eczema, Hives, Itching, Lesions and Rash. HEENT Not Present- Dentures, Double Vision, Headache, Hearing Loss, Tinnitus and Visual Loss. Respiratory Not Present- Allergies, Chronic Cough, Coughing up blood, Shortness of breath at rest and Shortness of breath with exertion. Cardiovascular  Not Present- Chest Pain, Difficulty Breathing Lying Down, Murmur, Palpitations,  Racing/skipping heartbeats and Swelling. Gastrointestinal Not Present- Abdominal Pain, Bloody Stool, Constipation, Diarrhea, Difficulty Swallowing, Heartburn, Jaundice, Loss of appetitie, Nausea and Vomiting. Male Genitourinary Not Present- Blood in Urine, Discharge, Flank Pain, Incontinence, Painful Urination, Urgency, Urinary frequency, Urinary Retention, Urinating at Night and Weak urinary stream. Musculoskeletal Present- Joint Pain. Not Present- Back Pain, Joint Swelling, Morning Stiffness, Muscle Pain, Muscle Weakness and Spasms. Neurological Not Present- Blackout spells, Difficulty with balance, Dizziness, Paralysis, Tremor and Weakness. Psychiatric Not Present- Insomnia.  Vitals Weight: 191.25 lb Height: 70.5in Body Surface Area: 2.06 m Body Mass Index: 27.05 kg/m  Pulse: 64 (Regular)  BP: 146/86 (Sitting, Left Arm, Standard)  Physical Exam General Mental Status -Alert, cooperative and good historian. General Appearance-pleasant, Not in acute distress. Orientation-Oriented X3. Build & Nutrition-Well nourished and Well developed.  Head and Neck Head-normocephalic, atraumatic . Neck Global Assessment - supple, no bruit auscultated on the right, no bruit auscultated on the left. Note: partial upper denture plate   Eye Pupil - Bilateral-Regular and Round. Motion - Bilateral-EOMI.  Chest and Lung Exam Auscultation Breath sounds - clear at anterior chest wall and clear at posterior chest wall. Adventitious sounds - No Adventitious sounds.  Cardiovascular Auscultation Rhythm - Regular rate and rhythm. Heart Sounds - S1 WNL and S2 WNL. Murmurs & Other Heart Sounds - Auscultation of the heart reveals - No Murmurs.  Abdomen Palpation/Percussion Tenderness - Abdomen is non-tender to palpation. Rigidity (guarding) - Abdomen is soft. Auscultation Auscultation of the abdomen reveals - Bowel sounds normal.  Male Genitourinary Note: Not done, not  pertinent to present illness   Musculoskeletal Note: On examination, he is alert and oriented in no apparent distress. He is walking with a significantly antalgic gait. His left hip can be flexed to 110, rotate in 20, out 30 and abduct 30 without discomfort.  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 The hardware remains intact. It looks like the partial portion of the nail is either bent or is potentially broken.   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  PCP: Dr. Coral Ceo  Topical TXA - Prostate Cancer  Anesthesia Issues: None  Signed electronically by Karlis Cregg Monika Salk, III PA-C

## 2014-03-19 ENCOUNTER — Encounter (HOSPITAL_COMMUNITY)
Admission: RE | Disposition: A | Discharge status: Home Health | Payer: Self-pay | Source: Ambulatory Visit | Attending: Orthopedic Surgery

## 2014-03-19 ENCOUNTER — Inpatient Hospital Stay (HOSPITAL_COMMUNITY): Payer: Commercial Managed Care - HMO

## 2014-03-19 ENCOUNTER — Inpatient Hospital Stay (HOSPITAL_COMMUNITY): Payer: Commercial Managed Care - HMO | Admitting: Anesthesiology

## 2014-03-19 ENCOUNTER — Inpatient Hospital Stay (HOSPITAL_COMMUNITY)
Admission: RE | Admit: 2014-03-19 | Discharge: 2014-03-23 | DRG: 467 | Disposition: A | Discharge status: Home Health | Payer: Commercial Managed Care - HMO | Source: Ambulatory Visit | Attending: Orthopedic Surgery | Admitting: Orthopedic Surgery

## 2014-03-19 ENCOUNTER — Encounter (HOSPITAL_COMMUNITY): Payer: Self-pay | Admitting: *Deleted

## 2014-03-19 DIAGNOSIS — S7222XA Displaced subtrochanteric fracture of left femur, initial encounter for closed fracture: Secondary | ICD-10-CM | POA: Diagnosis not present

## 2014-03-19 DIAGNOSIS — T8484XA Pain due to internal orthopedic prosthetic devices, implants and grafts, initial encounter: Secondary | ICD-10-CM

## 2014-03-19 DIAGNOSIS — I10 Essential (primary) hypertension: Secondary | ICD-10-CM | POA: Diagnosis present

## 2014-03-19 DIAGNOSIS — E119 Type 2 diabetes mellitus without complications: Secondary | ICD-10-CM | POA: Diagnosis present

## 2014-03-19 DIAGNOSIS — M1990 Unspecified osteoarthritis, unspecified site: Secondary | ICD-10-CM | POA: Diagnosis present

## 2014-03-19 DIAGNOSIS — M25552 Pain in left hip: Secondary | ICD-10-CM | POA: Diagnosis present

## 2014-03-19 DIAGNOSIS — Z87442 Personal history of urinary calculi: Secondary | ICD-10-CM

## 2014-03-19 DIAGNOSIS — Z8546 Personal history of malignant neoplasm of prostate: Secondary | ICD-10-CM

## 2014-03-19 DIAGNOSIS — S72009A Fracture of unspecified part of neck of unspecified femur, initial encounter for closed fracture: Secondary | ICD-10-CM | POA: Diagnosis present

## 2014-03-19 DIAGNOSIS — Z01812 Encounter for preprocedural laboratory examination: Secondary | ICD-10-CM | POA: Diagnosis not present

## 2014-03-19 DIAGNOSIS — Z96649 Presence of unspecified artificial hip joint: Secondary | ICD-10-CM

## 2014-03-19 DIAGNOSIS — D62 Acute posthemorrhagic anemia: Secondary | ICD-10-CM | POA: Diagnosis not present

## 2014-03-19 DIAGNOSIS — Z96653 Presence of artificial knee joint, bilateral: Secondary | ICD-10-CM | POA: Diagnosis present

## 2014-03-19 DIAGNOSIS — Z79899 Other long term (current) drug therapy: Secondary | ICD-10-CM

## 2014-03-19 DIAGNOSIS — K219 Gastro-esophageal reflux disease without esophagitis: Secondary | ICD-10-CM | POA: Diagnosis present

## 2014-03-19 DIAGNOSIS — X58XXXD Exposure to other specified factors, subsequent encounter: Secondary | ICD-10-CM | POA: Diagnosis present

## 2014-03-19 DIAGNOSIS — Z96642 Presence of left artificial hip joint: Secondary | ICD-10-CM | POA: Diagnosis not present

## 2014-03-19 HISTORY — PX: CONVERSION TO TOTAL HIP: SHX5784

## 2014-03-19 LAB — GLUCOSE, CAPILLARY
GLUCOSE-CAPILLARY: 99 mg/dL (ref 70–99)
Glucose-Capillary: 147 mg/dL — ABNORMAL HIGH (ref 70–99)
Glucose-Capillary: 183 mg/dL — ABNORMAL HIGH (ref 70–99)

## 2014-03-19 SURGERY — CONVERSION, PREVIOUS HIP SURGERY, TO TOTAL HIP ARTHROPLASTY
Anesthesia: General | Site: Hip | Laterality: Left

## 2014-03-19 MED ORDER — PHENOL 1.4 % MT LIQD: 1.0000 | OROMUCOSAL | Status: DC | PRN

## 2014-03-19 MED ORDER — HYDROMORPHONE HCL 2 MG/ML IJ SOLN
INTRAMUSCULAR | Status: AC
  Filled 2014-03-19: qty 1

## 2014-03-19 MED ORDER — ESMOLOL HCL 10 MG/ML IV SOLN
INTRAVENOUS | Status: DC | PRN
  Administered 2014-03-19: 20 mg via INTRAVENOUS
  Administered 2014-03-19: 10 mg via INTRAVENOUS
  Administered 2014-03-19: 20 mg via INTRAVENOUS
  Administered 2014-03-19: 30 mg via INTRAVENOUS

## 2014-03-19 MED ORDER — PANTOPRAZOLE SODIUM 40 MG PO TBEC
40.0000 mg | DELAYED_RELEASE_TABLET | Freq: Every day | ORAL | Status: DC
  Administered 2014-03-20 – 2014-03-23 (×4): 40 mg via ORAL
  Filled 2014-03-19 (×4): qty 1

## 2014-03-19 MED ORDER — HYDROMORPHONE HCL 1 MG/ML IJ SOLN: 0.2500 mg | INTRAMUSCULAR | Status: DC | PRN

## 2014-03-19 MED ORDER — GLYCOPYRROLATE 0.2 MG/ML IJ SOLN
INTRAMUSCULAR | Status: AC
  Filled 2014-03-19: qty 1

## 2014-03-19 MED ORDER — ONDANSETRON HCL 4 MG PO TABS
4.0000 mg | ORAL_TABLET | Freq: Four times a day (QID) | ORAL | Status: DC | PRN
  Administered 2014-03-21: 4 mg via ORAL
  Filled 2014-03-19 (×2): qty 1

## 2014-03-19 MED ORDER — SODIUM CHLORIDE 0.9 % IJ SOLN
INTRAMUSCULAR | Status: AC
  Filled 2014-03-19: qty 50

## 2014-03-19 MED ORDER — PHENYLEPHRINE HCL 10 MG/ML IJ SOLN
10.0000 mg | INTRAMUSCULAR | Status: DC | PRN
  Administered 2014-03-19: 25 ug/min via INTRAVENOUS

## 2014-03-19 MED ORDER — DEXAMETHASONE SODIUM PHOSPHATE 10 MG/ML IJ SOLN
10.0000 mg | Freq: Once | INTRAMUSCULAR | Status: AC
  Administered 2014-03-19: 10 mg via INTRAVENOUS

## 2014-03-19 MED ORDER — LACTATED RINGERS IV SOLN
INTRAVENOUS | Status: DC
  Administered 2014-03-19: 1000 mL via INTRAVENOUS
  Administered 2014-03-19 (×2): via INTRAVENOUS

## 2014-03-19 MED ORDER — TRANEXAMIC ACID 100 MG/ML IV SOLN
2000.0000 mg | Freq: Once | INTRAVENOUS | Status: DC
  Filled 2014-03-19: qty 20

## 2014-03-19 MED ORDER — SODIUM CHLORIDE 0.9 % IV SOLN: INTRAVENOUS | Status: DC

## 2014-03-19 MED ORDER — ONDANSETRON HCL 4 MG/2ML IJ SOLN
INTRAMUSCULAR | Status: AC
Start: 2014-03-19 — End: 2014-03-19
  Filled 2014-03-19: qty 2

## 2014-03-19 MED ORDER — METOCLOPRAMIDE HCL 5 MG/ML IJ SOLN
5.0000 mg | Freq: Three times a day (TID) | INTRAMUSCULAR | Status: DC | PRN
  Administered 2014-03-20: 10 mg via INTRAVENOUS
  Filled 2014-03-19: qty 2

## 2014-03-19 MED ORDER — PHENYLEPHRINE HCL 10 MG/ML IJ SOLN
INTRAMUSCULAR | Status: DC | PRN
  Administered 2014-03-19: 40 ug via INTRAVENOUS
  Administered 2014-03-19 (×6): 80 ug via INTRAVENOUS
  Administered 2014-03-19: 40 ug via INTRAVENOUS

## 2014-03-19 MED ORDER — GLYCOPYRROLATE 0.2 MG/ML IJ SOLN
INTRAMUSCULAR | Status: AC
  Filled 2014-03-19: qty 2

## 2014-03-19 MED ORDER — TRAMADOL HCL 50 MG PO TABS
50.0000 mg | ORAL_TABLET | Freq: Four times a day (QID) | ORAL | Status: DC | PRN
  Administered 2014-03-21 – 2014-03-23 (×2): 50 mg via ORAL
  Filled 2014-03-19 (×2): qty 1

## 2014-03-19 MED ORDER — BUPIVACAINE LIPOSOME 1.3 % IJ SUSP
20.0000 mL | Freq: Once | INTRAMUSCULAR | Status: DC
  Filled 2014-03-19 (×2): qty 20

## 2014-03-19 MED ORDER — METOCLOPRAMIDE HCL 10 MG PO TABS
5.0000 mg | ORAL_TABLET | Freq: Three times a day (TID) | ORAL | Status: DC | PRN
  Administered 2014-03-21 – 2014-03-22 (×2): 10 mg via ORAL
  Filled 2014-03-19 (×2): qty 1

## 2014-03-19 MED ORDER — SODIUM CHLORIDE 0.9 % IV SOLN
INTRAVENOUS | Status: DC
  Administered 2014-03-19: 23:00:00 via INTRAVENOUS

## 2014-03-19 MED ORDER — PROPOFOL 10 MG/ML IV BOLUS
INTRAVENOUS | Status: AC
  Filled 2014-03-19: qty 20

## 2014-03-19 MED ORDER — ACETAMINOPHEN 10 MG/ML IV SOLN
1000.0000 mg | Freq: Once | INTRAVENOUS | Status: AC
  Administered 2014-03-19: 1000 mg via INTRAVENOUS
  Filled 2014-03-19: qty 100

## 2014-03-19 MED ORDER — LORATADINE 10 MG PO TABS
10.0000 mg | ORAL_TABLET | Freq: Every day | ORAL | Status: DC
  Administered 2014-03-20 – 2014-03-23 (×4): 10 mg via ORAL
  Filled 2014-03-19 (×4): qty 1

## 2014-03-19 MED ORDER — CEFAZOLIN SODIUM-DEXTROSE 2-3 GM-% IV SOLR
2.0000 g | Freq: Four times a day (QID) | INTRAVENOUS | Status: AC
  Administered 2014-03-19 – 2014-03-20 (×2): 2 g via INTRAVENOUS
  Filled 2014-03-19 (×2): qty 50

## 2014-03-19 MED ORDER — POLYETHYLENE GLYCOL 3350 17 G PO PACK
17.0000 g | PACK | Freq: Every day | ORAL | Status: DC | PRN
  Administered 2014-03-20 – 2014-03-22 (×2): 17 g via ORAL
  Filled 2014-03-19 (×2): qty 1

## 2014-03-19 MED ORDER — DIPHENHYDRAMINE HCL 12.5 MG/5ML PO ELIX: 12.5000 mg | ORAL_SOLUTION | ORAL | Status: DC | PRN

## 2014-03-19 MED ORDER — EPHEDRINE SULFATE 50 MG/ML IJ SOLN
INTRAMUSCULAR | Status: DC | PRN
  Administered 2014-03-19 (×2): 10 mg via INTRAVENOUS

## 2014-03-19 MED ORDER — DEXAMETHASONE SODIUM PHOSPHATE 10 MG/ML IJ SOLN
10.0000 mg | Freq: Once | INTRAMUSCULAR | Status: AC
  Administered 2014-03-20: 10 mg via INTRAVENOUS
  Filled 2014-03-19: qty 1

## 2014-03-19 MED ORDER — FENTANYL CITRATE 0.05 MG/ML IJ SOLN
INTRAMUSCULAR | Status: DC | PRN
  Administered 2014-03-19: 100 ug via INTRAVENOUS
  Administered 2014-03-19 (×3): 50 ug via INTRAVENOUS

## 2014-03-19 MED ORDER — EPHEDRINE SULFATE 50 MG/ML IJ SOLN
INTRAMUSCULAR | Status: AC
Start: 2014-03-19 — End: 2014-03-19
  Filled 2014-03-19: qty 1

## 2014-03-19 MED ORDER — ESMOLOL HCL 10 MG/ML IV SOLN
INTRAVENOUS | Status: AC
  Filled 2014-03-19: qty 10

## 2014-03-19 MED ORDER — PROMETHAZINE HCL 25 MG PO TABS: 12.5000 mg | ORAL_TABLET | Freq: Four times a day (QID) | ORAL | Status: DC | PRN

## 2014-03-19 MED ORDER — CEFAZOLIN SODIUM-DEXTROSE 2-3 GM-% IV SOLR
INTRAVENOUS | Status: AC
  Filled 2014-03-19: qty 50

## 2014-03-19 MED ORDER — CISATRACURIUM BESYLATE 20 MG/10ML IV SOLN
INTRAVENOUS | Status: AC
  Filled 2014-03-19: qty 10

## 2014-03-19 MED ORDER — ACETAMINOPHEN 650 MG RE SUPP
650.0000 mg | Freq: Four times a day (QID) | RECTAL | Status: DC | PRN
  Filled 2014-03-19: qty 1

## 2014-03-19 MED ORDER — OXYCODONE HCL 5 MG PO TABS
5.0000 mg | ORAL_TABLET | ORAL | Status: DC | PRN
  Administered 2014-03-19 – 2014-03-20 (×2): 5 mg via ORAL
  Administered 2014-03-20: 10 mg via ORAL
  Filled 2014-03-19 (×2): qty 1
  Filled 2014-03-19: qty 2

## 2014-03-19 MED ORDER — METOPROLOL SUCCINATE ER 100 MG PO TB24
100.0000 mg | ORAL_TABLET | Freq: Every day | ORAL | Status: DC
  Administered 2014-03-20 – 2014-03-23 (×4): 100 mg via ORAL
  Filled 2014-03-19 (×5): qty 1

## 2014-03-19 MED ORDER — LACTATED RINGERS IV SOLN: INTRAVENOUS | Status: DC

## 2014-03-19 MED ORDER — ONDANSETRON HCL 4 MG/2ML IJ SOLN
4.0000 mg | Freq: Four times a day (QID) | INTRAMUSCULAR | Status: DC | PRN
  Administered 2014-03-20 (×3): 4 mg via INTRAVENOUS
  Filled 2014-03-19 (×3): qty 2

## 2014-03-19 MED ORDER — LIDOCAINE HCL (CARDIAC) 20 MG/ML IV SOLN
INTRAVENOUS | Status: DC | PRN
  Administered 2014-03-19: 100 mg via INTRAVENOUS

## 2014-03-19 MED ORDER — FENTANYL CITRATE 0.05 MG/ML IJ SOLN
INTRAMUSCULAR | Status: AC
  Filled 2014-03-19: qty 5

## 2014-03-19 MED ORDER — BUPIVACAINE LIPOSOME 1.3 % IJ SUSP
INTRAMUSCULAR | Status: DC | PRN
  Administered 2014-03-19: 20 mL

## 2014-03-19 MED ORDER — DEXTROSE 5 % IV SOLN
500.0000 mg | Freq: Four times a day (QID) | INTRAVENOUS | Status: DC | PRN
  Filled 2014-03-19: qty 5

## 2014-03-19 MED ORDER — BUPIVACAINE HCL (PF) 0.25 % IJ SOLN
INTRAMUSCULAR | Status: DC | PRN
  Administered 2014-03-19: 20 mL

## 2014-03-19 MED ORDER — MENTHOL 3 MG MT LOZG: 1.0000 | LOZENGE | OROMUCOSAL | Status: DC | PRN

## 2014-03-19 MED ORDER — HYDROMORPHONE HCL 1 MG/ML IJ SOLN
INTRAMUSCULAR | Status: DC | PRN
  Administered 2014-03-19 (×5): 0.5 mg via INTRAVENOUS

## 2014-03-19 MED ORDER — SUCCINYLCHOLINE CHLORIDE 20 MG/ML IJ SOLN
INTRAMUSCULAR | Status: DC | PRN
  Administered 2014-03-19: 100 mg via INTRAVENOUS

## 2014-03-19 MED ORDER — 0.9 % SODIUM CHLORIDE (POUR BTL) OPTIME
TOPICAL | Status: DC | PRN
  Administered 2014-03-19: 1000 mL

## 2014-03-19 MED ORDER — GLYCOPYRROLATE 0.2 MG/ML IJ SOLN
INTRAMUSCULAR | Status: DC | PRN
  Administered 2014-03-19: 0.4 mg via INTRAVENOUS
  Administered 2014-03-19: 0.2 mg via INTRAVENOUS

## 2014-03-19 MED ORDER — CHLORHEXIDINE GLUCONATE 4 % EX LIQD: 60.0000 mL | Freq: Once | CUTANEOUS | Status: DC

## 2014-03-19 MED ORDER — STERILE WATER FOR IRRIGATION IR SOLN
Status: DC | PRN
  Administered 2014-03-19: 1500 mL

## 2014-03-19 MED ORDER — CISATRACURIUM BESYLATE (PF) 10 MG/5ML IV SOLN
INTRAVENOUS | Status: DC | PRN
  Administered 2014-03-19: 6 mg via INTRAVENOUS

## 2014-03-19 MED ORDER — LABETALOL HCL 5 MG/ML IV SOLN
INTRAVENOUS | Status: AC
  Filled 2014-03-19: qty 4

## 2014-03-19 MED ORDER — BUPIVACAINE HCL (PF) 0.25 % IJ SOLN
INTRAMUSCULAR | Status: AC
  Filled 2014-03-19: qty 30

## 2014-03-19 MED ORDER — DOCUSATE SODIUM 100 MG PO CAPS
100.0000 mg | ORAL_CAPSULE | Freq: Two times a day (BID) | ORAL | Status: DC
Start: 2014-03-19 — End: 2014-03-23
  Administered 2014-03-19 – 2014-03-23 (×7): 100 mg via ORAL

## 2014-03-19 MED ORDER — MORPHINE SULFATE 2 MG/ML IJ SOLN: 1.0000 mg | INTRAMUSCULAR | Status: DC | PRN

## 2014-03-19 MED ORDER — BISACODYL 10 MG RE SUPP: 10.0000 mg | Freq: Every day | RECTAL | Status: DC | PRN

## 2014-03-19 MED ORDER — SODIUM CHLORIDE 0.9 % IJ SOLN
INTRAMUSCULAR | Status: AC
  Filled 2014-03-19: qty 10

## 2014-03-19 MED ORDER — PHENYLEPHRINE 40 MCG/ML (10ML) SYRINGE FOR IV PUSH (FOR BLOOD PRESSURE SUPPORT)
PREFILLED_SYRINGE | INTRAVENOUS | Status: AC
  Filled 2014-03-19: qty 10

## 2014-03-19 MED ORDER — ACETAMINOPHEN 500 MG PO TABS
1000.0000 mg | ORAL_TABLET | Freq: Four times a day (QID) | ORAL | Status: AC
  Administered 2014-03-19 – 2014-03-20 (×3): 1000 mg via ORAL
  Administered 2014-03-20: 500 mg via ORAL
  Filled 2014-03-19 (×5): qty 2

## 2014-03-19 MED ORDER — RIVAROXABAN 10 MG PO TABS
10.0000 mg | ORAL_TABLET | Freq: Every day | ORAL | Status: DC
  Administered 2014-03-20 – 2014-03-23 (×4): 10 mg via ORAL
  Filled 2014-03-19 (×5): qty 1

## 2014-03-19 MED ORDER — NEOSTIGMINE METHYLSULFATE 10 MG/10ML IV SOLN
INTRAVENOUS | Status: DC | PRN
  Administered 2014-03-19: 3 mg via INTRAVENOUS

## 2014-03-19 MED ORDER — CEFAZOLIN SODIUM-DEXTROSE 2-3 GM-% IV SOLR
2.0000 g | INTRAVENOUS | Status: AC
  Administered 2014-03-19: 2 g via INTRAVENOUS

## 2014-03-19 MED ORDER — PHENYLEPHRINE HCL 10 MG/ML IJ SOLN
INTRAMUSCULAR | Status: AC
  Filled 2014-03-19: qty 1

## 2014-03-19 MED ORDER — ACETAMINOPHEN 325 MG PO TABS
650.0000 mg | ORAL_TABLET | Freq: Four times a day (QID) | ORAL | Status: DC | PRN
  Administered 2014-03-22: 650 mg via ORAL
  Filled 2014-03-19: qty 2

## 2014-03-19 MED ORDER — LIDOCAINE HCL (CARDIAC) 20 MG/ML IV SOLN
INTRAVENOUS | Status: AC
  Filled 2014-03-19: qty 5

## 2014-03-19 MED ORDER — PROPOFOL 10 MG/ML IV BOLUS
INTRAVENOUS | Status: DC | PRN
  Administered 2014-03-19: 130 mg via INTRAVENOUS

## 2014-03-19 MED ORDER — FLEET ENEMA 7-19 GM/118ML RE ENEM: 1.0000 | ENEMA | Freq: Once | RECTAL | Status: AC | PRN

## 2014-03-19 MED ORDER — DEXAMETHASONE SODIUM PHOSPHATE 10 MG/ML IJ SOLN
INTRAMUSCULAR | Status: AC
  Filled 2014-03-19: qty 1

## 2014-03-19 MED ORDER — METHOCARBAMOL 500 MG PO TABS
500.0000 mg | ORAL_TABLET | Freq: Four times a day (QID) | ORAL | Status: DC | PRN
  Administered 2014-03-20 – 2014-03-21 (×2): 500 mg via ORAL
  Filled 2014-03-19 (×3): qty 1

## 2014-03-19 MED ORDER — SODIUM CHLORIDE 0.9 % IJ SOLN
INTRAMUSCULAR | Status: DC | PRN
  Administered 2014-03-19: 30 mL

## 2014-03-19 MED ORDER — ONDANSETRON HCL 4 MG/2ML IJ SOLN
INTRAMUSCULAR | Status: DC | PRN
  Administered 2014-03-19: 4 mg via INTRAVENOUS

## 2014-03-19 MED ORDER — TRANEXAMIC ACID 100 MG/ML IV SOLN
2000.0000 mg | INTRAVENOUS | Status: DC | PRN
  Administered 2014-03-19: 2000 mg via TOPICAL

## 2014-03-19 SURGICAL SUPPLY — 63 items
BAG ZIPLOCK 12X15 (MISCELLANEOUS) ×2 IMPLANT
BIT DRILL 2.8X128 (BIT) ×2 IMPLANT
BLADE EXTENDED COATED 6.5IN (ELECTRODE) ×2 IMPLANT
BLADE SAW SAG 73X25 THK (BLADE) ×1
BLADE SAW SGTL 73X25 THK (BLADE) ×1 IMPLANT
CUP ACET PINNACLE SECTR 56MM (Hips) ×1 IMPLANT
DECANTER SPIKE VIAL GLASS SM (MISCELLANEOUS) ×2 IMPLANT
DEVICE TROCH REATTACH 4 CABLE (Orthopedic Implant) ×2 IMPLANT
DRAPE INCISE IOBAN 66X45 STRL (DRAPES) ×2 IMPLANT
DRAPE ORTHO SPLIT 77X108 STRL (DRAPES) ×2
DRAPE POUCH INSTRU U-SHP 10X18 (DRAPES) ×2 IMPLANT
DRAPE SURG ORHT 6 SPLT 77X108 (DRAPES) ×2 IMPLANT
DRAPE U-SHAPE 47X51 STRL (DRAPES) ×2 IMPLANT
DRSG ADAPTIC 3X8 NADH LF (GAUZE/BANDAGES/DRESSINGS) ×2 IMPLANT
DRSG MEPILEX BORDER 4X12 (GAUZE/BANDAGES/DRESSINGS) ×2 IMPLANT
DRSG MEPILEX BORDER 4X4 (GAUZE/BANDAGES/DRESSINGS) ×4 IMPLANT
DRSG MEPILEX BORDER 4X8 (GAUZE/BANDAGES/DRESSINGS) ×2 IMPLANT
DURAPREP 26ML APPLICATOR (WOUND CARE) ×2 IMPLANT
ELECT REM PT RETURN 9FT ADLT (ELECTROSURGICAL) ×2
ELECTRODE REM PT RTRN 9FT ADLT (ELECTROSURGICAL) ×1 IMPLANT
EVACUATOR 1/8 PVC DRAIN (DRAIN) ×2 IMPLANT
FACESHIELD WRAPAROUND (MASK) ×8 IMPLANT
GAUZE SPONGE 4X4 12PLY STRL (GAUZE/BANDAGES/DRESSINGS) ×2 IMPLANT
GLOVE BIO SURGEON STRL SZ7.5 (GLOVE) ×2 IMPLANT
GLOVE BIO SURGEON STRL SZ8 (GLOVE) ×2 IMPLANT
GLOVE BIOGEL PI IND STRL 8 (GLOVE) ×3 IMPLANT
GLOVE BIOGEL PI INDICATOR 8 (GLOVE) ×3
GOWN STRL REUS W/TWL LRG LVL3 (GOWN DISPOSABLE) ×2 IMPLANT
GOWN STRL REUS W/TWL XL LVL3 (GOWN DISPOSABLE) ×2 IMPLANT
GUIDEWIRE 2.6X80 BEAD TIP (Wire) ×1 IMPLANT
GUIDEWIRE BALL NOSE 80CM (WIRE) ×2 IMPLANT
GUIDWIRE 2.6X80 BEAD TIP (Wire) ×2 IMPLANT
HEAD M SROM 36MM 2 (Hips) ×1 IMPLANT
IMMOBILIZER KNEE 20 (SOFTGOODS) ×2
IMMOBILIZER KNEE 20 THIGH 36 (SOFTGOODS) ×1 IMPLANT
KIT BASIN OR (CUSTOM PROCEDURE TRAY) ×2 IMPLANT
LINER MARATHON 4 NEUTRAL 36X56 (Hips) ×2 IMPLANT
MANIFOLD NEPTUNE II (INSTRUMENTS) ×2 IMPLANT
NDL SAFETY ECLIPSE 18X1.5 (NEEDLE) ×2 IMPLANT
NEEDLE HYPO 18GX1.5 SHARP (NEEDLE) ×2
NS IRRIG 1000ML POUR BTL (IV SOLUTION) ×2 IMPLANT
PACK TOTAL JOINT (CUSTOM PROCEDURE TRAY) ×2 IMPLANT
PADDING CAST COTTON 6X4 STRL (CAST SUPPLIES) ×2 IMPLANT
PASSER SUT SWANSON 36MM LOOP (INSTRUMENTS) ×2 IMPLANT
PINNACLE SECTOR CUP 56MM (Hips) ×2 IMPLANT
POSITIONER SURGICAL ARM (MISCELLANEOUS) ×2 IMPLANT
SOLUTION SM 8 16.5 (Hips) ×2 IMPLANT
SPONGE LAP 18X18 X RAY DECT (DISPOSABLE) ×2 IMPLANT
SROM M HEAD 36MM 2 (Hips) ×2 IMPLANT
STRIP CLOSURE SKIN 1/2X4 (GAUZE/BANDAGES/DRESSINGS) ×4 IMPLANT
SUT ETHIBOND NAB CT1 #1 30IN (SUTURE) ×4 IMPLANT
SUT MNCRL AB 4-0 PS2 18 (SUTURE) ×2 IMPLANT
SUT VIC AB 1 CT1 27 (SUTURE) ×3
SUT VIC AB 1 CT1 27XBRD ANTBC (SUTURE) ×3 IMPLANT
SUT VIC AB 2-0 CT1 27 (SUTURE) ×4
SUT VIC AB 2-0 CT1 TAPERPNT 27 (SUTURE) ×4 IMPLANT
SUT VLOC 180 0 24IN GS25 (SUTURE) ×4 IMPLANT
SYR 50ML LL SCALE MARK (SYRINGE) ×2 IMPLANT
TOWEL OR 17X26 10 PK STRL BLUE (TOWEL DISPOSABLE) ×4 IMPLANT
TOWEL OR NON WOVEN STRL DISP B (DISPOSABLE) ×2 IMPLANT
TRAY FOLEY CATH 14FRSI W/METER (CATHETERS) IMPLANT
TRAY FOLEY CATH 16FR SILVER (SET/KITS/TRAYS/PACK) ×2 IMPLANT
WATER STERILE IRR 1500ML POUR (IV SOLUTION) ×2 IMPLANT

## 2014-03-19 NOTE — Transfer of Care (Signed)
Immediate Anesthesia Transfer of Care Note  Patient: Joseph [name]  Procedure(s) Performed: Procedure(s): CONVERSION OF PREVIOUS HIP SURGERY TO LEFT TOTAL HIP ARTHROPLASTY (Left)  Patient Location: PACU  Anesthesia Type:General  Level of Consciousness: awake, alert  and oriented  Airway & Oxygen Therapy: Patient Spontanous Breathing and Patient connected to face mask oxygen  Post-op Assessment: Report given to RN and Post -op Vital signs reviewed and stable  Post vital signs: Reviewed and stable  Last Vitals:  Filed Vitals:   03/19/14 1300  BP: 160/80  Pulse: 63  Temp: 37 C  Resp: 18    Complications: No apparent anesthesia complications

## 2014-03-19 NOTE — Anesthesia Procedure Notes (Signed)
Procedure Name: Intubation Date/Time: 03/19/2014 3:53 PM Performed by: Maxwell Caul Pre-anesthesia Checklist: Patient identified, Emergency Drugs available, Suction available and Patient being monitored Patient Re-evaluated:Patient Re-evaluated prior to inductionOxygen Delivery Method: Circle System Utilized Preoxygenation: Pre-oxygenation with 100% oxygen Intubation Type: IV induction Ventilation: Mask ventilation without difficulty Laryngoscope Size: Mac and 4 Grade View: Grade I Tube type: Oral Tube size: 7.5 mm Number of attempts: 1 Airway Equipment and Method: Stylet and Oral airway Placement Confirmation: ETT inserted through vocal cords under direct vision,  positive ETCO2 and breath sounds checked- equal and bilateral Secured at: 21 cm Tube secured with: Tape Dental Injury: Teeth and Oropharynx as per pre-operative assessment

## 2014-03-19 NOTE — H&P (View-Only) (Signed)
Yashua L. Stephanie Coup DOB: 1931-10-24 Widowed / Language: English / Race: White Male Date of Admission:  03/19/2014 CC: Left hip pain History of Present Illness The patient is a 81 year old male who comes in for a preoperative History and Physical. The patient is scheduled for a left conversion of previous hip surgery to left total hip to be performed by Dr. Dione Plover. Aluisio, MD at [facility] on 03-19-2014. The patient is a 81 year old male presenting for a post-operative visit. The patient comes in about 1 year out from left hardware exchange of the left hip (with subtroch femur FX 01/17/12).  The pain is under fair control at this time and describes their pain as mild (but more painful x2 months and also c/o buckling). They are currently on no medication for their pain. He still has considerable pain in that left hip. It is mainly activity-related. He does not have that lateral discomfort any more. Most of it is anterior and towards the groin. It is weight bearing and is associated with increased activities. He is not having pain at rest. It is limiting what he can and cannot do. He does not have the intense pain that he had last year but does have pain that it is bad enough where it is limiting him.  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 The hardware remains intact but it looks like the nail is either bent or is potentially broken at the top which would be consistent with a nonunion of the fracture. He is now ready to get the hip fixed at this time. They have been treated conservatively in the past for the above stated problem and despite conservative measures, they continue to have progressive pain and severe functional limitations and dysfunction. They have failed non-operative management including home exercise, medications. It is felt that they would benefit from undergoing total joint replacement. Risks and benefits of  the procedure have been discussed with the patient and they elect to proceed with surgery. There are no active contraindications to surgery such as ongoing infection or rapidly progressive neurological disease.  Problem List/Past Medical Bursitis, hip (M70.70) Trigger point of shoulder region (M25.519) Encounter for aftercare for healing closed traumatic fracture of left femur (S72.302D) Prostate Cancer Hypertension Hiatal Hernia Gastroesophageal Reflux Disease Kidney Stone  Allergies  No Known Drug Allergies  Family History  No pertinent family history  Social History  Living situation live alone Marital status widowed Drug/Alcohol Rehab (Previously) no Alcohol use former drinker Children 3 Exercise Exercises rarely Illicit drug use no Tobacco use Never smoker. never smoker Current work status working full time Drug/Alcohol Rehab (Currently) no Number of flights of stairs before winded 1 Pain Contract no Post-Surgical Plans Plan for home but might need SNF depending upon progress with therapy following the hip procedure. Advance Directives Living Will, Healthcare POA  Medication History  Metoprolol Tartrate (100MG  Tablet, Oral) Active. Pantoprazole Sodium (40MG  Tablet DR, Oral) Active. Loratadine (10MG  Tablet, Oral) Active. Lisinopril (20MG  Tablet, Oral) Active.  Past Surgical History ORIF LEFT HIP Total Knee Replacement bilateral  Review of Systems General Not Present- Chills, Fatigue, Fever, Memory Loss, Night Sweats, Weight Gain and Weight Loss. Skin Not Present- Eczema, Hives, Itching, Lesions and Rash. HEENT Not Present- Dentures, Double Vision, Headache, Hearing Loss, Tinnitus and Visual Loss. Respiratory Not Present- Allergies, Chronic Cough, Coughing up blood, Shortness of breath at rest and Shortness of breath with exertion. Cardiovascular  Not Present- Chest Pain, Difficulty Breathing Lying Down, Murmur, Palpitations,  Racing/skipping heartbeats and Swelling. Gastrointestinal Not Present- Abdominal Pain, Bloody Stool, Constipation, Diarrhea, Difficulty Swallowing, Heartburn, Jaundice, Loss of appetitie, Nausea and Vomiting. Male Genitourinary Not Present- Blood in Urine, Discharge, Flank Pain, Incontinence, Painful Urination, Urgency, Urinary frequency, Urinary Retention, Urinating at Night and Weak urinary stream. Musculoskeletal Present- Joint Pain. Not Present- Back Pain, Joint Swelling, Morning Stiffness, Muscle Pain, Muscle Weakness and Spasms. Neurological Not Present- Blackout spells, Difficulty with balance, Dizziness, Paralysis, Tremor and Weakness. Psychiatric Not Present- Insomnia.  Vitals Weight: 191.25 lb Height: 70.5in Body Surface Area: 2.06 m Body Mass Index: 27.05 kg/m  Pulse: 64 (Regular)  BP: 146/86 (Sitting, Left Arm, Standard)  Physical Exam General Mental Status -Alert, cooperative and good historian. General Appearance-pleasant, Not in acute distress. Orientation-Oriented X3. Build & Nutrition-Well nourished and Well developed.  Head and Neck Head-normocephalic, atraumatic . Neck Global Assessment - supple, no bruit auscultated on the right, no bruit auscultated on the left. Note: partial upper denture plate   Eye Pupil - Bilateral-Regular and Round. Motion - Bilateral-EOMI.  Chest and Lung Exam Auscultation Breath sounds - clear at anterior chest wall and clear at posterior chest wall. Adventitious sounds - No Adventitious sounds.  Cardiovascular Auscultation Rhythm - Regular rate and rhythm. Heart Sounds - S1 WNL and S2 WNL. Murmurs & Other Heart Sounds - Auscultation of the heart reveals - No Murmurs.  Abdomen Palpation/Percussion Tenderness - Abdomen is non-tender to palpation. Rigidity (guarding) - Abdomen is soft. Auscultation Auscultation of the abdomen reveals - Bowel sounds normal.  Male Genitourinary Note: Not done, not  pertinent to present illness   Musculoskeletal Note: On examination, he is alert and oriented in no apparent distress. He is walking with a significantly antalgic gait. His left hip can be flexed to 110, rotate in 20, out 30 and abduct 30 without discomfort.  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 The hardware remains intact. It looks like the partial portion of the nail is either bent or is potentially broken.   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  PCP: Dr. Coral Ceo  Topical TXA - Prostate Cancer  Anesthesia Issues: None  Signed electronically by Alexzandrew Monika Salk, III PA-C

## 2014-03-19 NOTE — Anesthesia Postprocedure Evaluation (Signed)
  Anesthesia Post-op Note  Patient: Joseph [name]  Procedure(s) Performed: Procedure(s) (LRB): CONVERSION OF PREVIOUS HIP SURGERY TO LEFT TOTAL HIP ARTHROPLASTY (Left)  Patient Location: PACU  Anesthesia Type: General  Level of Consciousness: awake and alert   Airway and Oxygen Therapy: Patient Spontanous Breathing  Post-op Pain: mild  Post-op Assessment: Post-op Vital signs reviewed, Patient's Cardiovascular Status Stable, Respiratory Function Stable, Patent Airway and No signs of Nausea or vomiting  Last Vitals:  Filed Vitals:   03/19/14 1300  BP: 160/80  Pulse: 63  Temp: 37 C  Resp: 18    Post-op Vital Signs: stable   Complications: No apparent anesthesia complications

## 2014-03-19 NOTE — Anesthesia Preprocedure Evaluation (Signed)
Anesthesia Evaluation  Patient identified by MRN, date of birth, ID band Patient awake    Reviewed: Allergy & Precautions, H&P , NPO status , Patient's Chart, lab work & pertinent test results, reviewed documented beta blocker date and time   Airway Mallampati: II  TM Distance: >3 FB Neck ROM: full    Dental  (+) Missing, Dental Advisory Given All right upper lateral and all left side upper missing.  Many lower lateral teeth missing too.:   Pulmonary neg pulmonary ROS,  breath sounds clear to auscultation  Pulmonary exam normal       Cardiovascular Exercise Tolerance: Good hypertension, Pt. on medications and Pt. on home beta blockers Rhythm:regular Rate:Normal     Neuro/Psych negative neurological ROS  negative psych ROS   GI/Hepatic negative GI ROS, Neg liver ROS, GERD-  Medicated and Controlled,[place]   Endo/Other  diabetes, Well Controlled, Type 2Diet controlled DM  Renal/GU negative Renal ROS  negative genitourinary   Musculoskeletal   Abdominal   Peds  Hematology negative hematology ROS (+)   Anesthesia Other Findings   Reproductive/Obstetrics negative OB ROS                             Anesthesia Physical Anesthesia Plan  ASA: III  Anesthesia Plan: General   Post-op Pain Management:    Induction: Intravenous  Airway Management Planned: Oral ETT  Additional Equipment:   Intra-op Plan:   Post-operative Plan: Extubation in OR  Informed Consent: I have reviewed the patients History and Physical, chart, labs and discussed the procedure including the risks, benefits and alternatives for the proposed anesthesia with the patient or authorized representative who has indicated his/her understanding and acceptance.   Dental Advisory Given  Plan Discussed with: CRNA and Surgeon  Anesthesia Plan Comments:         Anesthesia Quick Evaluation

## 2014-03-19 NOTE — Brief Op Note (Signed)
03/19/2014  6:37 PM  PATIENT:  Joseph [name]  81 y.o. male  PRE-OPERATIVE DIAGNOSIS:  non union left proximal femur fracture   POST-OPERATIVE DIAGNOSIS:  non union left proximal femur fracture   PROCEDURE:  Procedure(s): CONVERSION OF PREVIOUS HIP SURGERY TO LEFT TOTAL HIP ARTHROPLASTY (Left)  SURGEON:  Surgeon(s) and Role:    * Gearlean Alf, MD - Primary  PHYSICIAN ASSISTANT:   ASSISTANTS: Arlee Muslim, PA-C   ANESTHESIA:   general  EBL:  Total I/O In: 2000 [I.V.:2000] Out: 750 [Blood:750]  BLOOD ADMINISTERED:none  DRAINS: (Medium) Hemovact drain(s) in the left hip with  Suction Open   LOCAL MEDICATIONS USED:  OTHER Exparel  COUNTS:  YES  TOURNIQUET:  * No tourniquets in log *  DICTATION: .Other Dictation: Dictation Number [id number]  PLAN OF CARE: Admit to inpatient   PATIENT DISPOSITION:  PACU - hemodynamically stable.

## 2014-03-19 NOTE — Interval H&P Note (Signed)
History and Physical Interval Note:  03/19/2014 3:06 PM  Joseph [name]  has presented today for surgery, with the diagnosis of non union left proximal femur fracture   The various methods of treatment have been discussed with the patient and family. After consideration of risks, benefits and other options for treatment, the patient has consented to  Procedure(s): CONVERSION OF PREVIOUS HIP SURGERY TO LEFT TOTAL HIP ARTHROPLASTY (Left) as a surgical intervention .  The patient's history has been reviewed, patient examined, no change in status, stable for surgery.  I have reviewed the patient's chart and labs.  Questions were answered to the patient's satisfaction.     Gearlean Alf

## 2014-03-20 ENCOUNTER — Encounter (HOSPITAL_COMMUNITY): Payer: Self-pay | Admitting: Orthopedic Surgery

## 2014-03-20 LAB — GLUCOSE, CAPILLARY
GLUCOSE-CAPILLARY: 109 mg/dL — AB (ref 70–99)
GLUCOSE-CAPILLARY: 193 mg/dL — AB (ref 70–99)
Glucose-Capillary: 176 mg/dL — ABNORMAL HIGH (ref 70–99)
Glucose-Capillary: 179 mg/dL — ABNORMAL HIGH (ref 70–99)

## 2014-03-20 LAB — BASIC METABOLIC PANEL
Anion gap: 9 (ref 5–15)
BUN: 21 mg/dL (ref 6–23)
CO2: 23 mmol/L (ref 19–32)
Calcium: 8.2 mg/dL — ABNORMAL LOW (ref 8.4–10.5)
Chloride: 106 mmol/L (ref 96–112)
Creatinine, Ser: 1.22 mg/dL (ref 0.50–1.35)
GFR calc Af Amer: 63 mL/min — ABNORMAL LOW (ref >90)
GFR, EST NON AFRICAN AMERICAN: 54 mL/min — AB (ref >90)
Glucose, Bld: 203 mg/dL — ABNORMAL HIGH (ref 70–99)
POTASSIUM: 5 mmol/L (ref 3.5–5.1)
SODIUM: 138 mmol/L (ref 135–145)

## 2014-03-20 LAB — CBC
HCT: 31.9 % — ABNORMAL LOW (ref 39.0–52.0)
HEMOGLOBIN: 10.4 g/dL — AB (ref 13.0–17.0)
MCH: 28.4 pg (ref 26.0–34.0)
MCHC: 32.6 g/dL (ref 30.0–36.0)
MCV: 87.2 fL (ref 78.0–100.0)
Platelets: 174 10*3/uL (ref 150–400)
RBC: 3.66 MIL/uL — AB (ref 4.22–5.81)
RDW: 13.2 % (ref 11.5–15.5)
WBC: 13 10*3/uL — AB (ref 4.0–10.5)

## 2014-03-20 MED ORDER — METHOCARBAMOL 500 MG PO TABS: 500.0000 mg | ORAL_TABLET | Freq: Four times a day (QID) | ORAL | Status: DC | PRN

## 2014-03-20 MED ORDER — HYDROCODONE-ACETAMINOPHEN 5-325 MG PO TABS
1.0000 | ORAL_TABLET | ORAL | Status: DC | PRN
  Administered 2014-03-20: 1 via ORAL
  Administered 2014-03-21 – 2014-03-23 (×2): 2 via ORAL
  Filled 2014-03-20: qty 1
  Filled 2014-03-20: qty 2
  Filled 2014-03-20: qty 1
  Filled 2014-03-20: qty 2
  Filled 2014-03-20: qty 1

## 2014-03-20 MED ORDER — TRAMADOL HCL 50 MG PO TABS: 50.0000 mg | ORAL_TABLET | Freq: Four times a day (QID) | ORAL | Status: DC | PRN

## 2014-03-20 MED ORDER — RIVAROXABAN 10 MG PO TABS: 10.0000 mg | ORAL_TABLET | Freq: Every day | ORAL | Status: DC

## 2014-03-20 MED ORDER — OXYCODONE HCL 5 MG PO TABS: 5.0000 mg | ORAL_TABLET | ORAL | Status: DC | PRN

## 2014-03-20 NOTE — Progress Notes (Signed)
OT Cancellation Note  Patient Details Name: Joseph [name] MRN: [id number] DOB: 09-10-1931   Cancelled Treatment:    Reason Eval/Treat Not Completed: Other (comment).  Pt was nauseous and had limited session with PT this am.  Will check later or tomorrow.  [name] 03/20/2014, 12:51 PM  Lesle Chris, OTR/L [phone] 03/20/2014

## 2014-03-20 NOTE — Op Note (Signed)
NAMEJASPER, HANF              ACCOUNT NO.:  [id number]  MEDICAL RECORD NO.:  [id number]  LOCATION:  29                         FACILITY:  [facility]  PHYSICIAN:  Gaynelle Arabian, M.D.    DATE OF BIRTH:  [birth year]  DATE OF PROCEDURE:  03/19/2014 DATE OF DISCHARGE:                              OPERATIVE REPORT   PREOPERATIVE DIAGNOSIS:  Nonunion left proximal femur fracture.  POSTOPERATIVE DIAGNOSIS:  Nonunion left proximal femur fracture.  PROCEDURE:  Conversion of previous hip surgery to left total hip arthroplasty.  SURGEON:  Gaynelle Arabian, M.D.  ASSISTANT:  Alexzandrew L. Dara Lords, P.A-C.  ANESTHESIA:  General.  ESTIMATED BLOOD LOSS:  750.  DRAINS:  Hemovac x1.  COMPLICATIONS:  None.  CONDITION:  Stable to recovery.  BRIEF CLINICAL NOTE:  Mr. Dantonio is an 81 year old male, who had a left proximal femur fracture approximately 2 years ago.  He had done very well initially, then had pain related to the hardware.  We did an exchange of the proximal lag screw and I got him better for about 6-8 months.  He has had increasing groin pain, increasing thigh pain.  It appears that an area of the fracture still has not healed.  Due to the increasing pain and development of some degenerative changes in the hip, he presents now for hardware removal and conversion to a total hip arthroplasty.  PROCEDURE IN DETAIL:  After successful administration of general anesthetic, the patient was placed in the right lateral decubitus position with the left side up and held with the hip positioner.  The left lower extremity was isolated from his perineum with plastic drapes and prepped and draped in the usual sterile fashion.  Posterolateral incision was made with a 10 blade through subcutaneous tissue to the level of fascia lata, which was incised in line with the skin incision. The sciatic nerve was palpated and protected and then short rotators and capsule were isolated off the femur.  I  dislocated the hip, and then reduced it again.  I then identified the lag screw on the lateral aspect of the femur.  I had to remove some bone that had overgrown part of it. I removed the lag screw.  We then went distally and made a small incision at the previous site for the distal interlock screw.  I dissected down to the lateral cortex of the femur and found the screw. We are able to remove that screw without difficulty.  We then thoroughly irrigated that distal incision, closed subcu with interrupted 2-0 Vicryl, and skin with staples.  We again addressed the hip.  I then dislocated the hip and placed retractors around the femoral neck subsequently using an oscillating saw to make the osteotomy of the femoral neck.  The femoral head and neck were removed.  The nail was out more lateral in the greater trochanter. I exposed the nail.  We put an extraction device, and upon extracting, it was noted that there was only a small proximal piece that came out consistent with the nail being broken at the hole, where the lag screw was coming through.  We could not get access to that.  I decided to do a trochanteric  osteotomy in order to get better access to the IM nail.  We subsequently outlined the osteotomy and made the osteotomy with oscillating saw.  The trochanter was retracted superiorly.  I was able to then gain excellent access to the nail.  I had to remove some bone around the edge of the nail and once I did that, I was then able to use an extraction device to remove the IM nail.  We then thoroughly irrigated the femoral canal.  I reamed in the femoral canal for the solution stem.  Uses a straight reamer, starting at 13 and coursing up to 16.5 for line to line reaming for a 16.5 stem.  We broached starting at 13 small stature and going up to 16.5.  I was happy with the position of the 16.5 and the stability.  We put the femoral neck on and with a 36+ 1.5 head reduced it and there was  outstanding stability with full extension, full external rotation, 70 degrees flexion, 40 degrees adduction, 90 degrees internal rotation, 90 degrees of flexion, and 70 degrees of internal rotation.  By placing the left leg on top of the right, leg lengths were equal.  The hip was then dislocated and the trial was removed.  The 8 inch straight solution stem 16.5, with a small stature proximal is then impacted in the femur into the same depth as the trial was.  The 36-, 4 head was utilized because the +1, 5 head will be used to trial actually in length in him slightly. We thus used the -4 head trial and knee with equal leg lengths.  We then placed the permanent cobalt chrome 36-, 4 head.  The hip was reduced with same stability parameters and the leg lengths were equal.  We then reduced the greater trochanter.  I had to remove some of the bone from the inner aspect of trochanter, so we would rest on the stem correctly.  This led to excellent reduction of the trochanter.  I used a Zimmer 4 hole trochanteric plate, placing 2 cables proximal and through the collar of the stem and 2 cables distal onto the shaft of the femur. We sequentially tightened these and had a very stable reduction of the trochanteric fragment.  Please note that prior to addressing the femur, we had placed the acetabular shell.  Once we had done our femoral neck osteotomy, we retracted the femur anteriorly to gain acetabular exposure.  Labrum and osteophytes removed and we began reaming at 47 mm, coursing up to 55 mm for placement of a 56 mm Pinnacle acetabular shell.  It was placed in an anatomic position with excellent purchase and then a 36 mm neutral +4 marathon liner was placed.  Note that this was done prior to preparation of the femur.  Once the trochanteric plate was completed and the hip was reduced with the same stability parameters.  The wound was copiously irrigated with saline solution and the short  rotators were reattached to the femur with Ethibond sutures.  The fascia lata was closed over Hemovac drain with a running #1 V-Loc suture.  2 g of tranexamic acid mixed with 50 mL of saline.  It was then injected deep into the wound bed.  The subcu was closed with interrupted 2-0 Vicryl and skin closed with staples. Incisions cleaned and dried and a bulky sterile dressing applied.  The drain was hooked to suction and he was placed into knee immobilizer, awakened and transported to recovery in stable  condition.  Note that a surgical assistance was a medical necessity for this procedure to do it in a safe and expeditious manner.  Assistance was necessary for retraction of vital neurovascular structures and the proper positioning of the limb to allow for safe extraction of the old implant and safe and accurate placement of the new implants.     Gaynelle Arabian, M.D.     FA/MEDQ  D:  03/19/2014  T:  03/20/2014  Job:  [id number]

## 2014-03-20 NOTE — Progress Notes (Signed)
   Subjective: 1 Day Post-Op Procedure(s) (LRB): CONVERSION OF PREVIOUS HIP SURGERY TO LEFT TOTAL HIP ARTHROPLASTY (Left) Patient reports pain as mild.   Patient seen in rounds with Dr. Wynelle Link. Family in room. He looks very good this morning considering the extent of surgery the patient underwent yesterday. Patient is well, and has had no acute complaints or problems except of one bout of nausea yesterday. We will start therapy today but leave HV drain on today. He wants to go home so we will order a hospital bed for home. Plan is to go Home after hospital stay.  Objective: Vital signs in last 24 hours: Temp:  [97.6 F (36.4 C)-98.8 F (37.1 C)] 98.3 F (36.8 C) (02/19 0450) Pulse Rate:  [63-98] 98 (02/19 0829) Resp:  [9-20] 20 (02/19 0450) BP: (117-163)/(66-91) 138/78 mmHg (02/19 0829) SpO2:  [90 %-100 %] 97 % (02/19 0829) Weight:  [86.637 kg (191 lb)] 86.637 kg (191 lb) (02/18 1311)  Intake/Output from previous day:  Intake/Output Summary (Last 24 hours) at 03/20/14 0835 Last data filed at 03/20/14 0700  Gross per 24 hour  Intake   4445 ml  Output   1915 ml  Net   2530 ml    Intake/Output this shift: UOP 425 since MN  Labs:  Recent Labs  03/20/14 0430  HGB 10.4*    Recent Labs  03/20/14 0430  WBC 13.0*  RBC 3.66*  HCT 31.9*  PLT 174    Recent Labs  03/20/14 0430  NA 138  K 5.0  CL 106  CO2 23  BUN 21  CREATININE 1.22  GLUCOSE 203*  CALCIUM 8.2*   No results for input(s): LABPT, INR in the last 72 hours.  EXAM General - Patient is Alert, Appropriate and Oriented Extremity - Neurovascular intact Sensation intact distally Dorsiflexion/Plantar flexion intact Dressing - dressing C/D/I Motor Function - intact, moving foot and toes well on exam.  Hemovac pulled without difficulty.  Past Medical History  Diagnosis Date  . Hypertension   . Seasonal allergies   . History of kidney stones   . GERD (gastroesophageal reflux disease)   .  Arthritis   . History of nonunion of fracture     LEFT PROXIMAL FEMUR  . Diabetes mellitus without complication     DIET CONTROLLED  . Cancer     HX PROSTATE CANCER - MANY YRS AGO - ? RADIATION [place]    Assessment/Plan: 1 Day Post-Op Procedure(s) (LRB): CONVERSION OF PREVIOUS HIP SURGERY TO LEFT TOTAL HIP ARTHROPLASTY (Left) Active Problems:   Fracture, proximal femur  Estimated body mass index is 25.2 kg/(m^2) as calculated from the following:   Height as of this encounter: 6\' 1"  (1.854 m).   Weight as of this encounter: 86.637 kg (191 lb). Advance diet Up with therapy Discharge home with home health, possibly over the weekend with family if he does well  DVT Prophylaxis - Xarelto Weight Bearing As Tolerated left Leg D/C Knee Immobilizer Hemovac left in place today. Will ask the weekend coverage staff to pull drain tomorrow with dressing change. [facility] for home.  Arlee Muslim, PA-C Orthopaedic Surgery 03/20/2014, 8:35 AM

## 2014-03-20 NOTE — Discharge Instructions (Addendum)
Dr. Gaynelle Arabian Total Joint Specialist [facility] [address] [place], [place] [address] [phone]   TOTAL HIP REPLACEMENT POSTOPERATIVE DIRECTIONS    Hip Rehabilitation, Guidelines Following Surgery  The results of a hip operation are greatly improved after range of motion and muscle strengthening exercises. Follow all safety measures which are given to protect your hip. If any of these exercises cause increased pain or swelling in your joint, decrease the amount until you are comfortable again. Then slowly increase the exercises. Call your caregiver if you have problems or questions.  HOME CARE INSTRUCTIONS  Most of the following instructions are designed to prevent the dislocation of your new hip.  Remove items at home which could result in a fall. This includes throw rugs or furniture in walking pathways.  Continue medications as instructed at time of discharge.  You may have some home medications which will be placed on hold until you complete the course of blood thinner medication.  You may start showering once you are discharged home but do not submerge the incision under water. Just pat the incision dry and apply a dry gauze dressing on daily. Do not put on socks or shoes without following the instructions of your caregivers.  Sit on high chairs so your hips are not bent more than 90 degrees.  Sit on chairs with arms. Use the chair arms to help push yourself up when arising.  Keep your leg on the side of the operation out in front of you when standing up.  Arrange for the use of a toilet seat elevator so you are not sitting low.  Do not do any exercises or get in any positions that cause your toes to point in (pigeon toed).  Always sleep with a pillow between your legs. Do not lie on your side in sleep with both knees touching the bed.   Walk with walker as instructed.  You may resume a sexual relationship in one month or when given the OK by  your caregiver.  Use walker as long as suggested by your caregivers.  You may put full weight on your legs and walk as much as is comfortable. Avoid periods of inactivity such as sitting longer than an hour when not asleep. This helps prevent blood clots.  You may return to work once you are cleared by Engineer, production.  Do not drive a car for 6 weeks or until released by your surgeon.  Do not drive while taking narcotics.  Wear elastic stockings for three weeks following surgery during the day but you may remove then at night.  Make sure you keep all of your appointments after your operation with all of your doctors and caregivers. You should call the office at the above phone number and make an appointment for approximately two weeks after the date of your surgery. Change the dressing daily and reapply a dry dressing each time. Please pick up a stool softener and laxative for home use as long as you are requiring pain medications.  ICE to the affected hip every three hours for 30 minutes at a time and then as needed for pain and swelling. Continue to use ice on the hip for pain and swelling from surgery. You may notice swelling that will progress down to the foot and ankle.  This is normal after  surgery.  Elevate the leg when you are not up walking on it.   It is important for you to complete the blood thinner  medication as prescribed by your doctor.  Continue to use the breathing machine which will help keep your temperature down.  It is common for your temperature to cycle up and down following surgery, especially at night when you are not up moving around and exerting yourself.  The breathing machine keeps your lungs expanded and your temperature down.  RANGE OF MOTION AND STRENGTHENING EXERCISES  These exercises are designed to help you keep full movement of your hip joint. Follow your caregiver's or physical therapist's instructions. Perform all exercises about fifteen times, three times per  day or as directed. Exercise both hips, even if you have had only one joint replacement. These exercises can be done on a training (exercise) mat, on the floor, on a table or on a bed. Use whatever works the best and is most comfortable for you. Use music or television while you are exercising so that the exercises are a pleasant break in your day. This will make your life better with the exercises acting as a break in routine you can look forward to.  Lying on your back, slowly slide your foot toward your buttocks, raising your knee up off the floor. Then slowly slide your foot back down until your leg is straight again.  Lying on your back spread your legs as far apart as you can without causing discomfort.  Lying on your side, raise your upper leg and foot straight up from the floor as far as is comfortable. Slowly lower the leg and repeat.  Lying on your back, tighten up the muscle in the front of your thigh (quadriceps muscles). You can do this by keeping your leg straight and trying to raise your heel off the floor. This helps strengthen the largest muscle supporting your knee.  Lying on your back, tighten up the muscles of your buttocks both with the legs straight and with the knee bent at a comfortable angle while keeping your heel on the floor.   SKILLED REHAB INSTRUCTIONS: If the patient is transferred to a skilled rehab facility following release from the hospital, a list of the current medications will be sent to the facility for the patient to continue.  When discharged from the skilled rehab facility, please have the facility set up the patient's [place] prior to being released. Also, the skilled facility will be responsible for providing the patient with their medications at time of release from the facility to include their pain medication, the muscle relaxants, and their blood thinner medication. If the patient is still at the rehab facility at time of the two week  follow up appointment, the skilled rehab facility will also need to assist the patient in arranging follow up appointment in our office and any transportation needs.  MAKE SURE YOU:  Understand these instructions.  Will watch your condition.  Will get help right away if you are not doing well or get worse.  Pick up stool softner and laxative for home use following surgery while on pain medications. Do not submerge incision under water. Please use good hand washing techniques while changing dressing each day. May shower starting three days after surgery. Please use a clean towel to pat the incision dry following showers. Continue to use ice for pain and swelling after surgery. Do not use any lotions or creams on the incision until instructed by your surgeon. Hip precautions.  Total Hip Protocol.  Take Xarelto for two and a half more weeks, then discontinue Xarelto. Once the  patient has completed the Xarelto, they may resume the 81 mg Aspirin.  Postoperative Constipation Protocol  Constipation - defined medically as fewer than three stools per week and severe constipation as less than one stool per week.  One of the most common issues patients have following surgery is constipation.  Even if you have a regular bowel pattern at home, your normal regimen is likely to be disrupted due to multiple reasons following surgery.  Combination of anesthesia, postoperative narcotics, change in appetite and fluid intake all can affect your bowels.  In order to avoid complications following surgery, here are some recommendations in order to help you during your recovery period.  Colace (docusate) - Pick up an over-the-counter form of Colace or another stool softener and take twice a day as long as you are requiring postoperative pain medications.  Take with a full glass of water daily.  If you experience loose stools or diarrhea, hold the colace until you stool forms back up.  If your symptoms do not get  better within 1 week or if they get worse, check with your doctor.  Dulcolax (bisacodyl) - Pick up over-the-counter and take as directed by the product packaging as needed to assist with the movement of your bowels.  Take with a full glass of water.  Use this product as needed if not relieved by Colace only.   MiraLax (polyethylene glycol) - Pick up over-the-counter to have on hand.  MiraLax is a solution that will increase the amount of water in your bowels to assist with bowel movements.  Take as directed and can mix with a glass of water, juice, soda, coffee, or tea.  Take if you go more than two days without a movement. Do not use MiraLax more than once per day. Call your doctor if you are still constipated or irregular after using this medication for 7 days in a row.  If you continue to have problems with postoperative constipation, please contact the office for further assistance and recommendations.  If you experience "the worst abdominal pain ever" or develop nausea or vomiting, please contact the office immediatly for further recommendations for treatment.    Information on my medicine - XARELTO (Rivaroxaban)  This medication education was reviewed with me or my healthcare representative as part of my discharge preparation.  The pharmacist that spoke with me during my hospital stay was:  [facility]  Why was Xarelto prescribed for you? Xarelto was prescribed for you to reduce the risk of blood clots forming after orthopedic surgery. The medical term for these abnormal blood clots is venous thromboembolism (VTE).  What do you need to know about xarelto ? Take your Xarelto ONCE DAILY at the same time every day. You may take it either with or without food.  If you have difficulty swallowing the tablet whole, you may crush it and mix in applesauce just prior to taking your dose.  Take Xarelto exactly as prescribed by your doctor and DO NOT stop taking Xarelto without  talking to the doctor who prescribed the medication.  Stopping without other VTE prevention medication to take the place of Xarelto may increase your risk of developing a clot.  After discharge, you should have regular check-up appointments with your healthcare provider that is prescribing your Xarelto.    What do you do if you miss a dose? If you miss a dose, take it as soon as you remember on the same day then continue your regularly scheduled once  daily regimen the next day. Do not take two doses of Xarelto on the same day.   Important Safety Information A possible side effect of Xarelto is bleeding. You should call your healthcare provider right away if you experience any of the following: ? Bleeding from an injury or your nose that does not stop. ? Unusual colored urine (red or dark brown) or unusual colored stools (red or black). ? Unusual bruising for unknown reasons. ? A serious fall or if you hit your head (even if there is no bleeding).  Some medicines may interact with Xarelto and might increase your risk of bleeding while on Xarelto. To help avoid this, consult your healthcare provider or pharmacist prior to using any new prescription or non-prescription medications, including herbals, vitamins, non-steroidal anti-inflammatory drugs (NSAIDs) and supplements.  This website has more information on Xarelto: [email or web address]

## 2014-03-20 NOTE — Care Management Note (Signed)
    Page 1 of 2   03/20/2014     12:21:58 PM CARE MANAGEMENT NOTE 03/20/2014  Patient:  Joseph [name], Joseph [name]   Account Number:  [id number]  Date Initiated:  03/20/2014  Documentation initiated by:  [name]  Subjective/Objective Assessment:   81 yo male admitted s/p hip surgery. PTA lived at home.     Action/Plan:   Home when stable, daughter to assist with care   Anticipated DC Date:  03/22/2014   Anticipated DC Plan:  [place]  CM consult      [facility] Choice  HOME HEALTH   Choice offered to / List presented to:  [name]   DME arranged  HOSPITAL BED      DME agency  [place]     [place] arranged  HH-2 PT      [place].   Status of service:  Completed, signed off Medicare Important Message given?   (If response is "NO", the following Medicare IM given date fields will be blank) Date Medicare IM given:   Medicare IM given by:   Date Additional Medicare IM given:   Additional Medicare IM given by:    Discharge Disposition:  [place]  Per UR Regulation:  Reviewed for med. necessity/level of care/duration of stay  If discussed at [place] of Stay Meetings, dates discussed:    Comments:  03-20-14 [place] 1100 Spoke with patient and daughter at bedside. States patient plans to d/c to home and daughter will provide care. Patient has hospital bed ordered, has all other DME needed. Awaiting PT to work with patient, has sat on side of bed but not been out of bed. Wants to use [facility] for [facility] services. Contacted [facility] to arrange, plan for d/c over weekend depending on progress.

## 2014-03-20 NOTE — Plan of Care (Signed)
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

## 2014-03-20 NOTE — Evaluation (Signed)
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  He will benefit from skilled OT to continue education on posterior THPs during ADLs and bathroom transfers.  Pt was mod I with ADLs prior to admission. Goals are for supervision to min guard level at this time.    Follow Up Recommendations  Home health OT    Equipment Recommendations  None recommended by OT    Recommendations for Other Services       Precautions / Restrictions Precautions Precautions: Posterior Hip;Fall Restrictions Other Position/Activity Restrictions: WBAT      Mobility Bed Mobility         Supine to sit: Min assist     General bed mobility comments: cues for sequence.  Assist for LLE  Transfers Overall transfer level: Needs assistance Equipment used: Rolling walker (2 wheeled) Transfers: Sit to/from Stand Sit to Stand: +2 safety/equipment;Min assist;From elevated surface         General transfer comment: assist to rise and steady    Balance                                            ADL Overall ADL's : Needs assistance/impaired     Grooming: Set up;Sitting   Upper Body Bathing: Set up;Sitting   Lower Body Bathing: Minimal assistance;Sit to/from stand   Upper Body Dressing : Set up;Sitting   Lower Body Dressing: Minimal assistance;Sit to/from stand                 General ADL Comments: Pt has AE from previous surgery.  Reviewed posterior THPs--he did not have these last surgery.  Daughter will be with him for a week, 24/7 then intermittently.  Pt cued for 90 degrees when using AE--he doesn't have a lot of extra room beyond 90 when using reacher to reach feet.     Vision     Perception     Praxis      Pertinent  Vitals/Pain Pain Score: 2  Pain Location: L hip Pain Descriptors / Indicators: Sore Pain Intervention(s): Limited activity within patient's tolerance;Monitored during session;Premedicated before session;Ice applied;Repositioned     Hand Dominance     Extremity/Trunk Assessment Upper Extremity Assessment Upper Extremity Assessment: Overall WFL for tasks assessed           Communication Communication Communication: HOH   Cognition Arousal/Alertness: Awake/alert Behavior During Therapy: WFL for tasks assessed/performed Overall Cognitive Status: Within Functional Limits for tasks assessed                     General Comments       Exercises       Shoulder Instructions      Home Living Family/patient expects to be discharged to:: Private residence Living Arrangements: Alone Available Help at Discharge: Family Type of Home: House             Bathroom Shower/Tub: Walk-in shower   Bathroom Toilet: Handicapped height     Home Equipment: Bedside commode;Shower seat          Prior Functioning/Environment Level of Independence: Independent;Independent with assistive device(s)  OT Diagnosis: Generalized weakness   OT Problem List: Decreased strength;Decreased activity tolerance;Decreased knowledge of use of DME or AE;Pain;Decreased knowledge of precautions   OT Treatment/Interventions: Self-care/ADL training;DME and/or AE instruction;Patient/family education    OT Goals(Current goals can be found in the care plan section) Acute Rehab OT Goals Patient Stated Goal: Resume previous lifestyle with decreased pain OT Goal Formulation: With patient Time For Goal Achievement: 03/27/14 Potential to Achieve Goals: Good ADL Goals Pt Will Transfer to Toilet: with min guard assist;ambulating;bedside commode Pt Will Perform Tub/Shower Transfer: Shower transfer;ambulating;rolling walker;shower seat;with min guard assist Additional ADL Goal #1: pt will  complete LB adls with AE and toilet management at supervision level, sit to stand  OT Frequency: Min 2X/week   Barriers to D/C:            Co-evaluation PT/OT/SLP Co-Evaluation/Treatment: Yes Reason for Co-Treatment: For patient/therapist safety PT goals addressed during session: Mobility/safety with mobility OT goals addressed during session: ADL's and self-care;Proper use of Adaptive equipment and DME      End of Session    Activity Tolerance: Patient tolerated treatment well Patient left: in chair;with call bell/phone within reach;with family/visitor present   Time: 0349-1791 OT Time Calculation (min): 30 min Charges:  OT General Charges $OT Visit: 1 Procedure OT Evaluation $Initial OT Evaluation Tier I: 1 Procedure G-Codes:    Humna Moorehouse 2014-04-07, 2:52 PM  Lesle Chris, OTR/L [phone] 04-07-14

## 2014-03-20 NOTE — Evaluation (Signed)
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  Pt should progress to d.c home with dtr and HHPT follow up.    Follow Up Recommendations Home health PT    Equipment Recommendations  None recommended by PT    Recommendations for Other Services OT consult     Precautions / Restrictions Precautions Precautions: Posterior Hip;Fall Restrictions Weight Bearing Restrictions: No Other Position/Activity Restrictions: WBAT      Mobility  Bed Mobility Overal bed mobility: Needs Assistance Bed Mobility: Supine to Sit;Sit to Supine     Supine to sit: Mod assist Sit to supine: Mod assist   General bed mobility comments: cues for sequence and use of R LE to self assist  Transfers                 General transfer comment: Deferred 2* pt increasing nausea at EOB  Ambulation/Gait                Stairs            Wheelchair Mobility    Modified Rankin (Stroke Patients Only)       Balance                                             Pertinent Vitals/Pain Pain Assessment: 0-10 Pain Score: 4  Pain Location: L hip Pain Descriptors / Indicators: Aching;Sore Pain Intervention(s): Limited activity within patient's tolerance;Monitored during session;Premedicated before session;Ice applied    Home Living Family/patient expects to be discharged to:: Private residence Living Arrangements: Alone Available Help at Discharge: Family Type of Home: House Home Access: Stairs to enter Entrance Stairs-Rails: None Entrance Stairs-Number of Steps: 1 Home Layout: One level Home Equipment: Environmental consultant - 2 wheels;Cane - single point      Prior Function Level of  Independence: Independent;Independent with assistive device(s)               Hand Dominance        Extremity/Trunk Assessment   Upper Extremity Assessment: Overall WFL for tasks assessed           Lower Extremity Assessment: LLE deficits/detail   LLE Deficits / Details: 2+/5 hip strength with AAROM at hip to 85 flex and 20 abd  Cervical / Trunk Assessment: Normal  Communication   Communication: HOH  Cognition Arousal/Alertness: Awake/alert Behavior During Therapy: WFL for tasks assessed/performed Overall Cognitive Status: Within Functional Limits for tasks assessed                      General Comments      Exercises Total Joint Exercises Ankle Circles/Pumps: AROM;Both;15 reps;Supine Quad Sets: AROM;Both;10 reps;Supine Heel Slides: AAROM;15 reps;Left;Supine Hip ABduction/ADduction: AAROM;Left;Supine;10 reps      Assessment/Plan    PT Assessment    PT Diagnosis Difficulty walking   PT Problem List    PT Treatment Interventions     PT Goals (Current goals can be found in the Care Plan section) Acute Rehab PT Goals Patient Stated Goal: Resume previous lifestyle with decreased pain PT Goal Formulation: With patient Time For Goal Achievement: 03/27/14  Potential to Achieve Goals: Good    Frequency     Barriers to discharge        Co-evaluation               End of Session   Activity Tolerance: Other (comment) (nausea) Patient left: in bed;with call bell/phone within reach Nurse Communication: Mobility status         Time: 0850-0920 PT Time Calculation (min) (ACUTE ONLY): 30 min   Charges:   PT Evaluation $Initial PT Evaluation Tier I: 1 Procedure PT Treatments $Therapeutic Exercise: 8-22 mins   PT G Codes:        Zanaya Baize 04-01-2014, 12:47 PM

## 2014-03-20 NOTE — Progress Notes (Signed)
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    PT Comments    Pt progressed to ambulation in hall with decreased nausea   Follow Up Recommendations  Home health PT     Equipment Recommendations  None recommended by PT    Recommendations for Other Services OT consult     Precautions / Restrictions Precautions Precautions: Posterior Hip;Fall Precaution Comments: All THP reviewed, sign hung in room Restrictions Weight Bearing Restrictions: No Other Position/Activity Restrictions: WBAT    Mobility  Bed Mobility Overal bed mobility: Needs Assistance Bed Mobility: Supine to Sit     Supine to sit: Min assist     General bed mobility comments: cues for sequence.  Assist for LLE  Transfers Overall transfer level: Needs assistance Equipment used: Rolling walker (2 wheeled) Transfers: Sit to/from Stand Sit to Stand: +2 safety/equipment;Min assist;From elevated surface         General transfer comment: assist to rise and steady  Ambulation/Gait Ambulation/Gait assistance: Min assist Ambulation Distance (Feet): 28 Feet Assistive device: Rolling walker (2 wheeled) Gait Pattern/deviations: Step-to pattern;Decreased step length - right;Decreased step length - left;Shuffle;Trunk flexed Gait velocity: decr   General Gait Details: cues for posture, sequence, position from RW and ER on L   Stairs            Wheelchair Mobility    Modified Rankin (Stroke Patients Only)       Balance                                    Cognition Arousal/Alertness: Awake/alert Behavior During Therapy: WFL for tasks assessed/performed Overall Cognitive Status: Within Functional Limits for tasks assessed                      Exercises      General Comments        Pertinent Vitals/Pain Pain  Assessment: 0-10 Pain Score: 2  Pain Location: L hip Pain Descriptors / Indicators: Sore Pain Intervention(s): Premedicated before session;Monitored during session;Limited activity within patient's tolerance;Ice applied    Home Living Family/patient expects to be discharged to:: Private residence Living Arrangements: Alone Available Help at Discharge: Family Type of Home: House       Home Equipment: Bedside commode;Shower seat      Prior Function Level of Independence: Independent;Independent with assistive device(s)          PT Goals (current goals can now be found in the care plan section) Acute Rehab PT Goals Patient Stated Goal: Resume previous lifestyle with decreased pain PT Goal Formulation: With patient Time For Goal Achievement: 03/27/14 Potential to Achieve Goals: Good Progress towards PT goals: Progressing toward goals    Frequency  7X/week    PT Plan Current plan remains appropriate    Co-evaluation PT/OT/SLP Co-Evaluation/Treatment: Yes Reason for Co-Treatment: For patient/therapist safety PT goals addressed during session: Mobility/safety with mobility OT goals addressed during session: ADL's and self-care     End of Session Equipment Utilized During Treatment: Gait belt Activity Tolerance: Patient tolerated treatment well;Patient limited by fatigue Patient left: in chair;with call bell/phone within reach;with family/visitor present     Time: 1410-1435 PT Time Calculation (min) (ACUTE ONLY): 25 min  Charges:  $Gait Training: 8-22 mins  G Codes:      Zariah Cavendish 2014/03/30, 4:57 PM

## 2014-03-20 NOTE — Progress Notes (Signed)
Utilization review completed.  

## 2014-03-21 LAB — BASIC METABOLIC PANEL
ANION GAP: 5 (ref 5–15)
BUN: 23 mg/dL (ref 6–23)
CHLORIDE: 105 mmol/L (ref 96–112)
CO2: 26 mmol/L (ref 19–32)
CREATININE: 1.11 mg/dL (ref 0.50–1.35)
Calcium: 8.1 mg/dL — ABNORMAL LOW (ref 8.4–10.5)
GFR calc non Af Amer: 61 mL/min — ABNORMAL LOW (ref >90)
GFR, EST AFRICAN AMERICAN: 70 mL/min — AB (ref >90)
Glucose, Bld: 148 mg/dL — ABNORMAL HIGH (ref 70–99)
Potassium: 3.9 mmol/L (ref 3.5–5.1)
Sodium: 136 mmol/L (ref 135–145)

## 2014-03-21 LAB — GLUCOSE, CAPILLARY
GLUCOSE-CAPILLARY: 115 mg/dL — AB (ref 70–99)
Glucose-Capillary: 123 mg/dL — ABNORMAL HIGH (ref 70–99)
Glucose-Capillary: 109 mg/dL — ABNORMAL HIGH (ref 70–99)

## 2014-03-21 LAB — CBC
HCT: 24.6 % — ABNORMAL LOW (ref 39.0–52.0)
Hemoglobin: 8.3 g/dL — ABNORMAL LOW (ref 13.0–17.0)
MCH: 29.2 pg (ref 26.0–34.0)
MCHC: 33.7 g/dL (ref 30.0–36.0)
MCV: 86.6 fL (ref 78.0–100.0)
Platelets: 149 10*3/uL — ABNORMAL LOW (ref 150–400)
RBC: 2.84 MIL/uL — AB (ref 4.22–5.81)
RDW: 13.6 % (ref 11.5–15.5)
WBC: 14.2 10*3/uL — ABNORMAL HIGH (ref 4.0–10.5)

## 2014-03-21 NOTE — Progress Notes (Signed)
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  Reviewed car transfers and use of pillows for support to allow side sleeping  Follow Up Recommendations  Home health PT     Equipment Recommendations  None recommended by PT    Recommendations for Other Services OT consult     Precautions / Restrictions Precautions Precautions: Posterior Hip;Fall Precaution Comments: All THP reviewed, sign hung in room Restrictions Weight Bearing Restrictions: No Other Position/Activity Restrictions: WBAT    Mobility  Bed Mobility Overal bed mobility: Needs Assistance Bed Mobility: Supine to Sit;Sit to Supine     Supine to sit: Min assist;Min guard Sit to supine: Min assist;Min guard   General bed mobility comments: cues for sequence.  Very min assist for LLE  Transfers Overall transfer level: Needs assistance Equipment used: Rolling walker (2 wheeled) Transfers: Sit to/from Stand Sit to Stand: Min guard         General transfer comment: cues for LE management and use of UEs to self assist  Ambulation/Gait Ambulation/Gait assistance: Min guard Ambulation Distance (Feet): 111 Feet Assistive device: Rolling walker (2 wheeled) Gait Pattern/deviations: Step-to pattern;Decreased step length - right;Decreased step length - left;Shuffle;Trunk flexed Gait velocity: decr   General Gait Details: cues for posture, sequence, position from RW and ER on L   Stairs            Wheelchair Mobility    Modified Rankin (Stroke Patients Only)       Balance                                    Cognition Arousal/Alertness: Awake/alert Behavior During Therapy: WFL for tasks assessed/performed Overall Cognitive Status: Within Functional Limits for tasks assessed                       Exercises      General Comments        Pertinent Vitals/Pain Pain Assessment: 0-10 Pain Score: 6  Pain Location: L hip Pain Descriptors / Indicators: Aching;Sore Pain Intervention(s): Limited activity within patient's tolerance;Monitored during session;Premedicated before session;Ice applied    Home Living                      Prior Function            PT Goals (current goals can now be found in the care plan section) Acute Rehab PT Goals Patient Stated Goal: Resume previous lifestyle with decreased pain PT Goal Formulation: With patient Time For Goal Achievement: 03/27/14 Potential to Achieve Goals: Good Progress towards PT goals: Progressing toward goals    Frequency  7X/week    PT Plan Current plan remains appropriate    Co-evaluation             End of Session Equipment Utilized During Treatment: Gait belt Activity Tolerance: Patient tolerated treatment well Patient left: in bed;with call bell/phone within reach;with family/visitor present     Time: 0277-4128 PT Time Calculation (min) (ACUTE ONLY): 24 min  Charges:  $Gait Training: 8-22 mins $Therapeutic Activity: 8-22 mins                    G Codes:  Bode Pieper 03/21/2014, 3:45 PM

## 2014-03-21 NOTE — Progress Notes (Signed)
     Subjective: 2 Days Post-Op Procedure(s) (LRB): CONVERSION OF PREVIOUS HIP SURGERY TO LEFT TOTAL HIP ARTHROPLASTY (Left)   Patient reports pain as mild, pain controlled. Pain is tolerating the Norco much better than the Oxycodone. Little nausea this morning, but he already is feeling better.  No events throughout the night.  Objective:   VITALS:   Filed Vitals:   03/21/14  BP: 146/60  Pulse: 98  Temp: 98.1 F (36.7 C)   Resp: 16    Dorsiflexion/Plantar flexion intact Incision: scant drainage No cellulitis present Compartment soft  LABS  Recent Labs  03/20/14 0430 03/21/14 0515  HGB 10.4* 8.3*  HCT 31.9* 24.6*  WBC 13.0* 14.2*  PLT 174 149*     Recent Labs  03/20/14 0430 03/21/14 0515  NA 138 136  K 5.0 3.9  BUN 21 23  CREATININE 1.22 1.11  GLUCOSE 203* 148*     Assessment/Plan: 2 Days Post-Op Procedure(s) (LRB): CONVERSION OF PREVIOUS HIP SURGERY TO LEFT TOTAL HIP ARTHROPLASTY (Left) HV drain d/c'ed Up with therapy Discharge home with home health eventually, when ready   [place] P[place]. Teven Mittman   PAC  03/21/2014, 9:31 AM

## 2014-03-21 NOTE — Progress Notes (Signed)
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    PT Comments    Good progress with mobility with no c/o dizziness or nausea this am  Follow Up Recommendations  Home health PT     Equipment Recommendations  None recommended by PT    Recommendations for Other Services OT consult     Precautions / Restrictions Precautions Precautions: Posterior Hip;Fall Precaution Comments: All THP reviewed, sign hung in room Restrictions Weight Bearing Restrictions: No Other Position/Activity Restrictions: WBAT    Mobility  Bed Mobility Overal bed mobility: Needs Assistance Bed Mobility: Supine to Sit     Supine to sit: Min assist     General bed mobility comments: cues for sequence.  Assist for LLE  Transfers Overall transfer level: Needs assistance Equipment used: Rolling walker (2 wheeled) Transfers: Sit to/from Stand Sit to Stand: Min assist         General transfer comment: cues for LE management and use of UEs to self assist  Ambulation/Gait Ambulation/Gait assistance: Min assist Ambulation Distance (Feet): 137 Feet Assistive device: Rolling walker (2 wheeled) Gait Pattern/deviations: Step-to pattern;Decreased step length - right;Decreased step length - left;Shuffle;Trunk flexed Gait velocity: decr   General Gait Details: cues for posture, sequence, position from RW and ER on L   Stairs            Wheelchair Mobility    Modified Rankin (Stroke Patients Only)       Balance                                    Cognition Arousal/Alertness: Awake/alert Behavior During Therapy: WFL for tasks assessed/performed Overall Cognitive Status: Within Functional Limits for tasks assessed                      Exercises Total Joint Exercises Ankle Circles/Pumps: AROM;Both;15  reps;Supine Quad Sets: AROM;Both;10 reps;Supine Gluteal Sets: AROM;Both;10 reps;Supine Heel Slides: AAROM;Left;Supine;20 reps Hip ABduction/ADduction: AAROM;Left;Supine;15 reps    General Comments        Pertinent Vitals/Pain Pain Assessment: No/denies pain Pain Score: 5  Pain Location: L hip Pain Descriptors / Indicators: Aching;Sore Pain Intervention(s): Limited activity within patient's tolerance;Monitored during session;Premedicated before session;Ice applied    Home Living                      Prior Function            PT Goals (current goals can now be found in the care plan section) Acute Rehab PT Goals Patient Stated Goal: Resume previous lifestyle with decreased pain PT Goal Formulation: With patient Time For Goal Achievement: 03/27/14 Potential to Achieve Goals: Good Progress towards PT goals: Progressing toward goals    Frequency  7X/week    PT Plan Current plan remains appropriate    Co-evaluation             End of Session Equipment Utilized During Treatment: Gait belt Activity Tolerance: Patient tolerated treatment well Patient left: Other (comment) (bathroom)     Time: 3500-9381 PT Time Calculation (min) (ACUTE ONLY): 26 min  Charges:  $Gait Training: 8-22 mins $Therapeutic Exercise: 8-22 mins  G Codes:      Alyene Predmore 03/28/14, 12:42 PM

## 2014-03-22 LAB — CBC
HEMATOCRIT: 22.4 % — AB (ref 39.0–52.0)
HEMOGLOBIN: 7.3 g/dL — AB (ref 13.0–17.0)
MCH: 28.5 pg (ref 26.0–34.0)
MCHC: 32.6 g/dL (ref 30.0–36.0)
MCV: 87.5 fL (ref 78.0–100.0)
Platelets: 136 10*3/uL — ABNORMAL LOW (ref 150–400)
RBC: 2.56 MIL/uL — ABNORMAL LOW (ref 4.22–5.81)
RDW: 13.7 % (ref 11.5–15.5)
WBC: 7.3 10*3/uL (ref 4.0–10.5)

## 2014-03-22 LAB — GLUCOSE, CAPILLARY
GLUCOSE-CAPILLARY: 129 mg/dL — AB (ref 70–99)
GLUCOSE-CAPILLARY: 111 mg/dL — AB (ref 70–99)
Glucose-Capillary: 125 mg/dL — ABNORMAL HIGH (ref 70–99)
Glucose-Capillary: 147 mg/dL — ABNORMAL HIGH (ref 70–99)

## 2014-03-22 LAB — PREPARE RBC (CROSSMATCH)

## 2014-03-22 MED ORDER — SODIUM CHLORIDE 0.9 % IV SOLN
Freq: Once | INTRAVENOUS | Status: AC
  Administered 2014-03-22: 12:00:00 via INTRAVENOUS

## 2014-03-22 MED ORDER — ACETAMINOPHEN 325 MG PO TABS
650.0000 mg | ORAL_TABLET | Freq: Once | ORAL | Status: AC
  Administered 2014-03-22: 650 mg via ORAL
  Filled 2014-03-22: qty 2

## 2014-03-22 NOTE — Progress Notes (Signed)
2nd unit of PRBC started at 1538.  Patient's daughter called to report iv leaking blood.  IV assessed, minimal amount of blood noted.  IV dressing taken off to further assess amount of blood leaking and site cleaned while blood transfusion continued to run.  IV was redressed and site checked frequently to monitor for any leakage.  Blood transfusion completed at 1850 with approximately less than 0.5cc of blood leakage.  Christen Bame RN

## 2014-03-22 NOTE — Progress Notes (Signed)
CARE MANAGEMENT NOTE 03/22/2014  Patient:  Joseph [name], Joseph [name]   Account Number:  [id number]  Date Initiated:  03/20/2014  Documentation initiated by:  [name]  Subjective/Objective Assessment:   81 yo male admitted s/p hip surgery. PTA lived at home.     Action/Plan:   Home when stable, daughter to assist with care   Anticipated DC Date:  03/22/2014   Anticipated DC Plan:  [place]  CM consult      [facility] Choice  HOME HEALTH   Choice offered to / List presented to:  [name]   DME arranged  HOSPITAL BED      DME agency  [place]     [place] arranged  HH-2 PT      [place]   Status of service:  Completed, signed off Medicare Important Message given?  YES (If response is "NO", the following Medicare IM given date fields will be blank) Date Medicare IM given:  03/22/2014 Medicare IM given by:  [facility] Date Additional Medicare IM given:   Additional Medicare IM given by:    Discharge Disposition:  [place]  Per UR Regulation:  Reviewed for med. necessity/level of care/duration of stay  If discussed at [place] of Stay Meetings, dates discussed:    Comments:  03/22/2014 1200 NCM spoke to pt and gave permission to speak to dtr, Joseph [name] # [phone]. Dtr states they have not received a call from [name]. Pt states someone called him yesterday and he instructed them to contact his son or dtr. He is not sure if it was [name] calling him. NCM contacted [name] [phone] and states they are not able to look up in the system. System is down on Sunday for maintenance. Faxed orders for hospital bed to [place], fax [phone] and [phone].   Left message with [name] weekday rep, [name] for follow up on hospital bed. [name] RN CCM Case Mgmt phone [phone]  03-20-14 [name] RN CM 1100 Spoke with patient and daughter at bedside. States patient plans to d/c  to home and daughter will provide care. Patient has hospital bed ordered, has all other DME needed. Awaiting PT to work with patient, has sat on side of bed but not been out of bed. Wants to use [facility] for [facility] services. Contacted [facility] to arrange, plan for d/c over weekend depending on progress.

## 2014-03-22 NOTE — Progress Notes (Signed)
Subjective: 3 Days Post-Op Procedure(s) (LRB): CONVERSION OF PREVIOUS HIP SURGERY TO LEFT TOTAL HIP ARTHROPLASTY (Left) Patient reports pain as mild to left leg. Reports Dizziness when getting up. Denies SOB, CP, or calf pain.   Objective: Vital signs in last 24 hours: Temp:  [98 F (36.7 C)-99 F (37.2 C)] 98 F (36.7 C) (02/21 0610) Pulse Rate:  [86-89] 89 (02/21 0610) Resp:  [16-18] 16 (02/21 0610) BP: (125-158)/(59-82) 158/82 mmHg (02/21 0610) SpO2:  [93 %-100 %] 100 % (02/21 0610)  Intake/Output from previous day: 02/20 0701 - 02/21 0700 In: 1080 [P.O.:1080] Out: 2067 [Urine:2025; Drains:42] Intake/Output this shift: Total I/O In: 240 [P.O.:240] Out: -    Recent Labs  03/20/14 0430 03/21/14 0515 03/22/14 0500  HGB 10.4* 8.3* 7.3*    Recent Labs  03/21/14 0515 03/22/14 0500  WBC 14.2* 7.3  RBC 2.84* 2.56*  HCT 24.6* 22.4*  PLT 149* 136*    Recent Labs  03/20/14 0430 03/21/14 0515  NA 138 136  K 5.0 3.9  CL 106 105  CO2 23 26  BUN 21 23  CREATININE 1.22 1.11  GLUCOSE 203* 148*  CALCIUM 8.2* 8.1*   No results for input(s): LABPT, INR in the last 72 hours.  Alert and oriented x3. RRR, Lungs clear, BS x4. Left Calf soft and non tender. L dressing C/D/I. No DVT signs. No signs of infection or compartment syndrome. LLE neurovascularly intact.   Assessment/Plan: 3 Days Post-Op Procedure(s) (LRB): CONVERSION OF PREVIOUS HIP SURGERY TO LEFT TOTAL HIP ARTHROPLASTY (Left)  Up with PT Later today Continue Current care Plan for D/c when ready.  Acute post-op anemia: Transfuse 2 units PRBC today H&h in am STILWELL, BRYSON L 03/22/2014, 8:39 AM

## 2014-03-22 NOTE — Progress Notes (Signed)
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  Seen in pm after first unit of blood.  Assisted pt OOB to amb in hallway a decreased distance due to c/o fatigue.  Positioned in recliner and perform THR TE's followed by ICE.  Follow Up Recommendations  Home health PT     Equipment Recommendations  None recommended by PT    Recommendations for Other Services       Precautions / Restrictions Precautions Precautions: Posterior Hip;Fall Precaution Comments: pt recalled 3/3 THP Restrictions Weight Bearing Restrictions: No Other Position/Activity Restrictions: WBAT    Mobility  Bed Mobility Overal bed mobility: Needs Assistance Bed Mobility: Supine to Sit     Supine to sit: Min guard     General bed mobility comments: cues for sequence.  Very min assist for LLE  Transfers Overall transfer level: Needs assistance Equipment used: Rolling walker (2 wheeled) Transfers: Sit to/from Stand Sit to Stand: Supervision;Min guard         General transfer comment: < 25% VC's on proper tech and turn completion prior to sit  Ambulation/Gait Ambulation/Gait assistance: Supervision;Min guard Ambulation Distance (Feet): 85 Feet Assistive device: Rolling walker (2 wheeled) Gait Pattern/deviations: Step-to pattern;Trunk flexed Gait velocity: decreased   General Gait Details: cues for posture, sequence, position from RW and ER on L.  decreased amb distance due to increased c/o fatigue.   Stairs            Wheelchair Mobility    Modified Rankin (Stroke Patients Only)       Balance                                    Cognition Arousal/Alertness: Awake/alert Behavior During Therapy: WFL for tasks assessed/performed Overall Cognitive Status:  Within Functional Limits for tasks assessed                      Exercises   Total Hip Replacement TE's 10 reps ankle pumps 10 reps knee presses 10 reps heel slides 10 reps SAQ's 10 reps ABD Followed by ICE     General Comments        Pertinent Vitals/Pain Pain Assessment: 0-10 Pain Score: 2  Pain Location: L hip Pain Descriptors / Indicators: Aching Pain Intervention(s): Monitored during session;Premedicated before session;Repositioned;Ice applied    Home Living                      Prior Function            PT Goals (current goals can now be found in the care plan section) Progress towards PT goals: Progressing toward goals    Frequency  7X/week    PT Plan      Co-evaluation             End of Session Equipment Utilized During Treatment: Gait belt Activity Tolerance: Patient limited by fatigue Patient left: in chair;with call bell/phone within reach;with nursing/sitter in room     Time: 1445-1511 PT Time Calculation (min) (ACUTE ONLY): 26 min  Charges:  $Gait Training: 8-22 mins $Therapeutic Exercise: 8-22 mins  G Codes:      Rica Koyanagi  PTA [facility]  Acute  Rehab Pager      [phone]

## 2014-03-22 NOTE — Progress Notes (Signed)
OT Cancellation Note  Patient Details Name: Joseph [name] MRN: [id number] DOB: April 09, 1931   Cancelled Treatment:    Reason Eval/Treat Not Completed: Medical issues which prohibited therapy ((Hgb 7.3, getting 2 units PRBCs today))  [name] A 03/22/2014, 9:06 AM

## 2014-03-23 LAB — HEMOGLOBIN AND HEMATOCRIT, BLOOD
HEMATOCRIT: 30.3 % — AB (ref 39.0–52.0)
Hemoglobin: 10.1 g/dL — ABNORMAL LOW (ref 13.0–17.0)

## 2014-03-23 LAB — TYPE AND SCREEN
ABO/RH(D): O POS
Antibody Screen: NEGATIVE
Unit division: 0
Unit division: 0

## 2014-03-23 LAB — GLUCOSE, CAPILLARY
Glucose-Capillary: 133 mg/dL — ABNORMAL HIGH (ref 70–99)
Glucose-Capillary: 148 mg/dL — ABNORMAL HIGH (ref 70–99)

## 2014-03-23 MED ORDER — RIVAROXABAN 10 MG PO TABS: 10.0000 mg | ORAL_TABLET | Freq: Every day | ORAL | Status: DC

## 2014-03-23 MED ORDER — METHOCARBAMOL 500 MG PO TABS: 500.0000 mg | ORAL_TABLET | Freq: Four times a day (QID) | ORAL | Status: DC | PRN

## 2014-03-23 MED ORDER — HYDROCODONE-ACETAMINOPHEN 5-325 MG PO TABS: 1.0000 | ORAL_TABLET | ORAL | Status: DC | PRN

## 2014-03-23 MED ORDER — TRAMADOL HCL 50 MG PO TABS: 50.0000 mg | ORAL_TABLET | Freq: Four times a day (QID) | ORAL | Status: DC | PRN

## 2014-03-23 NOTE — Discharge Summary (Signed)
Physician Discharge Summary   Patient ID: MELBOURNE JAKUBIAK MRN: [id number] DOB/AGE: 08-19-1931 81 y.o.  Admit date: 03/19/2014 Discharge date: 03/23/2014  Primary Diagnosis:  Nonunion left proximal femur fracture. Admission Diagnoses:  Past Medical History  Diagnosis Date  . Hypertension   . Seasonal allergies   . History of kidney stones   . GERD (gastroesophageal reflux disease)   . Arthritis   . History of nonunion of fracture     LEFT PROXIMAL FEMUR  . Diabetes mellitus without complication     DIET CONTROLLED  . Cancer     HX PROSTATE CANCER - MANY YRS AGO - ? RADIATION [place]   Discharge Diagnoses:   Active Problems:   Fracture, proximal femur  Estimated body mass index is 25.2 kg/(m^2) as calculated from the following:   Height as of this encounter: 6' 1" (1.854 m).   Weight as of this encounter: 86.637 kg (191 lb).  Procedure(s) (LRB): CONVERSION OF PREVIOUS HIP SURGERY TO LEFT TOTAL HIP ARTHROPLASTY (Left)   Consults: None  HPI: Mr. Kerstetter is an 81 year old male, who had a left proximal femur fracture approximately 2 years ago. He had done very well initially, then had pain related to the hardware. We did an exchange of the proximal lag screw and I got him better for about 6-8 months. He has had increasing groin pain, increasing thigh pain. It appears that an area of the fracture still has not healed. Due to the increasing pain and development of some degenerative changes in the hip, he presents now for hardware removal and conversion to a total hip Arthroplasty.  Laboratory Data: Admission on 03/19/2014, Discharged on 03/23/2014  Component Date Value Ref Range Status  . ABO/RH(D) 03/19/2014 O POS   Final  . Antibody Screen 03/19/2014 NEG   Final  . Sample Expiration 03/19/2014 03/22/2014   Final  . Unit Number 03/19/2014 [id number]   Final  . Blood Component Type 03/19/2014 RBC LR PHER1   Final  . Unit division 03/19/2014 00   Final  . Status of  Unit 03/19/2014 ISSUED,FINAL   Final  . Transfusion Status 03/19/2014 OK TO TRANSFUSE   Final  . Crossmatch Result 03/19/2014 Compatible   Final  . Unit Number 03/19/2014 [id number]   Final  . Blood Component Type 03/19/2014 RED CELLS,LR   Final  . Unit division 03/19/2014 00   Final  . Status of Unit 03/19/2014 ISSUED,FINAL   Final  . Transfusion Status 03/19/2014 OK TO TRANSFUSE   Final  . Crossmatch Result 03/19/2014 Compatible   Final  . Glucose-Capillary 03/19/2014 99  70 - 99 mg/dL Final  . Comment 1 03/19/2014 Notify RN   Final  . Glucose-Capillary 03/19/2014 147* 70 - 99 mg/dL Final  . Comment 1 03/19/2014 Notify RN   Final  . WBC 03/20/2014 13.0* 4.0 - 10.5 K/uL Final  . RBC 03/20/2014 3.66* 4.22 - 5.81 MIL/uL Final  . Hemoglobin 03/20/2014 10.4* 13.0 - 17.0 g/dL Final  . HCT 03/20/2014 31.9* 39.0 - 52.0 % Final  . MCV 03/20/2014 87.2  78.0 - 100.0 fL Final  . MCH 03/20/2014 28.4  26.0 - 34.0 pg Final  . MCHC 03/20/2014 32.6  30.0 - 36.0 g/dL Final  . RDW 03/20/2014 13.2  11.5 - 15.5 % Final  . Platelets 03/20/2014 174  150 - 400 K/uL Final  . Sodium 03/20/2014 138  135 - 145 mmol/L Final  . Potassium 03/20/2014 5.0  3.5 - 5.1 mmol/L Final  . Chloride  03/20/2014 106  96 - 112 mmol/L Final  . CO2 03/20/2014 23  19 - 32 mmol/L Final  . Glucose, Bld 03/20/2014 203* 70 - 99 mg/dL Final  . BUN 03/20/2014 21  6 - 23 mg/dL Final  . Creatinine, Ser 03/20/2014 1.22  0.50 - 1.35 mg/dL Final  . Calcium 03/20/2014 8.2* 8.4 - 10.5 mg/dL Final  . GFR calc non Af Amer 03/20/2014 54* >90 mL/min Final  . GFR calc Af Amer 03/20/2014 63* >90 mL/min Final   Comment: (NOTE) The eGFR has been calculated using the CKD EPI equation. This calculation has not been validated in all clinical situations. eGFR's persistently <90 mL/min signify possible Chronic Kidney Disease.   . Anion gap 03/20/2014 9  5 - 15 Final  . Glucose-Capillary 03/19/2014 183* 70 - 99 mg/dL Final  . Comment 1  03/19/2014 Notify RN   Final  . Comment 2 03/19/2014 Document in Chart   Final  . Glucose-Capillary 03/20/2014 109* 70 - 99 mg/dL Final  . Comment 1 03/20/2014 Notify RN   Final  . Comment 2 03/20/2014 Document in Chart   Final  . Glucose-Capillary 03/20/2014 176* 70 - 99 mg/dL Final  . Comment 1 03/20/2014 Notify RN   Final  . Comment 2 03/20/2014 Document in Chart   Final  . Glucose-Capillary 03/20/2014 179* 70 - 99 mg/dL Final  . Comment 1 03/20/2014 Notify RN   Final  . Comment 2 03/20/2014 Document in Chart   Final  . WBC 03/21/2014 14.2* 4.0 - 10.5 K/uL Final  . RBC 03/21/2014 2.84* 4.22 - 5.81 MIL/uL Final  . Hemoglobin 03/21/2014 8.3* 13.0 - 17.0 g/dL Final   Comment: DELTA CHECK NOTED REPEATED TO VERIFY   . HCT 03/21/2014 24.6* 39.0 - 52.0 % Final  . MCV 03/21/2014 86.6  78.0 - 100.0 fL Final  . MCH 03/21/2014 29.2  26.0 - 34.0 pg Final  . MCHC 03/21/2014 33.7  30.0 - 36.0 g/dL Final  . RDW 03/21/2014 13.6  11.5 - 15.5 % Final  . Platelets 03/21/2014 149* 150 - 400 K/uL Final  . Sodium 03/21/2014 136  135 - 145 mmol/L Final  . Potassium 03/21/2014 3.9  3.5 - 5.1 mmol/L Final   DELTA CHECK NOTED  . Chloride 03/21/2014 105  96 - 112 mmol/L Final  . CO2 03/21/2014 26  19 - 32 mmol/L Final  . Glucose, Bld 03/21/2014 148* 70 - 99 mg/dL Final  . BUN 03/21/2014 23  6 - 23 mg/dL Final  . Creatinine, Ser 03/21/2014 1.11  0.50 - 1.35 mg/dL Final  . Calcium 03/21/2014 8.1* 8.4 - 10.5 mg/dL Final  . GFR calc non Af Amer 03/21/2014 61* >90 mL/min Final  . GFR calc Af Amer 03/21/2014 70* >90 mL/min Final   Comment: (NOTE) The eGFR has been calculated using the CKD EPI equation. This calculation has not been validated in all clinical situations. eGFR's persistently <90 mL/min signify possible Chronic Kidney Disease.   . Anion gap 03/21/2014 5  5 - 15 Final  . Glucose-Capillary 03/20/2014 193* 70 - 99 mg/dL Final  . Glucose-Capillary 03/21/2014 123* 70 - 99 mg/dL Final  .  Glucose-Capillary 03/21/2014 109* 70 - 99 mg/dL Final  . WBC 03/22/2014 7.3  4.0 - 10.5 K/uL Final  . RBC 03/22/2014 2.56* 4.22 - 5.81 MIL/uL Final  . Hemoglobin 03/22/2014 7.3* 13.0 - 17.0 g/dL Final  . HCT 03/22/2014 22.4* 39.0 - 52.0 % Final  . MCV 03/22/2014 87.5  78.0 -  100.0 fL Final  . MCH 03/22/2014 28.5  26.0 - 34.0 pg Final  . MCHC 03/22/2014 32.6  30.0 - 36.0 g/dL Final  . RDW 03/22/2014 13.7  11.5 - 15.5 % Final  . Platelets 03/22/2014 136* 150 - 400 K/uL Final  . Glucose-Capillary 03/21/2014 115* 70 - 99 mg/dL Final  . Glucose-Capillary 03/22/2014 129* 70 - 99 mg/dL Final  . Order Confirmation 03/22/2014 ORDER PROCESSED BY BLOOD BANK   Final  . Glucose-Capillary 03/22/2014 111* 70 - 99 mg/dL Final  . Hemoglobin 03/23/2014 10.1* 13.0 - 17.0 g/dL Final   Comment: REPEATED TO VERIFY DELTA CHECK NOTED POST TRANSFUSION SPECIMEN   . HCT 03/23/2014 30.3* 39.0 - 52.0 % Final  . Glucose-Capillary 03/22/2014 125* 70 - 99 mg/dL Final  . Glucose-Capillary 03/22/2014 147* 70 - 99 mg/dL Final  . Glucose-Capillary 03/23/2014 133* 70 - 99 mg/dL Final  . Glucose-Capillary 03/23/2014 148* 70 - 99 mg/dL Final  . Comment 1 03/23/2014 Notify RN   Final  Hospital Outpatient Visit on 03/11/2014  Component Date Value Ref Range Status  . MRSA, PCR 03/11/2014 NEGATIVE  NEGATIVE Final  . Staphylococcus aureus 03/11/2014 NEGATIVE  NEGATIVE Final   Comment:        The Xpert SA Assay (FDA approved for NASAL specimens in patients over 29 years of age), is one component of a comprehensive surveillance program.  Test performance has been validated by [facility] for patients greater than or equal to 28 year old. It is not intended to diagnose infection nor to guide or monitor treatment.   Marland Kitchen aPTT 03/11/2014 31  24 - 37 seconds Final  . WBC 03/11/2014 6.7  4.0 - 10.5 K/uL Final  . RBC 03/11/2014 4.95  4.22 - 5.81 MIL/uL Final  . Hemoglobin 03/11/2014 14.3  13.0 - 17.0 g/dL Final  . HCT  03/11/2014 43.4  39.0 - 52.0 % Final  . MCV 03/11/2014 87.7  78.0 - 100.0 fL Final  . MCH 03/11/2014 28.9  26.0 - 34.0 pg Final  . MCHC 03/11/2014 32.9  30.0 - 36.0 g/dL Final  . RDW 03/11/2014 13.5  11.5 - 15.5 % Final  . Platelets 03/11/2014 197  150 - 400 K/uL Final  . Sodium 03/11/2014 139  135 - 145 mmol/L Final  . Potassium 03/11/2014 4.4  3.5 - 5.1 mmol/L Final  . Chloride 03/11/2014 107  96 - 112 mmol/L Final  . CO2 03/11/2014 28  19 - 32 mmol/L Final  . Glucose, Bld 03/11/2014 99  70 - 99 mg/dL Final  . BUN 03/11/2014 18  6 - 23 mg/dL Final  . Creatinine, Ser 03/11/2014 1.00  0.50 - 1.35 mg/dL Final  . Calcium 03/11/2014 9.3  8.4 - 10.5 mg/dL Final  . Total Protein 03/11/2014 6.8  6.0 - 8.3 g/dL Final  . Albumin 03/11/2014 4.1  3.5 - 5.2 g/dL Final  . AST 03/11/2014 18  0 - 37 U/L Final  . ALT 03/11/2014 11  0 - 53 U/L Final  . Alkaline Phosphatase 03/11/2014 92  39 - 117 U/L Final  . Total Bilirubin 03/11/2014 0.6  0.3 - 1.2 mg/dL Final  . GFR calc non Af Amer 03/11/2014 69* >90 mL/min Final  . GFR calc Af Amer 03/11/2014 80* >90 mL/min Final   Comment: (NOTE) The eGFR has been calculated using the CKD EPI equation. This calculation has not been validated in all clinical situations. eGFR's persistently <90 mL/min signify possible Chronic Kidney Disease.   Georgiann Hahn  gap 03/11/2014 4* 5 - 15 Final  . Prothrombin Time 03/11/2014 13.9  11.6 - 15.2 seconds Final  . INR 03/11/2014 1.06  0.00 - 1.49 Final  . Color, Urine 03/11/2014 YELLOW  YELLOW Final  . APPearance 03/11/2014 CLEAR  CLEAR Final  . Specific Gravity, Urine 03/11/2014 1.021  1.005 - 1.030 Final  . pH 03/11/2014 5.5  5.0 - 8.0 Final  . Glucose, UA 03/11/2014 >1000* NEGATIVE mg/dL Final  . Hgb urine dipstick 03/11/2014 NEGATIVE  NEGATIVE Final  . Bilirubin Urine 03/11/2014 NEGATIVE  NEGATIVE Final  . Ketones, ur 03/11/2014 NEGATIVE  NEGATIVE mg/dL Final  . Protein, ur 03/11/2014 NEGATIVE  NEGATIVE mg/dL Final  .  Urobilinogen, UA 03/11/2014 1.0  0.0 - 1.0 mg/dL Final  . Nitrite 03/11/2014 NEGATIVE  NEGATIVE Final  . Leukocytes, UA 03/11/2014 NEGATIVE  NEGATIVE Final  . WBC, UA 03/11/2014 0-2  <3 WBC/hpf Final  . RBC / HPF 03/11/2014 0-2  <3 RBC/hpf Final  . Bacteria, UA 03/11/2014 RARE  RARE Final     X-Rays:Dg Pelvis Portable  03/19/2014   CLINICAL DATA:  81 year old male status post left hip arthroplasty revision.  EXAM: PORTABLE PELVIS 1-2 VIEWS  COMPARISON:  Left hip radiograph 01/13/2013.  FINDINGS: Previously noted gamma nail fixation device has been removed. Left total hip arthroplasty is now noted. Acetabular and femoral components of the prosthesis appear properly seated, without definite periprosthetic fracture or immediate complicating features. There is a lateral plate overlying the greater trochanter, with cerclage wires proximally and distally. Gas is present in the joint space and in the overlying soft tissues, and there are skin staples in position. A surgical drain extends into the left hip joint. Foley catheter noted.  IMPRESSION: 1. Postoperative changes related to recent revision of prior ORIF and left total hip arthroplasty, as above, without immediate complicating features.   Electronically Signed   By: Vinnie Langton M.D.   On: 03/19/2014 21:31    EKG: Orders placed or performed during the hospital encounter of 01/13/13  . EKG     Hospital Course: Patient was admitted to [facility] and taken to the OR and underwent the above state procedure without complications.  Patient tolerated the procedure well and was later transferred to the recovery room and then to the orthopaedic floor for postoperative care.  They were given PO and IV analgesics for pain control following their surgery.  They were given 24 hours of postoperative antibiotics of  Anti-infectives    Start     Dose/Rate Route Frequency Ordered Stop   03/20/14 0600  ceFAZolin (ANCEF) IVPB 2 g/50 mL premix     2  g 100 mL/hr over 30 Minutes Intravenous On call to O.R. 03/19/14 1249 03/19/14 1554   03/19/14 2200  ceFAZolin (ANCEF) IVPB 2 g/50 mL premix     2 g 100 mL/hr over 30 Minutes Intravenous Every 6 hours 03/19/14 2002 03/20/14 0506     and started on DVT prophylaxis in the form of Xarelto.   PT and OT were ordered for total hip protocol.  The patient was allowed to be WBAT with therapy. Discharge planning was consulted to help with postop disposition and equipment needs.  Patient had a good night on the evening of surgery.  They started to get up OOB with therapy on day one.  Hemovac drain was left in place on the first day.  The knee immobilizer was removed and discontinued.  Continued to work with therapy into day two.  HV pulled on day two.  Dressing was changed on day two and the incision was healing well.  By day three, the patient had progressed with therapy but had dizziness and given two units of blood.  Patient was seen in rounds on day 4 and was ready to go home.   Discharge home with home health Diet - Cardiac diet Follow up - in 2 weeks Activity - WBAT Disposition - Home Condition Upon Discharge - improving D/C Meds - See DC Summary DVT Prophylaxis - Xarelto HGB 10.1 following transfusion.      Discharge Instructions    Call MD / Call 911    Complete by:  As directed   If you experience chest pain or shortness of breath, CALL 911 and be transported to the hospital emergency room.  If you develope a fever above 101 F, pus (white drainage) or increased drainage or redness at the wound, or calf pain, call your surgeon's office.     Change dressing    Complete by:  As directed   You may change your dressing dressing daily with sterile 4 x 4 inch gauze dressing and paper tape.  Do not submerge the incision under water.     Constipation Prevention    Complete by:  As directed   Drink plenty of fluids.  Prune juice may be helpful.  You may use a stool softener, such as Colace (over the  counter) 100 mg twice a day.  Use MiraLax (over the counter) for constipation as needed.     Diet - low sodium heart healthy    Complete by:  As directed      Discharge instructions    Complete by:  As directed   Pick up stool softner and laxative for home use following surgery while on pain medications. Do not submerge incision under water. Please use good hand washing techniques while changing dressing each day. May shower starting three days after surgery. Please use a clean towel to pat the incision dry following showers. Continue to use ice for pain and swelling after surgery. Do not use any lotions or creams on the incision until instructed by your surgeon. Hip precautions.  Total Hip Protocol.  Take Xarelto for two and a half more weeks, then discontinue Xarelto. Once the patient has completed the Xarelto, they may resume the 81 mg Aspirin.  Postoperative Constipation Protocol  Constipation - defined medically as fewer than three stools per week and severe constipation as less than one stool per week.  One of the most common issues patients have following surgery is constipation.  Even if you have a regular bowel pattern at home, your normal regimen is likely to be disrupted due to multiple reasons following surgery.  Combination of anesthesia, postoperative narcotics, change in appetite and fluid intake all can affect your bowels.  In order to avoid complications following surgery, here are some recommendations in order to help you during your recovery period.  Colace (docusate) - Pick up an over-the-counter form of Colace or another stool softener and take twice a day as long as you are requiring postoperative pain medications.  Take with a full glass of water daily.  If you experience loose stools or diarrhea, hold the colace until you stool forms back up.  If your symptoms do not get better within 1 week or if they get worse, check with your doctor.  Dulcolax (bisacodyl) - Pick  up over-the-counter and take as directed by the product packaging as  needed to assist with the movement of your bowels.  Take with a full glass of water.  Use this product as needed if not relieved by Colace only.   MiraLax (polyethylene glycol) - Pick up over-the-counter to have on hand.  MiraLax is a solution that will increase the amount of water in your bowels to assist with bowel movements.  Take as directed and can mix with a glass of water, juice, soda, coffee, or tea.  Take if you go more than two days without a movement. Do not use MiraLax more than once per day. Call your doctor if you are still constipated or irregular after using this medication for 7 days in a row.  If you continue to have problems with postoperative constipation, please contact the office for further assistance and recommendations.  If you experience "the worst abdominal pain ever" or develop nausea or vomiting, please contact the office immediatly for further recommendations for treatment.     Do not sit on low chairs, stoools or toilet seats, as it may be difficult to get up from low surfaces    Complete by:  As directed      Driving restrictions    Complete by:  As directed   No driving until released by the physician.     Follow the hip precautions as taught in Physical Therapy    Complete by:  As directed      Increase activity slowly as tolerated    Complete by:  As directed      Lifting restrictions    Complete by:  As directed   No lifting until released by the physician.     Patient may shower    Complete by:  As directed   You may shower without a dressing once there is no drainage.  Do not wash over the wound.  If drainage remains, do not shower until drainage stops.     TED hose    Complete by:  As directed   Use stockings (TED hose) for 3 weeks on both leg(s).  You may remove them at night for sleeping.     Weight bearing as tolerated    Complete by:  As directed   Laterality:  left  Extremity:   Lower            Medication List    STOP taking these medications        aspirin EC 81 MG tablet     oxyCODONE 5 MG immediate release tablet  Commonly known as:  Oxy IR/ROXICODONE      TAKE these medications        HYDROcodone-acetaminophen 5-325 MG per tablet  Commonly known as:  NORCO/VICODIN  Take 1-2 tablets by mouth every 4 (four) hours as needed for moderate pain.     lisinopril 20 MG tablet  Commonly known as:  PRINIVIL,ZESTRIL  Take 20 mg by mouth every morning.     loratadine 10 MG tablet  Commonly known as:  CLARITIN  Take 10 mg by mouth daily.     methocarbamol 500 MG tablet  Commonly known as:  ROBAXIN  Take 1 tablet (500 mg total) by mouth every 6 (six) hours as needed for muscle spasms.     metoprolol succinate 100 MG 24 hr tablet  Commonly known as:  TOPROL-XL  Take 100 mg by mouth daily with breakfast.     pantoprazole 40 MG tablet  Commonly known as:  PROTONIX  Take 40 mg by mouth  daily.     promethazine 12.5 MG tablet  Commonly known as:  PHENERGAN  Take 12.5 mg by mouth every 6 (six) hours as needed for nausea or vomiting.     rivaroxaban 10 MG Tabs tablet  Commonly known as:  XARELTO  - Take 1 tablet (10 mg total) by mouth daily with breakfast. Take Xarelto for two and a half more weeks, then discontinue Xarelto.  - Once the patient has completed the Xarelto, they may resume the 81 mg Aspirin.     traMADol 50 MG tablet  Commonly known as:  ULTRAM  Take 1-2 tablets (50-100 mg total) by mouth every 6 (six) hours as needed (mild pain).       Follow-up Information    Follow up with Gearlean Alf, MD. Schedule an appointment as soon as possible for a visit on 04/01/2014.   Specialty:  Orthopedic Surgery   Why:  Call office at [phone] to set up appointment with Dr. Wynelle Link.   Contact information:   [address] [place] [address] [phone]       Follow up with [facility].   Why:  physical  therapy   Contact information:   [address] [place] [address] [phone]       Follow up with [facility].   Why:  Hospital bed   Contact information:   [place] [address] [phone]       Signed: Arlee Muslim, PA-C Orthopaedic Surgery 04/01/2014, 9:41 PM

## 2014-03-23 NOTE — Progress Notes (Signed)
   Subjective: 4 Days Post-Op Procedure(s) (LRB): CONVERSION OF PREVIOUS HIP SURGERY TO LEFT TOTAL HIP ARTHROPLASTY (Left) Patient reports pain as mild.   Patient seen in rounds by Dr. Wynelle Link. Patient is well, but has had some minor complaints of pain in the hip, requiring pain medications Patient is ready to go home  Objective: Vital signs in last 24 hours: Temp:  [98.5 F (36.9 C)-99.9 F (37.7 C)] 99.4 F (37.4 C) (02/22 0510) Pulse Rate:  [78-86] 84 (02/22 0510) Resp:  [16-20] 16 (02/22 0510) BP: (126-167)/(64-80) 152/77 mmHg (02/22 0510) SpO2:  [96 %-100 %] 96 % (02/22 0510)  Intake/Output from previous day:  Intake/Output Summary (Last 24 hours) at 03/23/14 0713 Last data filed at 03/23/14 1540  Gross per 24 hour  Intake   1960 ml  Output   2250 ml  Net   -290 ml     Labs:  Recent Labs  03/21/14 0515 03/22/14 0500 03/23/14 0509  HGB 8.3* 7.3* 10.1*    Recent Labs  03/21/14 0515 03/22/14 0500 03/23/14 0509  WBC 14.2* 7.3  --   RBC 2.84* 2.56*  --   HCT 24.6* 22.4* 30.3*  PLT 149* 136*  --     Recent Labs  03/21/14 0515  NA 136  K 3.9  CL 105  CO2 26  BUN 23  CREATININE 1.11  GLUCOSE 148*  CALCIUM 8.1*   No results for input(s): LABPT, INR in the last 72 hours.  EXAM: General - Patient is Alert and Appropriate Extremity - Neurovascular intact Sensation intact distally Incision - clean, dry Motor Function - intact, moving foot and toes well on exam.   Assessment/Plan: 4 Days Post-Op Procedure(s) (LRB): CONVERSION OF PREVIOUS HIP SURGERY TO LEFT TOTAL HIP ARTHROPLASTY (Left) Procedure(s) (LRB): CONVERSION OF PREVIOUS HIP SURGERY TO LEFT TOTAL HIP ARTHROPLASTY (Left) Past Medical History  Diagnosis Date  . Hypertension   . Seasonal allergies   . History of kidney stones   . GERD (gastroesophageal reflux disease)   . Arthritis   . History of nonunion of fracture     LEFT PROXIMAL FEMUR  . Diabetes mellitus without complication     DIET CONTROLLED  . Cancer     HX PROSTATE CANCER - MANY YRS AGO - ? RADIATION [place]   Active Problems:   Fracture, proximal femur  Estimated body mass index is 25.2 kg/(m^2) as calculated from the following:   Height as of this encounter: 6\' 1"  (1.854 m).   Weight as of this encounter: 86.637 kg (191 lb). Discharge home with home health Diet - Cardiac diet Follow up - in 2 weeks Activity - WBAT Disposition - Home Condition Upon Discharge - improving D/C Meds - See DC Summary DVT Prophylaxis - Xarelto HGB 10.1 following transfusion.  Arlee Muslim, PA-C Orthopaedic Surgery 03/23/2014, 7:13 AM

## 2014-03-23 NOTE — Progress Notes (Signed)
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  Posterior hip precautions reviewed with pt and daughter.  HEP handout provided.  Pt and daughter had no further questions/concerns and pt feels ready for d/c home today.   Follow Up Recommendations  Home health PT     Equipment Recommendations  None recommended by PT    Recommendations for Other Services       Precautions / Restrictions Precautions Precautions: Posterior Hip;Fall Precaution Comments: reviewed hip precautions with pt and daughter Restrictions Weight Bearing Restrictions: No Other Position/Activity Restrictions: WBAT    Mobility  Bed Mobility Overal bed mobility: Needs Assistance Bed Mobility: Supine to Sit     Supine to sit: Min assist;HOB elevated     General bed mobility comments: assist for L LE over EOB  Transfers Overall transfer level: Needs assistance Equipment used: Rolling walker (2 wheeled) Transfers: Sit to/from Stand Sit to Stand: Supervision         General transfer comment: pt did well with safe technique  Ambulation/Gait Ambulation/Gait assistance: Supervision Ambulation Distance (Feet): 120 Feet Assistive device: Rolling walker (2 wheeled) Gait Pattern/deviations: Antalgic;Step-through pattern;Decreased step length - left     General Gait Details: verbal cues for RW positioning, step length, pace   Stairs Stairs: Yes Stairs assistance: Min guard Stair Management: Step to pattern;Forwards;With walker Number of Stairs: 1 General stair comments: verbal cues for safe technique, sequence with daughter present  Wheelchair Mobility    Modified Rankin (Stroke Patients Only)       Balance                                    Cognition Arousal/Alertness: Awake/alert Behavior During Therapy: WFL for tasks assessed/performed Overall Cognitive Status: Within Functional Limits for tasks assessed                      Exercises Total Joint Exercises Ankle Circles/Pumps: AROM;Both;15 reps;Supine Quad Sets: AROM;Both;10 reps;Supine Gluteal Sets: AROM;Both;10 reps;Supine Towel Squeeze: AROM;Both;10 reps;Supine Short Arc Quad: 10 reps;Left;AROM Heel Slides: AAROM;Left;Supine;10 reps (within precautions) Hip ABduction/ADduction: AROM;Left;10 reps Straight Leg Raises: 10 reps;Left;AAROM    General Comments        Pertinent Vitals/Pain Pain Assessment: 0-10 Pain Score: 5  Pain Location: L hip Pain Descriptors / Indicators: Aching;Sore Pain Intervention(s): Limited activity within patient's tolerance;Monitored during session;Repositioned;Ice applied    Home Living                      Prior Function            PT Goals (current goals can now be found in the care plan section) Progress towards PT goals: Progressing toward goals    Frequency  7X/week    PT Plan Current plan remains appropriate    Co-evaluation             End of Session   Activity Tolerance: Patient tolerated treatment well Patient left: in chair;with call bell/phone within reach;with family/visitor present     Time: 0913-0940 PT Time Calculation (min) (ACUTE ONLY): 27 min  Charges:  $Gait Training: 8-22 mins $Therapeutic Exercise: 8-22 mins  G CodesTrena Platt 03-29-2014, 10:58 AM Carmelia Bake, PT, DPT 29-Mar-2014 Pager: [phone]

## 2014-03-23 NOTE — Plan of Care (Signed)
Problem: Phase III Progression Outcomes Goal: Discharge plan remains appropriate-arrangements made Outcome: Completed/Met Date Met:  03/23/14 Pt to be discharged home once pt has a hospital bed at his home.

## 2014-03-23 NOTE — Progress Notes (Addendum)
CARE MANAGEMENT NOTE 03/23/2014  Patient:  Joseph [name], Joseph [name]   Account Number:  [id number]  Date Initiated:  03/20/2014  Documentation initiated by:  [name]  Subjective/Objective Assessment:   81 yo male admitted s/p hip surgery. PTA lived at home.     Action/Plan:   Home when stable, daughter to assist with care   Anticipated DC Date:  03/22/2014   Anticipated DC Plan:  [place]  CM consult      [name]  HOME HEALTH   Choice offered to / List presented to:  [name]   DME arranged  HOSPITAL BED      DME agency  [place]     [place] arranged  HH-2 PT      [place]   Status of service:  Completed, signed off Medicare Important Message given?  YES (If response is "NO", the following Medicare IM given date fields will be blank) Date Medicare IM given:  03/22/2014 Medicare IM given by:  [name] Date Additional Medicare IM given:   Additional Medicare IM given by:    Discharge Disposition:  [name]  Per UR Regulation:  Reviewed for med. necessity/level of care/duration of stay  If discussed at [place] of Stay Meetings, dates discussed:    Comments:  03/23/2014 1149 Spoke to dtr and states she has pt's cell phone and has received call. Provided dtr with [name] contact number to arrange delivery of bed. Explained [name] states he will have a $11 copay. States she will give them a call. [name] RN CCM Case Mgmt phone [phone]  03/23/2014 1143 NCM spoke to dtr and they have not received call from [place] to arrange hospital bed. Contacted [name] [phone]. States they have reached out to call dtr and left message on pt's cell number. Provided rep with correct number to follow up with dtr, [name]. States they have processed the order and are waiting to deliver. [name] RN CCM Case Mgmt phone [phone]  03/22/2014 1200 NCM spoke to pt and gave  permission to speak to dtr, [name] # [phone]. Dtr states they have not received a call from [name]. Pt states someone called him yesterday and he instructed them to contact his son or dtr. He is not sure if it was [name] calling him. NCM contacted [name] [phone] and states they are not able to look up in the system. System is down on Sunday for maintenance. Faxed orders for hospital bed to [place], fax [phone] and [phone].   Left message with [name] weekday rep, [name] for follow up on hospital bed. [name] RN CCM Case Mgmt phone [phone]  03-20-14 [name] RN CM 1100 Spoke with patient and daughter at bedside. States patient plans to d/c to home and daughter will provide care. Patient has hospital bed ordered, has all other DME needed. Awaiting PT to work with patient, has sat on side of bed but not been out of bed. Wants to use [facility] for [facility] services. Contacted [facility] to arrange, plan for d/c over weekend depending on progress.

## 2014-03-23 NOTE — Progress Notes (Signed)
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 Recommend HHOT to reinforce safety and THPs with ADL. Pt supposed to d/c today.    Follow Up Recommendations  Home health OT;Supervision/Assistance - 24 hour    Equipment Recommendations  None recommended by OT    Recommendations for Other Services      Precautions / Restrictions Precautions Precautions: Posterior Hip;Fall Precaution Comments: reviewed hip precautions with pt and daughter Restrictions Weight Bearing Restrictions: No Other Position/Activity Restrictions: WBAT       Mobility Bed Mobility                  Transfers Overall transfer level: Needs assistance Equipment used: Rolling walker (2 wheeled) Transfers: Sit to/from Stand Sit to Stand: Min guard         General transfer comment: verbal cues for THPs    Balance                                   ADL                       Lower Body Dressing: Minimal assistance;With adaptive equipment;Sit to/from stand           Tub/ Shower Transfer: Minimal assistance;Rolling walker     General ADL Comments: Pt states he does have AE from previous surgery but he is not certain of the condition of the AE. Educated on coverage and family states she may obtain the kit in the gift shop. Practiced with reacher to doff socks, reacher to don pants, and sock aid to don socks. Pt needed intermittent cues to not exceed 90 degrees hip flexion. Also 1 cue to not let L toes turn in with standing. Practiced shower transfer also and had family member assist with holding walker steady. issued shower transfer handout also.  Discussed option to use 3in1 as shower chair initially to have armrests as more  support under him than grab bars on the wall if desired. Pt and family verbalized understanding of shower transfer with walker and 3in1 use.       Vision                     Perception     Praxis      Cognition   Behavior During Therapy: WFL for tasks assessed/performed Overall Cognitive Status: Within Functional Limits for tasks assessed                       Extremity/Trunk Assessment               Exercises     Shoulder Instructions       General Comments      Pertinent Vitals/ Pain       Pain Assessment: 0-10 Pain Score: 5  Pain Location: L hip Pain Descriptors / Indicators: Aching;Sore Pain Intervention(s): Limited activity within patient's tolerance;Monitored during session;Repositioned;Ice applied  Home Living  Prior Functioning/Environment              Frequency Min 2X/week     Progress Toward Goals  OT Goals(current goals can now be found in the care plan section)  Progress towards OT goals: Progressing toward goals     Plan Discharge plan remains appropriate    Co-evaluation                 End of Session Equipment Utilized During Treatment: Rolling walker   Activity Tolerance Patient tolerated treatment well   Patient Left in chair;with call bell/phone within reach;with family/visitor present   Nurse Communication          Time: 4045-9136 OT Time Calculation (min): 27 min  Charges: OT General Charges $OT Visit: 1 Procedure OT Treatments $Self Care/Home Management : 8-22 mins $Therapeutic Activity: 8-22 mins  Jules Schick  [phone] 03/23/2014, 10:53 AM

## 2014-03-24 ENCOUNTER — Encounter (HOSPITAL_COMMUNITY): Payer: Self-pay | Admitting: Orthopedic Surgery

## 2014-09-29 ENCOUNTER — Encounter: Payer: Self-pay | Admitting: Internal Medicine

## 2014-11-15 ENCOUNTER — Emergency Department (HOSPITAL_COMMUNITY)
Admission: EM | Admit: 2014-11-15 | Discharge: 2014-11-16 | Disposition: A | Discharge status: Home / Self Care | Payer: Commercial Managed Care - HMO | Attending: Emergency Medicine | Admitting: Emergency Medicine

## 2014-11-15 ENCOUNTER — Emergency Department (HOSPITAL_COMMUNITY): Payer: Commercial Managed Care - HMO

## 2014-11-15 ENCOUNTER — Encounter (HOSPITAL_COMMUNITY): Payer: Self-pay

## 2014-11-15 DIAGNOSIS — Z87442 Personal history of urinary calculi: Secondary | ICD-10-CM | POA: Insufficient documentation

## 2014-11-15 DIAGNOSIS — Z7982 Long term (current) use of aspirin: Secondary | ICD-10-CM | POA: Insufficient documentation

## 2014-11-15 DIAGNOSIS — Z8546 Personal history of malignant neoplasm of prostate: Secondary | ICD-10-CM | POA: Diagnosis not present

## 2014-11-15 DIAGNOSIS — E119 Type 2 diabetes mellitus without complications: Secondary | ICD-10-CM | POA: Insufficient documentation

## 2014-11-15 DIAGNOSIS — K219 Gastro-esophageal reflux disease without esophagitis: Secondary | ICD-10-CM | POA: Insufficient documentation

## 2014-11-15 DIAGNOSIS — I10 Essential (primary) hypertension: Secondary | ICD-10-CM | POA: Diagnosis not present

## 2014-11-15 DIAGNOSIS — R42 Dizziness and giddiness: Secondary | ICD-10-CM | POA: Insufficient documentation

## 2014-11-15 DIAGNOSIS — Z79899 Other long term (current) drug therapy: Secondary | ICD-10-CM | POA: Insufficient documentation

## 2014-11-15 DIAGNOSIS — D509 Iron deficiency anemia, unspecified: Secondary | ICD-10-CM | POA: Diagnosis not present

## 2014-11-15 LAB — I-STAT TROPONIN, ED: Troponin i, poc: 0 ng/mL (ref 0.00–0.08)

## 2014-11-15 LAB — COMPREHENSIVE METABOLIC PANEL
ALT: 8 U/L — AB (ref 17–63)
AST: 16 U/L (ref 15–41)
Albumin: 3.6 g/dL (ref 3.5–5.0)
Alkaline Phosphatase: 97 U/L (ref 38–126)
Anion gap: 7 (ref 5–15)
BUN: 16 mg/dL (ref 6–20)
CO2: 23 mmol/L (ref 22–32)
CREATININE: 1.1 mg/dL (ref 0.61–1.24)
Calcium: 8.5 mg/dL — ABNORMAL LOW (ref 8.9–10.3)
Chloride: 104 mmol/L (ref 101–111)
Glucose, Bld: 131 mg/dL — ABNORMAL HIGH (ref 65–99)
POTASSIUM: 4 mmol/L (ref 3.5–5.1)
SODIUM: 134 mmol/L — AB (ref 135–145)
Total Bilirubin: 0.5 mg/dL (ref 0.3–1.2)
Total Protein: 6.3 g/dL — ABNORMAL LOW (ref 6.5–8.1)

## 2014-11-15 LAB — DIFFERENTIAL
BASOS ABS: 0 10*3/uL (ref 0.0–0.1)
BASOS PCT: 1 %
Eosinophils Absolute: 0.1 10*3/uL (ref 0.0–0.7)
Eosinophils Relative: 1 %
Lymphocytes Relative: 17 %
Lymphs Abs: 1 10*3/uL (ref 0.7–4.0)
MONOS PCT: 12 %
Monocytes Absolute: 0.7 10*3/uL (ref 0.1–1.0)
NEUTROS ABS: 4 10*3/uL (ref 1.7–7.7)
Neutrophils Relative %: 69 %

## 2014-11-15 LAB — I-STAT CHEM 8, ED
BUN: 19 mg/dL (ref 6–20)
Calcium, Ion: 1.17 mmol/L (ref 1.13–1.30)
Chloride: 106 mmol/L (ref 101–111)
Creatinine, Ser: 1.1 mg/dL (ref 0.61–1.24)
Glucose, Bld: 128 mg/dL — ABNORMAL HIGH (ref 65–99)
HEMATOCRIT: 40 % (ref 39.0–52.0)
HEMOGLOBIN: 13.6 g/dL (ref 13.0–17.0)
POTASSIUM: 4.1 mmol/L (ref 3.5–5.1)
SODIUM: 140 mmol/L (ref 135–145)
TCO2: 21 mmol/L (ref 0–100)

## 2014-11-15 LAB — CBC
HCT: 38.7 % — ABNORMAL LOW (ref 39.0–52.0)
Hemoglobin: 11.9 g/dL — ABNORMAL LOW (ref 13.0–17.0)
MCH: 23.8 pg — ABNORMAL LOW (ref 26.0–34.0)
MCHC: 30.7 g/dL (ref 30.0–36.0)
MCV: 77.2 fL — ABNORMAL LOW (ref 78.0–100.0)
PLATELETS: 209 10*3/uL (ref 150–400)
RBC: 5.01 MIL/uL (ref 4.22–5.81)
RDW: 15.1 % (ref 11.5–15.5)
WBC: 5.8 10*3/uL (ref 4.0–10.5)

## 2014-11-15 LAB — PROTIME-INR
INR: 1.09 (ref 0.00–1.49)
PROTHROMBIN TIME: 14.3 s (ref 11.6–15.2)

## 2014-11-15 LAB — CBG MONITORING, ED: Glucose-Capillary: 129 mg/dL — ABNORMAL HIGH (ref 65–99)

## 2014-11-15 LAB — APTT: APTT: 30 s (ref 24–37)

## 2014-11-15 NOTE — ED Notes (Signed)
When to get v/s on patients P 40. Reported to Triage Rn, of V/S and repeated EKG. Repeat EKG shown to Dr.Pfieffer. Dr. Phill Myron that patient be moved to Room ASAP. Patient moved to room B17

## 2014-11-15 NOTE — ED Notes (Signed)
Onset yesterday dizziness, nausea and vomited x 1.  No facial droop, hand grips equal.  No other s/s noted.

## 2014-11-15 NOTE — ED Provider Notes (Signed)
CSN: [id number]   Arrival date & time 11/15/14 2219  History  By signing my name below, I, Joseph [name], attest that this documentation has been prepared under the direction and in the presence of Joseph Fraise, MD. Electronically Signed: Altamease [name], ED Scribe. 11/16/2014. 1:12 AM.  Chief Complaint  Patient presents with  . Dizziness    HPI Patient is a 81 y.o. male presenting with dizziness. The history is provided by the patient. No language interpreter was used.  Dizziness Quality:  Room spinning Severity:  Moderate Onset quality:  Gradual Duration:  1 day Timing:  Constant Progression:  Worsening Chronicity:  New Context: head movement and standing up   Relieved by:  Nothing Worsened by:  Movement and standing up Ineffective treatments:  None tried Associated symptoms: nausea and vomiting   Associated symptoms: no blood in stool, no chest pain, no diarrhea, no headaches, no shortness of breath, no syncope and no weakness   Risk factors: hx of vertigo    Joseph [name] is a 81 y.o. male with PMHx of HTN, DM, and prostate cancer s/p radiation therapy who presents to the Emergency Department complaining of worsening  "swimmy-headed" dizziness with onset yesterday.The sensation is significantly worse with walking and head movement. Associated symptoms include nausea and 1 episode of emesis. Today he had an episode wear he felt that he might pass out but denies syncope.He had similar symptoms 6 months ago that improved with meclizine.  Pt denies headache, fever, tinnitus, vision change, diarrhea, chest pain, SOB, abdominal pain, extremity weakness, numbness, and speech difficulty. No history of stroke. No current anticoagulation.   Past Medical History  Diagnosis Date  . Hypertension   . Seasonal allergies   . History of kidney stones   . GERD (gastroesophageal reflux disease)   . Arthritis   . History of nonunion of fracture     LEFT PROXIMAL FEMUR  . Diabetes  mellitus without complication (HCC)     DIET CONTROLLED  . Cancer ([place])     HX PROSTATE CANCER - MANY YRS AGO - ? RADIATION [place]    Past Surgical History  Procedure Laterality Date  . Femur im nail  01/18/2012    Procedure: INTRAMEDULLARY (IM) NAIL FEMORAL;  Surgeon: Joseph Alf, MD;  Location: [facility];  Service: Orthopedics;  Laterality: Left;  . Hiatal hernia repair  2011  . Hardware removal Left 01/13/2013    Procedure: HARDWARE REVISION LEFT HIP AND EXCHANGE OF COMPRESSION SCREW;  Surgeon: Joseph Alf, MD;  Location: [facility];  Service: Orthopedics;  Laterality: Left;  . Joint replacement  12/2011    BIL KNEE REPLACEMENTS  . Conversion to total hip Left 03/19/2014    Procedure: CONVERSION OF PREVIOUS HIP SURGERY TO LEFT TOTAL HIP ARTHROPLASTY;  Surgeon: Joseph Alf, MD;  Location: [facility];  Service: Orthopedics;  Laterality: Left;    Family History  Problem Relation Age of Onset  . Colon cancer Neg Hx   . Esophageal cancer Neg Hx   . Rectal cancer Neg Hx   . Stomach cancer Neg Hx   . Heart attack Mother   . Hypertension Mother   . Heart attack Father   . Hypertension Father     Social History  Substance Use Topics  . Smoking status: Never Smoker   . Smokeless tobacco: Never Used  . Alcohol Use: No     Review of Systems  Respiratory: Negative for shortness of breath.   Cardiovascular: Negative for chest  pain and syncope.  Gastrointestinal: Positive for nausea and vomiting. Negative for diarrhea and blood in stool.  Neurological: Positive for dizziness. Negative for weakness and headaches.  All other systems reviewed and are negative.  Home Medications   Prior to Admission medications   Medication Sig Start Date End Date Taking? Authorizing Provider  aspirin EC 81 MG tablet Take 81 mg by mouth daily.   Yes Historical Provider, MD  lisinopril (PRINIVIL,ZESTRIL) 20 MG tablet Take 20 mg by mouth every morning.   Yes Historical Provider, MD  loratadine (CLARITIN) 10  MG tablet Take 10 mg by mouth daily.   Yes Historical Provider, MD  metoprolol succinate (TOPROL-XL) 100 MG 24 hr tablet Take 100 mg by mouth daily with breakfast.    Yes Historical Provider, MD  pantoprazole (PROTONIX) 40 MG tablet Take 40 mg by mouth daily.   Yes Historical Provider, MD  promethazine (PHENERGAN) 12.5 MG tablet Take 12.5 mg by mouth every 6 (six) hours as needed for nausea or vomiting.   Yes Historical Provider, MD    Allergies  Review of patient's allergies indicates no known allergies.  Triage Vitals: BP 155/77 mmHg  Pulse 40  Temp(Src) 97.8 F (36.6 C)  Resp 16  SpO2 99%  Physical Exam CONSTITUTIONAL: elderly HEAD: Normocephalic/atraumatic EYES: EOMI/PERRL ENMT: Mucous membranes moist NECK: supple no meningeal signs SPINE/BACK:entire spine nontender CV: S1/S2 noted, no murmurs/rubs/gallops noted LUNGS: Lungs are clear to auscultation bilaterally, no apparent distress ABDOMEN: soft, nontender, no rebound or guarding, bowel sounds noted throughout abdomen GU:no cva tenderness NEURO: Pt is awake/alert/appropriate, moves all extremitiesx4.  No facial droop.  No arm or leg drift. No past pointing. Mild ataxia noted.  EXTREMITIES: pulses normal/equal, full ROM SKIN: warm, color normal PSYCH: no abnormalities of mood noted, alert and oriented to situation  ED Course  Procedures  DIAGNOSTIC STUDIES: Oxygen Saturation is 99% on RA, normal by my interpretation.    COORDINATION OF CARE: 11:15 PM Discussed treatment plan which includes lab work, CT head, EKG with pt at bedside and pt agreed to plan.  tPA in stroke considered but not given due to: Onset over 3-4.5hours  1:11 AM-Consult complete with the Hospitalist. Patient case explained and discussed. Agrees to admit patient for further evaluation and treatment. Call ended at 1:12 AM Will admit for further testing.  He has significant vertigo and is a high fall risk.  I feel he would benefit from admission for  monitoring, he may need MRI to evaluate for occult stroke.  Pt agreeable  Labs Reviewed  CBC - Abnormal; Notable for the following:    Hemoglobin 11.9 (*)    HCT 38.7 (*)    MCV 77.2 (*)    MCH 23.8 (*)    All other components within normal limits  COMPREHENSIVE METABOLIC PANEL - Abnormal; Notable for the following:    Sodium 134 (*)    Glucose, Bld 131 (*)    Calcium 8.5 (*)    Total Protein 6.3 (*)    ALT 8 (*)    All other components within normal limits  CBG MONITORING, ED - Abnormal; Notable for the following:    Glucose-Capillary 129 (*)    All other components within normal limits  I-STAT CHEM 8, ED - Abnormal; Notable for the following:    Glucose, Bld 128 (*)    All other components within normal limits  PROTIME-INR  APTT  DIFFERENTIAL  URINALYSIS, ROUTINE W REFLEX MICROSCOPIC (NOT AT [facility])  Randolm Idol, ED  Imaging Review Ct Head Wo Contrast  11/16/2014  CLINICAL DATA:  Dizziness, nausea and vomiting.  Onset yesterday. EXAM: CT HEAD WITHOUT CONTRAST TECHNIQUE: Contiguous axial images were obtained from the base of the skull through the vertex without intravenous contrast. COMPARISON:  01/24/2008 FINDINGS: No intracranial hemorrhage, mass effect, or midline shift. No hydrocephalus. The basilar cisterns are patent. No evidence of territorial infarct. No intracranial fluid collection. Age-related atrophy and chronic small vessel ischemic change. Atherosclerosis noted of the skullbase vasculature. Calvarium is intact. Included paranasal sinuses and mastoid air cells are well aerated. IMPRESSION: No acute intracranial abnormality. Electronically Signed   By: Jeb Levering M.D.   On: 11/16/2014 00:49    I personally reviewed and evaluated these  lab results as a part of my medical decision-making.   EKG Interpretation  Date/Time:  Sunday November 15 2014 22:56:33 EDT Ventricular Rate:  71 PR Interval:  148 QRS Duration: 80 QT Interval:  458 QTC  Calculation: 497 R Axis:   -2 Text Interpretation:  Sinus rhythm with Blocked Premature atrial complexes with occasional Premature ventricular complexes Prolonged QT Abnormal ECG Confirmed by Christy Gentles  MD, Elenore Rota ([id number]) on 11/15/2014 11:06:45 PM    MDM   Final diagnoses:  Vertigo     Nursing notes including past medical history and social history reviewed and considered in documentation Labs/vital reviewed myself and considered during evaluation  I, Sharyon Cable, personally performed the services described in this documentation. All medical record entries made by the scribe were at my direction and in my presence.  I have reviewed the chart and discharge instructions and agree that the record reflects my personal performance and is accurate and complete. Sharyon Cable.  11/16/2014. 1:23 AM.       Joseph Fraise, MD 11/16/14 [phone]

## 2014-11-16 ENCOUNTER — Emergency Department (HOSPITAL_COMMUNITY): Payer: Commercial Managed Care - HMO

## 2014-11-16 DIAGNOSIS — I10 Essential (primary) hypertension: Secondary | ICD-10-CM | POA: Diagnosis not present

## 2014-11-16 DIAGNOSIS — D509 Iron deficiency anemia, unspecified: Secondary | ICD-10-CM

## 2014-11-16 DIAGNOSIS — R42 Dizziness and giddiness: Secondary | ICD-10-CM

## 2014-11-16 LAB — URINALYSIS, ROUTINE W REFLEX MICROSCOPIC
Bilirubin Urine: NEGATIVE
GLUCOSE, UA: 250 mg/dL — AB
Hgb urine dipstick: NEGATIVE
Ketones, ur: NEGATIVE mg/dL
LEUKOCYTES UA: NEGATIVE
Nitrite: NEGATIVE
PROTEIN: NEGATIVE mg/dL
SPECIFIC GRAVITY, URINE: 1.02 (ref 1.005–1.030)
Urobilinogen, UA: 1 mg/dL (ref 0.0–1.0)
pH: 6 (ref 5.0–8.0)

## 2014-11-16 LAB — CBG MONITORING, ED: Glucose-Capillary: 135 mg/dL — ABNORMAL HIGH (ref 65–99)

## 2014-11-16 MED ORDER — MECLIZINE HCL 25 MG PO TABS: 25.0000 mg | ORAL_TABLET | Freq: Two times a day (BID) | ORAL | Status: DC | PRN

## 2014-11-16 MED ORDER — ONDANSETRON HCL 4 MG/2ML IJ SOLN
4.0000 mg | Freq: Once | INTRAMUSCULAR | Status: AC
  Administered 2014-11-16: 4 mg via INTRAVENOUS
  Filled 2014-11-16: qty 2

## 2014-11-16 MED ORDER — MECLIZINE HCL 25 MG PO TABS
25.0000 mg | ORAL_TABLET | Freq: Once | ORAL | Status: AC
  Administered 2014-11-16: 25 mg via ORAL
  Filled 2014-11-16: qty 1

## 2014-11-16 NOTE — ED Notes (Signed)
Discharge instructions/prescription reviewed with patient/daughter. Understanding verbalized. No distress noted at time of discharge. Denies dizziness at this time.

## 2014-11-16 NOTE — Consult Note (Signed)
Referring Physician: Dan[place]    Chief Complaint: Vertigo  HPI: Joseph [name] is an 81 y.o. male who reports that on yesterday he had an episode of vertigo that resolved.  He awakened this morning at baseline.  He was able to go to church.  This evening has the acute recurrence of vertigo.  Nausea was associated with vomiting as well.  With no resolution patient presented for evaluation.  Has received Meclizine with some improvement.   Reports that about 6 months ago had vertigo that was successfully treated with Meclizine.    Date last known well: Date: 11/15/2014 Time last known well: Time: 17:30 tPA Given: No: Outside time window  MRankin: 0  Past Medical History  Diagnosis Date  . Hypertension   . Seasonal allergies   . History of kidney stones   . GERD (gastroesophageal reflux disease)   . Arthritis   . History of nonunion of fracture     LEFT PROXIMAL FEMUR  . Diabetes mellitus without complication (HCC)     DIET CONTROLLED  . Cancer ([place])     HX PROSTATE CANCER - MANY YRS AGO - ? RADIATION [place]    Past Surgical History  Procedure Laterality Date  . Femur im nail  01/18/2012    Procedure: INTRAMEDULLARY (IM) NAIL FEMORAL;  Surgeon: Gearlean Alf, MD;  Location: [place];  Service: Orthopedics;  Laterality: Left;  . Hiatal hernia repair  2011  . Hardware removal Left 01/13/2013    Procedure: HARDWARE REVISION LEFT HIP AND EXCHANGE OF COMPRESSION SCREW;  Surgeon: Gearlean Alf, MD;  [facility];  Service: Orthopedics;  Laterality: Left;  . Joint replacement  12/2011    BIL KNEE REPLACEMENTS  . Conversion to total hip Left 03/19/2014    Procedure: CONVERSION OF PREVIOUS HIP SURGERY TO LEFT TOTAL HIP ARTHROPLASTY;  Surgeon: Gearlean Alf, MD;  Location: [facility];  Service: Orthopedics;  Laterality: Left;    Family History  Problem Relation Age of Onset  . Colon cancer Neg Hx   . Esophageal cancer Neg Hx   . Rectal cancer Neg Hx   . Stomach cancer Neg Hx   . Heart  attack Mother   . Hypertension Mother   . Heart attack Father   . Hypertension Father    Social History:  reports that he has never smoked. He has never used smokeless tobacco. He reports that he does not drink alcohol or use illicit drugs.  Allergies: No Known Allergies  Medications: I have reviewed the patient's current medications. Prior to Admission:  Prior to Admission medications   Medication Sig Start Date End Date Taking? Authorizing Provider  aspirin EC 81 MG tablet Take 81 mg by mouth daily.   Yes Historical Provider, MD  lisinopril (PRINIVIL,ZESTRIL) 20 MG tablet Take 20 mg by mouth every morning.   Yes Historical Provider, MD  loratadine (CLARITIN) 10 MG tablet Take 10 mg by mouth daily.   Yes Historical Provider, MD  metoprolol succinate (TOPROL-XL) 100 MG 24 hr tablet Take 100 mg by mouth daily with breakfast.    Yes Historical Provider, MD  pantoprazole (PROTONIX) 40 MG tablet Take 40 mg by mouth daily.   Yes Historical Provider, MD  promethazine (PHENERGAN) 12.5 MG tablet Take 12.5 mg by mouth every 6 (six) hours as needed for nausea or vomiting.   Yes Historical Provider, MD    ROS: History obtained from the patient  General ROS: negative for - chills, fatigue, fever, night sweats,  weight gain or weight loss Psychological ROS: negative for - behavioral disorder, hallucinations, memory difficulties, mood swings or suicidal ideation Ophthalmic ROS: negative for - blurry vision, double vision, eye pain or loss of vision ENT ROS: negative for - epistaxis, nasal discharge, oral lesions, sore throat, tinnitus  Allergy and Immunology ROS: negative for - hives or itchy/watery eyes Hematological and Lymphatic ROS: negative for - bleeding problems, bruising or swollen lymph nodes Endocrine ROS: negative for - galactorrhea, hair pattern changes, polydipsia/polyuria or temperature intolerance Respiratory ROS: negative for - cough, hemoptysis, shortness of breath or  wheezing Cardiovascular ROS: negative for - chest pain, dyspnea on exertion, edema or irregular heartbeat Gastrointestinal ROS: as noted in HPI Genito-Urinary ROS: negative for - dysuria, hematuria, incontinence or urinary frequency/urgency Musculoskeletal ROS: negative for - joint swelling or muscular weakness Neurological ROS: as noted in HPI Dermatological ROS: negative for rash and skin lesion changes  Physical Examination: Blood pressure 155/77, pulse 40, temperature 97.8 F (36.6 C), resp. rate 16, SpO2 99 %.  HEENT-  Normocephalic, no lesions, without obvious abnormality.  Normal external eye and conjunctiva.  Normal TM's bilaterally.  Normal auditory canals and external ears. Normal external nose, mucus membranes and septum.  Normal pharynx. Cardiovascular- S1, S2 normal, pulses palpable throughout   Lungs- chest clear, no wheezing, rales, normal symmetric air entry Abdomen- soft, non-tender; bowel sounds normal; no masses,  no organomegaly Extremities- no edema Lymph-no adenopathy palpable Musculoskeletal-no joint tenderness, deformity or swelling Skin-warm and dry, no hyperpigmentation, vitiligo, or suspicious lesions  Neurological Examination Mental Status: Alert, oriented, thought content appropriate.  Speech fluent without evidence of aphasia.  Able to follow 3 step commands without difficulty. Cranial Nerves: II: Discs flat bilaterally; Visual fields grossly normal, pupils equal, round, reactive to light and accommodation III,IV, VI: ptosis not present, extra-ocular motions intact bilaterally V,VII: smile symmetric, facial light touch sensation normal bilaterally VIII: hearing normal bilaterally IX,X: gag reflex present XI: bilateral shoulder shrug XII: midline tongue extension Motor: Right : Upper extremity   5/5    Left:     Upper extremity   5/5  Lower extremity   5/5     Lower extremity   5/5 Tone and bulk:normal tone throughout; no atrophy noted Sensory:  Pinprick and light touch intact throughout, bilaterally Deep Tendon Reflexes: 2+ and symmetric throughout with absent AJ's bilaterally Plantars: Right: downgoing   Left: downgoing Cerebellar: normal finger-to-nose and normal heel-to-shin testing bilaterally Gait: not tested due to safety concerns   Laboratory Studies:  Basic Metabolic Panel:  Recent Labs Lab 11/15/14 2239 11/15/14 2247  NA 134* 140  K 4.0 4.1  CL 104 106  CO2 23  --   GLUCOSE 131* 128*  BUN 16 19  CREATININE 1.10 1.10  CALCIUM 8.5*  --     Liver Function Tests:  Recent Labs Lab 11/15/14 2239  AST 16  ALT 8*  ALKPHOS 97  BILITOT 0.5  PROT 6.3*  ALBUMIN 3.6   No results for input(s): LIPASE, AMYLASE in the last 168 hours. No results for input(s): AMMONIA in the last 168 hours.  CBC:  Recent Labs Lab 11/15/14 2239 11/15/14 2247  WBC 5.8  --   NEUTROABS 4.0  --   HGB 11.9* 13.6  HCT 38.7* 40.0  MCV 77.2*  --   PLT 209  --     Cardiac Enzymes: No results for input(s): CKTOTAL, CKMB, CKMBINDEX, TROPONINI in the last 168 hours.  BNP: Invalid input(s): POCBNP  CBG:  Recent  Labs Lab 11/15/14 2238  GLUCAP 129*    Microbiology: Results for orders placed or performed during the hospital encounter of 03/11/14  Surgical pcr screen     Status: None   Collection Time: 03/11/14 11:00 AM  Result Value Ref Range Status   MRSA, PCR NEGATIVE NEGATIVE Final   Staphylococcus aureus NEGATIVE NEGATIVE Final    Comment:        The Xpert SA Assay (FDA approved for NASAL specimens in patients over 68 years of age), is one component of a comprehensive surveillance program.  Test performance has been validated by [facility] for patients greater than or equal to 58 year old. It is not intended to diagnose infection nor to guide or monitor treatment.     Coagulation Studies:  Recent Labs  11/15/14 2239  LABPROT 14.3  INR 1.09    Urinalysis: No results for input(s): COLORURINE,  LABSPEC, PHURINE, GLUCOSEU, HGBUR, BILIRUBINUR, KETONESUR, PROTEINUR, UROBILINOGEN, NITRITE, LEUKOCYTESUR in the last 168 hours.  Invalid input(s): APPERANCEUR  Lipid Panel: No results found for: CHOL, TRIG, HDL, CHOLHDL, VLDL, LDLCALC  HgbA1C: No results found for: HGBA1C  Urine Drug Screen:  No results found for: LABOPIA, COCAINSCRNUR, LABBENZ, AMPHETMU, THCU, LABBARB  Alcohol Level: No results for input(s): ETH in the last 168 hours.  Other results: EKG: 71 bpm.  Imaging: Ct Head Wo Contrast  11/16/2014  CLINICAL DATA:  Dizziness, nausea and vomiting.  Onset yesterday. EXAM: CT HEAD WITHOUT CONTRAST TECHNIQUE: Contiguous axial images were obtained from the base of the skull through the vertex without intravenous contrast. COMPARISON:  01/24/2008 FINDINGS: No intracranial hemorrhage, mass effect, or midline shift. No hydrocephalus. The basilar cisterns are patent. No evidence of territorial infarct. No intracranial fluid collection. Age-related atrophy and chronic small vessel ischemic change. Atherosclerosis noted of the skullbase vasculature. Calvarium is intact. Included paranasal sinuses and mastoid air cells are well aerated. IMPRESSION: No acute intracranial abnormality. Electronically Signed   By: Jeb Levering M.D.   On: 11/16/2014 00:49    Assessment: 81 y.o. male presenting with vertigo.  Neurological examination is unremarkable.  Head CT personally reviewed and shows no scute changes.  Patient on ASA at home.  With vascular risk factors differential includes ischemia versus peripheral vertigo.    Stroke Risk Factors - diabetes mellitus and hypertension  Plan: 1. MRI of the brain without contrast.  Would not initiate stroke work up unless indicative of an acute ischemic event.   2. Meclizine prn 3. Continue ASA 4. Frequent neuro checks  Case discussed with Dr. Malva Limes, MD [facility] [phone] 11/16/2014, 1:54 AM

## 2014-11-16 NOTE — ED Provider Notes (Signed)
Seen by neuro and [facility] (see note) Felt appropriate for d/c if MRI negative MRI is negative Pt improved He can ambulate Will d/c with antivert   Ripley Fraise, MD 11/16/14 [phone]

## 2014-11-16 NOTE — ED Notes (Signed)
Pt has not urinated. States he has not been drinking much for the last couple of days. Gave patient some water and asked him to notify nurse if he gets urge to urinate so we can get ortho BPs and try standing to help with urination

## 2014-11-16 NOTE — Consult Note (Signed)
Consult Note  Patient Name: Joseph [name]     TKZ:[id number]    DOB: 10-14-1931    DOA: 11/15/2014 Referring physician: Dr. Christy Gentles Reason for consultation: Vertigo PCP: Simona Huh, MD      Chief Complaint: Vertigo  HPI: Joseph [name] is a 81 y.o. male with a past medical history significant for HTN who presents with vertigo.  The patient was in his usual state of health until two days ago when he woke up with severe vertigo.  He couldn't leave the house all day and his daughter reports that he was staggering and holding on to things and gagging and nauseated.  The sensation of room spinning was better with sitting very still and worse with movement.  There is no hearing loss or tinnitus.  They deny slurred speech, focal weakness, numbess, confusion, syncope or seizures.  The next day the vertigo was slightly better until after church, when he felt "swimmy headed" and gagging again, and called his daughter.  She brought him to the ER tonight.  In the ED, the patient was hemodynamically stable.  His electrolytes and blood count were normal.  A screening non-contrasted CT was unremarkable.  He had intermittent PACs on ECG, TRH were consulted.     Review of Systems:  Patient seen 1:48 AM on 11/16/2014. Pt complains of vertigo and nausea. All other systems negative except as just noted or noted in the history of present illness.  No Known Allergies  Prior to Admission medications   Medication Sig Start Date End Date Taking? Authorizing Provider  aspirin EC 81 MG tablet Take 81 mg by mouth daily.   Yes Historical Provider, MD  lisinopril (PRINIVIL,ZESTRIL) 20 MG tablet Take 20 mg by mouth every morning.   Yes Historical Provider, MD  loratadine (CLARITIN) 10 MG tablet Take 10 mg by mouth daily.   Yes Historical Provider, MD  metoprolol succinate (TOPROL-XL) 100 MG 24 hr tablet Take 100 mg by mouth daily with breakfast.    Yes Historical Provider, MD  pantoprazole (PROTONIX)  40 MG tablet Take 40 mg by mouth daily.   Yes Historical Provider, MD  promethazine (PHENERGAN) 12.5 MG tablet Take 12.5 mg by mouth every 6 (six) hours as needed for nausea or vomiting.   Yes Historical Provider, MD    Past Medical History  Diagnosis Date  . Hypertension   . Seasonal allergies   . History of kidney stones   . GERD (gastroesophageal reflux disease)   . Arthritis   . History of nonunion of fracture     LEFT PROXIMAL FEMUR  . Diabetes mellitus without complication (HCC)     DIET CONTROLLED  . Cancer ([place])     HX PROSTATE CANCER - MANY YRS AGO - ? RADIATION [place]    Past Surgical History  Procedure Laterality Date  . Femur im nail  01/18/2012    Procedure: INTRAMEDULLARY (IM) NAIL FEMORAL;  Surgeon: Gearlean Alf, MD;  Location: [place];  Service: Orthopedics;  Laterality: Left;  . Hiatal hernia repair  2011  . Hardware removal Left 01/13/2013    Procedure: HARDWARE REVISION LEFT HIP AND EXCHANGE OF COMPRESSION SCREW;  Surgeon: Gearlean Alf, MD;  [facility];  Service: Orthopedics;  Laterality: Left;  . Joint replacement  12/2011    BIL KNEE REPLACEMENTS  . Conversion to total hip Left 03/19/2014    Procedure: CONVERSION OF PREVIOUS HIP SURGERY TO LEFT TOTAL HIP ARTHROPLASTY;  Surgeon: Gearlean Alf, MD;  [facility];  Service: Orthopedics;  Laterality: Left;    Family history: family history includes Heart attack in his father and mother; Hypertension in his father and mother. There is no history of Colon cancer, Esophageal cancer, Rectal cancer, or Stomach cancer.  Brother with cancer and CVA.  Social History: Patient lives alone in [place].  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  He is a never smoker and quit alcohol 30 years ago.  He is independent with all IADLs and ADLs.       Physical Exam: BP 155/77 mmHg  Pulse 40  Temp(Src) 97.8 F (36.6 C)  Resp 16  SpO2 99% General appearance: Well-developed, adult male, alert  and in no distress.  Lying quietly and still in bed.   Eyes: Anicteric, conjunctiva pink, lids and lashes normal.     ENT: No nasal deformity, discharge, or epistaxis.  OP moist without lesions.   Lymph: No cervical, supraclavicular or axillary lymphadenopathy. Skin: Warm and dry.  No jaundice.  No suspicious rashes or lesions. Cardiac: RRR, nl S1-S2, no murmurs appreciated.  Capillary refill is brisk.  JVP normal.  No carotid bruits.  No LE edema.  Radial and DP pulses 2+ and symmetric. Respiratory: Normal respiratory rate and rhythm.  CTAB without rales or wheezes. Abdomen: Abdomen soft without rigidity.  No TTP. No ascites, distension.   MSK: No deformities or effusions. Neuro: Pupils are 3 mm and reactive to 2 mm.  Extraocular movements are intact, without nystagmus.  Cranial nerve 5 is within normal limits.  Cranial nerve 7 is symmetrical.  Cranial nerve 8 is within normal limits.  Cranial nerves 9 and 10 reveal equal palate elevation.  Cranial nerve 11 reveals sternocleidomastoid strong.  Cranial nerve 12 is midline.  Motor strength testing is 5/5 in the upper and lower extremities bilaterally with normal motor, tone and bulk. Heel to shin normal.  There is no pronator drift.  Finger-to-nose testing is within normal limits.  The patient is oriented to time, place and person.  Speech is fluent.  Naming is grossly intact.  Recall, recent and remote, as well as general fund of knowledge seem within normal limits.  Attention span and concentration are within normal limits. Psych: Behavior appropriate.  Affect normal.  No evidence of aural or visual hallucinations or delusions.       Labs on Admission:  The metabolic panel is notable for mild hyponatremia, normal potassium and renal function. The transaminases are normal. The complete blood count is notable for normal WBC and platelets.  Chronic stable microcytic anemia.   Radiological Exams on Admission: Personally reviewed: Ct Head Wo  Contrast 11/16/2014 Unremarkable.   EKG: Independently reviewed. Atrial bigeminy.  Normal ST segments.    Assessment/Plan  1. Vertigo:  This is new.  Ddx includes peripheral and central causes of vertigo.  Recommend Neurology consultation and MRI brain.  If positive, admit for CVA workup.  Have discussed with Dr. Doy Mince of Neurology.  If negative, patient seems to be having relief with meclizine 25 mg, would continue for 2-3 days and then stop, with outpatient follow up.    2. HTN:  Stable. Continue home Olmesartan and HCTZ  3. Microcytic anemia:  Would start Fe 325 mg twice daily and follow up with PCP    [facility] Pager [phone]

## 2014-11-16 NOTE — ED Notes (Signed)
Dr. Christy Gentles at bedside at this time.

## 2014-11-16 NOTE — ED Notes (Signed)
Ambulated pt after doing Orthostatics on pt. Pt stated he was not dizzy while walking and felt the medicine helped his nausea.

## 2015-02-03 DIAGNOSIS — E119 Type 2 diabetes mellitus without complications: Secondary | ICD-10-CM | POA: Diagnosis not present

## 2015-02-03 DIAGNOSIS — Z961 Presence of intraocular lens: Secondary | ICD-10-CM | POA: Diagnosis not present

## 2015-02-03 DIAGNOSIS — H40013 Open angle with borderline findings, low risk, bilateral: Secondary | ICD-10-CM | POA: Diagnosis not present

## 2015-04-08 DIAGNOSIS — R05 Cough: Secondary | ICD-10-CM | POA: Diagnosis not present

## 2015-06-18 DIAGNOSIS — I10 Essential (primary) hypertension: Secondary | ICD-10-CM | POA: Diagnosis not present

## 2015-06-18 DIAGNOSIS — I700 Atherosclerosis of aorta: Secondary | ICD-10-CM | POA: Diagnosis not present

## 2015-06-18 DIAGNOSIS — K219 Gastro-esophageal reflux disease without esophagitis: Secondary | ICD-10-CM | POA: Diagnosis not present

## 2015-06-18 DIAGNOSIS — E7800 Pure hypercholesterolemia, unspecified: Secondary | ICD-10-CM | POA: Diagnosis not present

## 2015-06-18 DIAGNOSIS — D692 Other nonthrombocytopenic purpura: Secondary | ICD-10-CM | POA: Diagnosis not present

## 2015-06-18 DIAGNOSIS — E119 Type 2 diabetes mellitus without complications: Secondary | ICD-10-CM | POA: Diagnosis not present

## 2015-06-29 DIAGNOSIS — N390 Urinary tract infection, site not specified: Secondary | ICD-10-CM | POA: Diagnosis not present

## 2015-06-29 DIAGNOSIS — R3915 Urgency of urination: Secondary | ICD-10-CM | POA: Diagnosis not present

## 2015-06-29 DIAGNOSIS — N200 Calculus of kidney: Secondary | ICD-10-CM | POA: Diagnosis not present

## 2015-06-29 DIAGNOSIS — Z0000 Encounter for general adult medical examination without abnormal findings: Secondary | ICD-10-CM | POA: Diagnosis not present

## 2015-07-15 DIAGNOSIS — N3020 Other chronic cystitis without hematuria: Secondary | ICD-10-CM | POA: Diagnosis not present

## 2015-07-23 DIAGNOSIS — C61 Malignant neoplasm of prostate: Secondary | ICD-10-CM | POA: Diagnosis not present

## 2015-07-23 DIAGNOSIS — N3000 Acute cystitis without hematuria: Secondary | ICD-10-CM | POA: Diagnosis not present

## 2015-11-08 ENCOUNTER — Ambulatory Visit (HOSPITAL_COMMUNITY)
Admission: EM | Admit: 2015-11-08 | Discharge: 2015-11-08 | Disposition: A | Discharge status: Home / Self Care | Payer: PPO | Attending: Internal Medicine | Admitting: Internal Medicine

## 2015-11-08 ENCOUNTER — Encounter (HOSPITAL_COMMUNITY): Payer: Self-pay | Admitting: Family Medicine

## 2015-11-08 DIAGNOSIS — S61411A Laceration without foreign body of right hand, initial encounter: Secondary | ICD-10-CM

## 2015-11-08 MED ORDER — TETANUS-DIPHTH-ACELL PERTUSSIS 5-2.5-18.5 LF-MCG/0.5 IM SUSP
0.5000 mL | Freq: Once | INTRAMUSCULAR | Status: AC
  Administered 2015-11-08: 0.5 mL via INTRAMUSCULAR

## 2015-11-08 MED ORDER — TETANUS-DIPHTH-ACELL PERTUSSIS 5-2.5-18.5 LF-MCG/0.5 IM SUSP
INTRAMUSCULAR | Status: AC
  Filled 2015-11-08: qty 0.5

## 2015-11-08 NOTE — ED Triage Notes (Signed)
Pt here for left hand injury after fall today. Pt has skin tear to left palm area. Denies any pain, no swelling,bleeding controlled. No wrist pain.
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
## 2015-11-08 NOTE — ED Notes (Signed)
Patient's left hand dressed with nonadherent gauze and bandaged with coban gauze.

## 2015-11-09 ENCOUNTER — Ambulatory Visit (HOSPITAL_COMMUNITY)
Admission: EM | Admit: 2015-11-09 | Discharge: 2015-11-09 | Disposition: A | Discharge status: Home / Self Care | Payer: PPO | Attending: Emergency Medicine | Admitting: Emergency Medicine

## 2015-11-09 ENCOUNTER — Encounter (HOSPITAL_COMMUNITY): Payer: Self-pay | Admitting: *Deleted

## 2015-11-09 DIAGNOSIS — S61412D Laceration without foreign body of left hand, subsequent encounter: Secondary | ICD-10-CM

## 2015-11-09 DIAGNOSIS — Z5189 Encounter for other specified aftercare: Secondary | ICD-10-CM

## 2015-11-09 NOTE — Discharge Instructions (Signed)
Do not get this wet. Change dressing once a day. Steri-Strips will come off in 5-10 days. This is normal. Return here or see your doctor for any signs of infection. Otherwise follow-up with your doctor as needed.

## 2015-11-09 NOTE — ED Provider Notes (Signed)
HPI  SUBJECTIVE:  Joseph [name] is a left-handed 82 y.o. male who presents for wound check. Patient was seen here yesterday for a skin tear left hand which was glued and Steri-Stripped. Patient states" that it bled all night" he tried applying pressure without improvement. The Steri-Strips have mostly come off. There are no other aggravating or alleviating factors. He does not have any pain, redness, swelling, purulent drainage. he has a past medical history including hypertension, diabetes. Patient states that he is very active and has difficulty not using his left hand. Marland Kitchen He is on aspirin 81 mg daily. He is not on any other anticoagulants or antiplatelets. PMD: Dr. Gaynelle Arabian    Past Medical History:  Diagnosis Date  . Arthritis   . Cancer ([place])    HX PROSTATE CANCER - MANY YRS AGO - ? RADIATION [place]  . Diabetes mellitus without complication (HCC)    DIET CONTROLLED  . GERD (gastroesophageal reflux disease)   . History of kidney stones   . History of nonunion of fracture    LEFT PROXIMAL FEMUR  . Hypertension   . Seasonal allergies     Past Surgical History:  Procedure Laterality Date  . CONVERSION TO TOTAL HIP Left 03/19/2014   Procedure: CONVERSION OF PREVIOUS HIP SURGERY TO LEFT TOTAL HIP ARTHROPLASTY;  Surgeon: Gearlean Alf, MD;  Location: [facility];  Service: Orthopedics;  Laterality: Left;  . FEMUR IM NAIL  01/18/2012   Procedure: INTRAMEDULLARY (IM) NAIL FEMORAL;  Surgeon: Gearlean Alf, MD;  Location: [facility];  Service: Orthopedics;  Laterality: Left;  . HARDWARE REMOVAL Left 01/13/2013   Procedure: HARDWARE REVISION LEFT HIP AND EXCHANGE OF COMPRESSION SCREW;  Surgeon: Gearlean Alf, MD;  Location: [facility];  Service: Orthopedics;  Laterality: Left;  . HIATAL HERNIA REPAIR  2011  . JOINT REPLACEMENT  12/2011   BIL KNEE REPLACEMENTS    Family History  Problem Relation Age of Onset  . Heart attack Mother   . Hypertension Mother   . Heart attack Father   .  Hypertension Father   . Colon cancer Neg Hx   . Esophageal cancer Neg Hx   . Rectal cancer Neg Hx   . Stomach cancer Neg Hx     Social History  Substance Use Topics  . Smoking status: Never Smoker  . Smokeless tobacco: Never Used  . Alcohol use No    No current facility-administered medications for this encounter.   Current Outpatient Prescriptions:  .  aspirin EC 81 MG tablet, Take 81 mg by mouth daily., Disp: , Rfl:  .  lisinopril (PRINIVIL,ZESTRIL) 20 MG tablet, Take 20 mg by mouth every morning., Disp: , Rfl:  .  loratadine (CLARITIN) 10 MG tablet, Take 10 mg by mouth daily., Disp: , Rfl:  .  meclizine (ANTIVERT) 25 MG tablet, Take 1 tablet (25 mg total) by mouth 2 (two) times daily as needed for dizziness., Disp: 15 tablet, Rfl: 0 .  metoprolol succinate (TOPROL-XL) 100 MG 24 hr tablet, Take 100 mg by mouth daily with breakfast. , Disp: , Rfl:  .  pantoprazole (PROTONIX) 40 MG tablet, Take 40 mg by mouth daily., Disp: , Rfl:  .  promethazine (PHENERGAN) 12.5 MG tablet, Take 12.5 mg by mouth every 6 (six) hours as needed for nausea or vomiting., Disp: , Rfl:   No Known Allergies   ROS  As noted in HPI.   Physical Exam  BP 120/70   Pulse 78   Temp  98.6 F (37 C) (Oral)   Resp 18   SpO2 100%   Constitutional: Well developed, well nourished, no acute distress Eyes:  EOMI, conjunctiva normal bilaterally HENT: Normocephalic, atraumatic,mucus membranes moist Respiratory: Normal inspiratory effort Cardiovascular: Normal rate GI: nondistended Skin: 3 cm curvilinear skin flap on the thenar eminence of the left hand. There is no evidence of infection. No bleeding. No tenderness, no purulent drainage, surrounding erythema, edema. Musculoskeletal: no deformities Neurologic: Alert & oriented x 3, no focal neuro deficits Psychiatric: Speech and behavior appropriate   ED Course   Medications - No data to display  Orders Placed This Encounter  Procedures  . Thumb spica     Standing Status:   Standing    Number of Occurrences:   1    Order Specific Question:   Laterality    Answer:   Left  . Apply steri strips    Standing Status:   Standing    Number of Occurrences:   1    No results found for this or any previous visit (from the past 24 hour(s)). No results found.  ED Clinical Impression  Visit for wound check  Skin tear of left hand without complication, subsequent encounter   ED Assessment/Plan  We'll have this re-Steri-Stripped by RN and placed in a thumb spica to immobilize it as patient is left handed and states that he is very active. Change dressing once a day, do not get it wet, advised strips will come off 5-10 days, return to clinic for follow-up with PMD for any signs of infection.   No orders of the defined types were placed in this encounter.   *This clinic note was created using Dragon dictation software. Therefore, there may be occasional mistakes despite careful proofreading.  ?   Melynda Ripple, MD 11/09/15 [phone]

## 2015-11-09 NOTE — ED Triage Notes (Signed)
Pt  Was   Seen  Yesterday  For  Laceration      To  His  l  Hand  He  Has  Steri   Strips    In place

## 2015-12-21 DIAGNOSIS — E119 Type 2 diabetes mellitus without complications: Secondary | ICD-10-CM | POA: Diagnosis not present

## 2015-12-21 DIAGNOSIS — I10 Essential (primary) hypertension: Secondary | ICD-10-CM | POA: Diagnosis not present

## 2015-12-21 DIAGNOSIS — K219 Gastro-esophageal reflux disease without esophagitis: Secondary | ICD-10-CM | POA: Diagnosis not present

## 2015-12-21 DIAGNOSIS — E7800 Pure hypercholesterolemia, unspecified: Secondary | ICD-10-CM | POA: Diagnosis not present

## 2015-12-21 DIAGNOSIS — Z1389 Encounter for screening for other disorder: Secondary | ICD-10-CM | POA: Diagnosis not present

## 2015-12-21 DIAGNOSIS — D692 Other nonthrombocytopenic purpura: Secondary | ICD-10-CM | POA: Diagnosis not present

## 2015-12-21 DIAGNOSIS — I700 Atherosclerosis of aorta: Secondary | ICD-10-CM | POA: Diagnosis not present

## 2015-12-21 DIAGNOSIS — J310 Chronic rhinitis: Secondary | ICD-10-CM | POA: Diagnosis not present

## 2016-02-22 DIAGNOSIS — R05 Cough: Secondary | ICD-10-CM | POA: Diagnosis not present

## 2016-04-03 ENCOUNTER — Ambulatory Visit (HOSPITAL_COMMUNITY)
Admission: EM | Admit: 2016-04-03 | Discharge: 2016-04-03 | Disposition: A | Discharge status: Home / Self Care | Payer: PPO | Attending: Family Medicine | Admitting: Family Medicine

## 2016-04-03 ENCOUNTER — Encounter (HOSPITAL_COMMUNITY): Payer: Self-pay | Admitting: Family Medicine

## 2016-04-03 DIAGNOSIS — S80812A Abrasion, left lower leg, initial encounter: Secondary | ICD-10-CM

## 2016-04-03 DIAGNOSIS — S41112A Laceration without foreign body of left upper arm, initial encounter: Secondary | ICD-10-CM | POA: Diagnosis not present

## 2016-04-03 DIAGNOSIS — S8012XA Contusion of left lower leg, initial encounter: Secondary | ICD-10-CM

## 2016-04-03 DIAGNOSIS — S40021A Contusion of right upper arm, initial encounter: Secondary | ICD-10-CM | POA: Diagnosis not present

## 2016-04-03 NOTE — ED Provider Notes (Signed)
[facility]    CSN: [id number] Arrival date & time: 04/03/16  1244     History   Chief Complaint Chief Complaint  Patient presents with  . Arm Injury    HPI Joseph [name] is a 83 y.o. male.   Pt here for skin tears to left forearm and left leg. sts that he scraped it on a tractor today.       Past Medical History:  Diagnosis Date  . Arthritis   . Cancer ([place])    HX PROSTATE CANCER - MANY YRS AGO - ? [place]IATION [place]  . Diabetes mellitus without complication (HCC)    DIET CONTROLLED  . GERD (gastroesophageal reflux disease)   . History of kidney stones   . History of nonunion of fracture    LEFT PROXIMAL FEMUR  . Hypertension   . Seasonal allergies     Patient Active Problem List   Diagnosis Date Noted  . Vertigo   . Essential hypertension   . Iron deficiency anemia   . Fracture, proximal femur ([place]) 03/19/2014  . Painful orthopaedic hardware ([place]) 01/13/2013  . Fall at home 01/18/2012  . Hip fracture, left ([place]) 01/18/2012  . Femur fracture, left ([place]) 01/18/2012  . ESOPHAGEAL MOTILITY DISORDER 11/16/2009  . PERSONAL HX PROSTATE CANCER 11/16/2009  . DIVERTICULITIS-COLON 08/30/2009  . CONSTIPATION 08/30/2009  . HYPERTENSION 07/28/2009  . GERD 07/28/2009  . DIVERTICULOSIS-COLON 07/28/2009  . ABDOMINAL PAIN, LEFT UPPER QUADRANT 07/28/2009  . ABDOMINAL PAIN, LEFT LOWER QUADRANT 07/28/2009  . PERSONAL HX COLONIC POLYPS 07/28/2009  . ACHALASIA 03/09/2009  . RECTAL BLEEDING 03/09/2009  . NONSPECIFIC ABN FINDING RAD & OTH EXAM GI TRACT 02/25/2009  . DYSPHAGIA UNSPECIFIED 02/24/2009    Past Surgical History:  Procedure Laterality Date  . CONVERSION TO TOTAL HIP Left 03/19/2014   Procedure: CONVERSION OF PREVIOUS HIP SURGERY TO LEFT TOTAL HIP ARTHROPLASTY;  Surgeon: Gearlean Alf, MD;  [facility];  Service: Orthopedics;  Laterality: Left;  . FEMUR IM NAIL  01/18/2012   Procedure: INTRAMEDULLARY (IM) NAIL FEMORAL;  Surgeon: Gearlean Alf,  MD;  Location: [facility];  Service: Orthopedics;  Laterality: Left;  . HARDWARE REMOVAL Left 01/13/2013   Procedure: HARDWARE REVISION LEFT HIP AND EXCHANGE OF COMPRESSION SCREW;  Surgeon: Gearlean Alf, MD;  [facility];  Service: Orthopedics;  Laterality: Left;  . HIATAL HERNIA REPAIR  2011  . JOINT REPLACEMENT  12/2011   BIL KNEE REPLACEMENTS       Home Medications    Prior to Admission medications   Medication Sig Start Date End Date Taking? Authorizing Provider  aspirin EC 81 MG tablet Take 81 mg by mouth daily.    Historical Provider, MD  lisinopril (PRINIVIL,ZESTRIL) 20 MG tablet Take 20 mg by mouth every morning.    Historical Provider, MD  loratadine (CLARITIN) 10 MG tablet Take 10 mg by mouth daily.    Historical Provider, MD  meclizine (ANTIVERT) 25 MG tablet Take 1 tablet (25 mg total) by mouth 2 (two) times daily as needed for dizziness. 11/16/14   Ripley Fraise, MD  metoprolol succinate (TOPROL-XL) 100 MG 24 hr tablet Take 100 mg by mouth daily with breakfast.     Historical Provider, MD  pantoprazole (PROTONIX) 40 MG tablet Take 40 mg by mouth daily.    Historical Provider, MD  promethazine (PHENERGAN) 12.5 MG tablet Take 12.5 mg by mouth every 6 (six) hours as needed for nausea or vomiting.    Historical Provider,  MD    Family History Family History  Problem Relation Age of Onset  . Heart attack Mother   . Hypertension Mother   . Heart attack Father   . Hypertension Father   . Colon cancer Neg Hx   . Esophageal cancer Neg Hx   . Rectal cancer Neg Hx   . Stomach cancer Neg Hx     Social History Social History  Substance Use Topics  . Smoking status: Never Smoker  . Smokeless tobacco: Never Used  . Alcohol use No     Allergies   Patient has no known allergies.   Review of Systems Review of Systems  Constitutional: Negative.   Skin: Positive for wound.     Physical Exam Triage Vital Signs ED Triage Vitals [04/03/16 1328]  Enc Vitals Group       BP 168/84     Pulse Rate 74     Resp 18     Temp 98.3 F (36.8 C)     Temp src      SpO2 96 %     Weight      Height      Head Circumference      Peak Flow      Pain Score      Pain Loc      Pain Edu?      Excl. in [place]?    No data found.   Updated Vital Signs BP 168/84   Pulse 74   Temp 98.3 F (36.8 C)   Resp 18   SpO2 96%   Visual Acuity Right Eye Distance:   Left Eye Distance:   Bilateral Distance:    Right Eye Near:   Left Eye Near:    Bilateral Near:     Physical Exam  Constitutional: He is oriented to person, place, and time. He appears well-developed and well-nourished.  HENT:  Right Ear: External ear normal.  Left Ear: External ear normal.  Eyes: Conjunctivae are normal. Pupils are equal, round, and reactive to light.  Neck: Normal range of motion. Neck supple.  Pulmonary/Chest: Effort normal.  Musculoskeletal: Normal range of motion.  Neurological: He is alert and oriented to person, place, and time.  Skin: Skin is warm.  Patient has 2 superficial wounds on his left forearm, one L-shaped measuring about 3 cm and the other 2 x 2 centimeter skin avulsion more distally on the radial aspect of the volar forearm  Nursing note and vitals reviewed.  Patient also has subcutaneous bruising on the right triceps area and left anterior shin  UC Treatments / Results  Labs (all labs ordered are listed, but only abnormal results are displayed) Labs Reviewed - No data to display  EKG  EKG Interpretation None       Radiology No results found.  Procedures .Marland KitchenLaceration Repair Date/Time: 04/03/2016 2:03 PM Performed by: Robyn Haber Authorized by: Robyn Haber   Consent:    Consent obtained:  Verbal   Consent given by:  Patient and spouse   Risks discussed:  Infection and pain   Alternatives discussed:  No treatment Laceration details:    Location:  Shoulder/arm   Shoulder/arm location:  L lower arm Repair type:    Repair type:   Simple Exploration:    Hemostasis achieved with:  Direct pressure   Contaminated: no   Treatment:    Visualized foreign bodies/material removed: no   Skin repair:    Repair method:  Tissue adhesive and Steri-Strips Approximation:  Approximation:  Loose   Vermilion border: well-aligned   Post-procedure details:    Dressing:  Sterile dressing   Patient tolerance of procedure:  Tolerated well, no immediate complications   (including critical care time)  Medications Ordered in UC Medications - No data to display   Initial Impression / Assessment and Plan / UC Course  I have reviewed the triage vital signs and the nursing notes.  Pertinent labs & imaging results that were available during my care of the patient were reviewed by me and considered in my medical decision making (see chart for details).     Final Clinical Impressions(s) / UC Diagnoses   Final diagnoses:  Laceration of left upper arm without complication, initial encounter  Contusion of right arm, initial encounter  Abrasion of left leg, initial encounter  Contusion of left leg, initial encounter    New Prescriptions New Prescriptions   No medications on file     Robyn Haber, MD 04/03/16 1408

## 2016-04-03 NOTE — ED Triage Notes (Signed)
Pt here for skin tears to left FA and left leg. sts that he scraped it on a tractor today.

## 2016-04-11 DIAGNOSIS — S8012XA Contusion of left lower leg, initial encounter: Secondary | ICD-10-CM | POA: Diagnosis not present

## 2016-04-11 DIAGNOSIS — R0789 Other chest pain: Secondary | ICD-10-CM | POA: Diagnosis not present

## 2016-04-11 DIAGNOSIS — S41119A Laceration without foreign body of unspecified upper arm, initial encounter: Secondary | ICD-10-CM | POA: Diagnosis not present

## 2016-06-22 DIAGNOSIS — E119 Type 2 diabetes mellitus without complications: Secondary | ICD-10-CM | POA: Diagnosis not present

## 2016-06-22 DIAGNOSIS — D692 Other nonthrombocytopenic purpura: Secondary | ICD-10-CM | POA: Diagnosis not present

## 2016-06-22 DIAGNOSIS — J310 Chronic rhinitis: Secondary | ICD-10-CM | POA: Diagnosis not present

## 2016-06-22 DIAGNOSIS — I700 Atherosclerosis of aorta: Secondary | ICD-10-CM | POA: Diagnosis not present

## 2016-06-22 DIAGNOSIS — K219 Gastro-esophageal reflux disease without esophagitis: Secondary | ICD-10-CM | POA: Diagnosis not present

## 2016-06-22 DIAGNOSIS — E7800 Pure hypercholesterolemia, unspecified: Secondary | ICD-10-CM | POA: Diagnosis not present

## 2016-06-22 DIAGNOSIS — I10 Essential (primary) hypertension: Secondary | ICD-10-CM | POA: Diagnosis not present

## 2016-10-03 DIAGNOSIS — Z8546 Personal history of malignant neoplasm of prostate: Secondary | ICD-10-CM | POA: Diagnosis not present

## 2016-10-03 DIAGNOSIS — R8271 Bacteriuria: Secondary | ICD-10-CM | POA: Diagnosis not present

## 2016-12-25 DIAGNOSIS — I10 Essential (primary) hypertension: Secondary | ICD-10-CM | POA: Diagnosis not present

## 2016-12-25 DIAGNOSIS — J310 Chronic rhinitis: Secondary | ICD-10-CM | POA: Diagnosis not present

## 2016-12-25 DIAGNOSIS — I700 Atherosclerosis of aorta: Secondary | ICD-10-CM | POA: Diagnosis not present

## 2016-12-25 DIAGNOSIS — D692 Other nonthrombocytopenic purpura: Secondary | ICD-10-CM | POA: Diagnosis not present

## 2016-12-25 DIAGNOSIS — Z0000 Encounter for general adult medical examination without abnormal findings: Secondary | ICD-10-CM | POA: Diagnosis not present

## 2016-12-25 DIAGNOSIS — E119 Type 2 diabetes mellitus without complications: Secondary | ICD-10-CM | POA: Diagnosis not present

## 2016-12-25 DIAGNOSIS — E7800 Pure hypercholesterolemia, unspecified: Secondary | ICD-10-CM | POA: Diagnosis not present

## 2016-12-25 DIAGNOSIS — K219 Gastro-esophageal reflux disease without esophagitis: Secondary | ICD-10-CM | POA: Diagnosis not present

## 2017-02-23 ENCOUNTER — Other Ambulatory Visit: Payer: Self-pay | Admitting: Family Medicine

## 2017-02-23 ENCOUNTER — Ambulatory Visit
Admission: RE | Admit: 2017-02-23 | Discharge: 2017-02-23 | Disposition: A | Discharge status: Home / Self Care | Payer: PPO | Source: Ambulatory Visit | Attending: Family Medicine | Admitting: Family Medicine

## 2017-02-23 DIAGNOSIS — J069 Acute upper respiratory infection, unspecified: Secondary | ICD-10-CM | POA: Diagnosis not present

## 2017-02-23 DIAGNOSIS — R9389 Abnormal findings on diagnostic imaging of other specified body structures: Secondary | ICD-10-CM | POA: Diagnosis not present

## 2017-02-23 DIAGNOSIS — R059 Cough, unspecified: Secondary | ICD-10-CM

## 2017-02-23 DIAGNOSIS — R05 Cough: Secondary | ICD-10-CM

## 2017-02-27 ENCOUNTER — Ambulatory Visit
Admission: RE | Admit: 2017-02-27 | Discharge: 2017-02-27 | Disposition: A | Discharge status: Home / Self Care | Payer: PPO | Source: Ambulatory Visit | Attending: Family Medicine | Admitting: Family Medicine

## 2017-02-27 DIAGNOSIS — R05 Cough: Secondary | ICD-10-CM

## 2017-02-27 DIAGNOSIS — R059 Cough, unspecified: Secondary | ICD-10-CM

## 2017-02-27 DIAGNOSIS — J439 Emphysema, unspecified: Secondary | ICD-10-CM | POA: Diagnosis not present

## 2017-02-27 DIAGNOSIS — R9389 Abnormal findings on diagnostic imaging of other specified body structures: Secondary | ICD-10-CM

## 2017-02-27 MED ORDER — IOPAMIDOL (ISOVUE-300) INJECTION 61%
75.0000 mL | Freq: Once | INTRAVENOUS | Status: AC | PRN
  Administered 2017-02-27: 75 mL via INTRAVENOUS

## 2017-02-28 ENCOUNTER — Other Ambulatory Visit: Payer: PPO

## 2017-03-01 ENCOUNTER — Other Ambulatory Visit (HOSPITAL_COMMUNITY): Payer: Self-pay | Admitting: Family Medicine

## 2017-03-01 DIAGNOSIS — R911 Solitary pulmonary nodule: Secondary | ICD-10-CM

## 2017-03-01 DIAGNOSIS — R9389 Abnormal findings on diagnostic imaging of other specified body structures: Secondary | ICD-10-CM

## 2017-03-05 DIAGNOSIS — M479 Spondylosis, unspecified: Secondary | ICD-10-CM | POA: Diagnosis not present

## 2017-03-05 DIAGNOSIS — I700 Atherosclerosis of aorta: Secondary | ICD-10-CM | POA: Diagnosis not present

## 2017-03-05 DIAGNOSIS — K449 Diaphragmatic hernia without obstruction or gangrene: Secondary | ICD-10-CM | POA: Diagnosis not present

## 2017-03-05 DIAGNOSIS — C3490 Malignant neoplasm of unspecified part of unspecified bronchus or lung: Secondary | ICD-10-CM | POA: Diagnosis not present

## 2017-03-05 DIAGNOSIS — R911 Solitary pulmonary nodule: Secondary | ICD-10-CM | POA: Diagnosis not present

## 2017-03-06 ENCOUNTER — Telehealth: Payer: Self-pay | Admitting: *Deleted

## 2017-03-06 NOTE — Telephone Encounter (Signed)
Oncology Nurse Navigator Documentation  Oncology Nurse Navigator Flowsheets 03/06/2017  Navigator Location [facility]  Referral date to RadOnc/MedOnc 03/05/2017  Navigator Encounter Type Telephone/I received referral on Joseph [name]. I gave him an appt for [place] on 03/15/17.  Family would like an earlier appt. I contacted [facility] for a possible earlier appt. I updated family  Telephone Outgoing Call  Treatment Phase Abnormal Scans  Barriers/Navigation Needs Coordination of Care;Education  Education Other  Interventions Education;Coordination of Care  Coordination of Care Appts  Acuity Level 1  Time Spent with Patient 30

## 2017-03-14 ENCOUNTER — Telehealth: Payer: Self-pay | Admitting: Internal Medicine

## 2017-03-14 ENCOUNTER — Encounter (HOSPITAL_COMMUNITY): Payer: PPO

## 2017-03-14 NOTE — Telephone Encounter (Signed)
Spoke with daughter [name] to confirm arrival time for tomorrows [place]

## 2017-03-15 ENCOUNTER — Other Ambulatory Visit: Payer: Self-pay | Admitting: *Deleted

## 2017-03-15 ENCOUNTER — Encounter: Payer: Self-pay | Admitting: Thoracic Surgery (Cardiothoracic Vascular Surgery)

## 2017-03-15 ENCOUNTER — Institutional Professional Consult (permissible substitution) (INDEPENDENT_AMBULATORY_CARE_PROVIDER_SITE_OTHER): Payer: PPO | Admitting: Thoracic Surgery (Cardiothoracic Vascular Surgery)

## 2017-03-15 ENCOUNTER — Other Ambulatory Visit: Payer: Self-pay

## 2017-03-15 ENCOUNTER — Ambulatory Visit: Payer: PPO | Attending: Thoracic Surgery (Cardiothoracic Vascular Surgery) | Admitting: Physical Therapy

## 2017-03-15 VITALS — BP 167/78 | HR 79 | Temp 97.6°F | Resp 20 | Wt 180.6 lb

## 2017-03-15 DIAGNOSIS — R2681 Unsteadiness on feet: Secondary | ICD-10-CM | POA: Insufficient documentation

## 2017-03-15 DIAGNOSIS — R2689 Other abnormalities of gait and mobility: Secondary | ICD-10-CM | POA: Insufficient documentation

## 2017-03-15 DIAGNOSIS — D491 Neoplasm of unspecified behavior of respiratory system: Secondary | ICD-10-CM | POA: Diagnosis not present

## 2017-03-15 DIAGNOSIS — R911 Solitary pulmonary nodule: Secondary | ICD-10-CM | POA: Diagnosis not present

## 2017-03-15 DIAGNOSIS — IMO0001 Reserved for inherently not codable concepts without codable children: Secondary | ICD-10-CM

## 2017-03-15 NOTE — H&P (View-Only) (Signed)
PCP is Gaynelle Arabian, MD Referring Provider is Gaynelle Arabian, MD  No chief complaint on file.   HPI: Joseph [name] is sent to [facility] for consultation regarding a left upper lobe mass.  Joseph [name] is an 84 yo man with a past history of prostate cancer, achalasia, GERD, type 2 diabetes without complication, hypertension and arthritis. He was in his usual state of health until 3 weeks ago. He became ill with cough, left sided pleuritic CP and general malaise. He went to Dr. Marisue Humble. A CXR showed a possible LUL mass. A CT was done which revealed a 2.7 x 2.5 x 2.1 cm spiculated nodule. A PET CT showed mild hypermetabolic activity with an SUV of 3.27. There was no hypermetabolic hilar or mediastinal adenopathy or distant metastases.  He is a lifelong nonsmoker. He still works part time as a Development worker, international aid. He denies chest pain, pressure or tightness at rest or with exertion. He denies shortness of breath, fever, chills. He has lost about 5 pounds over the past month. His cough has improved as has his CP.   Zubrod Score: At the time of surgery this patient's most appropriate activity status/level should be described as: [x]     0    Normal activity, no symptoms []     1    Restricted in physical strenuous activity but ambulatory, able to do out light work []     2    Ambulatory and capable of self care, unable to do work activities, up and about >50 % of waking hours                              []     3    Only limited self care, in bed greater than 50% of waking hours []     4    Completely disabled, no self care, confined to bed or chair []     5    Moribund  Past Medical History:  Diagnosis Date  . Arthritis   . Cancer ([place])    HX PROSTATE CANCER - MANY YRS AGO - ? RADIATION [place]  . Diabetes mellitus without complication (HCC)    DIET CONTROLLED  . GERD (gastroesophageal reflux disease)   . History of kidney stones   . History of nonunion of fracture    LEFT PROXIMAL FEMUR  . Hypertension   .  Seasonal allergies     Past Surgical History:  Procedure Laterality Date  . CONVERSION TO TOTAL HIP Left 03/19/2014   Procedure: CONVERSION OF PREVIOUS HIP SURGERY TO LEFT TOTAL HIP ARTHROPLASTY;  Surgeon: Gearlean Alf, MD;  [facility];  Service: Orthopedics;  Laterality: Left;  . FEMUR IM NAIL  01/18/2012   Procedure: INTRAMEDULLARY (IM) NAIL FEMORAL;  Surgeon: Gearlean Alf, MD;  Location: [facility];  Service: Orthopedics;  Laterality: Left;  . HARDWARE REMOVAL Left 01/13/2013   Procedure: HARDWARE REVISION LEFT HIP AND EXCHANGE OF COMPRESSION SCREW;  Surgeon: Gearlean Alf, MD;  [facility];  Service: Orthopedics;  Laterality: Left;  . HIATAL HERNIA REPAIR  2011  . JOINT REPLACEMENT  12/2011   BIL KNEE REPLACEMENTS    Family History  Problem Relation Age of Onset  . Heart attack Mother   . Hypertension Mother   . Heart attack Father   . Hypertension Father   . Colon cancer Neg Hx   . Esophageal cancer Neg Hx   . Rectal cancer Neg Hx   .  Stomach cancer Neg Hx     Social History Social History   Tobacco Use  . Smoking status: Never Smoker  . Smokeless tobacco: Never Used  Substance Use Topics  . Alcohol use: No  . Drug use: No    Current Outpatient Medications  Medication Sig Dispense Refill  . aspirin EC 81 MG tablet Take 81 mg by mouth daily.    Marland Kitchen lisinopril (PRINIVIL,ZESTRIL) 20 MG tablet Take 20 mg by mouth every morning.    . loratadine (CLARITIN) 10 MG tablet Take 10 mg by mouth daily.    . meclizine (ANTIVERT) 25 MG tablet Take 1 tablet (25 mg total) by mouth 2 (two) times daily as needed for dizziness. 15 tablet 0  . metoprolol succinate (TOPROL-XL) 100 MG 24 hr tablet Take 100 mg by mouth daily with breakfast.     . pantoprazole (PROTONIX) 40 MG tablet Take 40 mg by mouth daily.    . promethazine (PHENERGAN) 12.5 MG tablet Take 12.5 mg by mouth every 6 (six) hours as needed for nausea or vomiting.     No current facility-administered medications  for this visit.     No Known Allergies  Review of Systems  Constitutional: Positive for activity change, fatigue (x 3 weeks, getting better) and unexpected weight change (5 pounds in 1 month).  HENT: Negative for trouble swallowing and voice change.   Eyes: Negative for visual disturbance.  Respiratory: Positive for cough. Negative for shortness of breath and wheezing.   Cardiovascular: Negative for chest pain and leg swelling.  Gastrointestinal: Negative for abdominal distention and abdominal pain.  Genitourinary: Positive for frequency. Negative for difficulty urinating and dysuria.  Neurological: Negative for syncope and weakness.  Hematological: Negative for adenopathy. Does not bruise/bleed easily.    BP (!) 167/78   Pulse 79   Temp 97.6 F (36.4 C)   Resp 20   Wt 180 lb 9.6 oz (81.9 kg)   SpO2 97%   BMI 23.83 kg/m  Physical Exam  Constitutional: He is oriented to person, place, and time. He appears well-developed and well-nourished. No distress.  HENT:  Head: Normocephalic and atraumatic.  Mouth/Throat: No oropharyngeal exudate.  Eyes: Conjunctivae and EOM are normal.  Neck: Neck supple. No thyromegaly present.  Cardiovascular: Normal rate and regular rhythm. Exam reveals no gallop and no friction rub.  No murmur heard. Pulmonary/Chest: Effort normal and breath sounds normal. No respiratory distress. He has no wheezes. He has no rales.  Abdominal: Soft. He exhibits no distension. There is no tenderness.  Musculoskeletal: He exhibits no edema.  Lymphadenopathy:    He has no cervical adenopathy.  Neurological: He is alert and oriented to person, place, and time. No cranial nerve deficit. He exhibits normal muscle tone.  Skin: Skin is warm and dry.  Vitals reviewed.    Diagnostic Tests: CT CHEST WITH CONTRAST  TECHNIQUE: Multidetector CT imaging of the chest was performed during intravenous contrast administration.  CONTRAST:  46mL ISOVUE-300 IOPAMIDOL  (ISOVUE-300) INJECTION 61%  COMPARISON:  Chest x-ray 02/23/2017  FINDINGS: Cardiovascular: The heart is normal in size. No pericardial effusion. There is tortuosity, ectasia and calcification of the thoracic aorta but no dissection. Dense and extensive three-vessel coronary artery calcifications are noted.  Mediastinum/Nodes: Small scattered mediastinal and hilar lymph nodes. 7 mm pretracheal lymph node on image number 44. 3 small, 7 mm aorticopulmonary window lymph nodes. 8.5 mm right hilar lymph node on image number 50. 8.5 mm infrahilar lymph node on the left on image  number 57. 8.5 mm subcarinal lymph node on image number 55.  There is a moderate-sized hiatal hernia and surgical changes consistent with a slipped Nissen fundoplication.  Lungs/Pleura: Underlying emphysematous changes. Irregular lobulated and spiculated 2.7 x 2.5 x 2.1 cm left upper lobe pulmonary lesion worrisome for neoplasm.  No pulmonary nodules are identified.  No pleural effusion.  Upper Abdomen: No significant upper abdominal findings. No hepatic or adrenal gland lesions. There is a large upper pole right renal cyst noted. Moderate to advanced atherosclerotic calcifications involving the upper abdominal aorta.  Musculoskeletal: Postoperative changes involving the left hemithorax suspect prior thoracotomy. No bone lesions. No supraclavicular or axillary adenopathy. The thyroid gland is grossly normal.  IMPRESSION: 1. 2.7 x 2.5 x 2.1 cm left upper lobe pulmonary lesion worrisome for primary lung neoplasm. Recommend PET-CT for further evaluation and referral to multi-disciplinary thoracic oncology clinic [facility]). 2. Mediastinal and hilar lymph nodes are indeterminate. 3. Advanced atherosclerotic calcifications involving the thoracic and abdominal aorta and coronary arteries. 4. Slipped Nissen fundoplication. 5. Mild underlying emphysematous changes.  Aortic Atherosclerosis (ICD10-I70.0) and  Emphysema (ICD10-J43.9).   Electronically Signed   By: Marijo Sanes M.D.   On: 02/28/2017 09:38   I personally reviewed the CT images and concur with the findings noted above. I also reviewed the PET images from [facility] which are accessible from our PACS system. Nodule is hypermetabolic with SUV 5.49. No evidence of metastasis  Impression: Joseph [name] is an 84 yo man with a history of achalasia, s/p Heller myotomy, prostate cancer, hypertension and arthritis. He was doing well until about 3 weeks ago when he developed a cough and general malaise. Workup showed a left upper lobe nodule that is highly suspicious for a new primary bronchogenic carcinoma. Granulomatous disease is also in the differential.  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 Given those factors and the recent illness raises suspicion for an infectious etiology, although cancer is still the most likely. Because of that I recommended we proceed with a navigational bronchoscopy for biopsy. Given the size of the lesion I think we have about a 90% chance of getting a definitive answer. If definitely granulomatous disease would treat medically. If indeterminate or positive for cancer the next step would be a left upper lobectomy.  I discussed navigational bronchoscopy with Joseph [name] and his family. They are aware of the general nature of the procedure and the plan to do it under general anesthesia. I informed them of the indications, risks, benefits and alternatives. They understand the risks include those associated with general anesthesia including death, MI, DVT, PE, stroke, as well as procedure specific risks such as bleeding, pneumothorax and failure to make a diagnosis.  He accepts the risks and agrees to proceed.  Plan: Navigational bronchoscopy on Monday 03/19/2017  Melrose Nakayama, MD [facility] [phone]

## 2017-03-15 NOTE — Therapy (Signed)
[place], [place], [address] Phone: [phone]   Fax:  [phone]  Physical Therapy Evaluation  Patient Details  Name: Joseph [name] MRN: [id number] Date of Birth: 01-Aug-1931 Referring Provider: Dr. Modesto Charon   Encounter Date: 03/15/2017  PT End of Session - 03/15/17 1730    Visit Number  1    Number of Visits  1    PT Start Time  1625    PT Stop Time  1648    PT Time Calculation (min)  23 min    Activity Tolerance  Patient tolerated treatment well    Behavior During Therapy  [facility] for tasks assessed/performed       Past Medical History:  Diagnosis Date  . Arthritis   . Cancer ([place])    HX PROSTATE CANCER - MANY YRS AGO - ? RADIATION [place]  . Diabetes mellitus without complication (HCC)    DIET CONTROLLED  . GERD (gastroesophageal reflux disease)   . History of kidney stones   . History of nonunion of fracture    LEFT PROXIMAL FEMUR  . Hypertension   . Seasonal allergies     Past Surgical History:  Procedure Laterality Date  . CONVERSION TO TOTAL HIP Left 03/19/2014   Procedure: CONVERSION OF PREVIOUS HIP SURGERY TO LEFT TOTAL HIP ARTHROPLASTY;  Surgeon: Gearlean Alf, MD;  [facility];  Service: Orthopedics;  Laterality: Left;  . FEMUR IM NAIL  01/18/2012   Procedure: INTRAMEDULLARY (IM) NAIL FEMORAL;  Surgeon: Gearlean Alf, MD;  Location: [place];  Service: Orthopedics;  Laterality: Left;  . HARDWARE REMOVAL Left 01/13/2013   Procedure: HARDWARE REVISION LEFT HIP AND EXCHANGE OF COMPRESSION SCREW;  Surgeon: Gearlean Alf, MD;  [facility];  Service: Orthopedics;  Laterality: Left;  . HIATAL HERNIA REPAIR  2011  . JOINT REPLACEMENT  12/2011   BIL KNEE REPLACEMENTS    There were no vitals filed for this visit.   Subjective Assessment - 03/15/17 1718    Subjective  Pt.'s family:  They're not sure if it's cancer or if it's something like a fungus.    Patient is  accompained by:  Family member son and daughter    Pertinent History  Pt. presented with cough; workup showed a left lung mass.  Has had chest x-ray, CT, PET; now will have biopsy by thoracic surgeon. Mass is 3.4 cm.; no nodes were positive on scans. 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    Patient Stated Goals  get info from all lung clinic providers    Currently in Pain?  No/denies         [facility] PT Assessment - 03/15/17 0001      Assessment   Medical Diagnosis  left lung 3.4 cm. mass; no pathology on it yet    Referring Provider  Dr. Modesto Charon    Onset Date/Surgical Date  02/23/17    Prior Therapy  none      Precautions   Precautions  Other (comment)    Precaution Comments  HOH (bilat. hearing aids)      Restrictions   Weight Bearing Restrictions  No      Balance Screen   Has the patient fallen in the past 6 months  No    Has the patient had a decrease in activity level because of  a fear of falling?   No but he reports being cautious    Is the patient reluctant to leave their home because of a fear of falling?   No      Home Environment   Living Environment  Private residence    Living Arrangements  Alone    Type of [place]  One level      Prior Function   Level of Independence  Independent    Vocation  Self employed    Engineer, petroleum: drives tractors, some manual, does all tasks    Leisure  no exercise besides his work      Charity fundraiser Status  Within Functional Limits for tasks assessed      Observation/Other Assessments   Observations  older gentleman with generally good mobility      Coordination   Gross Motor Movements are Fluid and Coordinated  Yes      Functional Tests   Functional tests  Sit to Stand      Sit to Stand   Comments  6 times in 30  seconds, then he reported being "give out"  below average for age      Posture/Postural Control   Posture/Postural Control  Postural limitations    Postural Limitations  Forward head      ROM / Strength   AROM / PROM / Strength  AROM      AROM   Overall AROM Comments  standing trunk AROM WFL all motions except extension limited 50% pt. reported feeling "swimmy" after this      Ambulation/Gait   Ambulation/Gait  Yes    Ambulation/Gait Assistance  7: Independent      Balance   Balance Assessed  Yes      Dynamic Standing Balance   Dynamic Standing - Comments  reaches forward 10 inches in standing, below average for age             Objective measurements completed on examination: See above findings.              PT Education - 03/15/17 1729    Education provided  Yes    Education Details  walking, CURE article on staying active, "Why exercise?" flyer; posture, breathing, PT info, cough splinting    Person(s) Educated  Patient;Child(ren)    Methods  Handout    Comprehension  Verbalized understanding            Lung Clinic Goals - 03/15/17 1738      Patient will be able to verbalize understanding of the benefit of exercise to decrease fatigue.   Status  Achieved      Patient will be able to verbalize the importance of posture.   Status  Achieved      Patient will be able to demonstrate diaphragmatic breathing for improved lung function.   Status  Achieved      Patient will be able to verbalize understanding of the role of physical therapy to prevent functional decline and who to contact if physical therapy is needed.   Status  Achieved           Plan - 03/15/17 1731    Clinical Impression Statement  Pleasant gentleman accompanied by two adult children today. He has a 3.4 cm. left lung mass, but it has not been biopsied yet, so that will be done by nav-bronchoscopy. If the diagnosis is cancer, he may have resection. 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 Nonetheless he scored below average on 30 second sit to stand and on standing forward reach.     History and Personal Factors relevant to plan of care:  arthritis, h/o left THA, is on meclazine so likely has vertigo    Clinical Presentation  Evolving    Clinical Presentation due to:  being worked up for possible lung cancer    Clinical Decision Making  Moderate    Rehab Potential  Good    PT Frequency  One time visit    PT Treatment/Interventions  Patient/family education    PT Next Visit Plan  No follow-up planned; this may depend on his diagnosis after biopsy    PT Home Exercise Plan  walking, breathing exercise    Consulted and Agree with Plan of Care  Patient;Family member/caregiver       Patient will benefit from skilled therapeutic intervention in order to improve the following deficits and impairments:  Decreased balance, Decreased mobility, Decreased range of motion  Visit Diagnosis: Lung neoplasm - Plan: PT plan of care cert/re-cert  Unsteadiness on feet - Plan: PT plan of care cert/re-cert  Other abnormalities of gait and mobility - Plan: PT plan of care cert/re-cert     Problem List Patient Active Problem List   Diagnosis Date Noted  . Vertigo   . Essential hypertension   . Iron deficiency anemia   . Fracture, proximal femur ([place]) 03/19/2014  . Painful orthopaedic hardware ([place]) 01/13/2013  . Fall at home 01/18/2012  . Hip fracture, left ([place]) 01/18/2012  . Femur fracture, left ([place]) 01/18/2012  . ESOPHAGEAL MOTILITY DISORDER 11/16/2009  . PERSONAL HX PROSTATE CANCER 11/16/2009  . DIVERTICULITIS-COLON 08/30/2009  . CONSTIPATION 08/30/2009  . HYPERTENSION 07/28/2009  . GERD 07/28/2009  . DIVERTICULOSIS-COLON 07/28/2009  . ABDOMINAL PAIN, LEFT UPPER QUADRANT 07/28/2009  . ABDOMINAL PAIN, LEFT LOWER QUADRANT 07/28/2009  . PERSONAL HX COLONIC POLYPS 07/28/2009  . ACHALASIA 03/09/2009  . RECTAL BLEEDING 03/09/2009  . NONSPECIFIC ABN  FINDING RAD & OTH EXAM GI TRACT 02/25/2009  . DYSPHAGIA UNSPECIFIED 02/24/2009    SALISBURY,DONNA 03/15/2017, 5:40 PM  [place], [place], [address] Phone: [phone]   Fax:  [phone]  Name: YECHIEL ERNY MRN: [id number] Date of Birth: 11-Dec-1931   Serafina Royals, PT 03/15/17 5:41 PM

## 2017-03-15 NOTE — Progress Notes (Signed)
PCP is Gaynelle Arabian, MD Referring Provider is Gaynelle Arabian, MD  No chief complaint on file.   HPI: Mr. Norgaard is sent to [facility] for consultation regarding a left upper lobe mass.  Mr. Morain is an 84 yo man with a past history of prostate cancer, achalasia, GERD, type 2 diabetes without complication, hypertension and arthritis. He was in his usual state of health until 3 weeks ago. He became ill with cough, left sided pleuritic CP and general malaise. He went to Dr. Marisue Humble. A CXR showed a possible LUL mass. A CT was done which revealed a 2.7 x 2.5 x 2.1 cm spiculated nodule. A PET CT showed mild hypermetabolic activity with an SUV of 3.27. There was no hypermetabolic hilar or mediastinal adenopathy or distant metastases.  He is a lifelong nonsmoker. He still works part time as a Development worker, international aid. He denies chest pain, pressure or tightness at rest or with exertion. He denies shortness of breath, fever, chills. He has lost about 5 pounds over the past month. His cough has improved as has his CP.   Zubrod Score: At the time of surgery this patient's most appropriate activity status/level should be described as: [x]     0    Normal activity, no symptoms []     1    Restricted in physical strenuous activity but ambulatory, able to do out light work []     2    Ambulatory and capable of self care, unable to do work activities, up and about >50 % of waking hours                              []     3    Only limited self care, in bed greater than 50% of waking hours []     4    Completely disabled, no self care, confined to bed or chair []     5    Moribund  Past Medical History:  Diagnosis Date  . Arthritis   . Cancer ([place])    HX PROSTATE CANCER - MANY YRS AGO - ? RADIATION [place]  . Diabetes mellitus without complication (HCC)    DIET CONTROLLED  . GERD (gastroesophageal reflux disease)   . History of kidney stones   . History of nonunion of fracture    LEFT PROXIMAL FEMUR  . Hypertension   .  Seasonal allergies     Past Surgical History:  Procedure Laterality Date  . CONVERSION TO TOTAL HIP Left 03/19/2014   Procedure: CONVERSION OF PREVIOUS HIP SURGERY TO LEFT TOTAL HIP ARTHROPLASTY;  Surgeon: Gearlean Alf, MD;  [facility];  Service: Orthopedics;  Laterality: Left;  . FEMUR IM NAIL  01/18/2012   Procedure: INTRAMEDULLARY (IM) NAIL FEMORAL;  Surgeon: Gearlean Alf, MD;  Location: [place];  Service: Orthopedics;  Laterality: Left;  . HARDWARE REMOVAL Left 01/13/2013   Procedure: HARDWARE REVISION LEFT HIP AND EXCHANGE OF COMPRESSION SCREW;  Surgeon: Gearlean Alf, MD;  [facility];  Service: Orthopedics;  Laterality: Left;  . HIATAL HERNIA REPAIR  2011  . JOINT REPLACEMENT  12/2011   BIL KNEE REPLACEMENTS    Family History  Problem Relation Age of Onset  . Heart attack Mother   . Hypertension Mother   . Heart attack Father   . Hypertension Father   . Colon cancer Neg Hx   . Esophageal cancer Neg Hx   . Rectal cancer Neg Hx   .  Stomach cancer Neg Hx     Social History Social History   Tobacco Use  . Smoking status: Never Smoker  . Smokeless tobacco: Never Used  Substance Use Topics  . Alcohol use: No  . Drug use: No    Current Outpatient Medications  Medication Sig Dispense Refill  . aspirin EC 81 MG tablet Take 81 mg by mouth daily.    Marland Kitchen lisinopril (PRINIVIL,ZESTRIL) 20 MG tablet Take 20 mg by mouth every morning.    . loratadine (CLARITIN) 10 MG tablet Take 10 mg by mouth daily.    . meclizine (ANTIVERT) 25 MG tablet Take 1 tablet (25 mg total) by mouth 2 (two) times daily as needed for dizziness. 15 tablet 0  . metoprolol succinate (TOPROL-XL) 100 MG 24 hr tablet Take 100 mg by mouth daily with breakfast.     . pantoprazole (PROTONIX) 40 MG tablet Take 40 mg by mouth daily.    . promethazine (PHENERGAN) 12.5 MG tablet Take 12.5 mg by mouth every 6 (six) hours as needed for nausea or vomiting.     No current facility-administered medications  for this visit.     No Known Allergies  Review of Systems  Constitutional: Positive for activity change, fatigue (x 3 weeks, getting better) and unexpected weight change (5 pounds in 1 month).  HENT: Negative for trouble swallowing and voice change.   Eyes: Negative for visual disturbance.  Respiratory: Positive for cough. Negative for shortness of breath and wheezing.   Cardiovascular: Negative for chest pain and leg swelling.  Gastrointestinal: Negative for abdominal distention and abdominal pain.  Genitourinary: Positive for frequency. Negative for difficulty urinating and dysuria.  Neurological: Negative for syncope and weakness.  Hematological: Negative for adenopathy. Does not bruise/bleed easily.    BP (!) 167/78   Pulse 79   Temp 97.6 F (36.4 C)   Resp 20   Wt 180 lb 9.6 oz (81.9 kg)   SpO2 97%   BMI 23.83 kg/m  Physical Exam  Constitutional: He is oriented to person, place, and time. He appears well-developed and well-nourished. No distress.  HENT:  Head: Normocephalic and atraumatic.  Mouth/Throat: No oropharyngeal exudate.  Eyes: Conjunctivae and EOM are normal.  Neck: Neck supple. No thyromegaly present.  Cardiovascular: Normal rate and regular rhythm. Exam reveals no gallop and no friction rub.  No murmur heard. Pulmonary/Chest: Effort normal and breath sounds normal. No respiratory distress. He has no wheezes. He has no rales.  Abdominal: Soft. He exhibits no distension. There is no tenderness.  Musculoskeletal: He exhibits no edema.  Lymphadenopathy:    He has no cervical adenopathy.  Neurological: He is alert and oriented to person, place, and time. No cranial nerve deficit. He exhibits normal muscle tone.  Skin: Skin is warm and dry.  Vitals reviewed.    Diagnostic Tests: CT CHEST WITH CONTRAST  TECHNIQUE: Multidetector CT imaging of the chest was performed during intravenous contrast administration.  CONTRAST:  36mL ISOVUE-300 IOPAMIDOL  (ISOVUE-300) INJECTION 61%  COMPARISON:  Chest x-ray 02/23/2017  FINDINGS: Cardiovascular: The heart is normal in size. No pericardial effusion. There is tortuosity, ectasia and calcification of the thoracic aorta but no dissection. Dense and extensive three-vessel coronary artery calcifications are noted.  Mediastinum/Nodes: Small scattered mediastinal and hilar lymph nodes. 7 mm pretracheal lymph node on image number 44. 3 small, 7 mm aorticopulmonary window lymph nodes. 8.5 mm right hilar lymph node on image number 50. 8.5 mm infrahilar lymph node on the left on image  number 57. 8.5 mm subcarinal lymph node on image number 55.  There is a moderate-sized hiatal hernia and surgical changes consistent with a slipped Nissen fundoplication.  Lungs/Pleura: Underlying emphysematous changes. Irregular lobulated and spiculated 2.7 x 2.5 x 2.1 cm left upper lobe pulmonary lesion worrisome for neoplasm.  No pulmonary nodules are identified.  No pleural effusion.  Upper Abdomen: No significant upper abdominal findings. No hepatic or adrenal gland lesions. There is a large upper pole right renal cyst noted. Moderate to advanced atherosclerotic calcifications involving the upper abdominal aorta.  Musculoskeletal: Postoperative changes involving the left hemithorax suspect prior thoracotomy. No bone lesions. No supraclavicular or axillary adenopathy. The thyroid gland is grossly normal.  IMPRESSION: 1. 2.7 x 2.5 x 2.1 cm left upper lobe pulmonary lesion worrisome for primary lung neoplasm. Recommend PET-CT for further evaluation and referral to multi-disciplinary thoracic oncology clinic [facility]). 2. Mediastinal and hilar lymph nodes are indeterminate. 3. Advanced atherosclerotic calcifications involving the thoracic and abdominal aorta and coronary arteries. 4. Slipped Nissen fundoplication. 5. Mild underlying emphysematous changes.  Aortic Atherosclerosis (ICD10-I70.0) and  Emphysema (ICD10-J43.9).   Electronically Signed   By: Marijo Sanes M.D.   On: 02/28/2017 09:38   I personally reviewed the CT images and concur with the findings noted above. I also reviewed the PET images from [facility] which are accessible from our PACS system. Nodule is hypermetabolic with SUV 5.91. No evidence of metastasis  Impression: Mr. Ham is an 84 yo man with a history of achalasia, s/p Heller myotomy, prostate cancer, hypertension and arthritis. He was doing well until about 3 weeks ago when he developed a cough and general malaise. Workup showed a left upper lobe nodule that is highly suspicious for a new primary bronchogenic carcinoma. Granulomatous disease is also in the differential.  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 Given those factors and the recent illness raises suspicion for an infectious etiology, although cancer is still the most likely. Because of that I recommended we proceed with a navigational bronchoscopy for biopsy. Given the size of the lesion I think we have about a 90% chance of getting a definitive answer. If definitely granulomatous disease would treat medically. If indeterminate or positive for cancer the next step would be a left upper lobectomy.  I discussed navigational bronchoscopy with Mr Harty and his family. They are aware of the general nature of the procedure and the plan to do it under general anesthesia. I informed them of the indications, risks, benefits and alternatives. They understand the risks include those associated with general anesthesia including death, MI, DVT, PE, stroke, as well as procedure specific risks such as bleeding, pneumothorax and failure to make a diagnosis.  He accepts the risks and agrees to proceed.  Plan: Navigational bronchoscopy on Monday 03/19/2017  Melrose Nakayama, MD [facility] [phone]

## 2017-03-16 ENCOUNTER — Telehealth: Payer: Self-pay | Admitting: General Practice

## 2017-03-16 ENCOUNTER — Encounter (HOSPITAL_COMMUNITY): Payer: Self-pay | Admitting: *Deleted

## 2017-03-16 ENCOUNTER — Other Ambulatory Visit: Payer: Self-pay | Admitting: *Deleted

## 2017-03-16 ENCOUNTER — Other Ambulatory Visit: Payer: Self-pay

## 2017-03-16 ENCOUNTER — Encounter: Payer: Self-pay | Admitting: *Deleted

## 2017-03-16 DIAGNOSIS — R918 Other nonspecific abnormal finding of lung field: Secondary | ICD-10-CM

## 2017-03-16 NOTE — Telephone Encounter (Signed)
[place] Psychosocial Distress Screening Clinical Social Work  Clinical Social Work was referred by distress screening protocol.  The patient scored a 5 on the Psychosocial Distress Thermometer which indicates moderate distress. Clinical Social Worker Edwyna Shell to assess for distress and other psychosocial needs. CSW was unable to reach patient, spoke w daughter who lives w him during the week.   Discussed common feeling and emotions when being diagnosed with cancer, and the importance of support during treatment. CSW informed family of the support team and support services at [facility]. CSW provided contact information and encouraged patient to call with any questions or concerns, will mail packet of information.  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  No concerns w transport or help in the home.  If needed, family can assist as appropriate.  Per daughter, this is a time of high anxiety and stress as possible cancer diagnosis was unexpected.  Family is awaiting scheduling of diagnostic testing and establishment of treatment plan.  Appreciates support from [facility] treatment team.    [place] 03/16/2017  Screening Type Initial Screening  Distress experienced in past week (1-10) 5  Emotional problem type Adjusting to illness  Information Concerns Type Lack of info about diagnosis;Lack of info about treatment  Physical Problem type Loss of appetitie  Referral to clinical social work Yes    Clinical Social Worker follow up needed: No.  If yes, follow up plan:   Edwyna Shell, LCSW Clinical Social Worker Phone:  [phone]

## 2017-03-16 NOTE — Progress Notes (Signed)
Oncology Nurse Navigator Documentation  Oncology Nurse Navigator Flowsheets 03/16/2017  Navigator Location [facility]  Navigator Encounter Type Clinic/[facility]/Joseph [name] was seen at thoracic clinic due to abnormal scans.  He will have tissue DX on 03/19/17.  I helped explain next steps and coordinate biopsy.    Abnormal Finding Date 02/23/2017  Multidisiplinary Clinic Date 03/15/2017  Patient Visit Type MedOnc  Treatment Phase Abnormal Scans  Barriers/Navigation Needs Education;Coordination of Care  Education Other  Interventions Coordination of Care;Education  Coordination of Care Other  Acuity Level 2  Time Spent with Patient 30

## 2017-03-16 NOTE — Progress Notes (Signed)
Spoke with pt's daughter, [name] for pre-op call. Pt has no previous cardiac history. Pt is a type 2 diabetic, diet controlled. Last A1C was 6.7 on 12/25/16 per daughter. She states he has never checked his blood sugar at home.

## 2017-03-20 NOTE — Progress Notes (Signed)
Spoke with pt's daughter on Friday for pre-op call, surgery moved to tomorrow 03/21/17. Called pt's daughter today to give time of arrival and reminded her of pre-op instructions.

## 2017-03-21 ENCOUNTER — Ambulatory Visit (HOSPITAL_COMMUNITY)
Admission: RE | Admit: 2017-03-21 | Discharge: 2017-03-21 | Disposition: A | Discharge status: Home / Self Care | Payer: PPO | Source: Ambulatory Visit | Attending: Thoracic Surgery (Cardiothoracic Vascular Surgery) | Admitting: Thoracic Surgery (Cardiothoracic Vascular Surgery)

## 2017-03-21 ENCOUNTER — Ambulatory Visit (HOSPITAL_COMMUNITY): Payer: PPO | Admitting: Anesthesiology

## 2017-03-21 ENCOUNTER — Ambulatory Visit (HOSPITAL_COMMUNITY): Payer: PPO

## 2017-03-21 ENCOUNTER — Other Ambulatory Visit: Payer: Self-pay

## 2017-03-21 ENCOUNTER — Encounter (HOSPITAL_COMMUNITY): Payer: Self-pay

## 2017-03-21 ENCOUNTER — Encounter (HOSPITAL_COMMUNITY)
Admission: RE | Disposition: A | Discharge status: Home / Self Care | Payer: Self-pay | Source: Ambulatory Visit | Attending: Thoracic Surgery (Cardiothoracic Vascular Surgery)

## 2017-03-21 DIAGNOSIS — J439 Emphysema, unspecified: Secondary | ICD-10-CM | POA: Insufficient documentation

## 2017-03-21 DIAGNOSIS — M1990 Unspecified osteoarthritis, unspecified site: Secondary | ICD-10-CM | POA: Diagnosis not present

## 2017-03-21 DIAGNOSIS — Z419 Encounter for procedure for purposes other than remedying health state, unspecified: Secondary | ICD-10-CM

## 2017-03-21 DIAGNOSIS — C342 Malignant neoplasm of middle lobe, bronchus or lung: Secondary | ICD-10-CM | POA: Diagnosis not present

## 2017-03-21 DIAGNOSIS — Z7982 Long term (current) use of aspirin: Secondary | ICD-10-CM | POA: Insufficient documentation

## 2017-03-21 DIAGNOSIS — K219 Gastro-esophageal reflux disease without esophagitis: Secondary | ICD-10-CM | POA: Insufficient documentation

## 2017-03-21 DIAGNOSIS — D509 Iron deficiency anemia, unspecified: Secondary | ICD-10-CM | POA: Diagnosis not present

## 2017-03-21 DIAGNOSIS — J984 Other disorders of lung: Secondary | ICD-10-CM | POA: Diagnosis not present

## 2017-03-21 DIAGNOSIS — R911 Solitary pulmonary nodule: Secondary | ICD-10-CM | POA: Diagnosis not present

## 2017-03-21 DIAGNOSIS — Z8546 Personal history of malignant neoplasm of prostate: Secondary | ICD-10-CM | POA: Insufficient documentation

## 2017-03-21 DIAGNOSIS — C3412 Malignant neoplasm of upper lobe, left bronchus or lung: Secondary | ICD-10-CM | POA: Diagnosis not present

## 2017-03-21 DIAGNOSIS — Z96642 Presence of left artificial hip joint: Secondary | ICD-10-CM | POA: Insufficient documentation

## 2017-03-21 DIAGNOSIS — Z79899 Other long term (current) drug therapy: Secondary | ICD-10-CM | POA: Diagnosis not present

## 2017-03-21 DIAGNOSIS — I10 Essential (primary) hypertension: Secondary | ICD-10-CM | POA: Diagnosis not present

## 2017-03-21 DIAGNOSIS — Z8249 Family history of ischemic heart disease and other diseases of the circulatory system: Secondary | ICD-10-CM | POA: Insufficient documentation

## 2017-03-21 DIAGNOSIS — Z01818 Encounter for other preprocedural examination: Secondary | ICD-10-CM

## 2017-03-21 DIAGNOSIS — Z96653 Presence of artificial knee joint, bilateral: Secondary | ICD-10-CM | POA: Insufficient documentation

## 2017-03-21 DIAGNOSIS — I700 Atherosclerosis of aorta: Secondary | ICD-10-CM | POA: Diagnosis not present

## 2017-03-21 DIAGNOSIS — E119 Type 2 diabetes mellitus without complications: Secondary | ICD-10-CM | POA: Diagnosis not present

## 2017-03-21 HISTORY — PX: VIDEO BRONCHOSCOPY WITH ENDOBRONCHIAL NAVIGATION: SHX6175

## 2017-03-21 HISTORY — DX: Pneumonia, unspecified organism: J18.9

## 2017-03-21 HISTORY — DX: Personal history of other diseases of the digestive system: Z87.19

## 2017-03-21 LAB — PROTIME-INR
INR: 1.03
PROTHROMBIN TIME: 13.4 s (ref 11.4–15.2)

## 2017-03-21 LAB — COMPREHENSIVE METABOLIC PANEL
ALT: 10 U/L — ABNORMAL LOW (ref 17–63)
AST: 16 U/L (ref 15–41)
Albumin: 3.4 g/dL — ABNORMAL LOW (ref 3.5–5.0)
Alkaline Phosphatase: 91 U/L (ref 38–126)
Anion gap: 12 (ref 5–15)
BUN: 14 mg/dL (ref 6–20)
CHLORIDE: 106 mmol/L (ref 101–111)
CO2: 22 mmol/L (ref 22–32)
Calcium: 9 mg/dL (ref 8.9–10.3)
Creatinine, Ser: 1.16 mg/dL (ref 0.61–1.24)
GFR calc Af Amer: >60 mL/min (ref >60)
GFR, EST NON AFRICAN AMERICAN: 56 mL/min — AB (ref >60)
Glucose, Bld: 123 mg/dL — ABNORMAL HIGH (ref 65–99)
POTASSIUM: 3.9 mmol/L (ref 3.5–5.1)
SODIUM: 140 mmol/L (ref 135–145)
Total Bilirubin: 0.6 mg/dL (ref 0.3–1.2)
Total Protein: 6.4 g/dL — ABNORMAL LOW (ref 6.5–8.1)

## 2017-03-21 LAB — GLUCOSE, CAPILLARY
Glucose-Capillary: 95 mg/dL (ref 65–99)
Glucose-Capillary: 109 mg/dL — ABNORMAL HIGH (ref 65–99)

## 2017-03-21 LAB — HEMOGLOBIN A1C
HEMOGLOBIN A1C: 6.8 % — AB (ref 4.8–5.6)
MEAN PLASMA GLUCOSE: 148.46 mg/dL

## 2017-03-21 LAB — CBC
HCT: 41.1 % (ref 39.0–52.0)
Hemoglobin: 12.7 g/dL — ABNORMAL LOW (ref 13.0–17.0)
MCH: 25.2 pg — ABNORMAL LOW (ref 26.0–34.0)
MCHC: 30.9 g/dL (ref 30.0–36.0)
MCV: 81.5 fL (ref 78.0–100.0)
PLATELETS: 228 10*3/uL (ref 150–400)
RBC: 5.04 MIL/uL (ref 4.22–5.81)
RDW: 15.7 % — AB (ref 11.5–15.5)
WBC: 4.6 10*3/uL (ref 4.0–10.5)

## 2017-03-21 LAB — APTT: aPTT: 30 seconds (ref 24–36)

## 2017-03-21 SURGERY — VIDEO BRONCHOSCOPY WITH ENDOBRONCHIAL NAVIGATION
Anesthesia: General

## 2017-03-21 MED ORDER — FENTANYL CITRATE (PF) 250 MCG/5ML IJ SOLN
INTRAMUSCULAR | Status: AC
  Filled 2017-03-21: qty 5

## 2017-03-21 MED ORDER — SUGAMMADEX SODIUM 200 MG/2ML IV SOLN
INTRAVENOUS | Status: AC
  Filled 2017-03-21: qty 2

## 2017-03-21 MED ORDER — LIDOCAINE 2% (20 MG/ML) 5 ML SYRINGE
INTRAMUSCULAR | Status: DC | PRN
  Administered 2017-03-21: 30 mg via INTRAVENOUS

## 2017-03-21 MED ORDER — PHENYLEPHRINE HCL 10 MG/ML IJ SOLN
INTRAMUSCULAR | Status: DC | PRN
  Administered 2017-03-21: 20 ug/min via INTRAVENOUS

## 2017-03-21 MED ORDER — MIDAZOLAM HCL 2 MG/2ML IJ SOLN: 0.5000 mg | Freq: Once | INTRAMUSCULAR | Status: DC | PRN

## 2017-03-21 MED ORDER — PROPOFOL 10 MG/ML IV BOLUS
INTRAVENOUS | Status: DC | PRN
  Administered 2017-03-21: 50 mg via INTRAVENOUS
  Administered 2017-03-21: 100 mg via INTRAVENOUS
  Administered 2017-03-21: 20 mg via INTRAVENOUS

## 2017-03-21 MED ORDER — LIDOCAINE 2% (20 MG/ML) 5 ML SYRINGE
INTRAMUSCULAR | Status: AC
  Filled 2017-03-21: qty 5

## 2017-03-21 MED ORDER — ONDANSETRON HCL 4 MG/2ML IJ SOLN
INTRAMUSCULAR | Status: DC | PRN
  Administered 2017-03-21: 4 mg via INTRAVENOUS

## 2017-03-21 MED ORDER — MEPERIDINE HCL 50 MG/ML IJ SOLN: 6.2500 mg | INTRAMUSCULAR | Status: DC | PRN

## 2017-03-21 MED ORDER — ROCURONIUM BROMIDE 10 MG/ML (PF) SYRINGE
PREFILLED_SYRINGE | INTRAVENOUS | Status: AC
  Filled 2017-03-21: qty 5

## 2017-03-21 MED ORDER — LACTATED RINGERS IV SOLN
INTRAVENOUS | Status: DC
  Administered 2017-03-21: 09:00:00 via INTRAVENOUS

## 2017-03-21 MED ORDER — FENTANYL CITRATE (PF) 100 MCG/2ML IJ SOLN: 25.0000 ug | INTRAMUSCULAR | Status: DC | PRN

## 2017-03-21 MED ORDER — PROPOFOL 10 MG/ML IV BOLUS
INTRAVENOUS | Status: AC
  Filled 2017-03-21: qty 20

## 2017-03-21 MED ORDER — ONDANSETRON HCL 4 MG/2ML IJ SOLN
INTRAMUSCULAR | Status: AC
  Filled 2017-03-21: qty 2

## 2017-03-21 MED ORDER — 0.9 % SODIUM CHLORIDE (POUR BTL) OPTIME
TOPICAL | Status: DC | PRN
  Administered 2017-03-21: 1000 mL

## 2017-03-21 MED ORDER — SUGAMMADEX SODIUM 200 MG/2ML IV SOLN
INTRAVENOUS | Status: DC | PRN
  Administered 2017-03-21: 200 mg via INTRAVENOUS

## 2017-03-21 MED ORDER — PROMETHAZINE HCL 25 MG/ML IJ SOLN: 6.2500 mg | INTRAMUSCULAR | Status: DC | PRN

## 2017-03-21 MED ORDER — DEXAMETHASONE SODIUM PHOSPHATE 10 MG/ML IJ SOLN
INTRAMUSCULAR | Status: DC | PRN
  Administered 2017-03-21: 10 mg via INTRAVENOUS

## 2017-03-21 MED ORDER — FENTANYL CITRATE (PF) 250 MCG/5ML IJ SOLN
INTRAMUSCULAR | Status: DC | PRN
  Administered 2017-03-21 (×3): 50 ug via INTRAVENOUS

## 2017-03-21 MED ORDER — DEXAMETHASONE SODIUM PHOSPHATE 10 MG/ML IJ SOLN
INTRAMUSCULAR | Status: AC
  Filled 2017-03-21: qty 1

## 2017-03-21 MED ORDER — ROCURONIUM BROMIDE 10 MG/ML (PF) SYRINGE
PREFILLED_SYRINGE | INTRAVENOUS | Status: DC | PRN
  Administered 2017-03-21: 10 mg via INTRAVENOUS
  Administered 2017-03-21: 50 mg via INTRAVENOUS

## 2017-03-21 SURGICAL SUPPLY — 33 items
ADAPTER BRONCH F/PENTAX (ADAPTER) ×2 IMPLANT
BRUSH BIOPSY BRONCH 10 SDTNB (MISCELLANEOUS) ×2 IMPLANT
BRUSH SUPERTRAX BIOPSY (INSTRUMENTS) IMPLANT
BRUSH SUPERTRAX NDL-TIP CYTO (INSTRUMENTS) ×2 IMPLANT
CANISTER SUCT 3000ML PPV (MISCELLANEOUS) ×2 IMPLANT
CONT SPEC 4OZ CLIKSEAL STRL BL (MISCELLANEOUS) ×4 IMPLANT
COVER BACK TABLE 60X90IN (DRAPES) ×2 IMPLANT
FILTER STRAW FLUID ASPIR (MISCELLANEOUS) IMPLANT
FORCEPS BIOP SUPERTRX PREMAR (INSTRUMENTS) ×2 IMPLANT
GAUZE SPONGE 4X4 12PLY STRL (GAUZE/BANDAGES/DRESSINGS) ×2 IMPLANT
GLOVE SURG SIGNA 7.5 PF LTX (GLOVE) ×2 IMPLANT
GOWN STRL REUS W/ TWL XL LVL3 (GOWN DISPOSABLE) ×1 IMPLANT
GOWN STRL REUS W/TWL XL LVL3 (GOWN DISPOSABLE) ×1
KIT CLEAN ENDO COMPLIANCE (KITS) ×2 IMPLANT
KIT PROCEDURE EDGE 180 (KITS) ×2 IMPLANT
KIT ROOM TURNOVER OR (KITS) ×2 IMPLANT
MARKER SKIN DUAL TIP RULER LAB (MISCELLANEOUS) ×2 IMPLANT
NEEDLE SUPERTRX PREMARK BIOPSY (NEEDLE) ×2 IMPLANT
NS IRRIG 1000ML POUR BTL (IV SOLUTION) ×2 IMPLANT
OIL SILICONE PENTAX (PARTS (SERVICE/REPAIRS)) ×2 IMPLANT
PAD ARMBOARD 7.5X6 YLW CONV (MISCELLANEOUS) ×4 IMPLANT
PATCHES PATIENT (LABEL) ×6 IMPLANT
SYR 20CC LL (SYRINGE) ×2 IMPLANT
SYR 20ML ECCENTRIC (SYRINGE) ×2 IMPLANT
SYR 30ML LL (SYRINGE) ×2 IMPLANT
SYR 5ML LL (SYRINGE) ×2 IMPLANT
TOWEL GREEN STERILE (TOWEL DISPOSABLE) ×2 IMPLANT
TOWEL GREEN STERILE FF (TOWEL DISPOSABLE) ×2 IMPLANT
TRAP SPECIMEN MUCOUS 40CC (MISCELLANEOUS) ×2 IMPLANT
TUBE CONNECTING 20X1/4 (TUBING) ×4 IMPLANT
UNDERPAD 30X30 (UNDERPADS AND DIAPERS) ×2 IMPLANT
VALVE DISPOSABLE (MISCELLANEOUS) ×2 IMPLANT
WATER STERILE IRR 1000ML POUR (IV SOLUTION) ×2 IMPLANT

## 2017-03-21 NOTE — Transfer of Care (Signed)
Immediate Anesthesia Transfer of Care Note  Patient: Joseph [name]  Procedure(s) Performed: VIDEO BRONCHOSCOPY WITH ENDOBRONCHIAL NAVIGATION (N/A )  Patient Location: PACU  Anesthesia Type:General  Level of Consciousness: awake, drowsy and patient cooperative  Airway & Oxygen Therapy: Patient Spontanous Breathing and Patient connected to nasal cannula oxygen  Post-op Assessment: Report given to RN and Post -op Vital signs reviewed and stable  Post vital signs: Reviewed and stable  Last Vitals:  Vitals:   03/21/17 0840 03/21/17 1224  BP: (!) 196/86   Resp: 18   Temp: 37 C (P) 36.5 C  SpO2: 100%     Last Pain:  Vitals:   03/21/17 1224  TempSrc:   PainSc: (P) 0-No pain         Complications: No apparent anesthesia complications

## 2017-03-21 NOTE — Anesthesia Postprocedure Evaluation (Signed)
Anesthesia Post Note  Patient: Joseph [name]  Procedure(s) Performed: VIDEO BRONCHOSCOPY WITH ENDOBRONCHIAL NAVIGATION (N/A )     Patient location during evaluation: PACU Anesthesia Type: General Level of consciousness: awake and alert, oriented and patient cooperative Pain management: pain level controlled Vital Signs Assessment: post-procedure vital signs reviewed and stable Respiratory status: spontaneous breathing, nonlabored ventilation and respiratory function stable Cardiovascular status: blood pressure returned to baseline and stable Postop Assessment: no apparent nausea or vomiting Anesthetic complications: no    Last Vitals:  Vitals:   03/21/17 1254 03/21/17 1316  BP: (!) 164/89 (!) 167/90  Pulse: 78   Resp: 16 16  Temp: 36.5 C   SpO2: 95% 97%    Last Pain:  Vitals:   03/21/17 1254  TempSrc:   PainSc: 0-No pain                 Vonetta Foulk,E. Quynn Vilchis

## 2017-03-21 NOTE — Brief Op Note (Signed)
03/21/2017  12:27 PM  PATIENT:  Andranik L Strohman  84 y.o. male  PRE-OPERATIVE DIAGNOSIS:  LUL NODULE  POST-OPERATIVE DIAGNOSIS:  LUL LUNG CANCER  PROCEDURE:  Procedure(s): VIDEO BRONCHOSCOPY WITH ENDOBRONCHIAL NAVIGATION (N/A) - needle aspirations, brushings and transbronchial biopsies  SURGEON:  Surgeon(s) and Role:    * Melrose Nakayama, MD - Primary  PHYSICIAN ASSISTANT:   ASSISTANTS: none   ANESTHESIA:   general   EBL:  minimal  BLOOD ADMINISTERED:none  DRAINS: none   LOCAL MEDICATIONS USED:  NONE  SPECIMEN:  Source of Specimen:  LUL nodule  DISPOSITION OF SPECIMEN:  PATHOLOGY  COUNTS:  Endoscopic  TOURNIQUET:  * No tourniquets in log *  DICTATION: .Other Dictation: Dictation Number -  PLAN OF CARE: Discharge to home after PACU  PATIENT DISPOSITION:  PACU - hemodynamically stable.   Delay start of Pharmacological VTE agent (>24hrs) due to surgical blood loss or risk of bleeding: not applicable

## 2017-03-21 NOTE — Discharge Instructions (Addendum)
Do not drive or engage in heavy physical activity for 24 hours  You may resume normal activities tomorrow  You may cough up small amounts of blood over the next few days  You may use acetaminophen (Tylenol) if needed for discomfort.  Call [phone] if you develop chest pain, shortness of breath, fever > 101 F or cough up more than 2 tablespoons of blood  My office will contact you with a follow up appointment

## 2017-03-21 NOTE — Anesthesia Preprocedure Evaluation (Addendum)
Anesthesia Evaluation  Patient identified by MRN, date of birth, ID band Patient awake    Reviewed: Allergy & Precautions, Patient's Chart, lab work & pertinent test results  History of Anesthesia Complications Negative for: history of anesthetic complications  Airway Mallampati: I  TM Distance: >3 FB Neck ROM: Full    Dental  (+) Dental Advisory Given, Missing, Chipped   Pulmonary COPD,  COPD inhaler,    breath sounds clear to auscultation       Cardiovascular hypertension, Pt. on medications and Pt. on home beta blockers (-) angina Rhythm:Regular Rate:Normal     Neuro/Psych negative neurological ROS     GI/Hepatic Neg liver ROS, GERD  Medicated and Controlled,  Endo/Other  diabetes (diet controlled, glu 95)  Renal/GU negative Renal ROS   H/o prostate cancer    Musculoskeletal  (+) Arthritis ,   Abdominal   Peds  Hematology negative hematology ROS (+)   Anesthesia Other Findings   Reproductive/Obstetrics                            Anesthesia Physical Anesthesia Plan  ASA: III  Anesthesia Plan: General   Post-op Pain Management:    Induction: Intravenous  PONV Risk Score and Plan: 2 and Ondansetron and Dexamethasone  Airway Management Planned: Oral ETT  Additional Equipment:   Intra-op Plan:   Post-operative Plan: Extubation in OR  Informed Consent: I have reviewed the patients History and Physical, chart, labs and discussed the procedure including the risks, benefits and alternatives for the proposed anesthesia with the patient or authorized representative who has indicated his/her understanding and acceptance.   Dental advisory given  Plan Discussed with: CRNA and Surgeon  Anesthesia Plan Comments: (Plan routine monitors, GETA)        Anesthesia Quick Evaluation

## 2017-03-21 NOTE — Interval H&P Note (Signed)
History and Physical Interval Note:  03/21/2017 10:46 AM  Joseph [name]  has presented today for surgery, with the diagnosis of LUL NODULE  The various methods of treatment have been discussed with the patient and family. After consideration of risks, benefits and other options for treatment, the patient has consented to  Procedure(s): [place] (N/A) as a surgical intervention .  The patient's history has been reviewed, patient examined, no change in status, stable for surgery.  I have reviewed the patient's chart and labs.  Questions were answered to the patient's satisfaction.     Melrose Nakayama

## 2017-03-21 NOTE — Op Note (Signed)
Joseph [name], Joseph [name]              ACCOUNT NO.:  [id number]  MEDICAL RECORD NO.:  [id number]  LOCATION:  [facility]                         FACILITY:  [facility]  PHYSICIAN:  Revonda Standard. Roxan Hockey, M.D.DATE OF BIRTH:  02/21/1931  DATE OF PROCEDURE:  03/21/2017 DATE OF DISCHARGE:                              OPERATIVE REPORT   PREOPERATIVE DIAGNOSIS:  Left upper lobe nodule.  POSTOPERATIVE DIAGNOSIS:  Non-small cell carcinoma, left upper lobe-Clinical stage IA, T1N0.  PROCEDURE:  Electromagnetic navigational bronchoscopy with needle aspirations, brushings, and transbronchial biopsies.  SURGEON:  Revonda Standard. Roxan Hockey, MD.  ASSISTANT:  None.  ANESTHESIA:  General.  FINDINGS:  Initial needle aspirations and brushings both showed malignant cells consistent with non-small cell carcinoma.  CLINICAL NOTE:  Joseph [name] is an 84 year old gentleman who is a lifelong nonsmoker.  He does have a history of prostate cancer, achalasia, type 2 diabetes, and reflux.  About 3 weeks ago, he developed a cough with left- sided pleuritic chest pain and general malaise.  A chest x-ray showed a possible left upper lobe mass.  A CT showed a 2.7 x 2.5 x 2.1 cm spiculated nodule in the left upper lobe.  This was mildly hypermetabolic on PET-CT.  He was advised to undergo navigational bronchoscopy for confirmation of diagnosis.  The indications, risks, benefits, and alternatives were discussed in detail with the patient. He understood and accepted the risks and agreed to proceed.  OPERATIVE NOTE:  Joseph [name] was brought to the operating room on March 21, 2017.  He had induction of general anesthesia and was intubated.  After performing a time-out, flexible fiberoptic bronchoscopy was performed via the endotracheal tube.  It revealed normal endobronchial anatomy with no endobronchial lesions.  The locatable guide for navigation was placed and registration was performed.  There was good correlation of the video  and virtual bronchoscopy.  The bronchoscope then was navigated to the left upper lobe bronchus and the appropriate subsegmental bronchus was cannulated and the catheter passed easily to within a centimeter of the center of the lesion with good alignment.  Positioning was confirmed with fluoroscopy.  Sampling then was performed.  All sampling was done with fluoroscopy.  Three needle aspirations were performed followed by 3 brushings with a triple brush.  These specimens were sent for quick prep.  While awaiting those results, multiple biopsies were obtained for permanent pathology.  While still awaiting results, the catheter was repositioned to another portion of the lesion and sampling process was repeated.  These specimens were all sent for permanent pathology.  The quick prep was returned showing malignant cells, probable non-small cell carcinoma.  The locatable guide was removed as was the sheath.  Final inspection was made and there was no ongoing bleeding.  The patient was extubated in the operating room and taken to the postanesthetic care unit in good condition.     Revonda Standard Roxan Hockey, M.D.     SCH/MEDQ  D:  03/21/2017  T:  03/21/2017  Job:  [id number]

## 2017-03-22 ENCOUNTER — Encounter (HOSPITAL_COMMUNITY): Payer: Self-pay | Admitting: Thoracic Surgery (Cardiothoracic Vascular Surgery)

## 2017-03-29 ENCOUNTER — Ambulatory Visit: Payer: PPO | Admitting: Thoracic Surgery (Cardiothoracic Vascular Surgery)

## 2017-03-29 VITALS — BP 145/90 | HR 74 | Resp 20 | Ht 73.0 in | Wt 175.0 lb

## 2017-03-29 DIAGNOSIS — Z9889 Other specified postprocedural states: Secondary | ICD-10-CM | POA: Diagnosis not present

## 2017-03-29 DIAGNOSIS — R911 Solitary pulmonary nodule: Secondary | ICD-10-CM | POA: Diagnosis not present

## 2017-03-29 DIAGNOSIS — IMO0001 Reserved for inherently not codable concepts without codable children: Secondary | ICD-10-CM

## 2017-03-29 NOTE — Progress Notes (Signed)
[address]       [place] [address]             [phone]      HPI: Joseph [name] returns to discuss the results of his navigational bronchoscopy.  Joseph [name] is an 84 yo man with a PMH of prostate cancer, type 2 diabetes without complication, hypertension, achalasia and arthritis. He is a life long nonsmoker.  In January he developed cough, malaise, and pleuritic left-sided chest pain.  A chest x-ray showed a possible left upper lobe mass.  A CT showed a 2.7 x 2.5 x 2.1 cm spiculated nodule in the left upper lobe.  This was hypermetabolic on PET with an SUV of 3.27.  There was no evidence of regional or distant metastases.  I did a navigational bronchoscopy on 03/21/2017.  The procedure was uncomplicated.  The nodule was a non-small cell carcinoma.  Joseph [name] is very physically active.   Past Medical History:  Diagnosis Date  . Arthritis   . Cancer ([facility])    HX PROSTATE CANCER - MANY YRS AGO - ? RADIATION [place]  . Diabetes mellitus without complication ([facility])    DIET CONTROLLED  . GERD (gastroesophageal reflux disease)   . History of hiatal hernia   . History of kidney stones   . History of nonunion of fracture    LEFT PROXIMAL FEMUR  . Hypertension   . Pneumonia   . Seasonal allergies    Past Surgical History:  Procedure Laterality Date  . CONVERSION TO TOTAL HIP Left 03/19/2014   Procedure: CONVERSION OF PREVIOUS HIP SURGERY TO LEFT TOTAL HIP ARTHROPLASTY;  Surgeon: Gearlean Alf, MD;  Location: [facility];  Service: Orthopedics;  Laterality: Left;  . FEMUR IM NAIL  01/18/2012   Procedure: INTRAMEDULLARY (IM) NAIL FEMORAL;  Surgeon: Gearlean Alf, MD;  Location: [facility];  Service: Orthopedics;  Laterality: Left;  . HARDWARE REMOVAL Left 01/13/2013   Procedure: HARDWARE REVISION LEFT HIP AND EXCHANGE OF COMPRESSION SCREW;  Surgeon: Gearlean Alf, MD;  Location: [facility];  Service: Orthopedics;  Laterality: Left;  . HELLER MYOTOMY    . HIATAL HERNIA REPAIR   2011  . JOINT REPLACEMENT  12/2011   BIL KNEE REPLACEMENTS  . THORACOSCOPY    . VIDEO BRONCHOSCOPY WITH ENDOBRONCHIAL NAVIGATION N/A 03/21/2017   Procedure: VIDEO BRONCHOSCOPY WITH ENDOBRONCHIAL NAVIGATION;  Surgeon: Melrose Nakayama, MD;  Location: [facility];  Service: Thoracic;  Laterality: N/A;   Past Surgical History:  Procedure Laterality Date  . CONVERSION TO TOTAL HIP Left 03/19/2014   Procedure: CONVERSION OF PREVIOUS HIP SURGERY TO LEFT TOTAL HIP ARTHROPLASTY;  Surgeon: Gearlean Alf, MD;  Location: [facility];  Service: Orthopedics;  Laterality: Left;  . FEMUR IM NAIL  01/18/2012   Procedure: INTRAMEDULLARY (IM) NAIL FEMORAL;  Surgeon: Gearlean Alf, MD;  Location: [facility];  Service: Orthopedics;  Laterality: Left;  . HARDWARE REMOVAL Left 01/13/2013   Procedure: HARDWARE REVISION LEFT HIP AND EXCHANGE OF COMPRESSION SCREW;  Surgeon: Gearlean Alf, MD;  [facility];  Service: Orthopedics;  Laterality: Left;  . HELLER MYOTOMY    . HIATAL HERNIA REPAIR  2011  . JOINT REPLACEMENT  12/2011   BIL KNEE REPLACEMENTS  . THORACOSCOPY    . VIDEO BRONCHOSCOPY WITH ENDOBRONCHIAL NAVIGATION N/A 03/21/2017   Procedure: VIDEO BRONCHOSCOPY WITH ENDOBRONCHIAL NAVIGATION;  Surgeon: Melrose Nakayama, MD;  Location: [facility];  Service: Thoracic;  Laterality: N/A;  Current Outpatient Medications  Medication Sig Dispense Refill  . amoxicillin (AMOXIL) 500 MG capsule Take 2,000 mg by mouth See admin instructions. Take 4 capsules (2000 mg) by mouth 1 hours prior to procedure.    Marland Kitchen aspirin EC 81 MG tablet Take 81 mg by mouth daily.    Marland Kitchen HYDROcodone-homatropine (HYCODAN) 5-1.5 MG/5ML syrup Take 5 mLs by mouth every 6 (six) hours as needed for cough.  0  . ibuprofen (ADVIL,MOTRIN) 200 MG tablet Take 400-600 mg by mouth every 8 (eight) hours as needed (for pain.).    Marland Kitchen ipratropium (ATROVENT) 0.06 % nasal spray Place 2 sprays into both nostrils 2 (two) times daily. Up to 3x's daily    . lisinopril  (PRINIVIL,ZESTRIL) 20 MG tablet Take 20 mg by mouth daily.     Marland Kitchen loratadine (CLARITIN) 10 MG tablet Take 10 mg by mouth daily.    . meclizine (ANTIVERT) 25 MG tablet Take 1 tablet (25 mg total) by mouth 2 (two) times daily as needed for dizziness. 15 tablet 0  . metoprolol succinate (TOPROL-XL) 100 MG 24 hr tablet Take 100 mg by mouth daily with breakfast.     . pantoprazole (PROTONIX) 40 MG tablet Take 40 mg by mouth daily before breakfast.      No current facility-administered medications for this visit.     Physical Exam BP (!) 145/90   Pulse 74   Resp 20   Ht 6\' 1"  (1.854 m)   Wt 175 lb (79.4 kg)   SpO2 98% Comment: RA  BMI 23.09 kg/m  84 yo man in NAD Well developed and well nourished No cervical or supraclavicular adenopathy Cardiac: RRR no murmur Lungs: clear with equal BS bilaterally No edema  Diagnostic Tests: CT CHEST WITH CONTRAST  TECHNIQUE: Multidetector CT imaging of the chest was performed during intravenous contrast administration.  CONTRAST:  32mL ISOVUE-300 IOPAMIDOL (ISOVUE-300) INJECTION 61%  COMPARISON:  Chest x-ray 02/23/2017  FINDINGS: Cardiovascular: The heart is normal in size. No pericardial effusion. There is tortuosity, ectasia and calcification of the thoracic aorta but no dissection. Dense and extensive three-vessel coronary artery calcifications are noted.  Mediastinum/Nodes: Small scattered mediastinal and hilar lymph nodes. 7 mm pretracheal lymph node on image number 44. 3 small, 7 mm aorticopulmonary window lymph nodes. 8.5 mm right hilar lymph node on image number 50. 8.5 mm infrahilar lymph node on the left on image number 57. 8.5 mm subcarinal lymph node on image number 55.  There is a moderate-sized hiatal hernia and surgical changes consistent with a slipped Nissen fundoplication.  Lungs/Pleura: Underlying emphysematous changes. Irregular lobulated and spiculated 2.7 x 2.5 x 2.1 cm left upper lobe pulmonary  lesion worrisome for neoplasm.  No pulmonary nodules are identified.  No pleural effusion.  Upper Abdomen: No significant upper abdominal findings. No hepatic or adrenal gland lesions. There is a large upper pole right renal cyst noted. Moderate to advanced atherosclerotic calcifications involving the upper abdominal aorta.  Musculoskeletal: Postoperative changes involving the left hemithorax suspect prior thoracotomy. No bone lesions. No supraclavicular or axillary adenopathy. The thyroid gland is grossly normal.  IMPRESSION: 1. 2.7 x 2.5 x 2.1 cm left upper lobe pulmonary lesion worrisome for primary lung neoplasm. Recommend PET-CT for further evaluation and referral to multi-disciplinary thoracic oncology clinic [facility]). 2. Mediastinal and hilar lymph nodes are indeterminate. 3. Advanced atherosclerotic calcifications involving the thoracic and abdominal aorta and coronary arteries. 4. Slipped Nissen fundoplication. 5. Mild underlying emphysematous changes.  Aortic Atherosclerosis (ICD10-I70.0) and Emphysema (ICD10-J43.9).  Electronically Signed   By: Marijo Sanes M.D.   On: 02/28/2017 09:38 I again reviewed the CT images and concur with the findings noted above  PET CT done at [facility]- no evidence of metastatic disease  Impression: Joseph [name] is an 84 yo man who is a life long non-smoker who was found to have a left upper lobe lung mass. It is an adenocarcinoma by biopsy. Clinical stage is IA-  T1N0.   I discussed the path results with Joseph [name] and his family. We also discussed surgery and radiation as treatment options. They had questions about no treatment as well.  I recommended we proceed with left upper lobectomy for the best chance of a cure.  I discussed the general nature of the procedure, the need for general anesthesia, the incisions to be used, and the use of a drainage tube postoperatively with Joseph [name] and family. We discussed the expected hospital  stay, overall recovery and short and long term outcomes. I informed them of the indications, risks, benefits and alternatives. They understand the risks include, but are not limited to death, stroke, MI, DVT/PE, bleeding, possible need for transfusion, infections, prolonged air leaks, cardiac arrhythmias, as well as other organ system dysfunction including respiratory, renal, or GI complications. They understand and accept these risks and agree to proceed.  He does need PFTs prior to lobectomy, but is a non smoker, so I don't foresee any issues.  Plan:  PFTs with and without bronchodilators  Left VATS, probable thoracotomy, left upper lobectomy. Pt will call to schedule   Melrose Nakayama, MD [facility] [phone]

## 2017-03-30 ENCOUNTER — Other Ambulatory Visit: Payer: Self-pay | Admitting: *Deleted

## 2017-03-30 DIAGNOSIS — C3492 Malignant neoplasm of unspecified part of left bronchus or lung: Secondary | ICD-10-CM

## 2017-03-30 DIAGNOSIS — C3412 Malignant neoplasm of upper lobe, left bronchus or lung: Secondary | ICD-10-CM

## 2017-04-16 ENCOUNTER — Other Ambulatory Visit: Payer: Self-pay | Admitting: *Deleted

## 2017-04-19 ENCOUNTER — Inpatient Hospital Stay (HOSPITAL_COMMUNITY): Admission: RE | Admit: 2017-04-19 | Payer: PPO | Source: Ambulatory Visit

## 2017-04-19 ENCOUNTER — Encounter (HOSPITAL_COMMUNITY): Payer: PPO

## 2017-04-20 DIAGNOSIS — C3412 Malignant neoplasm of upper lobe, left bronchus or lung: Secondary | ICD-10-CM | POA: Diagnosis not present

## 2017-04-23 ENCOUNTER — Encounter (HOSPITAL_COMMUNITY): Admission: RE | Payer: Self-pay | Source: Ambulatory Visit

## 2017-04-23 ENCOUNTER — Inpatient Hospital Stay (HOSPITAL_COMMUNITY)
Admission: RE | Admit: 2017-04-23 | Payer: PPO | Source: Ambulatory Visit | Admitting: Thoracic Surgery (Cardiothoracic Vascular Surgery)

## 2017-04-23 SURGERY — VIDEO ASSISTED THORACOSCOPY (VATS)/ LOBECTOMY
Anesthesia: General | Site: Chest | Laterality: Left

## 2017-05-03 DIAGNOSIS — R911 Solitary pulmonary nodule: Secondary | ICD-10-CM | POA: Diagnosis not present

## 2017-05-03 DIAGNOSIS — C3412 Malignant neoplasm of upper lobe, left bronchus or lung: Secondary | ICD-10-CM | POA: Diagnosis not present

## 2017-05-07 DIAGNOSIS — R918 Other nonspecific abnormal finding of lung field: Secondary | ICD-10-CM | POA: Diagnosis not present

## 2017-05-07 DIAGNOSIS — C3490 Malignant neoplasm of unspecified part of unspecified bronchus or lung: Secondary | ICD-10-CM | POA: Diagnosis not present

## 2017-05-07 DIAGNOSIS — Z01810 Encounter for preprocedural cardiovascular examination: Secondary | ICD-10-CM | POA: Diagnosis not present

## 2017-05-07 DIAGNOSIS — Z01818 Encounter for other preprocedural examination: Secondary | ICD-10-CM | POA: Diagnosis not present

## 2017-05-07 DIAGNOSIS — C3412 Malignant neoplasm of upper lobe, left bronchus or lung: Secondary | ICD-10-CM | POA: Diagnosis not present

## 2017-05-09 DIAGNOSIS — C3412 Malignant neoplasm of upper lobe, left bronchus or lung: Secondary | ICD-10-CM | POA: Diagnosis not present

## 2017-05-09 DIAGNOSIS — F17210 Nicotine dependence, cigarettes, uncomplicated: Secondary | ICD-10-CM | POA: Diagnosis not present

## 2017-05-23 ENCOUNTER — Telehealth: Payer: Self-pay | Admitting: *Deleted

## 2017-05-23 NOTE — Telephone Encounter (Signed)
Oncology Nurse Navigator Documentation  Oncology Nurse Navigator Flowsheets 05/23/2017  Navigator Location [facility]  Navigator Encounter Type Telephone/I called to follow up on Mr. Shiraishi.  Patient's daughter answered the phone. She stated he is doing well and going to have surgery next week at [facility].  I told her I was glad he was getting treatment and that he was doing ok.   Telephone Outgoing Call  Patient Visit Type Follow-up  Treatment Phase Pre-Tx/Tx Discussion  Barriers/Navigation Needs Coordination of Care  Interventions Coordination of Care  Coordination of Care Other  Acuity Level 1  Time Spent with Patient 15

## 2017-05-25 DIAGNOSIS — E1122 Type 2 diabetes mellitus with diabetic chronic kidney disease: Secondary | ICD-10-CM | POA: Diagnosis not present

## 2017-05-25 DIAGNOSIS — C3412 Malignant neoplasm of upper lobe, left bronchus or lung: Secondary | ICD-10-CM | POA: Diagnosis not present

## 2017-05-25 DIAGNOSIS — K219 Gastro-esophageal reflux disease without esophagitis: Secondary | ICD-10-CM | POA: Diagnosis not present

## 2017-05-25 DIAGNOSIS — N183 Chronic kidney disease, stage 3 (moderate): Secondary | ICD-10-CM | POA: Diagnosis not present

## 2017-05-25 DIAGNOSIS — I129 Hypertensive chronic kidney disease with stage 1 through stage 4 chronic kidney disease, or unspecified chronic kidney disease: Secondary | ICD-10-CM | POA: Diagnosis not present

## 2017-05-25 DIAGNOSIS — J9383 Other pneumothorax: Secondary | ICD-10-CM | POA: Diagnosis not present

## 2017-05-25 DIAGNOSIS — J95812 Postprocedural air leak: Secondary | ICD-10-CM | POA: Diagnosis not present

## 2017-05-31 DIAGNOSIS — E1122 Type 2 diabetes mellitus with diabetic chronic kidney disease: Secondary | ICD-10-CM | POA: Diagnosis not present

## 2017-05-31 DIAGNOSIS — J982 Interstitial emphysema: Secondary | ICD-10-CM | POA: Diagnosis not present

## 2017-05-31 DIAGNOSIS — I491 Atrial premature depolarization: Secondary | ICD-10-CM | POA: Diagnosis not present

## 2017-05-31 DIAGNOSIS — Z902 Acquired absence of lung [part of]: Secondary | ICD-10-CM | POA: Diagnosis not present

## 2017-05-31 DIAGNOSIS — R918 Other nonspecific abnormal finding of lung field: Secondary | ICD-10-CM | POA: Diagnosis not present

## 2017-05-31 DIAGNOSIS — R9431 Abnormal electrocardiogram [ECG] [EKG]: Secondary | ICD-10-CM | POA: Diagnosis not present

## 2017-05-31 DIAGNOSIS — J95812 Postprocedural air leak: Secondary | ICD-10-CM | POA: Diagnosis not present

## 2017-05-31 DIAGNOSIS — G8912 Acute post-thoracotomy pain: Secondary | ICD-10-CM | POA: Diagnosis not present

## 2017-05-31 DIAGNOSIS — I129 Hypertensive chronic kidney disease with stage 1 through stage 4 chronic kidney disease, or unspecified chronic kidney disease: Secondary | ICD-10-CM | POA: Diagnosis not present

## 2017-05-31 DIAGNOSIS — T8182XA Emphysema (subcutaneous) resulting from a procedure, initial encounter: Secondary | ICD-10-CM | POA: Diagnosis not present

## 2017-05-31 DIAGNOSIS — R000 Tachycardia, unspecified: Secondary | ICD-10-CM | POA: Diagnosis not present

## 2017-05-31 DIAGNOSIS — J90 Pleural effusion, not elsewhere classified: Secondary | ICD-10-CM | POA: Diagnosis not present

## 2017-05-31 DIAGNOSIS — Z483 Aftercare following surgery for neoplasm: Secondary | ICD-10-CM | POA: Diagnosis not present

## 2017-05-31 DIAGNOSIS — J95811 Postprocedural pneumothorax: Secondary | ICD-10-CM | POA: Diagnosis not present

## 2017-05-31 DIAGNOSIS — N183 Chronic kidney disease, stage 3 (moderate): Secondary | ICD-10-CM | POA: Diagnosis not present

## 2017-05-31 DIAGNOSIS — Z938 Other artificial opening status: Secondary | ICD-10-CM | POA: Diagnosis not present

## 2017-05-31 DIAGNOSIS — Z978 Presence of other specified devices: Secondary | ICD-10-CM | POA: Diagnosis not present

## 2017-05-31 DIAGNOSIS — K219 Gastro-esophageal reflux disease without esophagitis: Secondary | ICD-10-CM | POA: Diagnosis not present

## 2017-05-31 DIAGNOSIS — C3412 Malignant neoplasm of upper lobe, left bronchus or lung: Secondary | ICD-10-CM | POA: Diagnosis not present

## 2017-05-31 DIAGNOSIS — J9382 Other air leak: Secondary | ICD-10-CM | POA: Diagnosis not present

## 2017-05-31 DIAGNOSIS — G8918 Other acute postprocedural pain: Secondary | ICD-10-CM | POA: Diagnosis not present

## 2017-05-31 DIAGNOSIS — J9383 Other pneumothorax: Secondary | ICD-10-CM | POA: Diagnosis not present

## 2017-05-31 DIAGNOSIS — Z9689 Presence of other specified functional implants: Secondary | ICD-10-CM | POA: Diagnosis not present

## 2017-05-31 DIAGNOSIS — J939 Pneumothorax, unspecified: Secondary | ICD-10-CM | POA: Diagnosis not present

## 2017-07-03 DIAGNOSIS — C3412 Malignant neoplasm of upper lobe, left bronchus or lung: Secondary | ICD-10-CM | POA: Diagnosis not present

## 2017-07-03 DIAGNOSIS — C782 Secondary malignant neoplasm of pleura: Secondary | ICD-10-CM | POA: Diagnosis not present

## 2017-07-05 ENCOUNTER — Encounter: Payer: Self-pay | Admitting: *Deleted

## 2017-07-05 ENCOUNTER — Other Ambulatory Visit: Payer: Self-pay | Admitting: *Deleted

## 2017-07-05 NOTE — Patient Outreach (Signed)
Secretary [facility]) Care Management  07/05/2017  Joseph [name] [birth year] [id number]  Received referral via [place] member discharged from [facility]. [facility] in [place], [place] 06/24/2017.  Per chart review: Admission 5/02-5/26/2019 at [facility] in [place] Dx: non small cell lung cancer left upper lobe lung Surgery: Thoracotomy ; lobectomy upper lobe lung left, mediastinal lymph lobe dissection Nonsmoker Patient discharged to home with family support.  Telephone call to patient who allowed daughter to talk with this RN care coordinator regarding health concerns. HIPPA verification received from patient as well as from daughter-Joseph [name]. Caregiver was advised of [facility] care management  Spoke with Joseph [name]-daughter who is patient's caregiver. States patient's has grand daughter who is RN who also assists with his care.   Daughter/caregiver states patient is doing well and is independent in his self personal care. States she assist with transportation, fixing meals, managing medications, running of household and paying bills.  States appointments for follow up have been made for 6/17 with primary care-Dr. Marisue Humble and with Surgeon-Dr Elba Barman del on 07/20/2017.   States patient is walking daily and getting stronger. States he is following the activity instructions that were recommended at discharge.    States she nor patient has any health care concerns at this time. States no needs to [facility] services at this time. Caregiver consents to receiving contact information for [facility].  Plan: Send [facility] contact information. Close case.  Sherrin Daisy, RN BSN [place] Management Coordinator [facility] Care Management  [phone]

## 2017-07-08 DIAGNOSIS — C782 Secondary malignant neoplasm of pleura: Secondary | ICD-10-CM | POA: Diagnosis not present

## 2017-07-08 DIAGNOSIS — C3412 Malignant neoplasm of upper lobe, left bronchus or lung: Secondary | ICD-10-CM | POA: Diagnosis not present

## 2017-07-16 DIAGNOSIS — E7800 Pure hypercholesterolemia, unspecified: Secondary | ICD-10-CM | POA: Diagnosis not present

## 2017-07-16 DIAGNOSIS — C3490 Malignant neoplasm of unspecified part of unspecified bronchus or lung: Secondary | ICD-10-CM | POA: Diagnosis not present

## 2017-07-16 DIAGNOSIS — I700 Atherosclerosis of aorta: Secondary | ICD-10-CM | POA: Diagnosis not present

## 2017-07-16 DIAGNOSIS — J310 Chronic rhinitis: Secondary | ICD-10-CM | POA: Diagnosis not present

## 2017-07-16 DIAGNOSIS — I10 Essential (primary) hypertension: Secondary | ICD-10-CM | POA: Diagnosis not present

## 2017-07-16 DIAGNOSIS — E119 Type 2 diabetes mellitus without complications: Secondary | ICD-10-CM | POA: Diagnosis not present

## 2017-07-16 DIAGNOSIS — D692 Other nonthrombocytopenic purpura: Secondary | ICD-10-CM | POA: Diagnosis not present

## 2017-07-16 DIAGNOSIS — K219 Gastro-esophageal reflux disease without esophagitis: Secondary | ICD-10-CM | POA: Diagnosis not present

## 2017-07-20 DIAGNOSIS — Z09 Encounter for follow-up examination after completed treatment for conditions other than malignant neoplasm: Secondary | ICD-10-CM | POA: Diagnosis not present

## 2017-07-20 DIAGNOSIS — J948 Other specified pleural conditions: Secondary | ICD-10-CM | POA: Diagnosis not present

## 2017-07-20 DIAGNOSIS — J90 Pleural effusion, not elsewhere classified: Secondary | ICD-10-CM | POA: Diagnosis not present

## 2017-07-20 DIAGNOSIS — C3492 Malignant neoplasm of unspecified part of left bronchus or lung: Secondary | ICD-10-CM | POA: Diagnosis not present

## 2017-07-20 DIAGNOSIS — Z902 Acquired absence of lung [part of]: Secondary | ICD-10-CM | POA: Diagnosis not present

## 2017-07-30 DIAGNOSIS — J90 Pleural effusion, not elsewhere classified: Secondary | ICD-10-CM | POA: Diagnosis not present

## 2017-11-19 DIAGNOSIS — H6123 Impacted cerumen, bilateral: Secondary | ICD-10-CM | POA: Diagnosis not present

## 2017-12-14 DIAGNOSIS — Z96642 Presence of left artificial hip joint: Secondary | ICD-10-CM | POA: Diagnosis not present

## 2017-12-14 DIAGNOSIS — Z471 Aftercare following joint replacement surgery: Secondary | ICD-10-CM | POA: Diagnosis not present

## 2017-12-14 DIAGNOSIS — M7061 Trochanteric bursitis, right hip: Secondary | ICD-10-CM | POA: Diagnosis not present

## 2017-12-14 DIAGNOSIS — M5416 Radiculopathy, lumbar region: Secondary | ICD-10-CM | POA: Diagnosis not present

## 2017-12-26 DIAGNOSIS — E7800 Pure hypercholesterolemia, unspecified: Secondary | ICD-10-CM | POA: Diagnosis not present

## 2017-12-26 DIAGNOSIS — Z85118 Personal history of other malignant neoplasm of bronchus and lung: Secondary | ICD-10-CM | POA: Diagnosis not present

## 2017-12-26 DIAGNOSIS — K219 Gastro-esophageal reflux disease without esophagitis: Secondary | ICD-10-CM | POA: Diagnosis not present

## 2017-12-26 DIAGNOSIS — E119 Type 2 diabetes mellitus without complications: Secondary | ICD-10-CM | POA: Diagnosis not present

## 2017-12-26 DIAGNOSIS — J310 Chronic rhinitis: Secondary | ICD-10-CM | POA: Diagnosis not present

## 2017-12-26 DIAGNOSIS — D692 Other nonthrombocytopenic purpura: Secondary | ICD-10-CM | POA: Diagnosis not present

## 2017-12-26 DIAGNOSIS — Z1389 Encounter for screening for other disorder: Secondary | ICD-10-CM | POA: Diagnosis not present

## 2017-12-26 DIAGNOSIS — I700 Atherosclerosis of aorta: Secondary | ICD-10-CM | POA: Diagnosis not present

## 2017-12-26 DIAGNOSIS — I10 Essential (primary) hypertension: Secondary | ICD-10-CM | POA: Diagnosis not present

## 2017-12-26 DIAGNOSIS — Z0000 Encounter for general adult medical examination without abnormal findings: Secondary | ICD-10-CM | POA: Diagnosis not present

## 2018-01-18 DIAGNOSIS — Z85118 Personal history of other malignant neoplasm of bronchus and lung: Secondary | ICD-10-CM | POA: Diagnosis not present

## 2018-01-18 DIAGNOSIS — Z902 Acquired absence of lung [part of]: Secondary | ICD-10-CM | POA: Diagnosis not present

## 2018-01-18 DIAGNOSIS — Z08 Encounter for follow-up examination after completed treatment for malignant neoplasm: Secondary | ICD-10-CM | POA: Diagnosis not present

## 2018-01-18 DIAGNOSIS — C3412 Malignant neoplasm of upper lobe, left bronchus or lung: Secondary | ICD-10-CM | POA: Diagnosis not present

## 2018-02-06 DIAGNOSIS — H40013 Open angle with borderline findings, low risk, bilateral: Secondary | ICD-10-CM | POA: Diagnosis not present

## 2018-02-06 DIAGNOSIS — Z961 Presence of intraocular lens: Secondary | ICD-10-CM | POA: Diagnosis not present

## 2018-02-06 DIAGNOSIS — R7303 Prediabetes: Secondary | ICD-10-CM | POA: Diagnosis not present

## 2018-02-08 DIAGNOSIS — Z961 Presence of intraocular lens: Secondary | ICD-10-CM | POA: Diagnosis not present

## 2018-07-01 DIAGNOSIS — K219 Gastro-esophageal reflux disease without esophagitis: Secondary | ICD-10-CM | POA: Diagnosis not present

## 2018-07-01 DIAGNOSIS — I700 Atherosclerosis of aorta: Secondary | ICD-10-CM | POA: Diagnosis not present

## 2018-07-01 DIAGNOSIS — I10 Essential (primary) hypertension: Secondary | ICD-10-CM | POA: Diagnosis not present

## 2018-07-01 DIAGNOSIS — J310 Chronic rhinitis: Secondary | ICD-10-CM | POA: Diagnosis not present

## 2018-07-01 DIAGNOSIS — E1169 Type 2 diabetes mellitus with other specified complication: Secondary | ICD-10-CM | POA: Diagnosis not present

## 2018-07-01 DIAGNOSIS — Z85118 Personal history of other malignant neoplasm of bronchus and lung: Secondary | ICD-10-CM | POA: Diagnosis not present

## 2018-07-01 DIAGNOSIS — E7800 Pure hypercholesterolemia, unspecified: Secondary | ICD-10-CM | POA: Diagnosis not present

## 2018-07-19 DIAGNOSIS — R5383 Other fatigue: Secondary | ICD-10-CM | POA: Diagnosis not present

## 2018-07-19 DIAGNOSIS — R918 Other nonspecific abnormal finding of lung field: Secondary | ICD-10-CM | POA: Diagnosis not present

## 2018-07-19 DIAGNOSIS — C3412 Malignant neoplasm of upper lobe, left bronchus or lung: Secondary | ICD-10-CM | POA: Diagnosis not present

## 2018-07-19 DIAGNOSIS — Z902 Acquired absence of lung [part of]: Secondary | ICD-10-CM | POA: Diagnosis not present

## 2018-07-19 DIAGNOSIS — C3492 Malignant neoplasm of unspecified part of left bronchus or lung: Secondary | ICD-10-CM | POA: Diagnosis not present

## 2018-08-07 ENCOUNTER — Other Ambulatory Visit: Payer: Self-pay

## 2018-08-07 ENCOUNTER — Ambulatory Visit
Admission: RE | Admit: 2018-08-07 | Discharge: 2018-08-07 | Disposition: A | Discharge status: Home / Self Care | Payer: PPO | Source: Ambulatory Visit | Attending: Family Medicine | Admitting: Family Medicine

## 2018-08-07 ENCOUNTER — Other Ambulatory Visit: Payer: Self-pay | Admitting: Family Medicine

## 2018-08-07 DIAGNOSIS — R059 Cough, unspecified: Secondary | ICD-10-CM

## 2018-08-07 DIAGNOSIS — R05 Cough: Secondary | ICD-10-CM | POA: Diagnosis not present

## 2018-10-08 DIAGNOSIS — M25551 Pain in right hip: Secondary | ICD-10-CM | POA: Diagnosis not present

## 2018-10-08 DIAGNOSIS — Z96642 Presence of left artificial hip joint: Secondary | ICD-10-CM | POA: Diagnosis not present

## 2018-11-23 ENCOUNTER — Encounter (INDEPENDENT_AMBULATORY_CARE_PROVIDER_SITE_OTHER): Payer: Self-pay

## 2019-01-01 DIAGNOSIS — K219 Gastro-esophageal reflux disease without esophagitis: Secondary | ICD-10-CM | POA: Diagnosis not present

## 2019-01-01 DIAGNOSIS — D692 Other nonthrombocytopenic purpura: Secondary | ICD-10-CM | POA: Diagnosis not present

## 2019-01-01 DIAGNOSIS — J310 Chronic rhinitis: Secondary | ICD-10-CM | POA: Diagnosis not present

## 2019-01-01 DIAGNOSIS — I10 Essential (primary) hypertension: Secondary | ICD-10-CM | POA: Diagnosis not present

## 2019-01-01 DIAGNOSIS — Z0000 Encounter for general adult medical examination without abnormal findings: Secondary | ICD-10-CM | POA: Diagnosis not present

## 2019-01-01 DIAGNOSIS — I700 Atherosclerosis of aorta: Secondary | ICD-10-CM | POA: Diagnosis not present

## 2019-01-01 DIAGNOSIS — Z1389 Encounter for screening for other disorder: Secondary | ICD-10-CM | POA: Diagnosis not present

## 2019-01-01 DIAGNOSIS — Z85118 Personal history of other malignant neoplasm of bronchus and lung: Secondary | ICD-10-CM | POA: Diagnosis not present

## 2019-01-01 DIAGNOSIS — E1169 Type 2 diabetes mellitus with other specified complication: Secondary | ICD-10-CM | POA: Diagnosis not present

## 2019-01-01 DIAGNOSIS — E7800 Pure hypercholesterolemia, unspecified: Secondary | ICD-10-CM | POA: Diagnosis not present

## 2019-01-17 DIAGNOSIS — C3412 Malignant neoplasm of upper lobe, left bronchus or lung: Secondary | ICD-10-CM | POA: Diagnosis not present

## 2019-01-17 DIAGNOSIS — Z9889 Other specified postprocedural states: Secondary | ICD-10-CM | POA: Diagnosis not present

## 2019-01-17 DIAGNOSIS — Z902 Acquired absence of lung [part of]: Secondary | ICD-10-CM | POA: Diagnosis not present

## 2019-03-30 ENCOUNTER — Ambulatory Visit: Payer: PPO | Attending: Internal Medicine

## 2019-03-30 DIAGNOSIS — Z23 Encounter for immunization: Secondary | ICD-10-CM

## 2019-03-30 NOTE — Progress Notes (Signed)
   Covid-19 Vaccination Clinic  Name:  Joseph [name]    MRN: [id number] DOB: 1931-04-16  03/30/2019  [name] was observed post Covid-19 immunization for 15 minutes without incidence. He was provided with Vaccine Information Sheet and instruction to access the V-Safe system.   Mr. Bean was instructed to call 911 with any severe reactions post vaccine: Marland Kitchen Difficulty breathing  . Swelling of your face and throat  . A fast heartbeat  . A bad rash all over your body  . Dizziness and weakness    Immunizations Administered    Name Date Dose VIS Date Route   Pfizer COVID-19 Vaccine 03/30/2019  1:39 PM 0.3 mL 01/10/2019 Intramuscular   Manufacturer: [place]   Lot: [id number]   [place]: [id number]

## 2019-04-28 ENCOUNTER — Other Ambulatory Visit: Payer: Self-pay

## 2019-04-28 ENCOUNTER — Encounter (HOSPITAL_BASED_OUTPATIENT_CLINIC_OR_DEPARTMENT_OTHER): Payer: Self-pay | Admitting: *Deleted

## 2019-04-28 ENCOUNTER — Emergency Department (HOSPITAL_BASED_OUTPATIENT_CLINIC_OR_DEPARTMENT_OTHER)
Admission: EM | Admit: 2019-04-28 | Discharge: 2019-04-28 | Disposition: A | Discharge status: Home / Self Care | Payer: PPO | Attending: Emergency Medicine | Admitting: Emergency Medicine

## 2019-04-28 DIAGNOSIS — Z03818 Encounter for observation for suspected exposure to other biological agents ruled out: Secondary | ICD-10-CM | POA: Diagnosis not present

## 2019-04-28 DIAGNOSIS — Z96642 Presence of left artificial hip joint: Secondary | ICD-10-CM | POA: Diagnosis not present

## 2019-04-28 DIAGNOSIS — Z8546 Personal history of malignant neoplasm of prostate: Secondary | ICD-10-CM | POA: Diagnosis not present

## 2019-04-28 DIAGNOSIS — I10 Essential (primary) hypertension: Secondary | ICD-10-CM | POA: Diagnosis not present

## 2019-04-28 DIAGNOSIS — Z96653 Presence of artificial knee joint, bilateral: Secondary | ICD-10-CM | POA: Insufficient documentation

## 2019-04-28 DIAGNOSIS — U071 COVID-19: Secondary | ICD-10-CM | POA: Diagnosis not present

## 2019-04-28 DIAGNOSIS — E119 Type 2 diabetes mellitus without complications: Secondary | ICD-10-CM | POA: Insufficient documentation

## 2019-04-28 DIAGNOSIS — R05 Cough: Secondary | ICD-10-CM | POA: Diagnosis present

## 2019-04-28 DIAGNOSIS — Z20828 Contact with and (suspected) exposure to other viral communicable diseases: Secondary | ICD-10-CM | POA: Diagnosis not present

## 2019-04-28 NOTE — ED Provider Notes (Signed)
[facility] Emergency Department Provider Note MRN:  [id number]  Arrival date & time: 04/28/19     Chief Complaint   Covid Exposure   History of Present Illness   Joseph [name] is a 86 y.o. year-old male with a history of hypertension, diabetes presenting to the ED with chief complaint of Covid exposure.  Patient tested positive for Covid today.  Has had cough for 2 days.  No other symptoms.  Denies shortness of breath, no chest pain, no headache, no vomiting, no diarrhea.  Symptoms are mild, constant, no exacerbating or alleviating factors.  Review of Systems  A complete 10 system review of systems was obtained and all systems are negative except as noted in the HPI and PMH.   Patient's Health History    Past Medical History:  Diagnosis Date  . Arthritis   . Cancer ([place])    HX PROSTATE CANCER - MANY YRS AGO - ? RADIATION [place]. Lung cancer  . Diabetes mellitus without complication (HCC)    DIET CONTROLLED  . GERD (gastroesophageal reflux disease)   . Hearing loss   . History of hiatal hernia   . History of kidney stones   . History of nonunion of fracture    LEFT PROXIMAL FEMUR  . Hypertension   . Pneumonia   . Seasonal allergies     Past Surgical History:  Procedure Laterality Date  . CONVERSION TO TOTAL HIP Left 03/19/2014   Procedure: CONVERSION OF PREVIOUS HIP SURGERY TO LEFT TOTAL HIP ARTHROPLASTY;  Surgeon: Gearlean Alf, MD;  Location: [facility];  Service: Orthopedics;  Laterality: Left;  . FEMUR IM NAIL  01/18/2012   Procedure: INTRAMEDULLARY (IM) NAIL FEMORAL;  Surgeon: Gearlean Alf, MD;  Location: [facility];  Service: Orthopedics;  Laterality: Left;  . HARDWARE REMOVAL Left 01/13/2013   Procedure: HARDWARE REVISION LEFT HIP AND EXCHANGE OF COMPRESSION SCREW;  Surgeon: Gearlean Alf, MD;  Location: [facility];  Service: Orthopedics;  Laterality: Left;  . HELLER MYOTOMY    . HIATAL HERNIA REPAIR  2011  . JOINT REPLACEMENT  12/2011   BIL KNEE REPLACEMENTS  . LOBECTOMY Left    left upper lobe  . THORACOSCOPY    . VIDEO BRONCHOSCOPY WITH ENDOBRONCHIAL NAVIGATION N/A 03/21/2017   Procedure: VIDEO BRONCHOSCOPY WITH ENDOBRONCHIAL NAVIGATION;  Surgeon: Melrose Nakayama, MD;  Location: [facility] OR;  Service: Thoracic;  Laterality: N/A;    Family History  Problem Relation Age of Onset  . Heart attack Mother   . Hypertension Mother   . Heart attack Father   . Hypertension Father   . Colon cancer Neg Hx   . Esophageal cancer Neg Hx   . Rectal cancer Neg Hx   . Stomach cancer Neg Hx     Social History   Socioeconomic History  . Marital status: Widowed    Spouse name: Not on file  . Number of children: Not on file  . Years of education: Not on file  . Highest education level: Not on file  Occupational History  . Not on file  Tobacco Use  . Smoking status: Never Smoker  . Smokeless tobacco: Never Used  Substance and Sexual Activity  . Alcohol use: No  . Drug use: No  . Sexual activity: Not on file  Other Topics Concern  . Not on file  Social History Narrative  . Not on file   Social Determinants of Health   Financial Resource Strain:   . Difficulty  of Paying Living Expenses:   Food Insecurity:   . Worried About Charity fundraiser in the Last Year:   . Arboriculturist in the Last Year:   Transportation Needs:   . Film/video editor (Medical):   Marland Kitchen Lack of Transportation (Non-Medical):   Physical Activity:   . Days of Exercise per Week:   . Minutes of Exercise per Session:   Stress:   . Feeling of Stress :   Social Connections:   . Frequency of Communication with Friends and Family:   . Frequency of Social Gatherings with Friends and Family:   . Attends Religious Services:   . Active Member of Clubs or Organizations:   . Attends Archivist Meetings:   Marland Kitchen Marital Status:   Intimate Partner Violence:   . Fear of Current or Ex-Partner:   . Emotionally Abused:   Marland Kitchen Physically Abused:   .  Sexually Abused:      Physical Exam   Vitals:   04/28/19 1838  BP: (!) 204/122  Pulse: 78  Resp: 18  Temp: 98.2 F (36.8 C)  SpO2: 100%    CONSTITUTIONAL: Well-appearing, NAD NEURO:  Alert and oriented x 3, no focal deficits EYES:  eyes equal and reactive ENT/NECK:  no LAD, no JVD CARDIO: Regular rate, well-perfused, normal S1 and S2 PULM:  CTAB no wheezing or rhonchi GI/GU:  normal bowel sounds, non-distended, non-tender MSK/SPINE:  No gross deformities, no edema SKIN:  no rash, atraumatic PSYCH:  Appropriate speech and behavior  *Additional and/or pertinent findings included in MDM below  Diagnostic and Interventional Summary    EKG Interpretation  Date/Time:    Ventricular Rate:    PR Interval:    QRS Duration:   QT Interval:    QTC Calculation:   R Axis:     Text Interpretation:        Labs Reviewed - No data to display  No orders to display    Medications - No data to display   Procedures  /  Critical Care Procedures  ED Course and Medical Decision Making  I have reviewed the triage vital signs, the nursing notes, and pertinent available records from the EMR.  Pertinent labs & imaging results that were available during my care of the patient were reviewed by me and considered in my medical decision making (see below for details).     No tachypnea, normal vital signs, no increased work of breathing, no accessory muscle use, lungs are clear.  No indication for chest x-ray or testing here in the emergency department.  Confirm Covid earlier today.  Multiple family members recently testing positive as well.  Appropriate for home quarantine, strict return precautions for worsening symptoms this patient is only on day 2 of illness.  Joseph [name] was evaluated in Emergency Department on 04/28/2019 for the symptoms described in the history of present illness. He was evaluated in the context of the global COVID-19 pandemic, which necessitated consideration that  the patient might be at risk for infection with the SARS-CoV-2 virus that causes COVID-19. Institutional protocols and algorithms that pertain to the evaluation of patients at risk for COVID-19 are in a state of rapid change based on information released by regulatory bodies including the CDC and federal and state organizations. These policies and algorithms were followed during the patient's care in the ED.     Barth Kirks. [place]onia Small, [place] [email or web address]  Final Clinical  Impressions(s) / ED Diagnoses     ICD-10-CM   1. COVID-19  U07.1     ED Discharge Orders    None       Discharge Instructions Discussed with and Provided to Patient:     Discharge Instructions     You were evaluated in the Emergency Department and after careful evaluation, we did not find any emergent condition requiring admission or further testing in the hospital.  Your exam/testing today is overall reassuring.  Your symptoms seem to be due to COVID-19.  Please continue home quarantine for the next 2 weeks and keep a close eye on your symptoms.  With any shortness of breath, please return to the emergency department.  We recommend that you discuss the possibility of monoclonal antibody therapy with your primary care doctor.  You could also call the infectious disease expert provided for recommendations regarding this therapy.  Please return to the Emergency Department if you experience any worsening of your condition.  We encourage you to follow up with a primary care provider.  Thank you for allowing [place] to be a part of your care.      Maudie Flakes, MD 04/28/19 [phone]

## 2019-04-28 NOTE — ED Triage Notes (Signed)
Tested positive for covid today. Reports cough for 2 days. Denies fever. Reports slight SOB. No cough noted in triage. No acute distress noted. Pt speaking in complete sentences, no tachypnea.

## 2019-04-28 NOTE — Discharge Instructions (Addendum)
You were evaluated in the Emergency Department and after careful evaluation, we did not find any emergent condition requiring admission or further testing in the hospital.  Your exam/testing today is overall reassuring.  Your symptoms seem to be due to COVID-19.  Please continue home quarantine for the next 2 weeks and keep a close eye on your symptoms.  With any shortness of breath, please return to the emergency department.  We recommend that you discuss the possibility of monoclonal antibody therapy with your primary care doctor.  You could also call the infectious disease expert provided for recommendations regarding this therapy.  Please return to the Emergency Department if you experience any worsening of your condition.  We encourage you to follow up with a primary care provider.  Thank you for allowing [place] to be a part of your care.

## 2019-04-29 ENCOUNTER — Ambulatory Visit (HOSPITAL_COMMUNITY)
Admission: RE | Admit: 2019-04-29 | Discharge: 2019-04-29 | Disposition: A | Discharge status: Home / Self Care | Payer: Medicare Other | Source: Ambulatory Visit | Attending: Pulmonary Disease | Admitting: Pulmonary Disease

## 2019-04-29 ENCOUNTER — Other Ambulatory Visit: Payer: Self-pay | Admitting: Unknown Physician Specialty

## 2019-04-29 ENCOUNTER — Telehealth: Payer: Self-pay | Admitting: Unknown Physician Specialty

## 2019-04-29 DIAGNOSIS — I10 Essential (primary) hypertension: Secondary | ICD-10-CM

## 2019-04-29 DIAGNOSIS — Z23 Encounter for immunization: Secondary | ICD-10-CM | POA: Diagnosis not present

## 2019-04-29 DIAGNOSIS — U071 COVID-19: Secondary | ICD-10-CM

## 2019-04-29 MED ORDER — DIPHENHYDRAMINE HCL 50 MG/ML IJ SOLN: 50.0000 mg | Freq: Once | INTRAMUSCULAR | Status: DC | PRN

## 2019-04-29 MED ORDER — SODIUM CHLORIDE 0.9 % IV SOLN: INTRAVENOUS | Status: DC | PRN

## 2019-04-29 MED ORDER — ALBUTEROL SULFATE HFA 108 (90 BASE) MCG/ACT IN AERS: 2.0000 | INHALATION_SPRAY | Freq: Once | RESPIRATORY_TRACT | Status: DC | PRN

## 2019-04-29 MED ORDER — EPINEPHRINE 0.3 MG/0.3ML IJ SOAJ: 0.3000 mg | Freq: Once | INTRAMUSCULAR | Status: DC | PRN

## 2019-04-29 MED ORDER — FAMOTIDINE IN NACL 20-0.9 MG/50ML-% IV SOLN: 20.0000 mg | Freq: Once | INTRAVENOUS | Status: DC | PRN

## 2019-04-29 MED ORDER — METHYLPREDNISOLONE SODIUM SUCC 125 MG IJ SOLR: 125.0000 mg | Freq: Once | INTRAMUSCULAR | Status: DC | PRN

## 2019-04-29 MED ORDER — SODIUM CHLORIDE 0.9 % IV SOLN
700.0000 mg | Freq: Once | INTRAVENOUS | Status: AC
  Administered 2019-04-29: 700 mg via INTRAVENOUS
  Filled 2019-04-29: qty 700

## 2019-04-29 NOTE — Telephone Encounter (Signed)
Called to discuss with patient about Covid symptoms and the use of bamlanivimab, a monoclonal antibody infusion for those with mild to moderate Covid symptoms and at a high risk of hospitalization.  Pt is qualified for this infusion at the [facility] infusion center due to Age > 65   Message left to call back

## 2019-04-29 NOTE — Telephone Encounter (Signed)
  I connected by phone with Joseph [name]'s granddaughter on 04/29/2019 at 9:09 AM to discuss the potential use of an new treatment for mild to moderate COVID-19 viral infection in non-hospitalized patients.  This patient is a 86 y.o. male that meets the FDA criteria for Emergency Use Authorization of bamlanivimab or casirivimab\imdevimab.  Has a (+) direct SARS-CoV-2 viral test result  Has mild or moderate COVID-19   Is ? 84 years of age and weighs ? 40 kg  Is NOT hospitalized due to COVID-19  Is NOT requiring oxygen therapy or requiring an increase in baseline oxygen flow rate due to COVID-19  Is within 10 days of symptom onset  Has at least one of the high risk factor(s) for progression to severe COVID-19 and/or hospitalization as defined in EUA.  Specific high risk criteria : >/= 84 yo   I have spoken and communicated the following to the patient or parent/caregiver:  1. FDA has authorized the emergency use of bamlanivimab and casirivimab\imdevimab for the treatment of mild to moderate COVID-19 in adults and pediatric patients with positive results of direct SARS-CoV-2 viral testing who are 47 years of age and older weighing at least 40 kg, and who are at high risk for progressing to severe COVID-19 and/or hospitalization.  2. The significant known and potential risks and benefits of bamlanivimab and casirivimab\imdevimab, and the extent to which such potential risks and benefits are unknown.  3. Information on available alternative treatments and the risks and benefits of those alternatives, including clinical trials.  4. Patients treated with bamlanivimab and casirivimab\imdevimab should continue to self-isolate and use infection control measures (e.g., wear mask, isolate, social distance, avoid sharing personal items, clean and disinfect "high touch" surfaces, and frequent handwashing) according to CDC guidelines.   5. The patient or parent/caregiver has the option to accept  or refuse bamlanivimab or casirivimab\imdevimab .  After reviewing this information with the patient granddaughter, The patient agreed to proceed with receiving the bamlanimivab infusion and will be provided a copy of the Fact sheet prior to receiving the infusion.Joseph [name] 04/29/2019 9:09 AM

## 2019-04-29 NOTE — Progress Notes (Signed)
  Diagnosis: COVID-19  Physician: Dr. Joya Gaskins  Procedure: [facility] Infusion Clinic Med: bamlanivimab infusion - Provided patient with bamlanimivab fact sheet for patients, parents and caregivers prior to infusion.  Complications: No immediate complications noted.  Discharge: Discharged home   Acquanetta Chain 04/29/2019

## 2019-04-29 NOTE — Discharge Instructions (Signed)

## 2019-04-30 ENCOUNTER — Ambulatory Visit: Payer: PPO

## 2019-07-02 DIAGNOSIS — J310 Chronic rhinitis: Secondary | ICD-10-CM | POA: Diagnosis not present

## 2019-07-02 DIAGNOSIS — D692 Other nonthrombocytopenic purpura: Secondary | ICD-10-CM | POA: Diagnosis not present

## 2019-07-02 DIAGNOSIS — Z85118 Personal history of other malignant neoplasm of bronchus and lung: Secondary | ICD-10-CM | POA: Diagnosis not present

## 2019-07-02 DIAGNOSIS — E7800 Pure hypercholesterolemia, unspecified: Secondary | ICD-10-CM | POA: Diagnosis not present

## 2019-07-02 DIAGNOSIS — E1169 Type 2 diabetes mellitus with other specified complication: Secondary | ICD-10-CM | POA: Diagnosis not present

## 2019-07-02 DIAGNOSIS — I10 Essential (primary) hypertension: Secondary | ICD-10-CM | POA: Diagnosis not present

## 2019-07-02 DIAGNOSIS — I700 Atherosclerosis of aorta: Secondary | ICD-10-CM | POA: Diagnosis not present

## 2019-07-02 DIAGNOSIS — K219 Gastro-esophageal reflux disease without esophagitis: Secondary | ICD-10-CM | POA: Diagnosis not present

## 2019-07-18 DIAGNOSIS — C3412 Malignant neoplasm of upper lobe, left bronchus or lung: Secondary | ICD-10-CM | POA: Diagnosis not present

## 2019-07-18 DIAGNOSIS — Z902 Acquired absence of lung [part of]: Secondary | ICD-10-CM | POA: Diagnosis not present

## 2019-08-15 IMAGING — DX CHEST - 2 VIEW
2 series · 2 of 2 positions shown · non-contrast
Comparison: Radiographs March 21, 2017.

CLINICAL DATA: Cough.  History of lung cancer.

EXAM:
CHEST - 2 VIEW

[dg chest 2 view (1 of 2)]
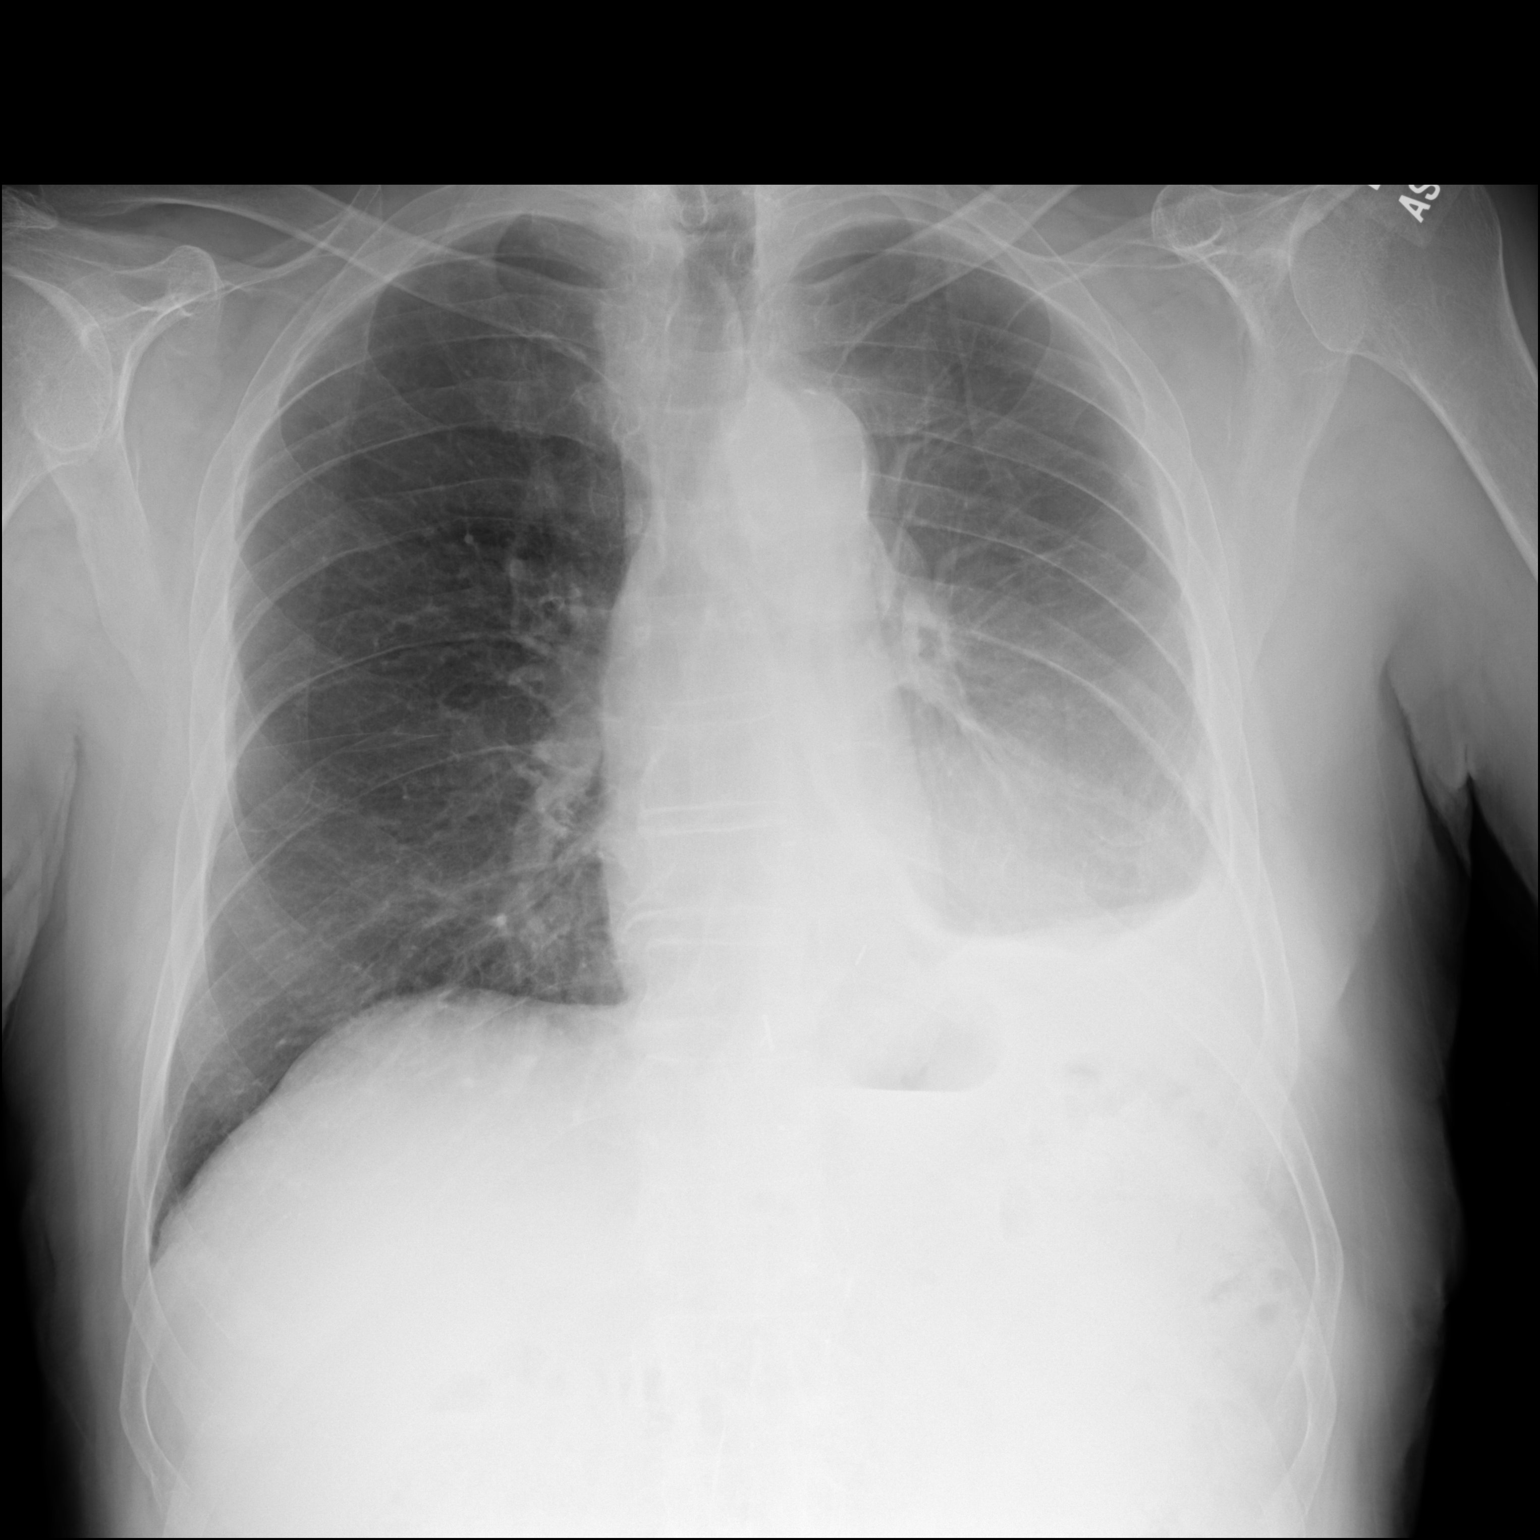

[dg chest 2 view (2 of 2)]
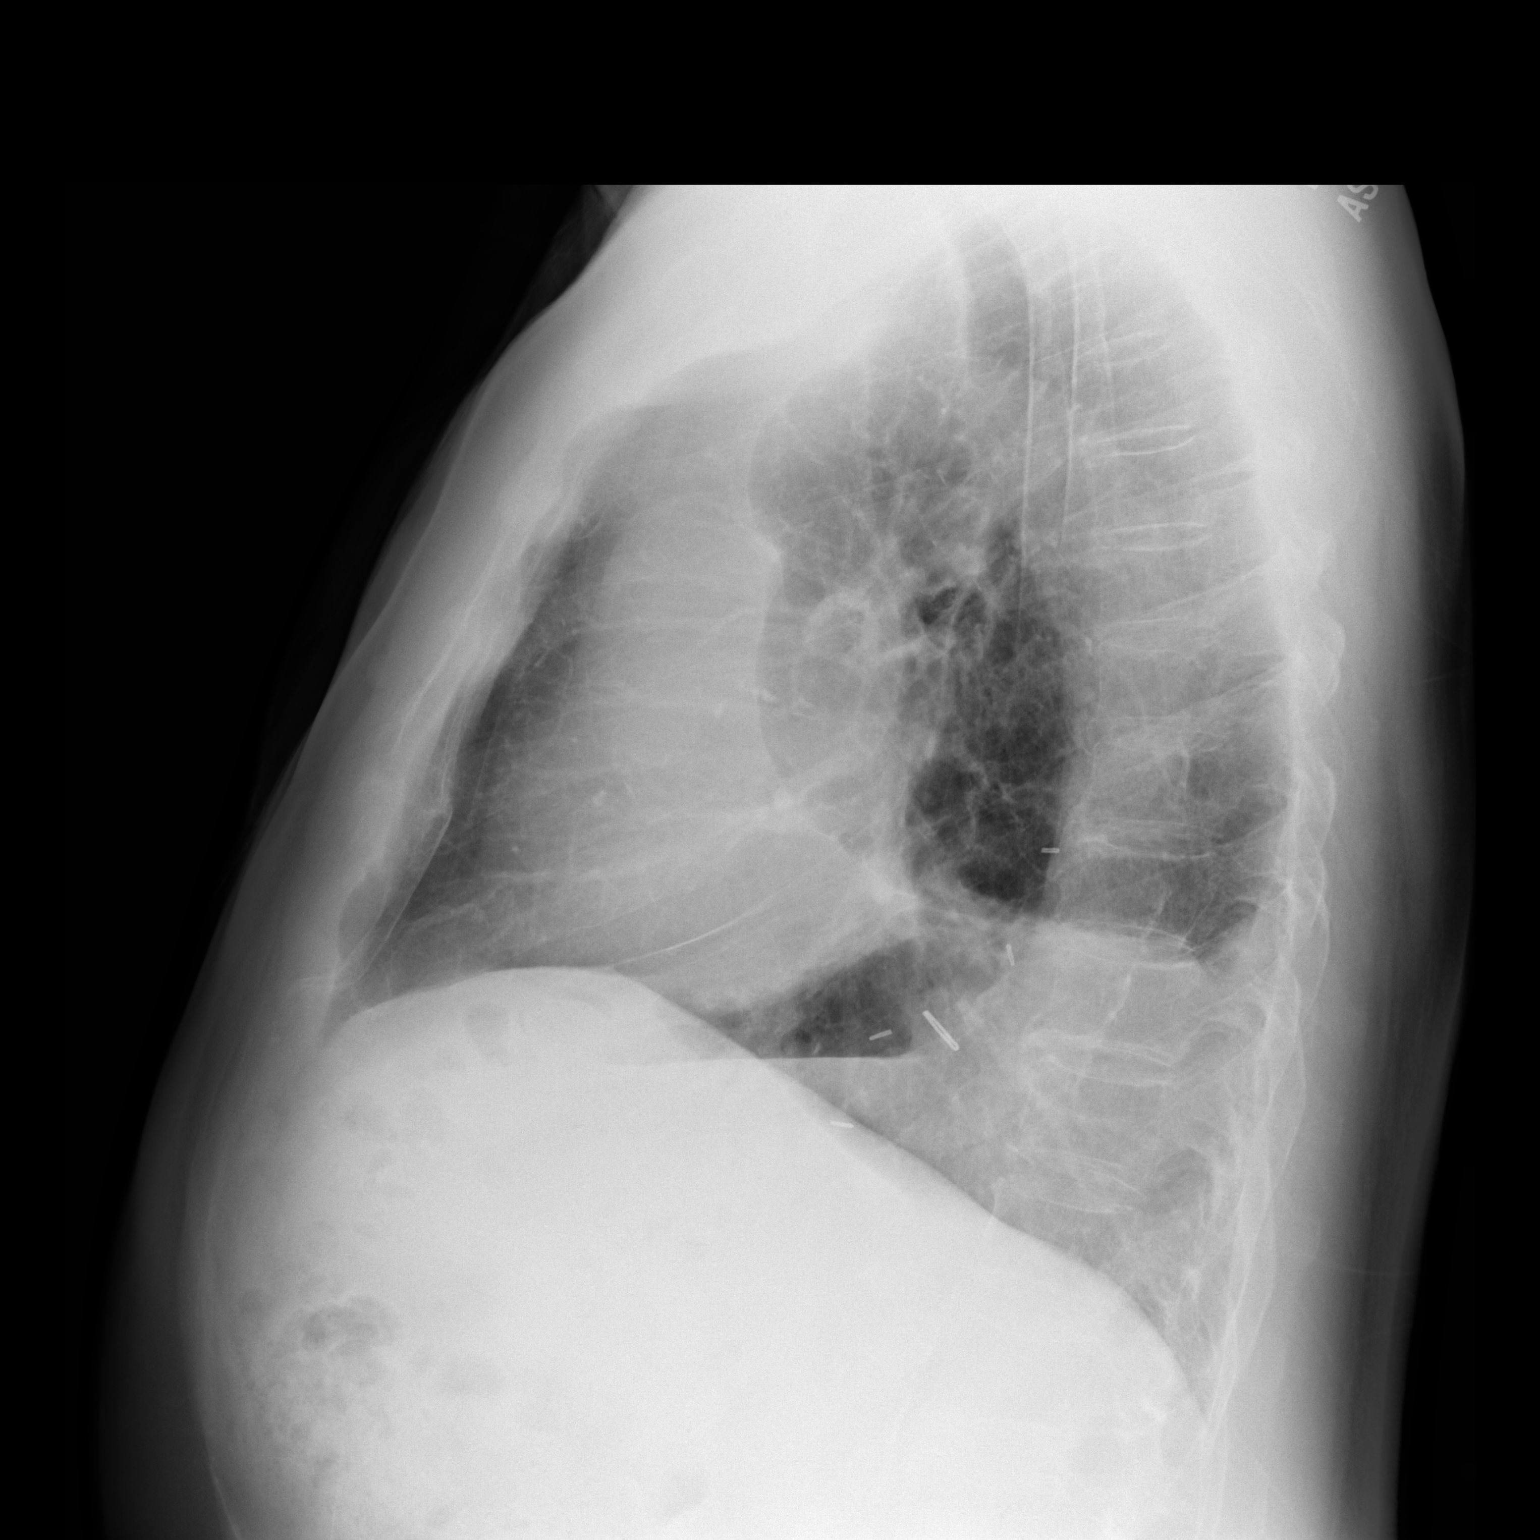

[2 of 2 positions shown; findings below may reference images not displayed]

FINDINGS: The heart size and mediastinal contours are within normal limits.
Right lung is clear. No pneumothorax is noted. Interval development
of mild left pleural effusion with probable associated atelectasis
or infiltrate. The visualized skeletal structures are unremarkable.
IMPRESSION: Interval development of mild left pleural effusion with probable
associated atelectasis or infiltrate.

Aortic Atherosclerosis ([id number]).

## 2019-08-25 DIAGNOSIS — M542 Cervicalgia: Secondary | ICD-10-CM | POA: Diagnosis not present

## 2019-12-31 DIAGNOSIS — R7309 Other abnormal glucose: Secondary | ICD-10-CM | POA: Diagnosis not present

## 2019-12-31 DIAGNOSIS — Z961 Presence of intraocular lens: Secondary | ICD-10-CM | POA: Diagnosis not present

## 2019-12-31 DIAGNOSIS — H40013 Open angle with borderline findings, low risk, bilateral: Secondary | ICD-10-CM | POA: Diagnosis not present

## 2020-01-26 DIAGNOSIS — K219 Gastro-esophageal reflux disease without esophagitis: Secondary | ICD-10-CM | POA: Diagnosis not present

## 2020-01-26 DIAGNOSIS — Z85118 Personal history of other malignant neoplasm of bronchus and lung: Secondary | ICD-10-CM | POA: Diagnosis not present

## 2020-01-26 DIAGNOSIS — Z1389 Encounter for screening for other disorder: Secondary | ICD-10-CM | POA: Diagnosis not present

## 2020-01-26 DIAGNOSIS — E7800 Pure hypercholesterolemia, unspecified: Secondary | ICD-10-CM | POA: Diagnosis not present

## 2020-01-26 DIAGNOSIS — I700 Atherosclerosis of aorta: Secondary | ICD-10-CM | POA: Diagnosis not present

## 2020-01-26 DIAGNOSIS — E1169 Type 2 diabetes mellitus with other specified complication: Secondary | ICD-10-CM | POA: Diagnosis not present

## 2020-01-26 DIAGNOSIS — J310 Chronic rhinitis: Secondary | ICD-10-CM | POA: Diagnosis not present

## 2020-01-26 DIAGNOSIS — I10 Essential (primary) hypertension: Secondary | ICD-10-CM | POA: Diagnosis not present

## 2020-01-26 DIAGNOSIS — Z0000 Encounter for general adult medical examination without abnormal findings: Secondary | ICD-10-CM | POA: Diagnosis not present

## 2020-01-26 DIAGNOSIS — D692 Other nonthrombocytopenic purpura: Secondary | ICD-10-CM | POA: Diagnosis not present

## 2020-02-23 DIAGNOSIS — R350 Frequency of micturition: Secondary | ICD-10-CM | POA: Diagnosis not present

## 2020-07-16 DIAGNOSIS — C3412 Malignant neoplasm of upper lobe, left bronchus or lung: Secondary | ICD-10-CM | POA: Diagnosis not present

## 2020-07-16 DIAGNOSIS — Z85118 Personal history of other malignant neoplasm of bronchus and lung: Secondary | ICD-10-CM | POA: Diagnosis not present

## 2020-07-16 DIAGNOSIS — Z08 Encounter for follow-up examination after completed treatment for malignant neoplasm: Secondary | ICD-10-CM | POA: Diagnosis not present

## 2020-07-16 DIAGNOSIS — S2232XD Fracture of one rib, left side, subsequent encounter for fracture with routine healing: Secondary | ICD-10-CM | POA: Diagnosis not present

## 2020-07-16 DIAGNOSIS — Z902 Acquired absence of lung [part of]: Secondary | ICD-10-CM | POA: Diagnosis not present

## 2020-07-29 DIAGNOSIS — J310 Chronic rhinitis: Secondary | ICD-10-CM | POA: Diagnosis not present

## 2020-07-29 DIAGNOSIS — K219 Gastro-esophageal reflux disease without esophagitis: Secondary | ICD-10-CM | POA: Diagnosis not present

## 2020-07-29 DIAGNOSIS — I700 Atherosclerosis of aorta: Secondary | ICD-10-CM | POA: Diagnosis not present

## 2020-07-29 DIAGNOSIS — E1169 Type 2 diabetes mellitus with other specified complication: Secondary | ICD-10-CM | POA: Diagnosis not present

## 2020-07-29 DIAGNOSIS — D692 Other nonthrombocytopenic purpura: Secondary | ICD-10-CM | POA: Diagnosis not present

## 2020-07-29 DIAGNOSIS — E7800 Pure hypercholesterolemia, unspecified: Secondary | ICD-10-CM | POA: Diagnosis not present

## 2020-07-29 DIAGNOSIS — Z85118 Personal history of other malignant neoplasm of bronchus and lung: Secondary | ICD-10-CM | POA: Diagnosis not present

## 2020-07-29 DIAGNOSIS — I10 Essential (primary) hypertension: Secondary | ICD-10-CM | POA: Diagnosis not present

## 2020-10-21 DIAGNOSIS — N390 Urinary tract infection, site not specified: Secondary | ICD-10-CM | POA: Diagnosis not present

## 2020-10-21 DIAGNOSIS — R109 Unspecified abdominal pain: Secondary | ICD-10-CM | POA: Diagnosis not present

## 2020-10-24 ENCOUNTER — Emergency Department (HOSPITAL_COMMUNITY): Payer: PPO | Admitting: Anesthesiology

## 2020-10-24 ENCOUNTER — Emergency Department (HOSPITAL_BASED_OUTPATIENT_CLINIC_OR_DEPARTMENT_OTHER): Payer: PPO

## 2020-10-24 ENCOUNTER — Emergency Department (HOSPITAL_BASED_OUTPATIENT_CLINIC_OR_DEPARTMENT_OTHER): Payer: PPO | Admitting: Radiology

## 2020-10-24 ENCOUNTER — Encounter (HOSPITAL_BASED_OUTPATIENT_CLINIC_OR_DEPARTMENT_OTHER): Payer: Self-pay | Admitting: *Deleted

## 2020-10-24 ENCOUNTER — Other Ambulatory Visit: Payer: Self-pay

## 2020-10-24 ENCOUNTER — Encounter (HOSPITAL_COMMUNITY)
Admission: EM | Disposition: A | Discharge status: Home / Self Care | Payer: Self-pay | Source: Home / Self Care | Attending: Internal Medicine

## 2020-10-24 ENCOUNTER — Inpatient Hospital Stay (HOSPITAL_BASED_OUTPATIENT_CLINIC_OR_DEPARTMENT_OTHER)
Admission: EM | Admit: 2020-10-24 | Discharge: 2020-10-27 | DRG: 854 | Disposition: A | Discharge status: Home / Self Care | Payer: PPO | Attending: Internal Medicine | Admitting: Internal Medicine

## 2020-10-24 ENCOUNTER — Inpatient Hospital Stay (HOSPITAL_COMMUNITY): Payer: PPO

## 2020-10-24 DIAGNOSIS — A419 Sepsis, unspecified organism: Secondary | ICD-10-CM | POA: Diagnosis not present

## 2020-10-24 DIAGNOSIS — Z7189 Other specified counseling: Secondary | ICD-10-CM | POA: Diagnosis not present

## 2020-10-24 DIAGNOSIS — Z85118 Personal history of other malignant neoplasm of bronchus and lung: Secondary | ICD-10-CM

## 2020-10-24 DIAGNOSIS — Z96642 Presence of left artificial hip joint: Secondary | ICD-10-CM | POA: Diagnosis present

## 2020-10-24 DIAGNOSIS — Z20822 Contact with and (suspected) exposure to covid-19: Secondary | ICD-10-CM | POA: Diagnosis present

## 2020-10-24 DIAGNOSIS — Z23 Encounter for immunization: Secondary | ICD-10-CM | POA: Diagnosis not present

## 2020-10-24 DIAGNOSIS — Z902 Acquired absence of lung [part of]: Secondary | ICD-10-CM

## 2020-10-24 DIAGNOSIS — K219 Gastro-esophageal reflux disease without esophagitis: Secondary | ICD-10-CM | POA: Diagnosis not present

## 2020-10-24 DIAGNOSIS — Z7982 Long term (current) use of aspirin: Secondary | ICD-10-CM | POA: Diagnosis not present

## 2020-10-24 DIAGNOSIS — Z96653 Presence of artificial knee joint, bilateral: Secondary | ICD-10-CM | POA: Diagnosis present

## 2020-10-24 DIAGNOSIS — N179 Acute kidney failure, unspecified: Secondary | ICD-10-CM | POA: Diagnosis not present

## 2020-10-24 DIAGNOSIS — Z8249 Family history of ischemic heart disease and other diseases of the circulatory system: Secondary | ICD-10-CM | POA: Diagnosis not present

## 2020-10-24 DIAGNOSIS — N136 Pyonephrosis: Secondary | ICD-10-CM | POA: Diagnosis not present

## 2020-10-24 DIAGNOSIS — N390 Urinary tract infection, site not specified: Secondary | ICD-10-CM

## 2020-10-24 DIAGNOSIS — R0602 Shortness of breath: Secondary | ICD-10-CM

## 2020-10-24 DIAGNOSIS — K5900 Constipation, unspecified: Secondary | ICD-10-CM | POA: Diagnosis not present

## 2020-10-24 DIAGNOSIS — E119 Type 2 diabetes mellitus without complications: Secondary | ICD-10-CM | POA: Diagnosis present

## 2020-10-24 DIAGNOSIS — N132 Hydronephrosis with renal and ureteral calculous obstruction: Secondary | ICD-10-CM | POA: Diagnosis not present

## 2020-10-24 DIAGNOSIS — R140 Abdominal distension (gaseous): Secondary | ICD-10-CM

## 2020-10-24 DIAGNOSIS — D509 Iron deficiency anemia, unspecified: Secondary | ICD-10-CM | POA: Diagnosis not present

## 2020-10-24 DIAGNOSIS — Z515 Encounter for palliative care: Secondary | ICD-10-CM | POA: Diagnosis not present

## 2020-10-24 DIAGNOSIS — D6959 Other secondary thrombocytopenia: Secondary | ICD-10-CM | POA: Diagnosis not present

## 2020-10-24 DIAGNOSIS — E872 Acidosis: Secondary | ICD-10-CM | POA: Diagnosis not present

## 2020-10-24 DIAGNOSIS — Z79899 Other long term (current) drug therapy: Secondary | ICD-10-CM

## 2020-10-24 DIAGNOSIS — N1330 Unspecified hydronephrosis: Secondary | ICD-10-CM | POA: Diagnosis not present

## 2020-10-24 DIAGNOSIS — Z8546 Personal history of malignant neoplasm of prostate: Secondary | ICD-10-CM

## 2020-10-24 DIAGNOSIS — Z923 Personal history of irradiation: Secondary | ICD-10-CM | POA: Diagnosis not present

## 2020-10-24 DIAGNOSIS — Z87442 Personal history of urinary calculi: Secondary | ICD-10-CM

## 2020-10-24 DIAGNOSIS — M1990 Unspecified osteoarthritis, unspecified site: Secondary | ICD-10-CM | POA: Diagnosis not present

## 2020-10-24 DIAGNOSIS — R531 Weakness: Secondary | ICD-10-CM | POA: Diagnosis not present

## 2020-10-24 DIAGNOSIS — R000 Tachycardia, unspecified: Secondary | ICD-10-CM | POA: Diagnosis not present

## 2020-10-24 DIAGNOSIS — I10 Essential (primary) hypertension: Secondary | ICD-10-CM | POA: Diagnosis present

## 2020-10-24 DIAGNOSIS — R509 Fever, unspecified: Secondary | ICD-10-CM | POA: Diagnosis not present

## 2020-10-24 DIAGNOSIS — N200 Calculus of kidney: Secondary | ICD-10-CM | POA: Diagnosis not present

## 2020-10-24 HISTORY — PX: CYSTOSCOPY WITH RETROGRADE PYELOGRAM, URETEROSCOPY AND STENT PLACEMENT: SHX5789

## 2020-10-24 LAB — COMPREHENSIVE METABOLIC PANEL
ALT: 15 U/L (ref 0–44)
AST: 21 U/L (ref 15–41)
Albumin: 3.4 g/dL — ABNORMAL LOW (ref 3.5–5.0)
Alkaline Phosphatase: 77 U/L (ref 38–126)
Anion gap: 12 (ref 5–15)
BUN: 66 mg/dL — ABNORMAL HIGH (ref 8–23)
CO2: 22 mmol/L (ref 22–32)
Calcium: 9.5 mg/dL (ref 8.9–10.3)
Chloride: 102 mmol/L (ref 98–111)
Creatinine, Ser: 2.37 mg/dL — ABNORMAL HIGH (ref 0.61–1.24)
GFR, Estimated: 26 mL/min — ABNORMAL LOW (ref >60)
Glucose, Bld: 173 mg/dL — ABNORMAL HIGH (ref 70–99)
Potassium: 4.1 mmol/L (ref 3.5–5.1)
Sodium: 136 mmol/L (ref 135–145)
Total Bilirubin: 0.9 mg/dL (ref 0.3–1.2)
Total Protein: 6.5 g/dL (ref 6.5–8.1)

## 2020-10-24 LAB — CBC WITH DIFFERENTIAL/PLATELET
Abs Immature Granulocytes: 0.05 10*3/uL (ref 0.00–0.07)
Basophils Absolute: 0.1 10*3/uL (ref 0.0–0.1)
Basophils Relative: 0 %
Eosinophils Absolute: 0 10*3/uL (ref 0.0–0.5)
Eosinophils Relative: 0 %
HCT: 42.7 % (ref 39.0–52.0)
Hemoglobin: 13.9 g/dL (ref 13.0–17.0)
Immature Granulocytes: 0 %
Lymphocytes Relative: 2 %
Lymphs Abs: 0.3 10*3/uL — ABNORMAL LOW (ref 0.7–4.0)
MCH: 25.6 pg — ABNORMAL LOW (ref 26.0–34.0)
MCHC: 32.6 g/dL (ref 30.0–36.0)
MCV: 78.8 fL — ABNORMAL LOW (ref 80.0–100.0)
Monocytes Absolute: 0.9 10*3/uL (ref 0.1–1.0)
Monocytes Relative: 5 %
Neutro Abs: 14.4 10*3/uL — ABNORMAL HIGH (ref 1.7–7.7)
Neutrophils Relative %: 93 %
Platelets: 78 10*3/uL — ABNORMAL LOW (ref 150–400)
RBC: 5.42 MIL/uL (ref 4.22–5.81)
RDW: 17.3 % — ABNORMAL HIGH (ref 11.5–15.5)
WBC: 15.7 10*3/uL — ABNORMAL HIGH (ref 4.0–10.5)
nRBC: 0 % (ref 0.0–0.2)

## 2020-10-24 LAB — RESP PANEL BY RT-PCR (FLU A&B, COVID) ARPGX2
Influenza A by PCR: NEGATIVE
Influenza B by PCR: NEGATIVE
SARS Coronavirus 2 by RT PCR: NEGATIVE

## 2020-10-24 LAB — URINALYSIS, ROUTINE W REFLEX MICROSCOPIC
Bilirubin Urine: NEGATIVE
Glucose, UA: NEGATIVE mg/dL
Ketones, ur: 15 mg/dL — AB
Nitrite: NEGATIVE
Protein, ur: 30 mg/dL — AB
RBC / HPF: >50 RBC/hpf — ABNORMAL HIGH (ref 0–5)
Specific Gravity, Urine: 1.014 (ref 1.005–1.030)
WBC, UA: >50 WBC/hpf — ABNORMAL HIGH (ref 0–5)
pH: 5.5 (ref 5.0–8.0)

## 2020-10-24 LAB — LACTIC ACID, PLASMA
Lactic Acid, Venous: 2.2 mmol/L (ref 0.5–1.9)
Lactic Acid, Venous: 1.7 mmol/L (ref 0.5–1.9)

## 2020-10-24 LAB — PROTIME-INR
INR: 1.1 (ref 0.8–1.2)
Prothrombin Time: 13.8 seconds (ref 11.4–15.2)

## 2020-10-24 SURGERY — CYSTOURETEROSCOPY, WITH RETROGRADE PYELOGRAM AND STENT INSERTION
Anesthesia: General | Site: Ureter | Laterality: Right

## 2020-10-24 MED ORDER — ONDANSETRON HCL 4 MG/2ML IJ SOLN
4.0000 mg | Freq: Four times a day (QID) | INTRAMUSCULAR | Status: DC | PRN
  Administered 2020-10-25 – 2020-10-26 (×3): 4 mg via INTRAVENOUS
  Filled 2020-10-24 (×3): qty 2

## 2020-10-24 MED ORDER — LORATADINE 10 MG PO TABS: 10.0000 mg | ORAL_TABLET | Freq: Every day | ORAL | Status: DC | PRN

## 2020-10-24 MED ORDER — ONDANSETRON HCL 4 MG PO TABS: 4.0000 mg | ORAL_TABLET | Freq: Four times a day (QID) | ORAL | Status: DC | PRN

## 2020-10-24 MED ORDER — ACETAMINOPHEN 500 MG PO TABS
1000.0000 mg | ORAL_TABLET | Freq: Once | ORAL | Status: AC
  Administered 2020-10-24: 1000 mg via ORAL
  Filled 2020-10-24: qty 2

## 2020-10-24 MED ORDER — ONDANSETRON HCL 4 MG/2ML IJ SOLN
INTRAMUSCULAR | Status: AC
  Filled 2020-10-24: qty 2

## 2020-10-24 MED ORDER — SODIUM CHLORIDE 0.9 % IV BOLUS
1000.0000 mL | Freq: Once | INTRAVENOUS | Status: AC
  Administered 2020-10-24: 1000 mL via INTRAVENOUS

## 2020-10-24 MED ORDER — LIDOCAINE HCL (CARDIAC) PF 100 MG/5ML IV SOSY
PREFILLED_SYRINGE | INTRAVENOUS | Status: DC | PRN
  Administered 2020-10-24: 50 mg via INTRAVENOUS

## 2020-10-24 MED ORDER — LACTATED RINGERS IV SOLN: INTRAVENOUS | Status: DC | PRN

## 2020-10-24 MED ORDER — FENTANYL CITRATE PF 50 MCG/ML IJ SOSY: 25.0000 ug | PREFILLED_SYRINGE | INTRAMUSCULAR | Status: DC | PRN

## 2020-10-24 MED ORDER — OXYCODONE HCL 5 MG PO TABS: 5.0000 mg | ORAL_TABLET | Freq: Once | ORAL | Status: DC | PRN

## 2020-10-24 MED ORDER — ENOXAPARIN SODIUM 30 MG/0.3ML IJ SOSY
30.0000 mg | PREFILLED_SYRINGE | INTRAMUSCULAR | Status: DC
  Administered 2020-10-25 – 2020-10-26 (×2): 30 mg via SUBCUTANEOUS
  Filled 2020-10-24 (×3): qty 0.3

## 2020-10-24 MED ORDER — ACETAMINOPHEN 500 MG PO TABS: 500.0000 mg | ORAL_TABLET | Freq: Three times a day (TID) | ORAL | Status: DC | PRN

## 2020-10-24 MED ORDER — KETOROLAC TROMETHAMINE 15 MG/ML IJ SOLN
15.0000 mg | Freq: Once | INTRAMUSCULAR | Status: AC
  Administered 2020-10-24: 15 mg via INTRAVENOUS
  Filled 2020-10-24: qty 1

## 2020-10-24 MED ORDER — DEXAMETHASONE SODIUM PHOSPHATE 10 MG/ML IJ SOLN
INTRAMUSCULAR | Status: AC
  Filled 2020-10-24: qty 1

## 2020-10-24 MED ORDER — INFLUENZA VAC A&B SA ADJ QUAD 0.5 ML IM PRSY
0.5000 mL | PREFILLED_SYRINGE | INTRAMUSCULAR | Status: DC
  Filled 2020-10-24 (×2): qty 0.5

## 2020-10-24 MED ORDER — IOHEXOL 300 MG/ML  SOLN
INTRAMUSCULAR | Status: DC | PRN
  Administered 2020-10-24: 7 mL

## 2020-10-24 MED ORDER — OXYCODONE HCL 5 MG/5ML PO SOLN: 5.0000 mg | Freq: Once | ORAL | Status: DC | PRN

## 2020-10-24 MED ORDER — SODIUM CHLORIDE 0.9 % IV SOLN
INTRAVENOUS | Status: DC | PRN
Start: 2020-10-24 — End: 2020-10-24

## 2020-10-24 MED ORDER — ONDANSETRON HCL 4 MG/2ML IJ SOLN
INTRAMUSCULAR | Status: DC | PRN
  Administered 2020-10-24: 4 mg via INTRAVENOUS

## 2020-10-24 MED ORDER — PANTOPRAZOLE SODIUM 40 MG PO TBEC
40.0000 mg | DELAYED_RELEASE_TABLET | Freq: Every day | ORAL | Status: DC
  Administered 2020-10-25 – 2020-10-27 (×2): 40 mg via ORAL
  Filled 2020-10-24 (×2): qty 1

## 2020-10-24 MED ORDER — PROPOFOL 10 MG/ML IV BOLUS
INTRAVENOUS | Status: AC
  Filled 2020-10-24: qty 20

## 2020-10-24 MED ORDER — FENTANYL CITRATE (PF) 100 MCG/2ML IJ SOLN
INTRAMUSCULAR | Status: DC | PRN
  Administered 2020-10-24: 50 ug via INTRAVENOUS

## 2020-10-24 MED ORDER — FENTANYL CITRATE (PF) 100 MCG/2ML IJ SOLN
INTRAMUSCULAR | Status: AC
  Filled 2020-10-24: qty 2

## 2020-10-24 MED ORDER — SODIUM CHLORIDE 0.9 % IV SOLN
2.0000 g | Freq: Once | INTRAVENOUS | Status: AC
  Administered 2020-10-24: 2 g via INTRAVENOUS
  Filled 2020-10-24: qty 20

## 2020-10-24 MED ORDER — ACETAMINOPHEN 325 MG PO TABS: 650.0000 mg | ORAL_TABLET | Freq: Four times a day (QID) | ORAL | Status: DC | PRN

## 2020-10-24 MED ORDER — OXYCODONE HCL 5 MG PO TABS
5.0000 mg | ORAL_TABLET | ORAL | Status: DC | PRN
  Administered 2020-10-25 – 2020-10-26 (×2): 5 mg via ORAL
  Filled 2020-10-24 (×2): qty 1

## 2020-10-24 MED ORDER — SODIUM CHLORIDE 0.9 % IV SOLN: INTRAVENOUS | Status: DC | PRN

## 2020-10-24 MED ORDER — IPRATROPIUM BROMIDE 0.06 % NA SOLN
1.0000 | Freq: Three times a day (TID) | NASAL | Status: DC | PRN
  Filled 2020-10-24: qty 15

## 2020-10-24 MED ORDER — ONDANSETRON HCL 4 MG/2ML IJ SOLN: 4.0000 mg | Freq: Four times a day (QID) | INTRAMUSCULAR | Status: DC | PRN

## 2020-10-24 MED ORDER — SENNOSIDES-DOCUSATE SODIUM 8.6-50 MG PO TABS
2.0000 | ORAL_TABLET | Freq: Two times a day (BID) | ORAL | Status: DC
  Administered 2020-10-24 – 2020-10-27 (×6): 2 via ORAL
  Filled 2020-10-24 (×6): qty 2

## 2020-10-24 MED ORDER — STERILE WATER FOR IRRIGATION IR SOLN
Status: DC | PRN
  Administered 2020-10-24: 3000 mL

## 2020-10-24 MED ORDER — PROPOFOL 10 MG/ML IV BOLUS
INTRAVENOUS | Status: DC | PRN
  Administered 2020-10-24: 50 mg via INTRAVENOUS
  Administered 2020-10-24: 100 mg via INTRAVENOUS

## 2020-10-24 MED ORDER — LACTATED RINGERS IV SOLN: INTRAVENOUS | Status: DC

## 2020-10-24 MED ORDER — DEXAMETHASONE SODIUM PHOSPHATE 10 MG/ML IJ SOLN
INTRAMUSCULAR | Status: DC | PRN
Start: 2020-10-24 — End: 2020-10-24
  Administered 2020-10-24: 8 mg via INTRAVENOUS

## 2020-10-24 MED ORDER — ACETAMINOPHEN 650 MG RE SUPP: 650.0000 mg | Freq: Four times a day (QID) | RECTAL | Status: DC | PRN

## 2020-10-24 MED ORDER — SODIUM CHLORIDE 0.9 % IV SOLN
2.0000 g | INTRAVENOUS | Status: DC
Start: 2020-10-25 — End: 2020-10-27
  Administered 2020-10-25 – 2020-10-26 (×2): 2 g via INTRAVENOUS
  Filled 2020-10-24 (×3): qty 20

## 2020-10-24 SURGICAL SUPPLY — 17 items
BAG URO CATCHER STRL LF (MISCELLANEOUS) ×2 IMPLANT
BASKET ZERO TIP NITINOL 2.4FR (BASKET) IMPLANT
CATH URET 5FR 28IN OPEN ENDED (CATHETERS) ×2 IMPLANT
CLOTH BEACON ORANGE TIMEOUT ST (SAFETY) ×2 IMPLANT
EXTRACTOR STONE 1.7FRX115CM (UROLOGICAL SUPPLIES) IMPLANT
GLOVE SURG ENC TEXT LTX SZ7.5 (GLOVE) ×2 IMPLANT
GOWN STRL REUS W/TWL XL LVL3 (GOWN DISPOSABLE) ×2 IMPLANT
GUIDEWIRE ANG ZIPWIRE 038X150 (WIRE) IMPLANT
GUIDEWIRE STR DUAL SENSOR (WIRE) ×2 IMPLANT
KIT TURNOVER KIT A (KITS) ×2 IMPLANT
MANIFOLD NEPTUNE II (INSTRUMENTS) ×2 IMPLANT
PACK CYSTO (CUSTOM PROCEDURE TRAY) ×2 IMPLANT
SHEATH URETERAL 12FRX28CM (UROLOGICAL SUPPLIES) IMPLANT
SHEATH URETERAL 12FRX35CM (MISCELLANEOUS) IMPLANT
STENT URET 6FRX28 CONTOUR (STENTS) ×2 IMPLANT
TUBING CONNECTING 10 (TUBING) ×2 IMPLANT
TUBING UROLOGY SET (TUBING) ×2 IMPLANT

## 2020-10-24 NOTE — Op Note (Signed)
Preoperative diagnosis:  Right obstructing ureteral stones Urinary tract infection  Postoperative diagnosis:  Same  Procedure: Cystoscopy, right retrograde pyelogram with interpretation Right ureteral stent placement  Surgeon: Ardis Hughs, MD  Anesthesia: General  Complications: None  Intraoperative findings: The retrograde pyelogram demonstrated a normal distal ureter with a filling defect just distal to the pelvic inlet.  There were 2 filling defects back-to-back, the stones were slightly dislodged and pushed more proximal.  There was significant hydronephrosis.  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  EBL: Minimal  Specimens: None  Drains: 28 cm time 6 French double-J stent placed in the right ureter  Indication: Joseph [name] is a 87 y.o. patient with septic right ureteral stone.  After reviewing the management options for treatment, he elected to proceed with the above surgical procedure(s). We have discussed the potential benefits and risks of the procedure, side effects of the proposed treatment, the likelihood of the patient achieving the goals of the procedure, and any potential problems that might occur during the procedure or recuperation. Informed consent has been obtained.  Description of procedure:  The patient was taken to the operating room and general anesthesia was induced.  The patient was placed in the dorsal lithotomy position, prepped and draped in the usual sterile fashion, and preoperative antibiotics were administered. A preoperative time-out was performed.   21 French 2 degree cystoscope was gently passed to the patient's urethra and into the bladder under visual guidance.  The bladder was noted to be heavily trabeculated.  There are some cellules.  There were also some erythematous areas consistent with cystitis.  The patient's prostate appeared to have been previously resected.  The ureteral orifice ease were orthotopic.  I cannulated the right  ureteral orifice with a 5 [place] open-ended catheter and performed retrograde pyelogram the above findings.  I advanced a sensor wire through the open-ended catheter and into the right renal pelvis.  I noted at this point it was a bifid collecting system and I was able to get the wire in the upper pole.  I then advanced a 28 cm time 6 French double-J stent over the wire and into the renal pelvis under fluoroscopic guidance.  Once it was noted to be within the right upper pole I advanced it to the bladder neck prior to removing the wire.  The stent was noted to be in good position on fluoroscopy.  The stent was draining turbid urine.  The bladder was subsequently emptied and the cystoscope removed.  The patient was subsequently extubated return the PACU in stable condition.  Disposition: The patient will be scheduled for follow-up ureteroscopy in the coming weeks.  He is being admitted to the hospital service today for further evaluation ration and management.  Ardis Hughs, M.D.

## 2020-10-24 NOTE — Anesthesia Procedure Notes (Signed)
Procedure Name: LMA Insertion Date/Time: 10/24/2020 3:40 PM Performed by: Lissa Morales, CRNA Pre-anesthesia Checklist: Patient identified, Emergency Drugs available, Suction available and Patient being monitored Patient Re-evaluated:Patient Re-evaluated prior to induction Oxygen Delivery Method: Circle system utilized Preoxygenation: Pre-oxygenation with 100% oxygen Induction Type: IV induction Ventilation: Mask ventilation without difficulty LMA: LMA with gastric port inserted LMA Size: 5.0 Tube type: Oral Number of attempts: 1 Placement Confirmation: positive ETCO2 Tube secured with: Tape Dental Injury: Teeth and Oropharynx as per pre-operative assessment

## 2020-10-24 NOTE — Progress Notes (Signed)
The patient is status post right ureteral stent placement for 2 obstructing stones in the right mid ureter.  The procedure was straightforward and well-tolerated.  He was extubated and returned to the PACU in good condition.  The patient is being admitted to the hospital service.  He will be scheduled for follow-up ureteroscopy in approximately 2 weeks.  He should be placed on appropriate antibiotics for the next 10 days.  I will see the patient as needed.

## 2020-10-24 NOTE — ED Provider Notes (Signed)
[facility] EMERGENCY DEPT Provider Note   CSN: [id number] Arrival date & time: 10/24/20  0935     History Chief Complaint  Patient presents with   Nausea   Emesis   Fever   UTI    Joseph [name] is a 87 y.o. male with history of prostate cancer, lung cancer s/p resection, not on active chemoradiation therapy, presenting to the ED with concern for fever and weakness.  His daughter provides supplemental history.  The patient typically lives by himself, is extremely independent, mows his own lawn and takes care of himself.  His daughter reports he began to feel unwell on Thursday, reporting fatigue and fevers and poor urine output.  They went to see the PCP at that time and had a UA done in the office and were told this could be a urine infection, he was started on Levaquin 750 mg x 5 days.  He has taken 4 days worth of Levaquin so far, but continues to feel very unwell.  His daughter is concerned that he appears fatigued.  She reports has been given Tylenol and Advil the past several days as needed for fevers.  He is not running a fever today.  He has not been eating too much today.  He has not been having diarrhea.  No vomiting.  The patient is only complaining of pain across his lower back.  He also feels fatigue.  He denies headache, cough, sore throat.  He did have a negative home COVID test on Thursday when this started.    HPI     Past Medical History:  Diagnosis Date   Arthritis    Cancer ([place])    HX PROSTATE CANCER - MANY YRS AGO - ? RADIATION [place]. Lung cancer   Diabetes mellitus without complication ([place])    DIET CONTROLLED   GERD (gastroesophageal reflux disease)    Hearing loss    History of hiatal hernia    History of kidney stones    History of nonunion of fracture    LEFT PROXIMAL FEMUR   Hypertension    Pneumonia    Seasonal allergies     Patient Active Problem List   Diagnosis Date Noted   Sepsis ([place]) 10/24/2020   Vertigo    Essential  hypertension    Iron deficiency anemia    Fracture, proximal femur ([place]) 03/19/2014   Painful orthopaedic hardware ([place]) 01/13/2013   Fall at home 01/18/2012   Hip fracture, left ([place]) 01/18/2012   Femur fracture, left ([place]) 01/18/2012   ESOPHAGEAL MOTILITY DISORDER 11/16/2009   PERSONAL HX PROSTATE CANCER 11/16/2009   DIVERTICULITIS-COLON 08/30/2009   CONSTIPATION 08/30/2009   HYPERTENSION 07/28/2009   GERD 07/28/2009   DIVERTICULOSIS-COLON 07/28/2009   ABDOMINAL PAIN, LEFT UPPER QUADRANT 07/28/2009   ABDOMINAL PAIN, LEFT LOWER QUADRANT 07/28/2009   PERSONAL HX COLONIC POLYPS 07/28/2009   ACHALASIA 03/09/2009   RECTAL BLEEDING 03/09/2009   NONSPECIFIC ABN FINDING RAD & OTH EXAM GI TRACT 02/25/2009   DYSPHAGIA UNSPECIFIED 02/24/2009    Past Surgical History:  Procedure Laterality Date   CONVERSION TO TOTAL HIP Left 03/19/2014   Procedure: CONVERSION OF PREVIOUS HIP SURGERY TO LEFT TOTAL HIP ARTHROPLASTY;  Surgeon: Gearlean Alf, MD;  Location: [facility];  Service: Orthopedics;  Laterality: Left;   FEMUR IM NAIL  01/18/2012   Procedure: INTRAMEDULLARY (IM) NAIL FEMORAL;  Surgeon: Gearlean Alf, MD;  Location: [facility];  Service: Orthopedics;  Laterality: Left;   HARDWARE REMOVAL Left 01/13/2013  Procedure: HARDWARE REVISION LEFT HIP AND EXCHANGE OF COMPRESSION SCREW;  Surgeon: Gearlean Alf, MD;  Location: [facility];  Service: Orthopedics;  Laterality: Left;   HELLER MYOTOMY     HIATAL HERNIA REPAIR  2011   JOINT REPLACEMENT  12/2011   BIL KNEE REPLACEMENTS   LOBECTOMY Left    left upper lobe   THORACOSCOPY     VIDEO BRONCHOSCOPY WITH ENDOBRONCHIAL NAVIGATION N/A 03/21/2017   Procedure: VIDEO BRONCHOSCOPY WITH ENDOBRONCHIAL NAVIGATION;  Surgeon: Melrose Nakayama, MD;  Location: [facility];  Service: Thoracic;  Laterality: N/A;       Family History  Problem Relation Age of Onset   Heart attack Mother    Hypertension Mother    Heart attack Father    Hypertension Father     Colon cancer Neg Hx    Esophageal cancer Neg Hx    Rectal cancer Neg Hx    Stomach cancer Neg Hx     Social History   Tobacco Use   Smoking status: Never   Smokeless tobacco: Never  Vaping Use   Vaping Use: Never used  Substance Use Topics   Alcohol use: No   Drug use: No    Home Medications Prior to Admission medications   Medication Sig Start Date End Date Taking? Authorizing Provider  acetaminophen (TYLENOL) 500 MG tablet Take 500-1,000 mg by mouth every 8 (eight) hours as needed for moderate pain or headache.    [provider]  amoxicillin (AMOXIL) 500 MG capsule Take 2,000 mg by mouth See admin instructions. Take 4 capsules (2000 mg) by mouth 1 hours prior to procedure.    [provider]  aspirin EC 81 MG tablet Take 81 mg by mouth daily.    [provider]  HYDROcodone-homatropine (HYCODAN) 5-1.5 MG/5ML syrup Take 5 mLs by mouth every 6 (six) hours as needed for cough. 02/28/17   [provider]  ibuprofen (ADVIL,MOTRIN) 200 MG tablet Take 400-600 mg by mouth every 8 (eight) hours as needed (for pain.).    [provider]  ipratropium (ATROVENT) 0.06 % nasal spray Place 1 spray into both nostrils See admin instructions. Before each meal up to 3 times daily    [provider]  lisinopril (PRINIVIL,ZESTRIL) 20 MG tablet Take 20 mg by mouth daily.     [provider]  loratadine (CLARITIN) 10 MG tablet Take 10 mg by mouth daily.    [provider]  meclizine (ANTIVERT) 25 MG tablet Take 1 tablet (25 mg total) by mouth 2 (two) times daily as needed for dizziness. Patient taking differently: Take 12.5-25 mg by mouth 3 (three) times daily as needed for dizziness.  11/16/14   Ripley Fraise, MD  metoprolol succinate (TOPROL-XL) 100 MG 24 hr tablet Take 100 mg by mouth daily with breakfast.     [provider]  ondansetron (ZOFRAN) 4 MG tablet Take 4 mg by mouth every 8 (eight) hours as needed for nausea  or vomiting.    [provider]  oxycodone (OXY-IR) 5 MG capsule Take 5 mg by mouth every 4 (four) hours as needed.    [provider]  pantoprazole (PROTONIX) 40 MG tablet Take 40 mg by mouth daily before breakfast.     [provider]  Sennosides-Docusate Sodium (SENNA PLUS PO) Take by mouth.    [provider]    Allergies    Patient has no known allergies.  Review of Systems   Review of Systems  Constitutional:  Positive for appetite change, chills, fatigue and fever.  HENT:  Negative for ear pain and sore throat.   Eyes:  Negative for pain and visual disturbance.  Respiratory:  Negative for cough and shortness of breath.   Cardiovascular:  Negative for chest pain and palpitations.  Gastrointestinal:  Positive for abdominal pain and nausea. Negative for vomiting.  Genitourinary:  Negative for dysuria and hematuria.  Musculoskeletal:  Positive for back pain. Negative for arthralgias.  Skin:  Negative for color change and rash.  Neurological:  Positive for light-headedness. Negative for syncope and headaches.  All other systems reviewed and are negative.  Physical Exam Updated Vital Signs BP 123/85 (BP Location: Right Arm)   Pulse 85   Temp 97.9 F (36.6 C) (Oral)   Resp 18   Ht 6\' 1"  (1.854 m)   Wt 84.6 kg   SpO2 97%   BMI 24.61 kg/m   Physical Exam Constitutional:      General: He is not in acute distress. HENT:     Head: Normocephalic and atraumatic.  Eyes:     Conjunctiva/sclera: Conjunctivae normal.     Pupils: Pupils are equal, round, and reactive to light.  Cardiovascular:     Rate and Rhythm: Normal rate and regular rhythm.  Pulmonary:     Effort: Pulmonary effort is normal. No respiratory distress.  Abdominal:     General: There is no distension.     Tenderness: There is abdominal tenderness in the epigastric area. There is no guarding or rebound. Negative signs include Murphy's sign.  Musculoskeletal:     Comments:  Mild paraspinal muscle ttp  Skin:    General: Skin is warm and dry.  Neurological:     General: No focal deficit present.     Mental Status: He is alert and oriented to person, place, and time. Mental status is at baseline.  Psychiatric:        Mood and Affect: Mood normal.        Behavior: Behavior normal.    ED Results / Procedures / Treatments   Labs (all labs ordered are listed, but only abnormal results are displayed) Labs Reviewed  COMPREHENSIVE METABOLIC PANEL - Abnormal; Notable for the following components:      Result Value   Glucose, Bld 173 (*)    BUN 66 (*)    Creatinine, Ser 2.37 (*)    Albumin 3.4 (*)    GFR, Estimated 26 (*)    All other components within normal limits  LACTIC ACID, PLASMA - Abnormal; Notable for the following components:   Lactic Acid, Venous 2.2 (*)    All other components within normal limits  CBC WITH DIFFERENTIAL/PLATELET - Abnormal; Notable for the following components:   WBC 15.7 (*)    MCV 78.8 (*)    MCH 25.6 (*)    RDW 17.3 (*)    Platelets 78 (*)    Neutro Abs 14.4 (*)    Lymphs Abs 0.3 (*)    All other components within normal limits  URINALYSIS, ROUTINE W REFLEX MICROSCOPIC - Abnormal; Notable for the following components:   Hgb urine dipstick MODERATE (*)    Ketones, ur 15 (*)    Protein, ur 30 (*)    Leukocytes,Ua LARGE (*)    RBC / HPF >50 (*)    WBC, UA >50 (*)    Non Squamous Epithelial 0-5 (*)    All other components within normal limits  RESP PANEL BY RT-PCR (FLU A&B, COVID) ARPGX2  CULTURE, BLOOD (ROUTINE X 2)  CULTURE, BLOOD (ROUTINE X 2)  LACTIC ACID, PLASMA  PROTIME-INR    EKG EKG Interpretation  Date/Time:  Sunday October 24 2020 10:09:15 EDT Ventricular Rate:  98 PR Interval:  164 QRS Duration: 94 QT Interval:  363 QTC Calculation: 464 R Axis:   -30 Text Interpretation: Sinus tachycardia Atrial premature complexes Abnormal R-wave progression, early transition Left ventricular hypertrophy Inferior  infarct, old Confirmed by Octaviano Glow [id number]) on 10/24/2020 11:36:04 AM  Radiology CT ABDOMEN PELVIS WO CONTRAST  Result Date: 10/24/2020 CLINICAL DATA:  Abdominal pain.  Fever.  Possible pyelonephritis. EXAM: CT ABDOMEN AND PELVIS WITHOUT CONTRAST TECHNIQUE: Multidetector CT imaging of the abdomen and pelvis was performed following the standard protocol without IV contrast. COMPARISON:  12/02/2012. FINDINGS: Lower chest: No acute abnormality. Hepatobiliary: No focal liver abnormality is seen. No gallstones, gallbladder wall thickening, or biliary dilatation. Pancreas: No mass or inflammation. Spleen: Normal in size without focal abnormality. Adrenals/Urinary Tract: No adrenal masses. Two adjacent 5 mm stones in the right ureter, just below the pelvic brim, causing mild ureteral dilation and moderate right hydronephrosis. No left hydronephrosis. 7 mm nonobstructing stone, lower pole the right kidney. No other intrarenal stones. Low-attenuation renal masses, largest on the right, 8.6 cm, upper pole, largest on the left, 8.7 cm, lower pole, consistent with cysts. Additional right midpole renal mass, protruding exophytically from the cortex, hyperattenuating, average Hounsfield units of 62, measuring 2 cm in size, consistent with a hemorrhagic cyst. Normal left ureter.  Bladder is unremarkable. Stomach/Bowel: Small hiatal hernia. Stomach otherwise unremarkable. Small bowel and colon are normal in caliber. No wall thickening. No inflammation. Pan colonic diverticulosis. Vascular/Lymphatic: Aortic atherosclerosis. No enlarged lymph nodes. Reproductive: Prostate normal in size. Other: No ascites. Musculoskeletal: Total left hip prosthesis, incompletely imaged, but appearing well seated and aligned. No fracture or acute finding. No bone lesion. IMPRESSION: 1. Two adjacent 5 mm stones in the mid to distal right ureter, just below the pelvic brim, causing mild dilation of the ureter above the stones and moderate  right hydronephrosis. 2. No other acute abnormality within the abdomen or pelvis. 3. 7 mm nonobstructing stone in the lower pole the right kidney. 4. Bilateral renal masses consistent with cysts. A 2 cm right renal masses hyperattenuating consistent with a hemorrhagic cyst. 5. Aortic atherosclerosis.  Pan colonic diverticulosis. Electronically Signed   By: Lajean Manes M.D.   On: 10/24/2020 12:50   DG Chest Port 1 View  Result Date: 10/24/2020 CLINICAL DATA:  Thursday morning fever, N/V decreased urinary output. Went to PCP Thursday dx with UTI started on Levaquin. Pt has not been drinking and eating well, noted dizziness, fever, N/V. EXAM: PORTABLE CHEST 1 VIEW COMPARISON:  08/07/2018. FINDINGS: Cardiac silhouette normal in size.  No mediastinal or hilar masses. There is opacity at the left lung base obscuring the hemidiaphragm, similar to the prior study. Remainder of the lungs is clear. No evidence of a right pleural effusion. No pneumothorax. Skeletal structures are grossly intact. IMPRESSION: 1. No acute cardiopulmonary disease. 2. Chronic left lung base opacity consistent with changes from prior lung surgery. Electronically Signed   By: Lajean Manes M.D.   On: 10/24/2020 10:50   DG C-Arm 1-60 Min-No Report  Result Date: 10/24/2020 Fluoroscopy was utilized by the requesting physician.  No radiographic interpretation.    Procedures .Critical Care Performed by: Wyvonnia Dusky, MD Authorized by: Wyvonnia Dusky, MD   Critical care provider statement:    Critical care  time (minutes):  45   Critical care was necessary to treat or prevent imminent or life-threatening deterioration of the following conditions:  Sepsis   Critical care was time spent personally by me on the following activities:  Discussions with consultants, evaluation of patient's response to treatment, examination of patient, ordering and performing treatments and interventions, ordering and review of laboratory studies,  ordering and review of radiographic studies, pulse oximetry, re-evaluation of patient's condition, obtaining history from patient or surrogate and review of old charts   Medications Ordered in ED Medications  influenza vaccine adjuvanted (FLUAD) injection 0.5 mL (has no administration in time range)  sodium chloride 0.9 % bolus 1,000 mL (0 mLs Intravenous Stopped 10/24/20 1354)  ketorolac (TORADOL) 15 MG/ML injection 15 mg (15 mg Intravenous Given 10/24/20 1206)  acetaminophen (TYLENOL) tablet 1,000 mg (1,000 mg Oral Given 10/24/20 1206)  cefTRIAXone (ROCEPHIN) 2 g in sodium chloride 0.9 % 100 mL IVPB (0 g Intravenous Stopped 10/24/20 1354)    ED Course  I have reviewed the triage vital signs and the nursing notes.  Pertinent labs & imaging results that were available during my care of the patient were reviewed by me and considered in my medical decision making (see chart for details).  Differential diagnosis includes infection most likely versus other cause.  Chest x-ray ordered and reviewed showing no focal infiltrates.  He has no respiratory symptoms such as pneumonia.  COVID test is also ordered.  UA is also pending.  Lactate and white blood cell count are pending.  Will give IV fluids, IV Toradol, Tylenol for hydration and fevers and chills.  Supplemental history provided by the patient's daughter at bedside.  I personally reviewed his EKG which shows no acute ischemic findings.  I doubt this is ACS.  Labs personally reviewed -WBC elevated, lactate 2.2 improved after IV fluids to 1.7.  Cr with AKI at 2.37, BUN elevated as well.  IV rocephin ordered for urosepsis.  Blood cx sent.  Clinical Course as of 10/24/20 1702  Sun Oct 24, 2020  1142 WBC(!): 15.7 [MT]  1204 Lactic Acid, Venous(!!): 2.2 [MT]  1205 BUN(!): 66 [MT]  1205 Creatinine(!): 2.37 [MT]  1205 AKI likely 2/2 poor hydraiton and UTI [MT]  1257 Paged urology [MT]  1303 I spoke to Dr Louis Meckel who recommends transfer to  [facility], possible stenting today - paged [facility] hospitalist now.  Family and patient updated [MT]  1412 Patient accepted at [facility].  [facility] in route to pick him up. [MT]    Clinical Course User Index [MT] Wyvonnia Dusky, MD   Final Clinical Impression(s) / ED Diagnoses Final diagnoses:  Urinary tract infection without hematuria, site unspecified  Sepsis, due to unspecified organism, unspecified whether acute organ dysfunction present [facility])    Rx / DC Orders ED Discharge Orders     None        Wyvonnia Dusky, MD 10/24/20 1702

## 2020-10-24 NOTE — Anesthesia Preprocedure Evaluation (Signed)
Anesthesia Evaluation  Patient identified by MRN, date of birth, ID band Patient awake    Reviewed: Allergy & Precautions, H&P , NPO status , Patient's Chart, lab work & pertinent test results  Airway Mallampati: II   Neck ROM: full    Dental   Pulmonary neg pulmonary ROS,    breath sounds clear to auscultation       Cardiovascular hypertension,  Rhythm:regular Rate:Normal     Neuro/Psych    GI/Hepatic GERD  ,  Endo/Other  diabetes  Renal/GU ARFRenal diseasestones     Musculoskeletal  (+) Arthritis ,   Abdominal   Peds  Hematology   Anesthesia Other Findings   Reproductive/Obstetrics                             Anesthesia Physical Anesthesia Plan  ASA: 3  Anesthesia Plan: General   Post-op Pain Management:    Induction: Intravenous  PONV Risk Score and Plan: 2 and Ondansetron and Dexamethasone  Airway Management Planned: LMA  Additional Equipment:   Intra-op Plan:   Post-operative Plan: Extubation in OR  Informed Consent: I have reviewed the patients History and Physical, chart, labs and discussed the procedure including the risks, benefits and alternatives for the proposed anesthesia with the patient or authorized representative who has indicated his/her understanding and acceptance.     Dental advisory given  Plan Discussed with: Anesthesiologist, CRNA and Surgeon  Anesthesia Plan Comments:         Anesthesia Quick Evaluation

## 2020-10-24 NOTE — ED Triage Notes (Signed)
Thursday morning fever, N/V decreased urinary output. Went to PCP Thursday dx with UTI started on Levaquin. Pt has not been drinking and eating well, noted dizziness, fever, N/V.

## 2020-10-24 NOTE — H&P (Signed)
History and Physical    Joseph [name] XLK:[id number] DOB: 1931-03-16 DOA: 10/24/2020  PCP: Gaynelle Arabian, MD  Patient coming from: Home.   I have personally briefly reviewed patient's old medical records in [place]  Chief Complaint: fever and chills, associated with nausea, vomiting, left flank pain and epigastric pain since Thursday.   HPI: Joseph [name] is a 87 y.o. male with medical history significant of hypertension, diet-controlled diabetes mellitus, GERD, history of hiatal hernia, non-small cell lung cancer on the left s/p thoracotomy and lobectomy by Dr. Roxan Hockey in 2019, started having fevers and chills associated with nausea vomiting and abdominal pain, left-sided flank pain since Thursday.  He was seen in PCP office was found to have a UTI and was given prescription for Levaquin for 5 days.  Patient's daughter reports despite 3 days of Levaquin he continued to have flank pain.  They presented to ED for further evaluation. Patient denies any hematuria dizziness, syncope.  He denies any chest pain shortness of breath cough.  He denies any diarrhea hematochezia, headaches.  ED Course: On arrival to ED patient had low-grade temperature with T-max of 99.5, tachycardic 102/min, tachypneic with respiratory rate of 28/min and blood pressure of 98/70 mmhg, .  Labs were significant for for a creatinine of 2.37, lactic acid of 2.2, WBC count 15,700, platelet count of 78,000, .  Respiratory panel is negative.  Urinalysis shows large leukocytes with WBC clumps.  CT of the abdomen and pelvis without contrast shows Two adjacent 5 mm stones in the mid to distal right ureter, just below the pelvic brim, causing mild dilation of the ureter above the stones and moderate right hydronephrosis. 2. No other acute abnormality within the abdomen or pelvis. 3. 7 mm nonobstructing stone in the lower pole the right kidney. 4. Bilateral renal masses consistent with cysts. A 2 cm right renal  masses hyperattenuating consistent with a hemorrhagic cyst.   Urology was consulted and patient was taken to PACU directly and he underwent cystoscopy with right retrograde pyelogram with interpretation and right ureteral stent placement.  Was found to have 2 filling defects back-to-back and they were slightly dislodged and pushed more proximal.  He also underwent 6 French double-J stent in the right ureter.   He was referred to [facility] for admission for sepsis secondary to obstructive uropathy and UTI, and AKI  Review of Systems: As per HPI otherwise "All others reviewed and are negative,.  Past Medical History:  Diagnosis Date   Arthritis    Cancer ([place])    HX PROSTATE CANCER - MANY YRS AGO - ? RADIATION [place]. Lung cancer   Diabetes mellitus without complication ([place])    DIET CONTROLLED   GERD (gastroesophageal reflux disease)    Hearing loss    History of hiatal hernia    History of kidney stones    History of nonunion of fracture    LEFT PROXIMAL FEMUR   Hypertension    Pneumonia    Seasonal allergies     Past Surgical History:  Procedure Laterality Date   CONVERSION TO TOTAL HIP Left 03/19/2014   Procedure: CONVERSION OF PREVIOUS HIP SURGERY TO LEFT TOTAL HIP ARTHROPLASTY;  Surgeon: Gearlean Alf, MD;  Location: [facility];  Service: Orthopedics;  Laterality: Left;   FEMUR IM NAIL  01/18/2012   Procedure: INTRAMEDULLARY (IM) NAIL FEMORAL;  Surgeon: Gearlean Alf, MD;  Location: [place];  Service: Orthopedics;  Laterality: Left;   HARDWARE REMOVAL Left 01/13/2013  Procedure: HARDWARE REVISION LEFT HIP AND EXCHANGE OF COMPRESSION SCREW;  Surgeon: Gearlean Alf, MD;  Location: [facility];  Service: Orthopedics;  Laterality: Left;   HELLER MYOTOMY     HIATAL HERNIA REPAIR  2011   JOINT REPLACEMENT  12/2011   BIL KNEE REPLACEMENTS   LOBECTOMY Left    left upper lobe   THORACOSCOPY     VIDEO BRONCHOSCOPY WITH ENDOBRONCHIAL NAVIGATION N/A 03/21/2017   Procedure: VIDEO BRONCHOSCOPY WITH  ENDOBRONCHIAL NAVIGATION;  Surgeon: Melrose Nakayama, MD;  Location: [facility];  Service: Thoracic;  Laterality: N/A;    Social History  reports that he has never smoked. He has never used smokeless tobacco. He reports that he does not drink alcohol and does not use drugs.  No Known Allergies  Family History  Problem Relation Age of Onset   Heart attack Mother    Hypertension Mother    Heart attack Father    Hypertension Father    Colon cancer Neg Hx    Esophageal cancer Neg Hx    Rectal cancer Neg Hx    Stomach cancer Neg Hx   No family history of  renal carcinoma.   Prior to Admission medications   Medication Sig Start Date End Date Taking? Authorizing Provider  acetaminophen (TYLENOL) 500 MG tablet Take 500-1,000 mg by mouth every 8 (eight) hours as needed for moderate pain or headache.    [provider]  amoxicillin (AMOXIL) 500 MG capsule Take 2,000 mg by mouth See admin instructions. Take 4 capsules (2000 mg) by mouth 1 hours prior to procedure.    [provider]  aspirin EC 81 MG tablet Take 81 mg by mouth daily.    [provider]  HYDROcodone-homatropine (HYCODAN) 5-1.5 MG/5ML syrup Take 5 mLs by mouth every 6 (six) hours as needed for cough. 02/28/17   [provider]  ibuprofen (ADVIL,MOTRIN) 200 MG tablet Take 400-600 mg by mouth every 8 (eight) hours as needed (for pain.).    [provider]  ipratropium (ATROVENT) 0.06 % nasal spray Place 1 spray into both nostrils See admin instructions. Before each meal up to 3 times daily    [provider]  lisinopril (PRINIVIL,ZESTRIL) 20 MG tablet Take 20 mg by mouth daily.     [provider]  loratadine (CLARITIN) 10 MG tablet Take 10 mg by mouth daily.    [provider]  meclizine (ANTIVERT) 25 MG tablet Take 1 tablet (25 mg total) by mouth 2 (two) times daily as needed for dizziness. Patient taking differently: Take 12.5-25 mg by mouth 3 (three) times  daily as needed for dizziness.  11/16/14   Ripley Fraise, MD  metoprolol succinate (TOPROL-XL) 100 MG 24 hr tablet Take 100 mg by mouth daily with breakfast.     [provider]  ondansetron (ZOFRAN) 4 MG tablet Take 4 mg by mouth every 8 (eight) hours as needed for nausea or vomiting.    [provider]  oxycodone (OXY-IR) 5 MG capsule Take 5 mg by mouth every 4 (four) hours as needed.    [provider]  pantoprazole (PROTONIX) 40 MG tablet Take 40 mg by mouth daily before breakfast.     [provider]  Sennosides-Docusate Sodium (SENNA PLUS PO) Take by mouth.    [provider]    Physical Exam: Vitals:   10/24/20 1630 10/24/20 1636 10/24/20 1649 10/24/20 1650  BP: 138/76  123/85   Pulse: 88 86 85   Resp: 20  17 18   Temp:  97.8 F (36.6 C) 97.9 F (36.6 C)   TempSrc:   Oral   SpO2: 94% 94% 97%   Weight:    84.6 kg  Height:        Constitutional: NAD, calm, comfortable Vitals:   10/24/20 1630 10/24/20 1636 10/24/20 1649 10/24/20 1650  BP: 138/76  123/85   Pulse: 88 86 85   Resp: 20 17 18    Temp:  97.8 F (36.6 C) 97.9 F (36.6 C)   TempSrc:   Oral   SpO2: 94% 94% 97%   Weight:    84.6 kg  Height:       Eyes: PERRL, lids and conjunctivae normal ENMT: Mucous membranes are moist. .Normal dentition.  Neck: normal, supple, no masses, no thyromegaly Respiratory: clear to auscultation bilaterally, no wheezing, Normal respiratory effort. No accessory muscle use.  Cardiovascular: Regular rate and rhythm, no murmurs / rubs / gallops. No extremity edema. 2+ pedal pulses. No carotid bruits.  Abdomen: no tenderness, no masses palpated. . Bowel sounds positive.  Musculoskeletal: no clubbing / cyanosis. No joint deformity upper and lower extremities.  Normal muscle tone.  Skin: no rashes, lesions, ulcers. No induration Neurologic: CN 2-12 grossly intact. Sensation intact,  Psychiatric: Normal judgment and insight. Alert and oriented x  3. Normal mood.     Labs on Admission: I have personally reviewed following labs and imaging studies  CBC: Recent Labs  Lab 10/24/20 1010  WBC 15.7*  NEUTROABS 14.4*  HGB 13.9  HCT 42.7  MCV 78.8*  PLT 78*    Basic Metabolic Panel: Recent Labs  Lab 10/24/20 1010  NA 136  K 4.1  CL 102  CO2 22  GLUCOSE 173*  BUN 66*  CREATININE 2.37*  CALCIUM 9.5    GFR: Estimated Creatinine Clearance: 24.8 mL/min (A) (by C-G formula based on SCr of 2.37 mg/dL (H)).  Liver Function Tests: Recent Labs  Lab 10/24/20 1010  AST 21  ALT 15  ALKPHOS 77  BILITOT 0.9  PROT 6.5  ALBUMIN 3.4*    Urine analysis:    Component Value Date/Time   COLORURINE YELLOW 10/24/2020 1010   APPEARANCEUR CLEAR 10/24/2020 1010   LABSPEC 1.014 10/24/2020 1010   PHURINE 5.5 10/24/2020 1010   GLUCOSEU NEGATIVE 10/24/2020 1010   HGBUR MODERATE (A) 10/24/2020 1010   BILIRUBINUR NEGATIVE 10/24/2020 1010   KETONESUR 15 (A) 10/24/2020 1010   PROTEINUR 30 (A) 10/24/2020 1010   UROBILINOGEN 1.0 11/16/2014 0326   NITRITE NEGATIVE 10/24/2020 1010   LEUKOCYTESUR LARGE (A) 10/24/2020 1010    Radiological Exams on Admission: CT ABDOMEN PELVIS WO CONTRAST  Result Date: 10/24/2020 CLINICAL DATA:  Abdominal pain.  Fever.  Possible pyelonephritis. EXAM: CT ABDOMEN AND PELVIS WITHOUT CONTRAST TECHNIQUE: Multidetector CT imaging of the abdomen and pelvis was performed following the standard protocol without IV contrast. COMPARISON:  12/02/2012. FINDINGS: Lower chest: No acute abnormality. Hepatobiliary: No focal liver abnormality is seen. No gallstones, gallbladder wall thickening, or biliary dilatation. Pancreas: No mass or inflammation. Spleen: Normal in size without focal abnormality. Adrenals/Urinary Tract: No adrenal masses. Two adjacent 5 mm stones in the right ureter, just below the pelvic brim, causing mild ureteral dilation and moderate right hydronephrosis. No left hydronephrosis. 7 mm nonobstructing  stone, lower pole the right kidney. No other intrarenal stones. Low-attenuation renal masses, largest on the right, 8.6 cm, upper pole, largest on the left, 8.7 cm, lower pole, consistent with cysts. Additional right midpole renal mass, protruding  exophytically from the cortex, hyperattenuating, average Hounsfield units of 62, measuring 2 cm in size, consistent with a hemorrhagic cyst. Normal left ureter.  Bladder is unremarkable. Stomach/Bowel: Small hiatal hernia. Stomach otherwise unremarkable. Small bowel and colon are normal in caliber. No wall thickening. No inflammation. Pan colonic diverticulosis. Vascular/Lymphatic: Aortic atherosclerosis. No enlarged lymph nodes. Reproductive: Prostate normal in size. Other: No ascites. Musculoskeletal: Total left hip prosthesis, incompletely imaged, but appearing well seated and aligned. No fracture or acute finding. No bone lesion. IMPRESSION: 1. Two adjacent 5 mm stones in the mid to distal right ureter, just below the pelvic brim, causing mild dilation of the ureter above the stones and moderate right hydronephrosis. 2. No other acute abnormality within the abdomen or pelvis. 3. 7 mm nonobstructing stone in the lower pole the right kidney. 4. Bilateral renal masses consistent with cysts. A 2 cm right renal masses hyperattenuating consistent with a hemorrhagic cyst. 5. Aortic atherosclerosis.  Pan colonic diverticulosis. Electronically Signed   By: Lajean Manes M.D.   On: 10/24/2020 12:50   DG Chest Port 1 View  Result Date: 10/24/2020 CLINICAL DATA:  Thursday morning fever, N/V decreased urinary output. Went to PCP Thursday dx with UTI started on Levaquin. Pt has not been drinking and eating well, noted dizziness, fever, N/V. EXAM: PORTABLE CHEST 1 VIEW COMPARISON:  08/07/2018. FINDINGS: Cardiac silhouette normal in size.  No mediastinal or hilar masses. There is opacity at the left lung base obscuring the hemidiaphragm, similar to the prior study. Remainder of  the lungs is clear. No evidence of a right pleural effusion. No pneumothorax. Skeletal structures are grossly intact. IMPRESSION: 1. No acute cardiopulmonary disease. 2. Chronic left lung base opacity consistent with changes from prior lung surgery. Electronically Signed   By: Lajean Manes M.D.   On: 10/24/2020 10:50   DG C-Arm 1-60 Min-No Report  Result Date: 10/24/2020 Fluoroscopy was utilized by the requesting physician.  No radiographic interpretation.    EKG: Sinus tachycardia with PACs Assessment/Plan Active Problems:   GERD   Essential hypertension   Sepsis ([place])  Sepsis with obstructive uropathy on the right secondary to urinary tract infection Sepsis physiology is improving. Continue with IV fluids, IV Rocephin, keep MAP greater than 65 mmhg. Lactic acid normalized. Blood cultures sent and pending urine cultures ordered.  Patient underwent cystoscopy and double-J stent placed on the right ureter. Follow pro calcitonin.    Right sided hydronephrosis with AKI secondary to obstructing stones S/p cystoscopy and double-J stent placement on the right Appreciate urology input Continue with IV fluids and follow renal parameters.  Patient's baseline creatinine around 1 and he was admitted with a creatinine of 2.37.   Diet controlled diabetes mellitus     Hypertension Blood pressure parameters are borderline optimal  GERD History of hiatal hernia Continue with Protonix.     Acute thrombocytopenia Unclear etiology, probably secondary to sepsis and infection. Continue to monitor.    Leukocytosis Probably secondary to UTI   Non-small cell lung cancer left upper lobe S/p thoracotomy, lobectomy by Dr Roxan Hockey in 2019. Outpatient follow up.   DVT prophylaxis: Lovenox  Code Status:   Full code.  Family Communication:  Daughter at bedside.  Disposition Plan:   Patient is from:  Home.   Anticipated DC to:  Home.   Anticipated DC date:  9/27  Anticipated DC  barriers: 9/27  Consults called:  Urology Dr Louis Meckel.  Admission status:  Inpatient, telemetry.   Severity of Illness: The appropriate patient  status for this patient is INPATIENT. Inpatient status is judged to be reasonable and necessary in order to provide the required intensity of service to ensure the patient's safety. The patient's presenting symptoms, physical exam findings, and initial radiographic and laboratory data in the context of their chronic comorbidities is felt to place them at high risk for further clinical deterioration. Furthermore, it is not anticipated that the patient will be medically stable for discharge from the hospital within 2 midnights of admission.   * I certify that at the point of admission it is my clinical judgment that the patient will require inpatient hospital care spanning beyond 2 midnights from the point of admission due to high intensity of service, high risk for further deterioration and high frequency of surveillance required.*    Hosie Poisson MD [facility]  How to contact the [facility] Attending or Consulting provider [place] or covering provider during after hours [place], for this patient?   Check the care team in [facility] and look for a) attending/consulting [facility] provider listed and b) the [facility] team listed Log into [email or web address] and use [place]'s universal password to access. If you do not have the password, please contact the hospital operator. Locate the [facility] provider you are looking for under [facility] and page to a number that you can be directly reached. If you still have difficulty reaching the provider, please page the [facility] (Director on Call) for the Hospitalists listed on amion for assistance.  10/24/2020, 6:12 PM

## 2020-10-24 NOTE — Consult Note (Signed)
I have been asked to see the patient by Dr. Octaviano Glow, for evaluation and management of right obstructing distal ureteral stone with fever and concern for infection.  History of present illness: 87 year old male who presents to the outside emergency department with 4 days of flank pain and malaise.  He was having associated nausea and lack of appetite.  Last night he had a fever of 101.  he presented to the emergency department this morning for further evaluation.  At that time he was noted to be tachycardic but afebrile.  His vital signs were stable.  A urine analysis was performed demonstrating numerous leukocytes and red blood cells.  He had a white blood cell count of 15.7 and a lactate of 2.2.  He also was noted to have diminished renal function.  CT scan was performed and demonstrated 2 5 mm stone in the right mid to distal ureter.  His pain was reasonably well controlled.  Review of systems: A 12 point comprehensive review of systems was obtained and is negative unless otherwise stated in the history of present illness.  Patient Active Problem List   Diagnosis Date Noted   Sepsis ([place]) 10/24/2020   Vertigo    Essential hypertension    Iron deficiency anemia    Fracture, proximal femur ([place]) 03/19/2014   Painful orthopaedic hardware ([place]) 01/13/2013   Fall at home 01/18/2012   Hip fracture, left ([place]) 01/18/2012   Femur fracture, left ([facility]) 01/18/2012   ESOPHAGEAL MOTILITY DISORDER 11/16/2009   PERSONAL HX PROSTATE CANCER 11/16/2009   DIVERTICULITIS-COLON 08/30/2009   CONSTIPATION 08/30/2009   HYPERTENSION 07/28/2009   GERD 07/28/2009   DIVERTICULOSIS-COLON 07/28/2009   ABDOMINAL PAIN, LEFT UPPER QUADRANT 07/28/2009   ABDOMINAL PAIN, LEFT LOWER QUADRANT 07/28/2009   PERSONAL HX COLONIC POLYPS 07/28/2009   ACHALASIA 03/09/2009   RECTAL BLEEDING 03/09/2009   NONSPECIFIC ABN FINDING RAD & OTH EXAM GI TRACT 02/25/2009   DYSPHAGIA UNSPECIFIED 02/24/2009    No current  facility-administered medications on file prior to encounter.   Current Outpatient Medications on File Prior to Encounter  Medication Sig Dispense Refill   acetaminophen (TYLENOL) 500 MG tablet Take 500-1,000 mg by mouth every 8 (eight) hours as needed for moderate pain or headache.     amoxicillin (AMOXIL) 500 MG capsule Take 2,000 mg by mouth See admin instructions. Take 4 capsules (2000 mg) by mouth 1 hours prior to procedure.     aspirin EC 81 MG tablet Take 81 mg by mouth daily.     HYDROcodone-homatropine (HYCODAN) 5-1.5 MG/5ML syrup Take 5 mLs by mouth every 6 (six) hours as needed for cough.  0   ibuprofen (ADVIL,MOTRIN) 200 MG tablet Take 400-600 mg by mouth every 8 (eight) hours as needed (for pain.).     ipratropium (ATROVENT) 0.06 % nasal spray Place 1 spray into both nostrils See admin instructions. Before each meal up to 3 times daily     lisinopril (PRINIVIL,ZESTRIL) 20 MG tablet Take 20 mg by mouth daily.      loratadine (CLARITIN) 10 MG tablet Take 10 mg by mouth daily.     meclizine (ANTIVERT) 25 MG tablet Take 1 tablet (25 mg total) by mouth 2 (two) times daily as needed for dizziness. (Patient taking differently: Take 12.5-25 mg by mouth 3 (three) times daily as needed for dizziness. ) 15 tablet 0   metoprolol succinate (TOPROL-XL) 100 MG 24 hr tablet Take 100 mg by mouth daily with breakfast.      ondansetron (ZOFRAN)  4 MG tablet Take 4 mg by mouth every 8 (eight) hours as needed for nausea or vomiting.     oxycodone (OXY-IR) 5 MG capsule Take 5 mg by mouth every 4 (four) hours as needed.     pantoprazole (PROTONIX) 40 MG tablet Take 40 mg by mouth daily before breakfast.      Sennosides-Docusate Sodium (SENNA PLUS PO) Take by mouth.      Past Medical History:  Diagnosis Date   Arthritis    Cancer ([place])    HX PROSTATE CANCER - MANY YRS AGO - ? RADIATION TX. Lung cancer   Diabetes mellitus without complication ([place])    DIET CONTROLLED   GERD (gastroesophageal reflux  disease)    Hearing loss    History of hiatal hernia    History of kidney stones    History of nonunion of fracture    LEFT PROXIMAL FEMUR   Hypertension    Pneumonia    Seasonal allergies     Past Surgical History:  Procedure Laterality Date   CONVERSION TO TOTAL HIP Left 03/19/2014   Procedure: CONVERSION OF PREVIOUS HIP SURGERY TO LEFT TOTAL HIP ARTHROPLASTY;  Surgeon: Gearlean Alf, MD;  Location: [facility];  Service: Orthopedics;  Laterality: Left;   FEMUR IM NAIL  01/18/2012   Procedure: INTRAMEDULLARY (IM) NAIL FEMORAL;  Surgeon: Gearlean Alf, MD;  Location: [facility];  Service: Orthopedics;  Laterality: Left;   HARDWARE REMOVAL Left 01/13/2013   Procedure: HARDWARE REVISION LEFT HIP AND EXCHANGE OF COMPRESSION SCREW;  Surgeon: Gearlean Alf, MD;  Location: [facility];  Service: Orthopedics;  Laterality: Left;   [place] HERNIA REPAIR  2011   JOINT REPLACEMENT  12/2011   BIL KNEE REPLACEMENTS   LOBECTOMY Left    left upper lobe   THORACOSCOPY     VIDEO BRONCHOSCOPY WITH ENDOBRONCHIAL NAVIGATION N/A 03/21/2017   Procedure: VIDEO BRONCHOSCOPY WITH ENDOBRONCHIAL NAVIGATION;  Surgeon: Melrose Nakayama, MD;  Location: [facility];  Service: Thoracic;  Laterality: N/A;    Social History   Tobacco Use   Smoking status: Never   Smokeless tobacco: Never  Vaping Use   Vaping Use: Never used  Substance Use Topics   Alcohol use: No   Drug use: No    Family History  Problem Relation Age of Onset   Heart attack Mother    Hypertension Mother    Heart attack Father    Hypertension Father    Colon cancer Neg Hx    Esophageal cancer Neg Hx    Rectal cancer Neg Hx    Stomach cancer Neg Hx     PE: Vitals:   10/24/20 1137 10/24/20 1202 10/24/20 1330 10/24/20 1445  BP: 133/66 126/69 128/89 (!) 129/98  Pulse: 96 91 84   Resp: (!) 27 15 (!) 21 (!) 25  Temp:      TempSrc:      SpO2: 99% 97% 96%   Weight:      Height:       Patient appears to be in no acute  distress  patient is alert and oriented x3 Atraumatic normocephalic head No cervical or supraclavicular lymphadenopathy appreciated No increased work of breathing, no audible wheezes/rhonchi Regular sinus rhythm/rate Abdomen is soft, nontender, nondistended, right CVA tenderness  Lower extremities are symmetric without appreciable edema Grossly neurologically intact No identifiable skin lesions  Recent Labs    10/24/20 1010  WBC 15.7*  HGB 13.9  HCT 42.7  Recent Labs    10/24/20 1010  NA 136  K 4.1  CL 102  CO2 22  GLUCOSE 173*  BUN 66*  CREATININE 2.37*  CALCIUM 9.5   Recent Labs    10/24/20 1010  INR 1.1   No results for input(s): LABURIN in the last 72 hours. Results for orders placed or performed during the hospital encounter of 10/24/20  Resp Panel by RT-PCR (Flu A&B, Covid) Nasopharyngeal Swab     Status: None   Collection Time: 10/24/20 11:57 AM   Specimen: Nasopharyngeal Swab; Nasopharyngeal(NP) swabs in vial transport medium  Result Value Ref Range Status   SARS Coronavirus 2 by RT PCR NEGATIVE NEGATIVE Final    Comment: (NOTE) SARS-CoV-2 target nucleic acids are NOT DETECTED.  The SARS-CoV-2 RNA is generally detectable in upper respiratory specimens during the acute phase of infection. The lowest concentration of SARS-CoV-2 viral copies this assay can detect is 138 copies/mL. A negative result does not preclude SARS-Cov-2 infection and should not be used as the sole basis for treatment or other patient management decisions. A negative result may occur with  improper specimen collection/handling, submission of specimen other than nasopharyngeal swab, presence of viral mutation(s) within the areas targeted by this assay, and inadequate number of viral copies(<138 copies/mL). A negative result must be combined with clinical observations, patient history, and epidemiological information. The expected result is Negative.  Fact Sheet for Patients:   EntrepreneurPulse.com.au  Fact Sheet for Healthcare Providers:  IncredibleEmployment.be  This test is no t yet approved or cleared by the [place] FDA and  has been authorized for detection and/or diagnosis of SARS-CoV-2 by FDA under an Emergency Use Authorization (EUA). This EUA will remain  in effect (meaning this test can be used) for the duration of the COVID-19 declaration under Section 564(b)(1) of the Act, 21 U.S.C.section 360bbb-3(b)(1), unless the authorization is terminated  or revoked sooner.       Influenza A by PCR NEGATIVE NEGATIVE Final   Influenza B by PCR NEGATIVE NEGATIVE Final    Comment: (NOTE) The Xpert Xpress SARS-CoV-2/FLU/RSV plus assay is intended as an aid in the diagnosis of influenza from Nasopharyngeal swab specimens and should not be used as a sole basis for treatment. Nasal washings and aspirates are unacceptable for Xpert Xpress SARS-CoV-2/FLU/RSV testing.  Fact Sheet for Patients: EntrepreneurPulse.com.au  Fact Sheet for Healthcare Providers: IncredibleEmployment.be  This test is not yet approved or cleared by the [place] FDA and has been authorized for detection and/or diagnosis of SARS-CoV-2 by FDA under an Emergency Use Authorization (EUA). This EUA will remain in effect (meaning this test can be used) for the duration of the COVID-19 declaration under Section 564(b)(1) of the Act, 21 U.S.C. section 360bbb-3(b)(1), unless the authorization is terminated or revoked.  Performed at [facility], [address], [place], [place] [address]     Imaging: I have independently reviewed the patient's CAT scan which demonstrates a two 5 mm stones in the mid ureter with associated proximal hydroureteronephrosis.  He also has a larger stone in the right lower pole.  Imp: 87 year old male with a ureteral obstruction secondary to 2 5 mm stones in  the right mid ureter with concern for infection.  Recommendations: Plan to take the patient emergently to the operating room for right ureteral stent placement to treat his pain and potential infection and to avoid bacteremia.  I went through the surgery with the patient and discussed the associated risks and benefits.  He  understands that he will have a stent postoperatively and will need his stone treated as a separate procedure in the near future.  He agreed to proceed.  Consent was signed.  Ardis Hughs

## 2020-10-24 NOTE — Plan of Care (Signed)

## 2020-10-24 NOTE — Transfer of Care (Signed)
Immediate Anesthesia Transfer of Care Note  Patient: Joseph [name]  Procedure(s) Performed: CYSTOSCOPY WITH RETROGRADE PYELOGRAM, URETEROSCOPY AND STENT PLACEMENT (Right: Ureter)  Patient Location: PACU  Anesthesia Type:General  Level of Consciousness: awake, alert , oriented and patient cooperative  Airway & Oxygen Therapy: Patient Spontanous Breathing and Patient connected to face mask oxygen  Post-op Assessment: Report given to RN, Post -op Vital signs reviewed and stable and Patient moving all extremities X 4  Post vital signs: stable  Last Vitals:  Vitals Value Taken Time  BP 128/75 10/24/20 1615  Temp 37.5 C 10/24/20 1604  Pulse 87 10/24/20 1617  Resp 18 10/24/20 1617  SpO2 98 % 10/24/20 1617  Vitals shown include unvalidated device data.  Last Pain:  Vitals:   10/24/20 1604  TempSrc:   PainSc: Asleep         Complications: No notable events documented.

## 2020-10-24 NOTE — ED Notes (Signed)
Pt. Remains in no distress. I have just given Mia, RN report at [facility] in their O.R. Plan is for pt. To go directly to PACU there.

## 2020-10-25 ENCOUNTER — Encounter (HOSPITAL_COMMUNITY): Payer: Self-pay | Admitting: Urology

## 2020-10-25 DIAGNOSIS — K219 Gastro-esophageal reflux disease without esophagitis: Secondary | ICD-10-CM | POA: Diagnosis not present

## 2020-10-25 DIAGNOSIS — I10 Essential (primary) hypertension: Secondary | ICD-10-CM | POA: Diagnosis not present

## 2020-10-25 DIAGNOSIS — A419 Sepsis, unspecified organism: Secondary | ICD-10-CM | POA: Diagnosis not present

## 2020-10-25 LAB — HEMOGLOBIN A1C
Hgb A1c MFr Bld: 7 % — ABNORMAL HIGH (ref 4.8–5.6)
Mean Plasma Glucose: 154.2 mg/dL

## 2020-10-25 LAB — CBC WITH DIFFERENTIAL/PLATELET
Abs Immature Granulocytes: 0.13 10*3/uL — ABNORMAL HIGH (ref 0.00–0.07)
Basophils Absolute: 0 10*3/uL (ref 0.0–0.1)
Basophils Relative: 0 %
Eosinophils Absolute: 0 10*3/uL (ref 0.0–0.5)
Eosinophils Relative: 0 %
HCT: 37.4 % — ABNORMAL LOW (ref 39.0–52.0)
Hemoglobin: 12.1 g/dL — ABNORMAL LOW (ref 13.0–17.0)
Immature Granulocytes: 1 %
Lymphocytes Relative: 2 %
Lymphs Abs: 0.3 10*3/uL — ABNORMAL LOW (ref 0.7–4.0)
MCH: 25.7 pg — ABNORMAL LOW (ref 26.0–34.0)
MCHC: 32.4 g/dL (ref 30.0–36.0)
MCV: 79.4 fL — ABNORMAL LOW (ref 80.0–100.0)
Monocytes Absolute: 0.5 10*3/uL (ref 0.1–1.0)
Monocytes Relative: 5 %
Neutro Abs: 10 10*3/uL — ABNORMAL HIGH (ref 1.7–7.7)
Neutrophils Relative %: 92 %
Platelets: 80 10*3/uL — ABNORMAL LOW (ref 150–400)
RBC: 4.71 MIL/uL (ref 4.22–5.81)
RDW: 16.6 % — ABNORMAL HIGH (ref 11.5–15.5)
WBC: 10.9 10*3/uL — ABNORMAL HIGH (ref 4.0–10.5)
nRBC: 0 % (ref 0.0–0.2)

## 2020-10-25 LAB — BASIC METABOLIC PANEL
Anion gap: 7 (ref 5–15)
BUN: 63 mg/dL — ABNORMAL HIGH (ref 8–23)
CO2: 21 mmol/L — ABNORMAL LOW (ref 22–32)
Calcium: 8.7 mg/dL — ABNORMAL LOW (ref 8.9–10.3)
Chloride: 112 mmol/L — ABNORMAL HIGH (ref 98–111)
Creatinine, Ser: 2.31 mg/dL — ABNORMAL HIGH (ref 0.61–1.24)
GFR, Estimated: 27 mL/min — ABNORMAL LOW (ref >60)
Glucose, Bld: 214 mg/dL — ABNORMAL HIGH (ref 70–99)
Potassium: 4.8 mmol/L (ref 3.5–5.1)
Sodium: 140 mmol/L (ref 135–145)

## 2020-10-25 LAB — PROCALCITONIN: Procalcitonin: 25.16 ng/mL

## 2020-10-25 LAB — GLUCOSE, CAPILLARY
Glucose-Capillary: 86 mg/dL (ref 70–99)
Glucose-Capillary: 187 mg/dL — ABNORMAL HIGH (ref 70–99)
Glucose-Capillary: 195 mg/dL — ABNORMAL HIGH (ref 70–99)
Glucose-Capillary: 148 mg/dL — ABNORMAL HIGH (ref 70–99)

## 2020-10-25 LAB — CORTISOL-AM, BLOOD: Cortisol - AM: 6.7 ug/dL (ref 6.7–22.6)

## 2020-10-25 MED ORDER — COVID-19MRNA BIVAL VACC PFIZER 30 MCG/0.3ML IM SUSP
0.3000 mL | Freq: Once | INTRAMUSCULAR | Status: DC
  Filled 2020-10-25: qty 0.3

## 2020-10-25 MED ORDER — COVID-19 MRNA VAC-TRIS(PFIZER) 30 MCG/0.3ML IM SUSP: 0.3000 mL | Freq: Once | INTRAMUSCULAR | Status: DC

## 2020-10-25 MED ORDER — INSULIN ASPART 100 UNIT/ML IJ SOLN
0.0000 [IU] | Freq: Three times a day (TID) | INTRAMUSCULAR | Status: DC
  Administered 2020-10-25 (×2): 2 [IU] via SUBCUTANEOUS
  Administered 2020-10-26 (×2): 1 [IU] via SUBCUTANEOUS

## 2020-10-25 MED ORDER — COVID-19MRNA BIVAL VACC PFIZER 30 MCG/0.3ML IM SUSP: 0.3000 mL | Freq: Once | INTRAMUSCULAR | Status: DC

## 2020-10-25 NOTE — Plan of Care (Signed)

## 2020-10-25 NOTE — Plan of Care (Signed)
Pt aox4, no episode of n/v thru the night, no c/o pain and able to void using the urinal.  Problem: Health Behavior/Discharge Planning: Goal: Ability to manage health-related needs will improve Outcome: Progressing   Problem: Elimination: Goal: Will not experience complications related to bowel motility Outcome: Progressing Goal: Will not experience complications related to urinary retention Outcome: Progressing   Problem: Nutrition: Goal: Adequate nutrition will be maintained Outcome: Progressing

## 2020-10-25 NOTE — Progress Notes (Signed)
PROGRESS NOTE    Joseph [name]  FFM:[id number] DOB: May 26, 1931 DOA: 10/24/2020 PCP: Gaynelle Arabian, MD    Chief Complaint  Patient presents with   Nausea   Emesis   Fever   UTI    Brief Narrative:   Joseph [name] is a 87 y.o. male with medical history significant of hypertension, diet-controlled diabetes mellitus, GERD, history of hiatal hernia, non-small cell lung cancer on the left s/p thoracotomy and lobectomy by Dr. Roxan Hockey in 2019, started having fevers and chills associated with nausea vomiting and abdominal pain, left-sided flank pain since Thursday. CT scan of the abdomen and pelvis without contrast shows 2 adjacent 5 mm stones in the right ureter causing dilatation of the ureter and moderate right hydronephrosis.  Urology consulted and he underwent cystoscopy and double-J stent in the right ureter. He was also found to be in AKI.    Assessment & Plan:   Active Problems:   GERD   Essential hypertension   Sepsis ([place])   Sepsis with obstructive uropathy on the right secondary to urinary tract infection Sepsis physiology is improving. Continue with IV fluids, IV Rocephin, keep MAP greater than 65 mmhg. Lactic acid normalized. Blood cultures sent and pending urine cultures ordered.  Patient underwent cystoscopy and double-J stent placed on the right ureter. Follow pro calcitonin.    Right sided hydronephrosis with AKI secondary to obstructing stones S/p cystoscopy and double-J stent placement on the right Appreciate urology input Continue with IV fluids and follow renal parameters.  Patient's baseline creatinine around 1 and he was admitted with a creatinine of 2.37. creatinine stable after fluids.      Diet controlled diabetes mellitus     Hypertension Blood pressure parameters are well controlled.    GERD History of hiatal hernia Continue with Protonix.       Acute thrombocytopenia Unclear etiology, probably secondary to sepsis and  infection. Continue to monitor.     Leukocytosis Probably secondary to UTI     Non-small cell lung cancer left upper lobe S/p thoracotomy, lobectomy by Dr Roxan Hockey in 2019. Outpatient follow up.    DVT prophylaxis: (Lovenox) Code Status: full code.  Family Communication: daughter at bedside.  Disposition:   Status is: Inpatient  Remains inpatient appropriate because:Ongoing diagnostic testing needed not appropriate for outpatient work up, Unsafe d/c plan, and IV treatments appropriate due to intensity of illness or inability to take PO  Dispo: The patient is from: Home              Anticipated d/c is to: Home              Patient currently is not medically stable to d/c.   Difficult to place patient No       Consultants:  Urology   Procedures: S/p cystoscopy and double-J stent placement on the right Antimicrobials:  Antibiotics Given (last 72 hours)     Date/Time Action Medication Dose Rate   10/24/20 1212 New Bag/Given   cefTRIAXone (ROCEPHIN) 2 g in sodium chloride 0.9 % 100 mL IVPB 2 g 200 mL/hr   10/25/20 1029 New Bag/Given   cefTRIAXone (ROCEPHIN) 2 g in sodium chloride 0.9 % 100 mL IVPB 2 g 200 mL/hr        Subjective: No new complaints.   Objective: Vitals:   10/24/20 2300 10/25/20 0046 10/25/20 0434 10/25/20 0931  BP:  (!) 154/95 (!) 150/85 (!) 146/86  Pulse: 99 93 92   Resp: 16  20  Temp: 98.9 F (37.2 C)  98.5 F (36.9 C) 98 F (36.7 C)  TempSrc: Oral  Oral Oral  SpO2: 97%  97%   Weight:      Height:        Intake/Output Summary (Last 24 hours) at 10/25/2020 1115 Last data filed at 10/25/2020 1029 Gross per 24 hour  Intake 1803.9 ml  Output 1200 ml  Net 603.9 ml   Filed Weights   10/24/20 1004 10/24/20 1650  Weight: 82.6 kg 84.6 kg    Examination:  General exam: Appears calm and comfortable  Respiratory system: Clear to auscultation. Respiratory effort normal. Cardiovascular system: S1 & S2 heard, RRR. No JVD,  No pedal  edema. Gastrointestinal system: Abdomen is nondistended, soft and nontender. Normal bowel sounds heard. Central nervous system: Alert and oriented. No focal neurological deficits. Extremities: Symmetric 5 x 5 power. Skin: No rashes, lesions or ulcers Psychiatry: Mood & affect appropriate.     Data Reviewed: I have personally reviewed following labs and imaging studies  CBC: Recent Labs  Lab 10/24/20 1010 10/25/20 0423  WBC 15.7* 10.9*  NEUTROABS 14.4* 10.0*  HGB 13.9 12.1*  HCT 42.7 37.4*  MCV 78.8* 79.4*  PLT 78* 80*    Basic Metabolic Panel: Recent Labs  Lab 10/24/20 1010 10/25/20 0423  NA 136 140  K 4.1 4.8  CL 102 112*  CO2 22 21*  GLUCOSE 173* 214*  BUN 66* 63*  CREATININE 2.37* 2.31*  CALCIUM 9.5 8.7*    GFR: Estimated Creatinine Clearance: 25.5 mL/min (A) (by C-G formula based on SCr of 2.31 mg/dL (H)).  Liver Function Tests: Recent Labs  Lab 10/24/20 1010  AST 21  ALT 15  ALKPHOS 77  BILITOT 0.9  PROT 6.5  ALBUMIN 3.4*    CBG: Recent Labs  Lab 10/24/20 1607  GLUCAP 86     Recent Results (from the past 240 hour(s))  Resp Panel by RT-PCR (Flu A&B, Covid) Nasopharyngeal Swab     Status: None   Collection Time: 10/24/20 11:57 AM   Specimen: Nasopharyngeal Swab; Nasopharyngeal(NP) swabs in vial transport medium  Result Value Ref Range Status   SARS Coronavirus 2 by RT PCR NEGATIVE NEGATIVE Final    Comment: (NOTE) SARS-CoV-2 target nucleic acids are NOT DETECTED.  The SARS-CoV-2 RNA is generally detectable in upper respiratory specimens during the acute phase of infection. The lowest concentration of SARS-CoV-2 viral copies this assay can detect is 138 copies/mL. A negative result does not preclude SARS-Cov-2 infection and should not be used as the sole basis for treatment or other patient management decisions. A negative result may occur with  improper specimen collection/handling, submission of specimen other than nasopharyngeal swab,  presence of viral mutation(s) within the areas targeted by this assay, and inadequate number of viral copies(<138 copies/mL). A negative result must be combined with clinical observations, patient history, and epidemiological information. The expected result is Negative.  Fact Sheet for Patients:  EntrepreneurPulse.com.au  Fact Sheet for Healthcare Providers:  IncredibleEmployment.be  This test is no t yet approved or cleared by the [place] FDA and  has been authorized for detection and/or diagnosis of SARS-CoV-2 by FDA under an Emergency Use Authorization (EUA). This EUA will remain  in effect (meaning this test can be used) for the duration of the COVID-19 declaration under Section 564(b)(1) of the Act, 21 U.S.C.section 360bbb-3(b)(1), unless the authorization is terminated  or revoked sooner.       Influenza A by PCR NEGATIVE NEGATIVE Final  Influenza B by PCR NEGATIVE NEGATIVE Final    Comment: (NOTE) The Xpert Xpress SARS-CoV-2/FLU/RSV plus assay is intended as an aid in the diagnosis of influenza from Nasopharyngeal swab specimens and should not be used as a sole basis for treatment. Nasal washings and aspirates are unacceptable for Xpert Xpress SARS-CoV-2/FLU/RSV testing.  Fact Sheet for Patients: EntrepreneurPulse.com.au  Fact Sheet for Healthcare Providers: IncredibleEmployment.be  This test is not yet approved or cleared by the [place] FDA and has been authorized for detection and/or diagnosis of SARS-CoV-2 by FDA under an Emergency Use Authorization (EUA). This EUA will remain in effect (meaning this test can be used) for the duration of the COVID-19 declaration under Section 564(b)(1) of the Act, 21 U.S.C. section 360bbb-3(b)(1), unless the authorization is terminated or revoked.  Performed at [facility], [address] [place], [place] [address]           Radiology Studies: CT ABDOMEN PELVIS WO CONTRAST  Result Date: 10/24/2020 CLINICAL DATA:  Abdominal pain.  Fever.  Possible pyelonephritis. EXAM: CT ABDOMEN AND PELVIS WITHOUT CONTRAST TECHNIQUE: Multidetector CT imaging of the abdomen and pelvis was performed following the standard protocol without IV contrast. COMPARISON:  12/02/2012. FINDINGS: Lower chest: No acute abnormality. Hepatobiliary: No focal liver abnormality is seen. No gallstones, gallbladder wall thickening, or biliary dilatation. Pancreas: No mass or inflammation. Spleen: Normal in size without focal abnormality. Adrenals/Urinary Tract: No adrenal masses. Two adjacent 5 mm stones in the right ureter, just below the pelvic brim, causing mild ureteral dilation and moderate right hydronephrosis. No left hydronephrosis. 7 mm nonobstructing stone, lower pole the right kidney. No other intrarenal stones. Low-attenuation renal masses, largest on the right, 8.6 cm, upper pole, largest on the left, 8.7 cm, lower pole, consistent with cysts. Additional right midpole renal mass, protruding exophytically from the cortex, hyperattenuating, average Hounsfield units of 62, measuring 2 cm in size, consistent with a hemorrhagic cyst. Normal left ureter.  Bladder is unremarkable. Stomach/Bowel: Small hiatal hernia. Stomach otherwise unremarkable. Small bowel and colon are normal in caliber. No wall thickening. No inflammation. Pan colonic diverticulosis. Vascular/Lymphatic: Aortic atherosclerosis. No enlarged lymph nodes. Reproductive: Prostate normal in size. Other: No ascites. Musculoskeletal: Total left hip prosthesis, incompletely imaged, but appearing well seated and aligned. No fracture or acute finding. No bone lesion. IMPRESSION: 1. Two adjacent 5 mm stones in the mid to distal right ureter, just below the pelvic brim, causing mild dilation of the ureter above the stones and moderate right hydronephrosis. 2. No other acute abnormality within  the abdomen or pelvis. 3. 7 mm nonobstructing stone in the lower pole the right kidney. 4. Bilateral renal masses consistent with cysts. A 2 cm right renal masses hyperattenuating consistent with a hemorrhagic cyst. 5. Aortic atherosclerosis.  Pan colonic diverticulosis. Electronically Signed   By: Lajean Manes M.D.   On: 10/24/2020 12:50   DG Chest Port 1 View  Result Date: 10/24/2020 CLINICAL DATA:  Thursday morning fever, N/V decreased urinary output. Went to PCP Thursday dx with UTI started on Levaquin. Pt has not been drinking and eating well, noted dizziness, fever, N/V. EXAM: PORTABLE CHEST 1 VIEW COMPARISON:  08/07/2018. FINDINGS: Cardiac silhouette normal in size.  No mediastinal or hilar masses. There is opacity at the left lung base obscuring the hemidiaphragm, similar to the prior study. Remainder of the lungs is clear. No evidence of a right pleural effusion. No pneumothorax. Skeletal structures are grossly intact. IMPRESSION: 1. No acute cardiopulmonary disease. 2. Chronic left  lung base opacity consistent with changes from prior lung surgery. Electronically Signed   By: Lajean Manes M.D.   On: 10/24/2020 10:50   DG C-Arm 1-60 Min-No Report  Result Date: 10/24/2020 Fluoroscopy was utilized by the requesting physician.  No radiographic interpretation.        Scheduled Meds:  [START ON 10/26/2020] COVID-19 mRNA Vac-TriS (Pfizer)  0.3 mL Intramuscular ONCE-1600   enoxaparin (LOVENOX) injection  30 mg Subcutaneous Q24H   influenza vaccine adjuvanted  0.5 mL Intramuscular Tomorrow-1000   insulin aspart  0-9 Units Subcutaneous TID WC   pantoprazole  40 mg Oral QAC breakfast   senna-docusate  2 tablet Oral BID   Continuous Infusions:  cefTRIAXone (ROCEPHIN)  IV 2 g (10/25/20 1029)   lactated ringers 100 mL/hr at 10/25/20 0429     LOS: 1 day        Hosie Poisson, MD [facility]   To contact the attending provider between 7A-7P or the covering provider during after  hours 7P-7A, please log into the web site [email or web address] and access using universal [place] password for that web site. If you do not have the password, please call the hospital operator.  10/25/2020, 11:15 AM

## 2020-10-26 ENCOUNTER — Inpatient Hospital Stay (HOSPITAL_COMMUNITY): Payer: PPO

## 2020-10-26 DIAGNOSIS — K219 Gastro-esophageal reflux disease without esophagitis: Secondary | ICD-10-CM | POA: Diagnosis not present

## 2020-10-26 DIAGNOSIS — Z7189 Other specified counseling: Secondary | ICD-10-CM | POA: Diagnosis not present

## 2020-10-26 DIAGNOSIS — Z515 Encounter for palliative care: Secondary | ICD-10-CM

## 2020-10-26 DIAGNOSIS — A419 Sepsis, unspecified organism: Secondary | ICD-10-CM | POA: Diagnosis not present

## 2020-10-26 DIAGNOSIS — I10 Essential (primary) hypertension: Secondary | ICD-10-CM | POA: Diagnosis not present

## 2020-10-26 DIAGNOSIS — R531 Weakness: Secondary | ICD-10-CM

## 2020-10-26 LAB — BASIC METABOLIC PANEL
Anion gap: 10 (ref 5–15)
BUN: 45 mg/dL — ABNORMAL HIGH (ref 8–23)
CO2: 22 mmol/L (ref 22–32)
Calcium: 8.3 mg/dL — ABNORMAL LOW (ref 8.9–10.3)
Chloride: 103 mmol/L (ref 98–111)
Creatinine, Ser: 1.61 mg/dL — ABNORMAL HIGH (ref 0.61–1.24)
GFR, Estimated: 41 mL/min — ABNORMAL LOW (ref >60)
Glucose, Bld: 129 mg/dL — ABNORMAL HIGH (ref 70–99)
Potassium: 3.7 mmol/L (ref 3.5–5.1)
Sodium: 135 mmol/L (ref 135–145)

## 2020-10-26 LAB — CBC WITH DIFFERENTIAL/PLATELET
Abs Immature Granulocytes: 0.16 10*3/uL — ABNORMAL HIGH (ref 0.00–0.07)
Basophils Absolute: 0 10*3/uL (ref 0.0–0.1)
Basophils Relative: 0 %
Eosinophils Absolute: 0.1 10*3/uL (ref 0.0–0.5)
Eosinophils Relative: 1 %
HCT: 37.8 % — ABNORMAL LOW (ref 39.0–52.0)
Hemoglobin: 12.5 g/dL — ABNORMAL LOW (ref 13.0–17.0)
Immature Granulocytes: 2 %
Lymphocytes Relative: 6 %
Lymphs Abs: 0.6 10*3/uL — ABNORMAL LOW (ref 0.7–4.0)
MCH: 25.8 pg — ABNORMAL LOW (ref 26.0–34.0)
MCHC: 33.1 g/dL (ref 30.0–36.0)
MCV: 77.9 fL — ABNORMAL LOW (ref 80.0–100.0)
Monocytes Absolute: 1.5 10*3/uL — ABNORMAL HIGH (ref 0.1–1.0)
Monocytes Relative: 15 %
Neutro Abs: 7.5 10*3/uL (ref 1.7–7.7)
Neutrophils Relative %: 76 %
Platelets: 87 10*3/uL — ABNORMAL LOW (ref 150–400)
RBC: 4.85 MIL/uL (ref 4.22–5.81)
RDW: 16.5 % — ABNORMAL HIGH (ref 11.5–15.5)
WBC: 9.8 10*3/uL (ref 4.0–10.5)
nRBC: 0 % (ref 0.0–0.2)

## 2020-10-26 LAB — GLUCOSE, CAPILLARY
Glucose-Capillary: 126 mg/dL — ABNORMAL HIGH (ref 70–99)
Glucose-Capillary: 116 mg/dL — ABNORMAL HIGH (ref 70–99)
Glucose-Capillary: 122 mg/dL — ABNORMAL HIGH (ref 70–99)
Glucose-Capillary: 130 mg/dL — ABNORMAL HIGH (ref 70–99)

## 2020-10-26 LAB — URINE CULTURE: Culture: NO GROWTH

## 2020-10-26 MED ORDER — ONDANSETRON HCL 4 MG/2ML IJ SOLN
4.0000 mg | Freq: Three times a day (TID) | INTRAMUSCULAR | Status: AC
  Administered 2020-10-26 – 2020-10-27 (×3): 4 mg via INTRAVENOUS
  Filled 2020-10-26 (×3): qty 2

## 2020-10-26 MED ORDER — FENTANYL CITRATE PF 50 MCG/ML IJ SOSY
12.5000 ug | PREFILLED_SYRINGE | INTRAMUSCULAR | Status: DC | PRN
Start: 2020-10-26 — End: 2020-10-27

## 2020-10-26 MED ORDER — FLEET ENEMA 7-19 GM/118ML RE ENEM: 1.0000 | ENEMA | Freq: Every day | RECTAL | Status: DC | PRN

## 2020-10-26 MED ORDER — BISACODYL 10 MG RE SUPP
10.0000 mg | Freq: Once | RECTAL | Status: AC
  Administered 2020-10-26: 10 mg via RECTAL
  Filled 2020-10-26: qty 1

## 2020-10-26 MED ORDER — METOPROLOL SUCCINATE ER 100 MG PO TB24
100.0000 mg | ORAL_TABLET | Freq: Every day | ORAL | Status: DC
  Administered 2020-10-26 – 2020-10-27 (×2): 100 mg via ORAL
  Filled 2020-10-26 (×2): qty 1

## 2020-10-26 MED ORDER — POLYETHYLENE GLYCOL 3350 17 G PO PACK: 17.0000 g | PACK | Freq: Every day | ORAL | Status: DC

## 2020-10-26 MED ORDER — ACETAMINOPHEN 500 MG PO TABS
500.0000 mg | ORAL_TABLET | Freq: Four times a day (QID) | ORAL | Status: AC
Start: 2020-10-26 — End: 2020-10-27
  Administered 2020-10-26 – 2020-10-27 (×4): 500 mg via ORAL
  Filled 2020-10-26 (×4): qty 1

## 2020-10-26 MED ORDER — OXYCODONE HCL 5 MG PO TABS: 5.0000 mg | ORAL_TABLET | Freq: Four times a day (QID) | ORAL | Status: DC | PRN

## 2020-10-26 NOTE — Evaluation (Signed)
Physical Therapy Evaluation Patient Details Name: Joseph [name] MRN: [id number] DOB: 1931-12-16 Today's Date: 10/26/2020  History of Present Illness  87 y.o. male with medical history significant of hypertension, diet-controlled diabetes mellitus, GERD, history of hiatal hernia, non-small cell lung cancer on the left s/p thoracotomy and lobectomy by Dr. Roxan Hockey in 2019, started having fevers and chills associated with nausea vomiting and abdominal pain, left-sided flank pain. Patient is status post right ureteral stent placement for 2 obstructing stones in the right mid ureter on 10/24/20  Clinical Impression  Pt admitted with above diagnosis.  Pt currently with functional limitations due to the deficits listed below (see PT Problem List). Pt will benefit from skilled PT to increase their independence and safety with mobility to allow discharge to the venue listed below.   Pt had just used bathroom and reports feeling better compared to this morning.  Pt able to tolerate ambulating in hallway and agreeable to remain in recliner end of session.  Pt appears to have good family support and encouraged to keep mobilizing during hospitalization to maintain independence.  Pt will likely not require f/u PT upon d/c.      Recommendations for follow up therapy are one component of a multi-disciplinary discharge planning process, led by the attending physician.  Recommendations may be updated based on patient status, additional functional criteria and insurance authorization.  Follow Up Recommendations No PT follow up;Supervision for mobility/OOB    Equipment Recommendations  None recommended by PT    Recommendations for Other Services       Precautions / Restrictions Precautions Precautions: Fall      Mobility  Bed Mobility Overal bed mobility: Needs Assistance Bed Mobility: Supine to Sit     Supine to sit: Supervision          Transfers Overall transfer level: Needs  assistance Equipment used: Rolling walker (2 wheeled) Transfers: Sit to/from Stand Sit to Stand: Min guard         General transfer comment: min/guard for safety, cues for hand placement  Ambulation/Gait Ambulation/Gait assistance: Min guard Gait Distance (Feet): 120 Feet Assistive device: Rolling walker (2 wheeled) Gait Pattern/deviations: Step-through pattern;Decreased stride length     General Gait Details: verbal cues for use of RW  Stairs            Wheelchair Mobility    Modified Rankin (Stroke Patients Only)       Balance           Standing balance support: Bilateral upper extremity supported Standing balance-Leahy Scale: Poor Standing balance comment: prefers UE support however likely able to stand statically without                             Pertinent Vitals/Pain Pain Assessment: No/denies pain Pain Intervention(s): Monitored during session;Repositioned    Home Living Family/patient expects to be discharged to:: Private residence Living Arrangements: Alone Available Help at Discharge: Family;Available PRN/intermittently Type of Home: House Home Access: Stairs to enter Entrance Stairs-Rails: None Entrance Stairs-Number of Steps: 4 Home Layout: One level Home Equipment: Bedside commode;Shower seat;Walker - 2 wheels;Cane - single point Additional Comments: patients daughter assists during the week as needed and can provide assist on weekend as well per report this AM    Prior Function Level of Independence: Independent         Comments: able to be on a tractor prior to admission; goes to beach with his son  Hand Dominance   Dominant Hand: Right    Extremity/Trunk Assessment        Lower Extremity Assessment Lower Extremity Assessment: Overall WFL for tasks assessed    Cervical / Trunk Assessment Cervical / Trunk Assessment: Normal  Communication   Communication: HOH  Cognition Arousal/Alertness:  Awake/alert Behavior During Therapy: WFL for tasks assessed/performed Overall Cognitive Status: Within Functional Limits for tasks assessed                                        General Comments      Exercises     Assessment/Plan    PT Assessment Patient needs continued PT services  PT Problem List Decreased knowledge of use of DME;Decreased mobility;Decreased strength       PT Treatment Interventions Gait training;DME instruction;Therapeutic exercise;Balance training;Functional mobility training;Therapeutic activities;Patient/family education;Stair training    PT Goals (Current goals can be found in the Care Plan section)  Acute Rehab PT Goals PT Goal Formulation: With patient Time For Goal Achievement: 11/09/20 Potential to Achieve Goals: Good    Frequency Min 3X/week   Barriers to discharge        Co-evaluation               AM-PAC PT "6 Clicks" Mobility  Outcome Measure Help needed turning from your back to your side while in a flat bed without using bedrails?: A Little Help needed moving from lying on your back to sitting on the side of a flat bed without using bedrails?: A Little Help needed moving to and from a bed to a chair (including a wheelchair)?: A Little Help needed standing up from a chair using your arms (e.g., wheelchair or bedside chair)?: A Little Help needed to walk in hospital room?: A Little Help needed climbing 3-5 steps with a railing? : A Little 6 Click Score: 18    End of Session Equipment Utilized During Treatment: Gait belt Activity Tolerance: Patient tolerated treatment well Patient left: in chair;with call bell/phone within reach;with family/visitor present Nurse Communication: Mobility status PT Visit Diagnosis: Difficulty in walking, not elsewhere classified (R26.2)    Time: 3734-2876 PT Time Calculation (min) (ACUTE ONLY): 16 min   Charges:   PT Evaluation $PT Eval Low Complexity: 1 Low        Kati  PT, DPT Acute Rehabilitation Services Pager: [phone] Office: [place] 10/26/2020, 3:02 PM

## 2020-10-26 NOTE — Anesthesia Postprocedure Evaluation (Signed)
Anesthesia Post Note  Patient: Gavon Claudette Laws  Procedure(s) Performed: CYSTOSCOPY WITH RETROGRADE PYELOGRAM, URETEROSCOPY AND STENT PLACEMENT (Right: Ureter)     Patient location during evaluation: PACU Anesthesia Type: General Level of consciousness: awake and alert Pain management: pain level controlled Vital Signs Assessment: post-procedure vital signs reviewed and stable Respiratory status: spontaneous breathing, nonlabored ventilation, respiratory function stable and patient connected to nasal cannula oxygen Cardiovascular status: blood pressure returned to baseline and stable Postop Assessment: no apparent nausea or vomiting Anesthetic complications: no   No notable events documented.  Last Vitals:  Vitals:   10/25/20 2005 10/26/20 0600  BP: (!) 156/87 (!) 154/91  Pulse: 97 96  Resp: 16 15  Temp: 36.9 C 37.2 C  SpO2: 98% 96%    Last Pain:  Vitals:   10/26/20 0628  TempSrc:   PainSc: [place]

## 2020-10-26 NOTE — Plan of Care (Signed)
  Problem: Health Behavior/Discharge Planning: Goal: Ability to manage health-related needs will improve Outcome: Progressing   Problem: Activity: Goal: Risk for activity intolerance will decrease Outcome: Progressing   Problem: Nutrition: Goal: Adequate nutrition will be maintained Outcome: Progressing   Problem: Pain Managment: Goal: General experience of comfort will improve Outcome: Progressing   Problem: Elimination: Goal: Will not experience complications related to bowel motility Outcome: Progressing Goal: Will not experience complications related to urinary retention Outcome: Progressing

## 2020-10-26 NOTE — TOC Initial Note (Addendum)
Transition of Care [facility]) - Initial/Assessment Note    Patient Details  Name: Joseph [name] MRN: [id number] Date of Birth: 14-Mar-1931  [facility] CM/SW Contact:    Joseph Phi, RN Phone Number: 10/26/2020, 3:25 PM  Clinical Narrative:  Joseph [name] able to accept for HHPT/OT only-rep [facility] following. Joseph [name] dtr 1st contact person c#[phone].  3:44p-  -HHPT/OT ordered-PT recc no PT f/u-the insurance will not pay for [facility] if not recc-Therefore No HH services needed-I have already spoken to dtr Joseph [name]-she understands.             Expected Discharge Plan: [place] Barriers to Discharge: Continued Medical Work up   Patient Goals and CMS Choice Patient states their goals for this hospitalization and ongoing recovery are:: go home CMS Medicare.gov Compare Post Acute Care list provided to:: Patient Represenative (must comment) Joseph [name] [phone]) Choice offered to / list presented to : Adult Children  Expected Discharge Plan and Services Expected Discharge Plan: [place]   Discharge Planning Services: CM Consult Post Acute Care Choice: [place] arrangements for the past 2 months: Single Family Home                           HH Arranged: PT, OT HH Agency: [place] Date [facility] Agency Contacted: 10/26/20 Time HH Agency Contacted: 56 Representative spoke with at [place]: [place] Arrangements/Services Living arrangements for the past 2 months: [place] Lives with:: Spouse Patient language and need for interpreter reviewed:: Yes Do you feel safe going back to the place where you live?: Yes      Need for Family Participation in Patient Care: No (Comment) Care giver support system in place?: Yes (comment)   Criminal Activity/Legal Involvement Pertinent to Current Situation/Hospitalization: No - Comment as needed  Activities of Daily Living Home Assistive Devices/Equipment: Hearing  aid, Dentures (specify type) ADL Screening (condition at time of admission) Patient's cognitive ability adequate to safely complete daily activities?: Yes Is the patient deaf or have difficulty hearing?: No Does the patient have difficulty seeing, even when wearing glasses/contacts?: Yes Does the patient have difficulty concentrating, remembering, or making decisions?: No Patient able to express need for assistance with ADLs?: Yes Does the patient have difficulty dressing or bathing?: No Independently performs ADLs?: Yes (appropriate for developmental age) Does the patient have difficulty walking or climbing stairs?: No Weakness of Legs: None Weakness of Arms/Hands: None  Permission Sought/Granted   Permission granted to share information with : Yes, Verbal Permission Granted  Share Information with NAME: Care Manager     Permission granted to share info w Relationship: Joseph [name] dtr 54 [phone]     Emotional Assessment Appearance:: Appears stated age Attitude/Demeanor/Rapport: Gracious Affect (typically observed): Accepting Orientation: : Oriented to Self, Oriented to Place, Oriented to  Time, Oriented to Situation Alcohol / Substance Use: Not Applicable Psych Involvement: No (comment)  Admission diagnosis:  SOB (shortness of breath) [R06.02] Sepsis ([place]) [A41.9] Patient Active Problem List   Diagnosis Date Noted   Sepsis ([place]) 10/24/2020   Vertigo    Essential hypertension    Iron deficiency anemia    Fracture, proximal femur ([place]) 03/19/2014   Painful orthopaedic hardware ([place]) 01/13/2013   Fall at home 01/18/2012   Hip fracture, left ([place]) 01/18/2012   Femur fracture, left ([place]) 01/18/2012   ESOPHAGEAL MOTILITY DISORDER 11/16/2009   PERSONAL  HX PROSTATE CANCER 11/16/2009   DIVERTICULITIS-COLON 08/30/2009   CONSTIPATION 08/30/2009   HYPERTENSION 07/28/2009   GERD 07/28/2009   DIVERTICULOSIS-COLON 07/28/2009   ABDOMINAL PAIN, LEFT UPPER QUADRANT 07/28/2009   ABDOMINAL  PAIN, LEFT LOWER QUADRANT 07/28/2009   PERSONAL HX COLONIC POLYPS 07/28/2009   ACHALASIA 03/09/2009   RECTAL BLEEDING 03/09/2009   NONSPECIFIC ABN FINDING RAD & OTH EXAM GI TRACT 02/25/2009   DYSPHAGIA UNSPECIFIED 02/24/2009   PCP:  Gaynelle Arabian, MD Pharmacy:   [id number] - [place], [place] - [address] [place] [address] Phone: [phone] Fax: [phone]     Social Determinants of Health (SDOH) Interventions    Readmission Risk Interventions No flowsheet data found.

## 2020-10-26 NOTE — Consult Note (Signed)
Consultation Note Date: 10/26/2020   Patient Name: Joseph [name]  DOB: 1931/06/09  MRN: [id number]  Age / Sex: 87 y.o., male  PCP: Gaynelle Arabian, MD Referring Physician: Hosie Poisson, MD  Reason for Consultation: Non pain symptom management and Pain control  HPI/Patient Profile: 87 y.o. male  admitted on 10/24/2020    Clinical Assessment and Goals of Care: 87 year old gentleman who lives by himself, daughter is with him throughout the day, was independent with all activities of daily living up until most recent hospitalization.  He has past medical history of hypertension diet-controlled diabetes GERD history of hiatal hernia and history of non-small cell lung cancer on the left lung.  Was diagnosed with primary adenocarcinoma and underwent left-sided thoracotomy as well as lobectomy.  He completed all of his cancer treatments and is currently enrolled in cancer survivorship clinic at [facility].  Most recently he had an appointment in June 2022 where scans were negative for any kind of recurrence.  Patient's baseline is such that he is able to mow his yard, he does not need a cane or walker and is independent with activities of daily living.  However, patient has been having fevers chills nausea vomiting abdominal pain as well as left-sided flank pain since the past several days for which he sought treatment in the emergency department.  CT scan of the abdomen and pelvis revealed 2 adjacent 5 mm stones in his right ureter causing dilation as well as moderate right-sided hydronephrosis.  Hospital course complicated by acute kidney injury, admitted to hospital medicine service with urology colleagues following and the patient has undergone cystoscopy and double-J stent placement on the right side. Over the course of the past 24 hours, patient has had back pain nausea vomiting and uncontrolled symptoms.  Palliative  medicine consultation for pain and on pain symptom management has been requested.  Patient is awake alert resting in bed.  His daughter is present at the bedside.  Chart reviewed, advance care planning documents noted.  Patient has completed advanced directives-healthcare power of attorney document scanned designating his son and daughter as his healthcare power of attorney agents.  He also has a granddaughter listed in contact list.  I introduced myself and palliative care as follows: Palliative medicine is specialized medical care for people living with serious illness. It focuses on providing relief from the symptoms and stress of a serious illness. The goal is to improve quality of life for both the patient and the family. Goals of care: Broad aims of medical therapy in relation to the patient's values and preferences. Our aim is to provide medical care aimed at enabling patients to achieve the goals that matter most to them, given the circumstances of their particular medical situation and their constraints.   We discussed about pain and on pain symptom management.  Patient is opioid nave, at home he does not take any pain medications on a regular basis.  Has not had nausea vomiting likely exacerbated by use of oral oxycodone.  Additionally he is  also complaining of constipation.  Patient has back discomfort.  He has been getting Zofran which she states has helped.  Discussed about scheduling Tylenol, spacing out the oxycodone immediate release on an as-needed basis as well as scheduling Zofran at least for 24-hour time.  To monitor relief from symptoms.  Even though he is on regular diet, advised patient to try out clear to full liquids for 24 hours today.  He is already on a bowel regimen, will elect for one-time Dulcolax suppository for relief of symptoms.  HCPOA Son and daughter, advance care planning documents on chart  SUMMARY OF RECOMMENDATIONS   Full code/full scope care Recommend home  with home-based health care home-based physical therapy towards the end of this hospitalization Symptom management: Scheduled Zofran for 24 hours Scheduled Tylenol for 24 hours p.o. Extend out the interval for oxycodone immediate release Bowel regimen Thank you for the consult.  Code Status/Advance Care Planning: Full code   Symptom Management:  As above  Palliative Prophylaxis:  Bowel Regimen  Additional Recommendations (Limitations, Scope, Preferences): Full Scope Treatment  Psycho-social/Spiritual:  Desire for further Chaplaincy support:yes Additional Recommendations: Caregiving  Support/Resources  Prognosis:  Unable to determine  Discharge Planning: Home with Home Health      Primary Diagnoses: Present on Admission:  Sepsis ([place])  Essential hypertension  GERD   I have reviewed the medical record, interviewed the patient and family, and examined the patient. The following aspects are pertinent.  Past Medical History:  Diagnosis Date   Arthritis    Cancer ([place])    HX PROSTATE CANCER - MANY YRS AGO - ? RADIATION [place]. Lung cancer   Diabetes mellitus without complication ([place])    DIET CONTROLLED   GERD (gastroesophageal reflux disease)    Hearing loss    History of hiatal hernia    History of kidney stones    History of nonunion of fracture    LEFT PROXIMAL FEMUR   Hypertension    Pneumonia    Seasonal allergies    Social History   Socioeconomic History   Marital status: Widowed    Spouse name: Not on file   Number of children: Not on file   Years of education: Not on file   Highest education level: Not on file  Occupational History   Not on file  Tobacco Use   Smoking status: Never   Smokeless tobacco: Never  Vaping Use   Vaping Use: Never used  Substance and Sexual Activity   Alcohol use: No   Drug use: No   Sexual activity: Not on file  Other Topics Concern   Not on file  Social History Narrative   Not on file   Social Determinants of  Health   Financial Resource Strain: Not on file  Food Insecurity: Not on file  Transportation Needs: Not on file  Physical Activity: Not on file  Stress: Not on file  Social Connections: Not on file   Family History  Problem Relation Age of Onset   Heart attack Mother    Hypertension Mother    Heart attack Father    Hypertension Father    Colon cancer Neg Hx    Esophageal cancer Neg Hx    Rectal cancer Neg Hx    Stomach cancer Neg Hx    Scheduled Meds:  acetaminophen  500 mg Oral Q6H   bisacodyl  10 mg Rectal Once   COVID-19 mRNA bivalent vaccine (Pfizer)  0.3 mL Intramuscular ONCE-1600   enoxaparin (LOVENOX) injection  30 mg Subcutaneous Q24H   influenza vaccine adjuvanted  0.5 mL Intramuscular Tomorrow-1000   insulin aspart  0-9 Units Subcutaneous TID WC   metoprolol succinate  100 mg Oral Q breakfast   ondansetron (ZOFRAN) IV  4 mg Intravenous Q8H   pantoprazole  40 mg Oral QAC breakfast   senna-docusate  2 tablet Oral BID   Continuous Infusions:  cefTRIAXone (ROCEPHIN)  IV 2 g (10/25/20 1029)   lactated ringers 100 mL/hr at 10/26/20 0937   PRN Meds:.acetaminophen **OR** acetaminophen, ipratropium, loratadine, ondansetron **OR** ondansetron (ZOFRAN) IV, oxyCODONE Medications Prior to Admission:  Prior to Admission medications   Medication Sig Start Date End Date Taking? Authorizing Provider  acetaminophen (TYLENOL) 500 MG tablet Take 500-1,000 mg by mouth every 8 (eight) hours as needed for moderate pain or headache.   Yes [provider]  Ascorbic Acid (VITAMIN C PO) Take 1 packet by mouth daily.   Yes [provider]  aspirin EC 81 MG tablet Take 81 mg by mouth as directed. MWF   Yes [provider]  ibuprofen (ADVIL,MOTRIN) 200 MG tablet Take 400-600 mg by mouth every 8 (eight) hours as needed (for pain.).   Yes [provider]  ipratropium (ATROVENT) 0.06 % nasal spray Place 1 spray into both nostrils 3 (three) times daily as  needed (allergies).   Yes [provider]  levofloxacin (LEVAQUIN) 750 MG tablet Take 750 mg by mouth daily. 10/21/20  Yes [provider]  lisinopril (PRINIVIL,ZESTRIL) 20 MG tablet Take 20 mg by mouth daily.    Yes [provider]  loratadine (CLARITIN) 10 MG tablet Take 10 mg by mouth daily.   Yes [provider]  metoprolol succinate (TOPROL-XL) 100 MG 24 hr tablet Take 100 mg by mouth daily with breakfast.    Yes [provider]  Misc Natural Products (ELDERBERRY IMMUNE COMPLEX) CHEW Chew 1 tablet by mouth daily.   Yes [provider]  Multiple Vitamins-Minerals (MULTIVITAMIN ADULTS) TABS Take 1 tablet by mouth daily.   Yes [provider]  ondansetron (ZOFRAN) 4 MG tablet Take 4 mg by mouth every 8 (eight) hours as needed for nausea or vomiting.   Yes [provider]  pantoprazole (PROTONIX) 40 MG tablet Take 40 mg by mouth daily before breakfast.    Yes [provider]  amoxicillin (AMOXIL) 500 MG capsule Take 2,000 mg by mouth See admin instructions. Take 4 capsules (2000 mg) by mouth 1 hours prior to procedure.    [provider]  HYDROcodone-homatropine (HYCODAN) 5-1.5 MG/5ML syrup Take 5 mLs by mouth every 6 (six) hours as needed for cough. Patient not taking: No sig reported 02/28/17   [provider]  meclizine (ANTIVERT) 25 MG tablet Take 1 tablet (25 mg total) by mouth 2 (two) times daily as needed for dizziness. Patient not taking: No sig reported 11/16/14   Ripley Fraise, MD   No Known Allergies Review of Systems + nausea +back pain  Physical Exam Patient is awake alert resting in bed Complains of back pain, complains of nausea Regular work of breathing S1-S2 Abdomen is not distended nontender No focal deficits awake alert oriented No edema  Vital Signs: BP (!) 154/91 (BP Location: Right Arm)   Pulse 96   Temp 98.9 F (37.2 C) (Oral)   Resp 15   Ht 6\' 1"  (1.854 m)    Wt 84.6 kg   SpO2 96%   BMI 24.61 kg/m  Pain Scale: 0-10   Pain Score:  6    SpO2: SpO2: 96 % O2 Device:SpO2: 96 % O2 Flow Rate: .O2 Flow Rate (L/min): 2 L/min  IO: Intake/output summary:  Intake/Output Summary (Last 24 hours) at 10/26/2020 1022 Last data filed at 10/26/2020 0918 Gross per 24 hour  Intake 3479.77 ml  Output 2400 ml  Net 1079.77 ml    LBM: Last BM Date: 10/24/20 Baseline Weight: Weight: 82.6 kg Most recent weight: Weight: 84.6 kg     Palliative Assessment/Data:   PPS 80%  Time In:  9.20 Time Out:  10.20 Time Total:  60 min.  Greater than 50%  of this time was spent counseling and coordinating care related to the above assessment and plan.  Signed by: Loistine Chance, MD   Please contact Palliative Medicine Team phone at [phone] for questions and concerns.  For individual provider: See Shea Evans

## 2020-10-26 NOTE — Progress Notes (Signed)
PROGRESS NOTE    Joseph [name]  GEX:[id number] DOB: 1931/12/20 DOA: 10/24/2020 PCP: Gaynelle Arabian, MD    Chief Complaint  Patient presents with   Nausea   Emesis   Fever   UTI    Brief Narrative:   Joseph [name] is a 87 y.o. male with medical history significant of hypertension, diet-controlled diabetes mellitus, GERD, history of hiatal hernia, non-small cell lung cancer on the left s/p thoracotomy and lobectomy by Dr. Roxan Hockey in 2019, started having fevers and chills associated with nausea vomiting and abdominal pain, left-sided flank pain since Thursday. CT scan of the abdomen and pelvis without contrast shows 2 adjacent 5 mm stones in the right ureter causing dilatation of the ureter and moderate right hydronephrosis.  Urology consulted and he underwent cystoscopy and double-J stent in the right ureter. He was also found to be in AKI.    Assessment & Plan:   Active Problems:   GERD   Essential hypertension   Sepsis ([place])   Sepsis with obstructive uropathy on the right secondary to urinary tract infection Sepsis physiology is improving. Continue with IV fluids, IV Rocephin, keep MAP greater than 65 mmhg. Lactic acid normalized. Blood cultures sent and pending urine cultures ordered.  Patient underwent cystoscopy and double-J stent placed on the right ureter. Follow pro calcitonin.    Right sided hydronephrosis with AKI secondary to obstructing stones S/p cystoscopy and double-J stent placement on the right Appreciate urology input Continue with IV fluids and follow renal parameters.  Patient's baseline creatinine around 1 and he was admitted with a creatinine of 2.37. cretinine improved with IV fluids to 1.6.      Diet controlled diabetes mellitus     Hypertension Blood pressure parameters are optimal.    GERD History of hiatal hernia Continue with Protonix.       Acute thrombocytopenia Unclear etiology, probably secondary to sepsis and  infection. Improving.  Continue to monitor.     Leukocytosis Probably secondary to UTI. Resolved.      Non-small cell lung cancer left upper lobe S/p thoracotomy, lobectomy by Dr Roxan Hockey in 2019. Outpatient follow up.   Constipation/ nausea, abd distention.  - abd films.  -continue with senna, Miralax .    DVT prophylaxis: (Lovenox) Code Status: full code.  Family Communication: daughter at bedside.  Disposition:   Status is: Inpatient  Remains inpatient appropriate because:Ongoing diagnostic testing needed not appropriate for outpatient work up, Unsafe d/c plan, and IV treatments appropriate due to intensity of illness or inability to take PO  Dispo: The patient is from: Home              Anticipated d/c is to: Home              Patient currently is not medically stable to d/c.   Difficult to place patient No       Consultants:  Urology   Procedures: S/p cystoscopy and double-J stent placement on the right Antimicrobials:  Antibiotics Given (last 72 hours)     Date/Time Action Medication Dose Rate   10/24/20 1212 New Bag/Given   cefTRIAXone (ROCEPHIN) 2 g in sodium chloride 0.9 % 100 mL IVPB 2 g 200 mL/hr   10/25/20 1029 New Bag/Given   cefTRIAXone (ROCEPHIN) 2 g in sodium chloride 0.9 % 100 mL IVPB 2 g 200 mL/hr   10/26/20 1249 New Bag/Given   cefTRIAXone (ROCEPHIN) 2 g in sodium chloride 0.9 % 100 mL IVPB 2 g 200 mL/hr  Subjective: Nauseated, vomited.   Objective: Vitals:   10/25/20 1235 10/25/20 2005 10/26/20 0600 10/26/20 1342  BP: (!) 154/99 (!) 156/87 (!) 154/91 (!) 143/84  Pulse: 92 97 96 88  Resp: 18 16 15 16   Temp: 98.2 F (36.8 C) 98.4 F (36.9 C) 98.9 F (37.2 C) 98.6 F (37 C)  TempSrc: Oral Oral Oral Oral  SpO2: 97% 98% 96% 95%  Weight:      Height:        Intake/Output Summary (Last 24 hours) at 10/26/2020 1424 Last data filed at 10/26/2020 1405 Gross per 24 hour  Intake 2323.37 ml  Output 2850 ml  Net -526.63 ml     Filed Weights   10/24/20 1004 10/24/20 1650  Weight: 82.6 kg 84.6 kg    Examination:  General exam: Appears calm and comfortable  Respiratory system: Clear to auscultation. Respiratory effort normal. Cardiovascular system: S1 & S2 heard, RRR. No JVD, No pedal edema. Gastrointestinal system: Abdomen is nondistended, soft and nontender.  Normal bowel sounds heard. Central nervous system: Alert and oriented. No focal neurological deficits. Extremities: Symmetric 5 x 5 power. Skin: No rashes, lesions or ulcers Psychiatry:  Mood & affect appropriate.      Data Reviewed: I have personally reviewed following labs and imaging studies  CBC: Recent Labs  Lab 10/24/20 1010 10/25/20 0423 10/26/20 0840  WBC 15.7* 10.9* 9.8  NEUTROABS 14.4* 10.0* 7.5  HGB 13.9 12.1* 12.5*  HCT 42.7 37.4* 37.8*  MCV 78.8* 79.4* 77.9*  PLT 78* 80* 87*     Basic Metabolic Panel: Recent Labs  Lab 10/24/20 1010 10/25/20 0423 10/26/20 0840  NA 136 140 135  K 4.1 4.8 3.7  CL 102 112* 103  CO2 22 21* 22  GLUCOSE 173* 214* 129*  BUN 66* 63* 45*  CREATININE 2.37* 2.31* 1.61*  CALCIUM 9.5 8.7* 8.3*     GFR: Estimated Creatinine Clearance: 36.5 mL/min (A) (by C-G formula based on SCr of 1.61 mg/dL (H)).  Liver Function Tests: Recent Labs  Lab 10/24/20 1010  AST 21  ALT 15  ALKPHOS 77  BILITOT 0.9  PROT 6.5  ALBUMIN 3.4*     CBG: Recent Labs  Lab 10/25/20 1156 10/25/20 1631 10/25/20 2006 10/26/20 0720 10/26/20 1111  GLUCAP 187* 195* 148* 126* 116*      Recent Results (from the past 240 hour(s))  Culture, blood (Routine x 2)     Status: None (Preliminary result)   Collection Time: 10/24/20 10:30 AM   Specimen: BLOOD  Result Value Ref Range Status   Specimen Description   Final    BLOOD BLOOD RIGHT FOREARM Performed at [facility], [address] [place], [place] [address]    Special Requests   Final    BOTTLES DRAWN AEROBIC AND ANAEROBIC  Blood Culture adequate volume Performed at [facility], [address] [place], [place] [address]    Culture   Final    NO GROWTH < 24 HOURS Performed at [facility], [place] [address], [place], [place] [address]    Report Status PENDING  Incomplete  Culture, blood (Routine x 2)     Status: None (Preliminary result)   Collection Time: 10/24/20 11:50 AM   Specimen: BLOOD  Result Value Ref Range Status   Specimen Description   Final    BLOOD LEFT ANTECUBITAL Performed at [facility], [address] [place], [place] [address]    Special Requests   Final  BOTTLES DRAWN AEROBIC AND ANAEROBIC Blood Culture adequate volume Performed at [facility], [address] [place], [place] [address]    Culture   Final    NO GROWTH < 24 HOURS Performed at [facility], [place] [address] [place], [place] [address]    Report Status PENDING  Incomplete  Resp Panel by RT-PCR (Flu A&B, Covid) Nasopharyngeal Swab     Status: None   Collection Time: 10/24/20 11:57 AM   Specimen: Nasopharyngeal Swab; Nasopharyngeal(NP) swabs in vial transport medium  Result Value Ref Range Status   SARS Coronavirus 2 by RT PCR NEGATIVE NEGATIVE Final    Comment: (NOTE) SARS-CoV-2 target nucleic acids are NOT DETECTED.  The SARS-CoV-2 RNA is generally detectable in upper respiratory specimens during the acute phase of infection. The lowest concentration of SARS-CoV-2 viral copies this assay can detect is 138 copies/mL. A negative result does not preclude SARS-Cov-2 infection and should not be used as the sole basis for treatment or other patient management decisions. A negative result may occur with  improper specimen collection/handling, submission of specimen other than nasopharyngeal swab, presence of viral mutation(s) within the areas targeted by this assay, and inadequate number of viral copies(<138 copies/mL). A negative result must be  combined with clinical observations, patient history, and epidemiological information. The expected result is Negative.  Fact Sheet for Patients:  EntrepreneurPulse.com.au  Fact Sheet for Healthcare Providers:  IncredibleEmployment.be  This test is no t yet approved or cleared by the [place] FDA and  has been authorized for detection and/or diagnosis of SARS-CoV-2 by FDA under an Emergency Use Authorization (EUA). This EUA will remain  in effect (meaning this test can be used) for the duration of the COVID-19 declaration under Section 564(b)(1) of the Act, 21 U.S.C.section 360bbb-3(b)(1), unless the authorization is terminated  or revoked sooner.       Influenza A by PCR NEGATIVE NEGATIVE Final   Influenza B by PCR NEGATIVE NEGATIVE Final    Comment: (NOTE) The Xpert Xpress SARS-CoV-2/FLU/RSV plus assay is intended as an aid in the diagnosis of influenza from Nasopharyngeal swab specimens and should not be used as a sole basis for treatment. Nasal washings and aspirates are unacceptable for Xpert Xpress SARS-CoV-2/FLU/RSV testing.  Fact Sheet for Patients: EntrepreneurPulse.com.au  Fact Sheet for Healthcare Providers: IncredibleEmployment.be  This test is not yet approved or cleared by the [place] FDA and has been authorized for detection and/or diagnosis of SARS-CoV-2 by FDA under an Emergency Use Authorization (EUA). This EUA will remain in effect (meaning this test can be used) for the duration of the COVID-19 declaration under Section 564(b)(1) of the Act, 21 U.S.C. section 360bbb-3(b)(1), unless the authorization is terminated or revoked.  Performed at [facility], [address] [place], [place] [address]           Radiology Studies: DG C-Arm 1-60 Min-No Report  Result Date: 10/24/2020 Fluoroscopy was utilized by the requesting physician.  No  radiographic interpretation.        Scheduled Meds:  acetaminophen  500 mg Oral Q6H   COVID-19 mRNA bivalent vaccine (Pfizer)  0.3 mL Intramuscular ONCE-1600   enoxaparin (LOVENOX) injection  30 mg Subcutaneous Q24H   influenza vaccine adjuvanted  0.5 mL Intramuscular Tomorrow-1000   insulin aspart  0-9 Units Subcutaneous TID WC   metoprolol succinate  100 mg Oral Q breakfast   ondansetron (ZOFRAN) IV  4 mg Intravenous Q8H   pantoprazole  40 mg Oral QAC breakfast  polyethylene glycol  17 g Oral Daily   senna-docusate  2 tablet Oral BID   Continuous Infusions:  cefTRIAXone (ROCEPHIN)  IV 2 g (10/26/20 1249)   lactated ringers 100 mL/hr at 10/26/20 0937     LOS: 2 days        Hosie Poisson, MD [facility]   To contact the attending provider between 7A-7P or the covering provider during after hours 7P-7A, please log into the web site [email or web address] and access using universal [place] password for that web site. If you do not have the password, please call the hospital operator.  10/26/2020, 2:24 PM

## 2020-10-26 NOTE — Evaluation (Signed)
Occupational Therapy Evaluation Patient Details Name: Joseph [name] MRN: [id number] DOB: 1932/01/24 Today's Date: 10/26/2020   History of Present Illness Patient is a 87 year old male who presented to the hosptial on 9/25 with fever and chills. patient was found to have sepsis, obstructive uropathy, UTI and right sided hydrocephrosis. PMH: THA, HTN. DM, GERD, kidney stones, hiatal hernia, nonsmall cell lung cancer.   Clinical Impression   Patient is a 87 year old male who was admitted for above. Patient was living at home alone with Independence in all tasks prior level. Currently, patient is min A for transfers, and max A for LB dressing tasks.  Patient was noted to have increased pain/nausea, decreased functional activity tolerance, decreased endurance, decreased standing balance impacting patients ability to engage in ADL tasks at prior level. Patient would continue to benefit from skilled OT services at this time while admitted and after d/c to address noted deficits in order to improve overall safety and independence in ADLs.       Recommendations for follow up therapy are one component of a multi-disciplinary discharge planning process, led by the attending physician.  Recommendations may be updated based on patient status, additional functional criteria and insurance authorization.   Follow Up Recommendations  Home health OT;Supervision/Assistance - 24 hour  (Pending progress in acute setting)  Equipment Recommendations  Other (comment) (TBD)    Recommendations for Other Services       Precautions / Restrictions Restrictions Weight Bearing Restrictions: No      Mobility Bed Mobility Overal bed mobility: Needs Assistance Bed Mobility: Supine to Sit     Supine to sit: Min assist;HOB elevated          Transfers Overall transfer level: Needs assistance Equipment used: Rolling walker (2 wheeled) Transfers: Stand Pivot Transfers   Stand pivot transfers: Min assist        General transfer comment: with education on proper hand and foot placement.    Balance                                           ADL either performed or assessed with clinical judgement   ADL Overall ADL's : Needs assistance/impaired Eating/Feeding: Sitting;Set up   Grooming: Bed level;Set up   Upper Body Bathing: Bed level;Set up   Lower Body Bathing: Bed level;Maximal assistance   Upper Body Dressing : Bed level;Set up   Lower Body Dressing: Bed level;Maximal assistance Lower Body Dressing Details (indicate cue type and reason): patient declined to attempt to don/doff socks on this date reporting that it was too painful to attempt. Toilet Transfer: Minimal Insurance claims handler Details (indicate cue type and reason): patient was able to transfer from edge of bed to recliner and back to bed on this date with patient increasingly nauseated with movements. patinet was able to maintain standing balance more appropriately with RW. Toileting- Water quality scientist and Hygiene: Sit to/from stand;Moderate assistance         General ADL Comments: patients daughter was present during session with patient reporting having increased nausea and pain in back on this date.     Vision Patient Visual Report: No change from baseline       Perception     Praxis      Pertinent Vitals/Pain Pain Assessment: 0-10 Pain Score: 8  Pain Location: low back Pain Intervention(s): Monitored during session;Premedicated before session;Repositioned  Hand Dominance Right   Extremity/Trunk Assessment Upper Extremity Assessment Upper Extremity Assessment: Overall WFL for tasks assessed   Lower Extremity Assessment Lower Extremity Assessment: Defer to PT evaluation   Cervical / Trunk Assessment Cervical / Trunk Assessment: Normal   Communication Communication Communication: HOH   Cognition Arousal/Alertness: Awake/alert Behavior During Therapy: WFL for  tasks assessed/performed Overall Cognitive Status: Within Functional Limits for tasks assessed                                     General Comments       Exercises     Shoulder Instructions      Home Living Family/patient expects to be discharged to:: Private residence Living Arrangements: Alone Available Help at Discharge: Family;Available PRN/intermittently Type of Home: House Home Access: Stairs to enter [employer] of Steps: 1 Entrance Stairs-Rails: None Home Layout: One level     Bathroom Shower/Tub: Occupational psychologist: Handicapped height Bathroom Accessibility: Yes How Accessible: Accessible via walker Home Equipment: Bedside commode;Shower seat   Additional Comments: patients daughter assists during the week as needed and can provide assist on weekend as well per report this AM      Prior Functioning/Environment Level of Independence: Independent                 OT Problem List: Decreased strength;Decreased activity tolerance;Impaired balance (sitting and/or standing);Decreased safety awareness;Decreased knowledge of use of DME or AE      OT Treatment/Interventions: Self-care/ADL training;Patient/family education;Therapeutic activities;DME and/or AE instruction;Balance training    OT Goals(Current goals can be found in the care plan section) Acute Rehab OT Goals Patient Stated Goal: to get pain and nausea under control OT Goal Formulation: With patient Time For Goal Achievement: 11/09/20 Potential to Achieve Goals: Good  OT Frequency:     Barriers to D/C:    patient lives at home alone with PRN support from daughter.       Co-evaluation              AM-PAC OT "6 Clicks" Daily Activity     Outcome Measure Help from another person eating meals?: None Help from another person taking care of personal grooming?: A Little Help from another person toileting, which includes using toliet, bedpan, or urinal?:  A Lot Help from another person bathing (including washing, rinsing, drying)?: A Lot Help from another person to put on and taking off regular upper body clothing?: A Little Help from another person to put on and taking off regular lower body clothing?: A Lot 6 Click Score: 16   End of Session Equipment Utilized During Treatment: Rolling walker Nurse Communication: Mobility status  Activity Tolerance: Patient limited by fatigue;Patient limited by pain Patient left: in bed;with call bell/phone within reach;with family/visitor present  OT Visit Diagnosis: Unsteadiness on feet (R26.81);Pain;Muscle weakness (generalized) (M62.81) Pain - part of body:  (back)                Time: 7048-8891 OT Time Calculation (min): 23 min Charges:  OT General Charges $OT Visit: 1 Visit OT Evaluation $OT Eval Moderate Complexity: 1 Mod OT Treatments $Self Care/Home Management : 8-22 mins  Jackelyn Poling OTR/L, MS Acute Rehabilitation Department Office# [phone] Pager# [phone]   [place] 10/26/2020, 9:18 AM

## 2020-10-27 DIAGNOSIS — Z7189 Other specified counseling: Secondary | ICD-10-CM | POA: Diagnosis not present

## 2020-10-27 DIAGNOSIS — N1330 Unspecified hydronephrosis: Secondary | ICD-10-CM | POA: Diagnosis not present

## 2020-10-27 DIAGNOSIS — N390 Urinary tract infection, site not specified: Secondary | ICD-10-CM

## 2020-10-27 DIAGNOSIS — R531 Weakness: Secondary | ICD-10-CM | POA: Diagnosis not present

## 2020-10-27 DIAGNOSIS — A419 Sepsis, unspecified organism: Secondary | ICD-10-CM | POA: Diagnosis not present

## 2020-10-27 DIAGNOSIS — Z515 Encounter for palliative care: Secondary | ICD-10-CM | POA: Diagnosis not present

## 2020-10-27 LAB — GLUCOSE, CAPILLARY
Glucose-Capillary: 119 mg/dL — ABNORMAL HIGH (ref 70–99)
Glucose-Capillary: 158 mg/dL — ABNORMAL HIGH (ref 70–99)

## 2020-10-27 MED ORDER — CEFDINIR 300 MG PO CAPS: 600.0000 mg | ORAL_CAPSULE | Freq: Every day | ORAL | 0 refills | Status: AC

## 2020-10-27 NOTE — TOC Transition Note (Signed)
Transition of Care [facility]) - CM/SW Discharge Note   Patient Details  Name: Joseph [name] MRN: [id number] Date of Birth: 06/14/1931  Transition of Care [facility]) CM/SW Contact:  Dessa Phi, RN Phone Number: 10/27/2020, 10:48 AM   Clinical Narrative: See prior CM note. D/c home. No CM needs.      Final next level of care: Home/Self Care Barriers to Discharge: No Barriers Identified   Patient Goals and CMS Choice Patient states their goals for this hospitalization and ongoing recovery are:: go home CMS Medicare.gov Compare Post Acute Care list provided to:: Patient Represenative (must comment) [name] [phone]) Choice offered to / list presented to : Adult Children  Discharge Placement                       Discharge Plan and Services   Discharge Planning Services: CM Consult Post Acute Care Choice: Home Health                    HH Arranged: PT, OT [facility] Agency: [place] Date [facility] Agency Contacted: 10/26/20 Time [place]: 5789 Representative spoke with at [place]: [place] Determinants of Health ([place]) Interventions     Readmission Risk Interventions No flowsheet data found.

## 2020-10-27 NOTE — Progress Notes (Signed)
Daily Progress Note   Patient Name: Joseph [name]       Date: 10/27/2020 DOB: [birth year]  Age: 87 y.o. MRN#: [id number] Attending Physician: Dwyane Dee, MD Primary Care Physician: Gaynelle Arabian, MD Admit Date: 10/24/2020  Reason for Consultation/Follow-up: Establishing goals of care  Subjective: Awake alert sitting up in bed, feels better today.  Nausea has resolved.  Did have a bowel movement yesterday.  Back pain is better.  Overall feels better than yesterday.  Length of Stay: 3  Current Medications: Scheduled Meds:   COVID-19 mRNA bivalent vaccine (Pfizer)  0.3 mL Intramuscular ONCE-1600   enoxaparin (LOVENOX) injection  30 mg Subcutaneous Q24H   influenza vaccine adjuvanted  0.5 mL Intramuscular Tomorrow-1000   insulin aspart  0-9 Units Subcutaneous TID WC   metoprolol succinate  100 mg Oral Q breakfast   pantoprazole  40 mg Oral QAC breakfast   polyethylene glycol  17 g Oral Daily   senna-docusate  2 tablet Oral BID    Continuous Infusions:  cefTRIAXone (ROCEPHIN)  IV Stopped (10/26/20 1322)   lactated ringers 100 mL/hr at 10/27/20 0600    PRN Meds: acetaminophen **OR** acetaminophen, fentaNYL (SUBLIMAZE) injection, ipratropium, loratadine, ondansetron **OR** ondansetron (ZOFRAN) IV, sodium phosphate  Physical Exam         Awake alert oriented resting in bed Regular work of breathing S1-S2 No edema No focal deficits Abdomen is nontender  Vital Signs: BP (!) 150/104 (BP Location: Right Arm)   Pulse (!) 106   Temp 99.3 F (37.4 C) (Oral)   Resp 16   Ht 6\' 1"  (1.854 m)   Wt 84.6 kg   SpO2 98%   BMI 24.61 kg/m  SpO2: SpO2: 98 % O2 Device: O2 Device: Room Air O2 Flow Rate: O2 Flow Rate (L/min): 2 L/min  Intake/output summary:  Intake/Output Summary  (Last 24 hours) at 10/27/2020 1029 Last data filed at 10/27/2020 1016 Gross per 24 hour  Intake 2573.08 ml  Output 2650 ml  Net -76.92 ml   LBM: Last BM Date: 10/19/20 Baseline Weight: Weight: 82.6 kg Most recent weight: Weight: 84.6 kg       Palliative Assessment/Data:      Patient Active Problem List   Diagnosis Date Noted   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   Vertigo    Essential hypertension  Iron deficiency anemia    Fracture, proximal femur ([place]) 03/19/2014   Painful orthopaedic hardware ([place]) 01/13/2013   Fall at home 01/18/2012   Hip fracture, left ([place]) 01/18/2012   Femur fracture, left ([facility]) 01/18/2012   ESOPHAGEAL MOTILITY DISORDER 11/16/2009   PERSONAL HX PROSTATE CANCER 11/16/2009   DIVERTICULITIS-COLON 08/30/2009   CONSTIPATION 08/30/2009   HYPERTENSION 07/28/2009   GERD 07/28/2009   DIVERTICULOSIS-COLON 07/28/2009   ABDOMINAL PAIN, LEFT UPPER QUADRANT 07/28/2009   ABDOMINAL PAIN, LEFT LOWER QUADRANT 07/28/2009   PERSONAL HX COLONIC POLYPS 07/28/2009   ACHALASIA 03/09/2009   RECTAL BLEEDING 03/09/2009   NONSPECIFIC ABN FINDING RAD & OTH EXAM GI TRACT 02/25/2009   DYSPHAGIA UNSPECIFIED 02/24/2009    Palliative Care Assessment & Plan   Patient Profile:  87 year old gentleman who lives by himself, daughter is with him throughout the day, was independent with all activities of daily living up until most recent hospitalization.  He has past medical history of hypertension diet-controlled diabetes GERD history of hiatal hernia and history of non-small cell lung cancer on the left lung.  Was diagnosed with primary adenocarcinoma and underwent left-sided thoracotomy as well as lobectomy.  He completed all of his cancer treatments and is currently enrolled in cancer survivorship clinic at [facility].  Most recently he had an appointment in June 2022 where scans were negative for any kind of recurrence.  Patient's baseline is such that he is able to mow his yard,  he does not need a cane or walker and is independent with activities of daily living.  However, patient has been having fevers chills nausea vomiting abdominal pain as well as left-sided flank pain since the past several days for which he sought treatment in the emergency department.  CT scan of the abdomen and pelvis revealed 2 adjacent 5 mm stones in his right ureter causing dilation as well as moderate right-sided hydronephrosis.  Hospital course complicated by acute kidney injury, admitted to hospital medicine service with urology colleagues following and the patient has undergone cystoscopy and double-J stent placement on the right side.  Assessment: Nausea vomiting related to acute illness: Obstructive uropathy on the right, status post J stent placement, on antibiotics. Right-sided hydronephrosis with acute kidney injury Back pain likely secondary to above-mentioned infections. Constipation   Recommendations/Plan: Medication history reviewed.  Discussed with patient and daughter present at bedside.  Brief trial of scheduled Tylenol and scheduled antiemetics has been completed with relief of symptoms.  Continue as needed symptom management medications.  Patient wishes to go home towards the end of this hospitalization.  No further palliative specific recommendations at this time.  Goals of Care and Additional Recommendations: Limitations on Scope of Treatment: Full Scope Treatment  Code Status:    Code Status Orders  (From admission, onward)           Start     Ordered   10/24/20 1834  Full code  Continuous        10/24/20 1833           Code Status History     Date Active Date Inactive Code Status Order ID Comments User Context   03/19/2014 2002 03/23/2014 1659 Full Code [id number]  Gearlean Alf, MD Inpatient   01/13/2013 1847 01/14/2013 1409 Full Code [id number]  Gearlean Alf, MD Inpatient   01/18/2012 0153 01/22/2012 1905 Full Code [id number]  Ronnette Hila,  RN Inpatient      Advance Directive Documentation    Flowsheet Row Most Recent  Value  Type of Advance Directive Healthcare Power of Attorney, Living will  Pre-existing out of facility DNR order (yellow form or pink MOST form) --  "MOST" Form in Place? --       Prognosis:  Unable to determine  Discharge Planning: Home with [place] was discussed with patient and daughter  Thank you for allowing the Palliative Medicine Team to assist in the care of this patient.   Time In:  9 Time Out: 9.25 Total Time 25 Prolonged Time Billed  no       Greater than 50%  of this time was spent counseling and coordinating care related to the above assessment and plan.  Loistine Chance, MD  Please contact Palliative Medicine Team phone at [phone] for questions and concerns.

## 2020-10-27 NOTE — Plan of Care (Signed)
  Problem: Health Behavior/Discharge Planning: Goal: Ability to manage health-related needs will improve Outcome: Progressing   Problem: Activity: Goal: Risk for activity intolerance will decrease Outcome: Progressing   Problem: Nutrition: Goal: Adequate nutrition will be maintained Outcome: Progressing   Problem: Elimination: Goal: Will not experience complications related to bowel motility Outcome: Progressing Goal: Will not experience complications related to urinary retention Outcome: Progressing   Problem: Pain Managment: Goal: General experience of comfort will improve Outcome: Progressing

## 2020-10-27 NOTE — Plan of Care (Signed)

## 2020-10-27 NOTE — Discharge Summary (Signed)
Physician Discharge Summary   Patient name: Joseph [name]  Admit date:     10/24/2020  Discharge date: 10/27/2020  Attending Physician: Nicoletta Dress [place]  Discharge Physician: Dwyane Dee   PCP: Gaynelle Arabian, MD    Recommendations at discharge: Follow up with urology   Discharge Diagnoses Active Problems:   GERD   Essential hypertension   Hydroureteronephrosis   Resolved Diagnoses Principal Problem (Resolved):   Sepsis ([place]) Resolved Problems:   Acute lower UTI   Hospital Course    [place] a 87 y.o.malewith medical history significant ofhypertension, diet-controlled diabetes mellitus, GERD, history of hiatal hernia, non-small cell lung cancer on the left s/p thoracotomy and lobectomy by Dr. Roxan Hockey in 2019, started having fevers and chills associated with nausea vomiting and abdominal pain, left-sided flank pain since Thursday. CT scan of the abdomen and pelvis without contrast shows 2 adjacent 5 mm stones in the right ureter causing dilatation of the ureter and moderate right hydronephrosis.  Urology consulted and he underwent cystoscopy and double-J stent in the right ureter. He was also found to be in AK which improved with IVF.   Sepsis with obstructive uropathy on the right secondary to urinary tract infection -Leukocytosis, lactic acidosis, urinary source -Treated with Rocephin during hospitalization.  No growth on cultures - Transitioned to [facility] to complete course at discharge - Outpatient follow-up with urology Patient underwent cystoscopy and double-J stent placed on the right ureter.  Right sided hydronephrosis with AKI secondary to obstructing stones S/p cystoscopy and double-J stent placementon the right  Diet controlled diabetes mellitus  Hypertension Blood pressure parameters are optimal.   GERD History of hiatal hernia Continue with Protonix.  Acute thrombocytopenia - considered due to acute  illness/infection Improved  Non-small cell lung cancer left upper lobe S/p thoracotomy, lobectomyby Dr Roxan Hockey in 2019. Outpatient follow up.  Constipation/ nausea, abd distention. -continue with senna, Miralax .    Procedures performed: cystoscopy and double-J stent placementon the right  Condition at discharge: stable  Exam General appearance: alert, cooperative and no distress Head: Normocephalic, without obvious abnormality, atraumatic Eyes: EOMI Lungs: clear to auscultation bilaterally Heart: regular rate and rhythm and S1, S2 normal Abdomen: normal findings: bowel sounds normal Extremities: no edema Skin: mobility and turgor normal Neurologic: Grossly normal   Disposition: Home  Discharge time: greater than 30 minutes. Allergies as of 10/27/2020   No Known Allergies     Medication List    STOP taking these medications   HYDROcodone-homatropine 5-1.5 MG/5ML syrup Commonly known as: HYCODAN   levofloxacin 750 MG tablet Commonly known as: LEVAQUIN   meclizine 25 MG tablet Commonly known as: ANTIVERT     TAKE these medications   acetaminophen 500 MG tablet Commonly known as: TYLENOL Take 500-1,000 mg by mouth every 8 (eight) hours as needed for moderate pain or headache.   amoxicillin 500 MG capsule Commonly known as: AMOXIL Take 2,000 mg by mouth See admin instructions. Take 4 capsules (2000 mg) by mouth 1 hours prior to procedure.   aspirin EC 81 MG tablet Take 81 mg by mouth as directed. MWF   cefdinir 300 MG capsule Commonly known as: OMNICEF Take 2 capsules (600 mg total) by mouth daily for 7 days.   Elderberry Immune Complex Chew Chew 1 tablet by mouth daily.   ibuprofen 200 MG tablet Commonly known as: ADVIL Take 400-600 mg by mouth every 8 (eight) hours as needed (for pain.).   ipratropium 0.06 % nasal spray Commonly known  as: ATROVENT Place 1 spray into both nostrils 3 (three) times daily as needed (allergies).    lisinopril 20 MG tablet Commonly known as: ZESTRIL Take 20 mg by mouth daily.   loratadine 10 MG tablet Commonly known as: CLARITIN Take 10 mg by mouth daily.   metoprolol succinate 100 MG 24 hr tablet Commonly known as: TOPROL-XL Take 100 mg by mouth daily with breakfast.   Multivitamin Adults Tabs Take 1 tablet by mouth daily.   ondansetron 4 MG tablet Commonly known as: ZOFRAN Take 4 mg by mouth every 8 (eight) hours as needed for nausea or vomiting.   pantoprazole 40 MG tablet Commonly known as: PROTONIX Take 40 mg by mouth daily before breakfast.   VITAMIN C PO Take 1 packet by mouth daily.       CT ABDOMEN PELVIS WO CONTRAST  Result Date: 10/24/2020 CLINICAL DATA:  Abdominal pain.  Fever.  Possible pyelonephritis. EXAM: CT ABDOMEN AND PELVIS WITHOUT CONTRAST TECHNIQUE: Multidetector CT imaging of the abdomen and pelvis was performed following the standard protocol without IV contrast. COMPARISON:  12/02/2012. FINDINGS: Lower chest: No acute abnormality. Hepatobiliary: No focal liver abnormality is seen. No gallstones, gallbladder wall thickening, or biliary dilatation. Pancreas: No mass or inflammation. Spleen: Normal in size without focal abnormality. Adrenals/Urinary Tract: No adrenal masses. Two adjacent 5 mm stones in the right ureter, just below the pelvic brim, causing mild ureteral dilation and moderate right hydronephrosis. No left hydronephrosis. 7 mm nonobstructing stone, lower pole the right kidney. No other intrarenal stones. Low-attenuation renal masses, largest on the right, 8.6 cm, upper pole, largest on the left, 8.7 cm, lower pole, consistent with cysts. Additional right midpole renal mass, protruding exophytically from the cortex, hyperattenuating, average Hounsfield units of 62, measuring 2 cm in size, consistent with a hemorrhagic cyst. Normal left ureter.  Bladder is unremarkable. Stomach/Bowel: Small hiatal hernia. Stomach otherwise unremarkable. Small  bowel and colon are normal in caliber. No wall thickening. No inflammation. Pan colonic diverticulosis. Vascular/Lymphatic: Aortic atherosclerosis. No enlarged lymph nodes. Reproductive: Prostate normal in size. Other: No ascites. Musculoskeletal: Total left hip prosthesis, incompletely imaged, but appearing well seated and aligned. No fracture or acute finding. No bone lesion. IMPRESSION: 1. Two adjacent 5 mm stones in the mid to distal right ureter, just below the pelvic brim, causing mild dilation of the ureter above the stones and moderate right hydronephrosis. 2. No other acute abnormality within the abdomen or pelvis. 3. 7 mm nonobstructing stone in the lower pole the right kidney. 4. Bilateral renal masses consistent with cysts. A 2 cm right renal masses hyperattenuating consistent with a hemorrhagic cyst. 5. Aortic atherosclerosis.  Pan colonic diverticulosis. Electronically Signed   By: Lajean Manes M.D.   On: 10/24/2020 12:50   DG Chest Port 1 View  Result Date: 10/24/2020 CLINICAL DATA:  Thursday morning fever, N/V decreased urinary output. Went to PCP Thursday dx with UTI started on Levaquin. Pt has not been drinking and eating well, noted dizziness, fever, N/V. EXAM: PORTABLE CHEST 1 VIEW COMPARISON:  08/07/2018. FINDINGS: Cardiac silhouette normal in size.  No mediastinal or hilar masses. There is opacity at the left lung base obscuring the hemidiaphragm, similar to the prior study. Remainder of the lungs is clear. No evidence of a right pleural effusion. No pneumothorax. Skeletal structures are grossly intact. IMPRESSION: 1. No acute cardiopulmonary disease. 2. Chronic left lung base opacity consistent with changes from prior lung surgery. Electronically Signed   By: Dedra Skeens.D.  On: 10/24/2020 10:50   DG Abd 2 Views  Result Date: 10/26/2020 CLINICAL DATA:  Distension EXAM: ABDOMEN - 2 VIEW COMPARISON:  CT 10/24/2020 FINDINGS: Pleural and parenchymal disease at the left base.  Nonobstructed gas pattern with moderate to large amount of stool in the colon. Interim placement of right-sided ureteral stent, incompletely visualized. Right kidney stone IMPRESSION: 1. Nonobstructed gas pattern with moderate to large stool burden. 2. Right ureteral stent incompletely visualized.  Right kidney stone Electronically Signed   By: Donavan Foil M.D.   On: 10/26/2020 19:53   DG C-Arm 1-60 Min-No Report  Result Date: 10/24/2020 Fluoroscopy was utilized by the requesting physician.  No radiographic interpretation.   Results for orders placed or performed during the hospital encounter of 10/24/20  Culture, blood (Routine x 2)     Status: None (Preliminary result)   Collection Time: 10/24/20 10:30 AM   Specimen: BLOOD  Result Value Ref Range Status   Specimen Description   Final    BLOOD BLOOD RIGHT FOREARM Performed at [facility], [address], [place], [place] [address]    Special Requests   Final    BOTTLES DRAWN AEROBIC AND ANAEROBIC Blood Culture adequate volume Performed at [facility], [address] [place], [place] [address]    Culture   Final    NO GROWTH 3 DAYS Performed at [facility], [place] [address], [place], [place] [address]    Report Status PENDING  Incomplete  Culture, blood (Routine x 2)     Status: None (Preliminary result)   Collection Time: 10/24/20 11:50 AM   Specimen: BLOOD  Result Value Ref Range Status   Specimen Description   Final    BLOOD LEFT ANTECUBITAL Performed at [facility], [address] [place], [place] [address]    Special Requests   Final    BOTTLES DRAWN AEROBIC AND ANAEROBIC Blood Culture adequate volume Performed at [facility], [address], [place], [place] [address]    Culture   Final    NO GROWTH 3 DAYS Performed at [facility], [place] [address] [place], [place] [address]    Report Status PENDING  Incomplete  Resp Panel  by RT-PCR (Flu A&B, Covid) Nasopharyngeal Swab     Status: None   Collection Time: 10/24/20 11:57 AM   Specimen: Nasopharyngeal Swab; Nasopharyngeal(NP) swabs in vial transport medium  Result Value Ref Range Status   SARS Coronavirus 2 by RT PCR NEGATIVE NEGATIVE Final    Comment: (NOTE) SARS-CoV-2 target nucleic acids are NOT DETECTED.  The SARS-CoV-2 RNA is generally detectable in upper respiratory specimens during the acute phase of infection. The lowest concentration of SARS-CoV-2 viral copies this assay can detect is 138 copies/mL. A negative result does not preclude SARS-Cov-2 infection and should not be used as the sole basis for treatment or other patient management decisions. A negative result may occur with  improper specimen collection/handling, submission of specimen other than nasopharyngeal swab, presence of viral mutation(s) within the areas targeted by this assay, and inadequate number of viral copies(<138 copies/mL). A negative result must be combined with clinical observations, patient history, and epidemiological information. The expected result is Negative.  Fact Sheet for Patients:  EntrepreneurPulse.com.au  Fact Sheet for Healthcare Providers:  IncredibleEmployment.be  This test is no t yet approved or cleared by the [place] FDA and  has been authorized for detection and/or diagnosis of SARS-CoV-2 by FDA under an Emergency Use Authorization (EUA).  This EUA will remain  in effect (meaning this test can be used) for the duration of the COVID-19 declaration under Section 564(b)(1) of the Act, 21 U.S.C.section 360bbb-3(b)(1), unless the authorization is terminated  or revoked sooner.       Influenza A by PCR NEGATIVE NEGATIVE Final   Influenza B by PCR NEGATIVE NEGATIVE Final    Comment: (NOTE) The Xpert Xpress SARS-CoV-2/FLU/RSV plus assay is intended as an aid in the diagnosis of influenza from Nasopharyngeal swab  specimens and should not be used as a sole basis for treatment. Nasal washings and aspirates are unacceptable for Xpert Xpress SARS-CoV-2/FLU/RSV testing.  Fact Sheet for Patients: EntrepreneurPulse.com.au  Fact Sheet for Healthcare Providers: IncredibleEmployment.be  This test is not yet approved or cleared by the [place] FDA and has been authorized for detection and/or diagnosis of SARS-CoV-2 by FDA under an Emergency Use Authorization (EUA). This EUA will remain in effect (meaning this test can be used) for the duration of the COVID-19 declaration under Section 564(b)(1) of the Act, 21 U.S.C. section 360bbb-3(b)(1), unless the authorization is terminated or revoked.  Performed at [facility], [address], [place], [place] [address]   Urine Culture     Status: None   Collection Time: 10/25/20  4:10 PM   Specimen: Urine, Clean Catch  Result Value Ref Range Status   Specimen Description   Final    URINE, CLEAN CATCH Performed at [facility], [place] [address], [place], [place] [address]    Special Requests   Final    NONE Performed at [facility], [place] [address] [place], [place] [address]    Culture   Final    NO GROWTH Performed at [facility], [place] [address], [place], [place] [address]    Report Status 10/26/2020 FINAL  Final    Signed:  Dwyane Dee MD.  [facility] 10/27/2020, 2:52 PM

## 2020-10-29 LAB — CULTURE, BLOOD (ROUTINE X 2)
Culture: NO GROWTH
Culture: NO GROWTH
Special Requests: ADEQUATE
Special Requests: ADEQUATE

## 2020-11-11 ENCOUNTER — Other Ambulatory Visit: Payer: Self-pay | Admitting: Urology

## 2020-11-11 DIAGNOSIS — N201 Calculus of ureter: Secondary | ICD-10-CM | POA: Diagnosis not present

## 2020-11-11 DIAGNOSIS — N202 Calculus of kidney with calculus of ureter: Secondary | ICD-10-CM | POA: Diagnosis not present

## 2020-11-11 DIAGNOSIS — N200 Calculus of kidney: Secondary | ICD-10-CM | POA: Diagnosis not present

## 2020-11-11 DIAGNOSIS — Q638 Other specified congenital malformations of kidney: Secondary | ICD-10-CM | POA: Diagnosis not present

## 2020-11-11 DIAGNOSIS — N1339 Other hydronephrosis: Secondary | ICD-10-CM | POA: Diagnosis not present

## 2020-12-20 DIAGNOSIS — N202 Calculus of kidney with calculus of ureter: Secondary | ICD-10-CM | POA: Diagnosis not present

## 2020-12-20 DIAGNOSIS — N281 Cyst of kidney, acquired: Secondary | ICD-10-CM | POA: Diagnosis not present

## 2021-01-05 DIAGNOSIS — Z961 Presence of intraocular lens: Secondary | ICD-10-CM | POA: Diagnosis not present

## 2021-01-05 DIAGNOSIS — H40013 Open angle with borderline findings, low risk, bilateral: Secondary | ICD-10-CM | POA: Diagnosis not present

## 2021-01-05 DIAGNOSIS — R7309 Other abnormal glucose: Secondary | ICD-10-CM | POA: Diagnosis not present

## 2021-02-02 DIAGNOSIS — E1169 Type 2 diabetes mellitus with other specified complication: Secondary | ICD-10-CM | POA: Diagnosis not present

## 2021-02-02 DIAGNOSIS — Z0000 Encounter for general adult medical examination without abnormal findings: Secondary | ICD-10-CM | POA: Diagnosis not present

## 2021-02-02 DIAGNOSIS — I10 Essential (primary) hypertension: Secondary | ICD-10-CM | POA: Diagnosis not present

## 2021-02-02 DIAGNOSIS — Z1389 Encounter for screening for other disorder: Secondary | ICD-10-CM | POA: Diagnosis not present

## 2021-02-02 DIAGNOSIS — D692 Other nonthrombocytopenic purpura: Secondary | ICD-10-CM | POA: Diagnosis not present

## 2021-02-02 DIAGNOSIS — E7800 Pure hypercholesterolemia, unspecified: Secondary | ICD-10-CM | POA: Diagnosis not present

## 2021-02-02 DIAGNOSIS — J310 Chronic rhinitis: Secondary | ICD-10-CM | POA: Diagnosis not present

## 2021-02-02 DIAGNOSIS — Z85118 Personal history of other malignant neoplasm of bronchus and lung: Secondary | ICD-10-CM | POA: Diagnosis not present

## 2021-02-02 DIAGNOSIS — I700 Atherosclerosis of aorta: Secondary | ICD-10-CM | POA: Diagnosis not present

## 2021-02-02 DIAGNOSIS — K219 Gastro-esophageal reflux disease without esophagitis: Secondary | ICD-10-CM | POA: Diagnosis not present

## 2021-04-25 DIAGNOSIS — N281 Cyst of kidney, acquired: Secondary | ICD-10-CM | POA: Diagnosis not present

## 2021-07-05 DIAGNOSIS — I10 Essential (primary) hypertension: Secondary | ICD-10-CM | POA: Diagnosis not present

## 2021-07-15 DIAGNOSIS — Z902 Acquired absence of lung [part of]: Secondary | ICD-10-CM | POA: Diagnosis not present

## 2021-07-15 DIAGNOSIS — Z08 Encounter for follow-up examination after completed treatment for malignant neoplasm: Secondary | ICD-10-CM | POA: Diagnosis not present

## 2021-07-15 DIAGNOSIS — Z85118 Personal history of other malignant neoplasm of bronchus and lung: Secondary | ICD-10-CM | POA: Diagnosis not present

## 2021-07-15 DIAGNOSIS — Z9889 Other specified postprocedural states: Secondary | ICD-10-CM | POA: Diagnosis not present

## 2021-07-15 DIAGNOSIS — C3412 Malignant neoplasm of upper lobe, left bronchus or lung: Secondary | ICD-10-CM | POA: Diagnosis not present

## 2021-08-08 DIAGNOSIS — E1169 Type 2 diabetes mellitus with other specified complication: Secondary | ICD-10-CM | POA: Diagnosis not present

## 2021-08-08 DIAGNOSIS — I700 Atherosclerosis of aorta: Secondary | ICD-10-CM | POA: Diagnosis not present

## 2021-08-08 DIAGNOSIS — I10 Essential (primary) hypertension: Secondary | ICD-10-CM | POA: Diagnosis not present

## 2021-08-08 DIAGNOSIS — J310 Chronic rhinitis: Secondary | ICD-10-CM | POA: Diagnosis not present

## 2021-08-08 DIAGNOSIS — E7800 Pure hypercholesterolemia, unspecified: Secondary | ICD-10-CM | POA: Diagnosis not present

## 2021-08-08 DIAGNOSIS — D692 Other nonthrombocytopenic purpura: Secondary | ICD-10-CM | POA: Diagnosis not present

## 2021-08-08 DIAGNOSIS — Z85118 Personal history of other malignant neoplasm of bronchus and lung: Secondary | ICD-10-CM | POA: Diagnosis not present

## 2021-08-08 DIAGNOSIS — N1831 Chronic kidney disease, stage 3a: Secondary | ICD-10-CM | POA: Diagnosis not present

## 2021-08-08 DIAGNOSIS — K219 Gastro-esophageal reflux disease without esophagitis: Secondary | ICD-10-CM | POA: Diagnosis not present

## 2022-01-11 DIAGNOSIS — R7309 Other abnormal glucose: Secondary | ICD-10-CM | POA: Diagnosis not present

## 2022-01-11 DIAGNOSIS — H40013 Open angle with borderline findings, low risk, bilateral: Secondary | ICD-10-CM | POA: Diagnosis not present

## 2022-01-11 DIAGNOSIS — Z961 Presence of intraocular lens: Secondary | ICD-10-CM | POA: Diagnosis not present

## 2022-01-27 ENCOUNTER — Telehealth: Payer: Self-pay

## 2022-01-27 NOTE — Patient Instructions (Signed)
Visit Information  Thank you for taking time to visit with me today. Please don't hesitate to contact me if I can be of assistance to you.   Following are the goals we discussed today:   Goals Addressed             This Visit's Progress    COMPLETED: Care Coordination Activities - no follow up required       Care Coordination Interventions: Provided education to patient re: care coordination services, annual wellness visit Assessed social determinant of health barriers         If you are experiencing a Mental Health or [place] or need someone to talk to, please call the Suicide and Crisis Lifeline: 988 call the [place] National Suicide Prevention Lifeline: (713)286-1452 or TTY: (737) 456-1745 TTY (305)648-5091) to talk to a trained counselor call 1-800-273-TALK (toll free, 24 hour hotline) call 911   Patient verbalizes understanding of instructions and care plan provided today and agrees to view in [place]. Active MyChart status and patient understanding of how to access instructions and care plan via MyChart confirmed with patient.     No further follow up required:    Peter Garter RN, Jackquline [place], [place] Management [phone]

## 2022-01-27 NOTE — Patient Outreach (Signed)
  Care Coordination   Initial Visit Note   01/27/2022 Name: RYANE CANAVAN MRN: [id number] DOB: [birth year]  Tishawn L Matuszak is a 88 y.o. year old male who sees Gaynelle Arabian, MD for primary care. I  spoke with daughter [name] DPR  What matters to the patients health and wellness today?  No concerns today.  Daughter states she helps pt with refilling  medications  and she assists him as needed but he is doing good now.   Goals Addressed             This Visit's Progress    COMPLETED: Care Coordination Activities - no follow up required       Care Coordination Interventions: Provided education to patient re: care coordination services, annual wellness visit Assessed social determinant of health barriers          SDOH assessments and interventions completed:  Yes  SDOH Interventions Today    Flowsheet Row Most Recent Value  SDOH Interventions   Food Insecurity Interventions Intervention Not Indicated  Housing Interventions Intervention Not Indicated  Transportation Interventions Intervention Not Indicated  Utilities Interventions Intervention Not Indicated        Care Coordination Interventions:  Yes, provided   Follow up plan: No further intervention required.   Encounter Outcome:  Pt. Visit Completed  Peter Garter RN, BSN,CCM, CDE Care Management Coordinator [place] Management [phone]

## 2022-02-14 DIAGNOSIS — B078 Other viral warts: Secondary | ICD-10-CM | POA: Diagnosis not present

## 2022-03-01 DIAGNOSIS — K219 Gastro-esophageal reflux disease without esophagitis: Secondary | ICD-10-CM | POA: Diagnosis not present

## 2022-03-01 DIAGNOSIS — J310 Chronic rhinitis: Secondary | ICD-10-CM | POA: Diagnosis not present

## 2022-03-01 DIAGNOSIS — Z0000 Encounter for general adult medical examination without abnormal findings: Secondary | ICD-10-CM | POA: Diagnosis not present

## 2022-03-01 DIAGNOSIS — E7800 Pure hypercholesterolemia, unspecified: Secondary | ICD-10-CM | POA: Diagnosis not present

## 2022-03-01 DIAGNOSIS — Z1331 Encounter for screening for depression: Secondary | ICD-10-CM | POA: Diagnosis not present

## 2022-03-01 DIAGNOSIS — E1169 Type 2 diabetes mellitus with other specified complication: Secondary | ICD-10-CM | POA: Diagnosis not present

## 2022-03-01 DIAGNOSIS — I700 Atherosclerosis of aorta: Secondary | ICD-10-CM | POA: Diagnosis not present

## 2022-03-01 DIAGNOSIS — N1831 Chronic kidney disease, stage 3a: Secondary | ICD-10-CM | POA: Diagnosis not present

## 2022-03-01 DIAGNOSIS — D692 Other nonthrombocytopenic purpura: Secondary | ICD-10-CM | POA: Diagnosis not present

## 2022-03-01 DIAGNOSIS — I10 Essential (primary) hypertension: Secondary | ICD-10-CM | POA: Diagnosis not present

## 2022-03-01 DIAGNOSIS — Z85118 Personal history of other malignant neoplasm of bronchus and lung: Secondary | ICD-10-CM | POA: Diagnosis not present

## 2022-07-21 DIAGNOSIS — Z902 Acquired absence of lung [part of]: Secondary | ICD-10-CM | POA: Diagnosis not present

## 2022-07-21 DIAGNOSIS — C3412 Malignant neoplasm of upper lobe, left bronchus or lung: Secondary | ICD-10-CM | POA: Diagnosis not present

## 2022-08-14 DIAGNOSIS — E1122 Type 2 diabetes mellitus with diabetic chronic kidney disease: Secondary | ICD-10-CM | POA: Diagnosis not present

## 2022-08-14 DIAGNOSIS — I10 Essential (primary) hypertension: Secondary | ICD-10-CM | POA: Diagnosis not present

## 2022-08-14 DIAGNOSIS — E7800 Pure hypercholesterolemia, unspecified: Secondary | ICD-10-CM | POA: Diagnosis not present

## 2022-08-14 DIAGNOSIS — I700 Atherosclerosis of aorta: Secondary | ICD-10-CM | POA: Diagnosis not present

## 2022-08-14 DIAGNOSIS — N1831 Chronic kidney disease, stage 3a: Secondary | ICD-10-CM | POA: Diagnosis not present

## 2022-08-14 DIAGNOSIS — Z85118 Personal history of other malignant neoplasm of bronchus and lung: Secondary | ICD-10-CM | POA: Diagnosis not present

## 2022-08-14 DIAGNOSIS — E1169 Type 2 diabetes mellitus with other specified complication: Secondary | ICD-10-CM | POA: Diagnosis not present

## 2022-08-14 DIAGNOSIS — J310 Chronic rhinitis: Secondary | ICD-10-CM | POA: Diagnosis not present

## 2022-08-14 DIAGNOSIS — K219 Gastro-esophageal reflux disease without esophagitis: Secondary | ICD-10-CM | POA: Diagnosis not present

## 2022-09-08 DIAGNOSIS — N411 Chronic prostatitis: Secondary | ICD-10-CM | POA: Diagnosis not present

## 2022-09-11 ENCOUNTER — Ambulatory Visit
Admission: RE | Admit: 2022-09-11 | Discharge: 2022-09-11 | Disposition: A | Discharge status: Home / Self Care | Payer: PPO | Source: Ambulatory Visit | Attending: Family Medicine | Admitting: Family Medicine

## 2022-09-11 ENCOUNTER — Other Ambulatory Visit: Payer: Self-pay | Admitting: Family Medicine

## 2022-09-11 DIAGNOSIS — H532 Diplopia: Secondary | ICD-10-CM

## 2022-09-11 DIAGNOSIS — H02402 Unspecified ptosis of left eyelid: Secondary | ICD-10-CM

## 2022-09-11 MED ORDER — IOPAMIDOL (ISOVUE-300) INJECTION 61%
100.0000 mL | Freq: Once | INTRAVENOUS | Status: AC | PRN
  Administered 2022-09-11: 75 mL via INTRAVENOUS

## 2022-09-12 ENCOUNTER — Other Ambulatory Visit: Payer: Self-pay

## 2022-09-12 ENCOUNTER — Emergency Department (HOSPITAL_COMMUNITY): Payer: PPO

## 2022-09-12 ENCOUNTER — Encounter (HOSPITAL_COMMUNITY): Payer: Self-pay

## 2022-09-12 ENCOUNTER — Observation Stay (HOSPITAL_COMMUNITY)
Admission: EM | Admit: 2022-09-12 | Discharge: 2022-09-15 | Disposition: A | Discharge status: Home / Self Care | Payer: PPO | Attending: Student | Admitting: Student

## 2022-09-12 DIAGNOSIS — Z96642 Presence of left artificial hip joint: Secondary | ICD-10-CM | POA: Diagnosis not present

## 2022-09-12 DIAGNOSIS — I3139 Other pericardial effusion (noninflammatory): Secondary | ICD-10-CM | POA: Diagnosis not present

## 2022-09-12 DIAGNOSIS — H532 Diplopia: Secondary | ICD-10-CM | POA: Diagnosis not present

## 2022-09-12 DIAGNOSIS — K224 Dyskinesia of esophagus: Secondary | ICD-10-CM | POA: Diagnosis present

## 2022-09-12 DIAGNOSIS — Z79899 Other long term (current) drug therapy: Secondary | ICD-10-CM | POA: Insufficient documentation

## 2022-09-12 DIAGNOSIS — I672 Cerebral atherosclerosis: Secondary | ICD-10-CM | POA: Insufficient documentation

## 2022-09-12 DIAGNOSIS — E119 Type 2 diabetes mellitus without complications: Secondary | ICD-10-CM

## 2022-09-12 DIAGNOSIS — N281 Cyst of kidney, acquired: Secondary | ICD-10-CM | POA: Diagnosis not present

## 2022-09-12 DIAGNOSIS — Z8546 Personal history of malignant neoplasm of prostate: Secondary | ICD-10-CM | POA: Diagnosis not present

## 2022-09-12 DIAGNOSIS — Z96653 Presence of artificial knee joint, bilateral: Secondary | ICD-10-CM | POA: Insufficient documentation

## 2022-09-12 DIAGNOSIS — I700 Atherosclerosis of aorta: Secondary | ICD-10-CM | POA: Diagnosis not present

## 2022-09-12 DIAGNOSIS — I6782 Cerebral ischemia: Secondary | ICD-10-CM | POA: Diagnosis not present

## 2022-09-12 DIAGNOSIS — Z7982 Long term (current) use of aspirin: Secondary | ICD-10-CM | POA: Diagnosis not present

## 2022-09-12 DIAGNOSIS — I2510 Atherosclerotic heart disease of native coronary artery without angina pectoris: Secondary | ICD-10-CM | POA: Diagnosis not present

## 2022-09-12 DIAGNOSIS — G7000 Myasthenia gravis without (acute) exacerbation: Secondary | ICD-10-CM

## 2022-09-12 DIAGNOSIS — Z85118 Personal history of other malignant neoplasm of bronchus and lung: Secondary | ICD-10-CM | POA: Diagnosis not present

## 2022-09-12 DIAGNOSIS — K220 Achalasia of cardia: Secondary | ICD-10-CM | POA: Diagnosis present

## 2022-09-12 DIAGNOSIS — H5000 Unspecified esotropia: Secondary | ICD-10-CM | POA: Diagnosis not present

## 2022-09-12 DIAGNOSIS — I10 Essential (primary) hypertension: Secondary | ICD-10-CM | POA: Diagnosis not present

## 2022-09-12 DIAGNOSIS — H02402 Unspecified ptosis of left eyelid: Secondary | ICD-10-CM | POA: Diagnosis not present

## 2022-09-12 DIAGNOSIS — R42 Dizziness and giddiness: Secondary | ICD-10-CM | POA: Diagnosis not present

## 2022-09-12 DIAGNOSIS — H02409 Unspecified ptosis of unspecified eyelid: Secondary | ICD-10-CM

## 2022-09-12 DIAGNOSIS — H5712 Ocular pain, left eye: Secondary | ICD-10-CM | POA: Diagnosis not present

## 2022-09-12 LAB — CBC
HCT: 46.8 % (ref 39.0–52.0)
Hemoglobin: 14.6 g/dL (ref 13.0–17.0)
MCH: 26.8 pg (ref 26.0–34.0)
MCHC: 31.2 g/dL (ref 30.0–36.0)
MCV: 85.9 fL (ref 80.0–100.0)
Platelets: 215 10*3/uL (ref 150–400)
RBC: 5.45 MIL/uL (ref 4.22–5.81)
RDW: 14.2 % (ref 11.5–15.5)
WBC: 6.8 10*3/uL (ref 4.0–10.5)
nRBC: 0 % (ref 0.0–0.2)

## 2022-09-12 LAB — BASIC METABOLIC PANEL
Anion gap: 9 (ref 5–15)
BUN: 24 mg/dL — ABNORMAL HIGH (ref 8–23)
CO2: 23 mmol/L (ref 22–32)
Calcium: 8.9 mg/dL (ref 8.9–10.3)
Chloride: 105 mmol/L (ref 98–111)
Creatinine, Ser: 1.41 mg/dL — ABNORMAL HIGH (ref 0.61–1.24)
GFR, Estimated: 48 mL/min — ABNORMAL LOW (ref >60)
Glucose, Bld: 122 mg/dL — ABNORMAL HIGH (ref 70–99)
Potassium: 4.7 mmol/L (ref 3.5–5.1)
Sodium: 137 mmol/L (ref 135–145)

## 2022-09-12 MED ORDER — ACETAMINOPHEN 325 MG PO TABS: 650.0000 mg | ORAL_TABLET | Freq: Four times a day (QID) | ORAL | Status: DC | PRN

## 2022-09-12 MED ORDER — ASPIRIN 81 MG PO CHEW
81.0000 mg | CHEWABLE_TABLET | Freq: Every day | ORAL | Status: DC
  Administered 2022-09-13 – 2022-09-15 (×3): 81 mg via ORAL
  Filled 2022-09-12 (×3): qty 1

## 2022-09-12 MED ORDER — GADOBUTROL 1 MMOL/ML IV SOLN
7.5000 mL | Freq: Once | INTRAVENOUS | Status: AC | PRN
  Administered 2022-09-12: 7.5 mL via INTRAVENOUS

## 2022-09-12 MED ORDER — TIZANIDINE HCL 4 MG PO TABS: 4.0000 mg | ORAL_TABLET | Freq: Three times a day (TID) | ORAL | Status: DC | PRN

## 2022-09-12 MED ORDER — SODIUM CHLORIDE 0.9% FLUSH
3.0000 mL | Freq: Two times a day (BID) | INTRAVENOUS | Status: DC
  Administered 2022-09-13 – 2022-09-15 (×5): 3 mL via INTRAVENOUS

## 2022-09-12 MED ORDER — POLYETHYLENE GLYCOL 3350 17 G PO PACK
17.0000 g | PACK | Freq: Every day | ORAL | Status: DC | PRN
  Filled 2022-09-12: qty 1

## 2022-09-12 MED ORDER — PANTOPRAZOLE SODIUM 20 MG PO TBEC
20.0000 mg | DELAYED_RELEASE_TABLET | Freq: Every day | ORAL | Status: DC
  Administered 2022-09-13 – 2022-09-14 (×2): 20 mg via ORAL
  Filled 2022-09-12 (×2): qty 1

## 2022-09-12 MED ORDER — METOPROLOL SUCCINATE ER 100 MG PO TB24
100.0000 mg | ORAL_TABLET | Freq: Every day | ORAL | Status: DC
  Administered 2022-09-13 – 2022-09-14 (×2): 100 mg via ORAL
  Filled 2022-09-12 (×2): qty 1

## 2022-09-12 MED ORDER — LISINOPRIL 20 MG PO TABS
20.0000 mg | ORAL_TABLET | Freq: Every day | ORAL | Status: DC
  Administered 2022-09-13: 20 mg via ORAL
  Filled 2022-09-12: qty 1

## 2022-09-12 MED ORDER — ACETAMINOPHEN 650 MG RE SUPP: 650.0000 mg | Freq: Four times a day (QID) | RECTAL | Status: DC | PRN

## 2022-09-12 MED ORDER — PYRIDOSTIGMINE BROMIDE 10 MG/2ML IV SOLN
2.0000 mg | Freq: Once | INTRAVENOUS | Status: AC
  Administered 2022-09-12: 2 mg via INTRAVENOUS
  Filled 2022-09-12: qty 0.4

## 2022-09-12 MED ORDER — PYRIDOSTIGMINE BROMIDE 60 MG PO TABS
30.0000 mg | ORAL_TABLET | Freq: Three times a day (TID) | ORAL | Status: DC
  Administered 2022-09-13 – 2022-09-15 (×7): 30 mg via ORAL
  Filled 2022-09-12 (×9): qty 0.5

## 2022-09-12 MED ORDER — ENOXAPARIN SODIUM 40 MG/0.4ML IJ SOSY
40.0000 mg | PREFILLED_SYRINGE | INTRAMUSCULAR | Status: DC
  Administered 2022-09-13 – 2022-09-15 (×3): 40 mg via SUBCUTANEOUS
  Filled 2022-09-12 (×3): qty 0.4

## 2022-09-12 NOTE — ED Triage Notes (Signed)
Pt came in via POV d/t a week ago on Monday pt had a vertigo spell & took his Meclizine & then Tues he felt ok, then [employer] Fri he said he did not feel good at all. Then Friday (4 days ago) he began having blurred & double vision then the next day on Sat he noticed drooping under his Lt eye. Pt went to see his PCP & an emergent CT was done & nothing of concern was seen. He then saw his eye dr today & was told he needed to come in for eval for an MRI to rule out stroke or Myasthenia Gravis. A/Ox4, denies pain or dizziness, just continues to have blurred/double vision.

## 2022-09-12 NOTE — ED Provider Notes (Signed)
[name] EMERGENCY DEPARTMENT AT [facility] Provider Note   CSN: [id number] Arrival date & time: 09/12/22  1636     History  Chief Complaint  Patient presents with   Stroke s/s    Joseph [name] is a 89 y.o. male with a past medical history significant for hypertension, diabetes, and history of prostate cancer who presents to the ED due to diplopia and ptosis.  Patient admits to bilateral diplopia x 4 days.  Then developed ptosis 3 days ago of the left eyelid.  Patient evaluated by PCP yesterday where a CT head was obtained which was negative for any acute abnormalities.  Patient then evaluated by ophthalmology today where he was told to report to the ED for MRI brain to rule out CVA versus myasthenia gravis.  Per daughter at bedside ophthalmologist noted abnormalities in muscles or nerves of the eye?  Denies any upper or lower extremity weakness.  No numbness or tingling.  No facial droop.  No other weakness. Prior to his symptoms, he had an episode of vertigo 1 week ago which was relieved by Meclizine. He notes a few days after his vertigo he was feeling unwell. Denies speech changes.   History obtained from patient and past medical records. No interpreter used during encounter.       Home Medications Prior to Admission medications   Medication Sig Start Date End Date Taking? Authorizing Provider  acetaminophen (TYLENOL) 500 MG tablet Take 500-1,000 mg by mouth every 8 (eight) hours as needed for moderate pain or headache.    [provider]  amoxicillin (AMOXIL) 500 MG capsule Take 2,000 mg by mouth See admin instructions. Take 4 capsules (2000 mg) by mouth 1 hours prior to procedure.    [provider]  Ascorbic Acid (VITAMIN C PO) Take 1 packet by mouth daily.    [provider]  aspirin EC 81 MG tablet Take 81 mg by mouth as directed. MWF    [provider]  ibuprofen (ADVIL,MOTRIN) 200 MG tablet Take 400-600 mg by mouth every 8  (eight) hours as needed (for pain.).    [provider]  ipratropium (ATROVENT) 0.06 % nasal spray Place 1 spray into both nostrils 3 (three) times daily as needed (allergies).    [provider]  lisinopril (PRINIVIL,ZESTRIL) 20 MG tablet Take 20 mg by mouth daily.     [provider]  loratadine (CLARITIN) 10 MG tablet Take 10 mg by mouth daily.    [provider]  metoprolol succinate (TOPROL-XL) 100 MG 24 hr tablet Take 100 mg by mouth daily with breakfast.     [provider]  Misc Natural Products (ELDERBERRY IMMUNE COMPLEX) CHEW Chew 1 tablet by mouth daily.    [provider]  Multiple Vitamins-Minerals (MULTIVITAMIN ADULTS) TABS Take 1 tablet by mouth daily.    [provider]  ondansetron (ZOFRAN) 4 MG tablet Take 4 mg by mouth every 8 (eight) hours as needed for nausea or vomiting.    [provider]  pantoprazole (PROTONIX) 40 MG tablet Take 40 mg by mouth daily before breakfast.     [provider]      Allergies    Patient has no known allergies.    Review of Systems   Review of Systems  Eyes:  Positive for visual disturbance.  Neurological:  Positive for dizziness (resolved). Negative for headaches.    Physical Exam Updated Vital Signs BP (!) 163/79 (BP Location: Left Arm)  Pulse 78   Temp 98.3 F (36.8 C) (Oral)   SpO2 94%  Physical Exam Vitals and nursing note reviewed.  Constitutional:      General: He is not in acute distress.    Appearance: He is not ill-appearing.  HENT:     Head: Normocephalic.  Eyes:     Pupils: Pupils are equal, round, and reactive to light.     Comments: Ptosis of left eyelid  Cardiovascular:     Rate and Rhythm: Normal rate and regular rhythm.     Pulses: Normal pulses.     Heart sounds: Normal heart sounds. No murmur heard.    No friction rub. No gallop.  Pulmonary:     Effort: Pulmonary effort is normal.     Breath sounds: Normal breath sounds.   Abdominal:     General: Abdomen is flat. There is no distension.     Palpations: Abdomen is soft.     Tenderness: There is no abdominal tenderness. There is no guarding or rebound.  Musculoskeletal:        General: Normal range of motion.     Cervical back: Neck supple.  Skin:    General: Skin is warm and dry.  Neurological:     General: No focal deficit present.     Mental Status: He is alert.     Comments: Ptosis of left eyelid. No facial droop Speech is clear, able to follow commands CN III-XII intact Normal strength in upper and lower extremities bilaterally including dorsiflexion and plantar flexion, strong and equal grip strength Sensation grossly intact throughout Moves extremities without ataxia, coordination intact No pronator drift    Psychiatric:        Mood and Affect: Mood normal.        Behavior: Behavior normal.     ED Results / Procedures / Treatments   Labs (all labs ordered are listed, but only abnormal results are displayed) Labs Reviewed  BASIC METABOLIC PANEL - Abnormal; Notable for the following components:      Result Value   Glucose, Bld 122 (*)    BUN 24 (*)    Creatinine, Ser 1.41 (*)    GFR, Estimated 48 (*)    All other components within normal limits  CBC  ACETYLCHOLINE RECEPTOR, BINDING  MUSK ANTIBODIES  MISC [facility] TEST (SEND OUT)    EKG None  Radiology MR Brain W and Wo Contrast  Result Date: 09/12/2022 CLINICAL DATA:  Vertigo spell 1 week ago followed by blurred and double vision and drooping under left eye. EXAM: MRI HEAD WITHOUT AND WITH CONTRAST TECHNIQUE: Multiplanar, multiecho pulse sequences of the brain and surrounding structures were obtained without and with intravenous contrast. CONTRAST:  7.64mL GADAVIST GADOBUTROL 1 MMOL/ML IV SOLN COMPARISON:  CT head with and without contrast 1 day prior FINDINGS: Brain: There is no acute intracranial hemorrhage, extra-axial fluid collection, or acute infarct Parenchymal volume is  within expected limits for age. The ventricles are normal in size. Patchy FLAIR signal abnormality in the supratentorial white matter likely reflects sequela of mild underlying chronic small-vessel ischemic change. There are small remote infarcts in the bilateral cerebellar hemispheres. The pituitary and suprasellar region are normal there is no mass lesion or abnormal enhancement. There is no mass effect or midline shift. Vascular: Normal flow voids. Skull and upper cervical spine: There is no suspicious marrow signal abnormality. There is mild anterolisthesis of C2 on C3. Postsurgical changes at C3-C4 are noted. Sinuses/Orbits: The paranasal sinuses are clear.  Bilateral lens implants are in place. The globes and orbits are otherwise unremarkable. Other: The mastoid air cells and middle ear cavities are clear. IMPRESSION: 1. No acute intracranial pathology or other finding to explain the patient's symptoms. 2. 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 3. On initial injection of intravenous contrast, the patient's IV infiltrative and 7.5 cc contrast was injected. Per the technologist, there was mild swelling at the injection site but no significant pain or neurologic symptoms. Recommend monitoring of the IV site while the patient is in the ED/inpatient, with instructions to return if progressive pain or neurologic symptoms develop. Electronically Signed   By: Lesia Hausen M.D.   On: 09/12/2022 20:33   CT HEAD W & WO CONTRAST ( )  Result Date: 09/11/2022 CLINICAL DATA:  Left eye ptosis. Blurred vision. Symptoms of 1 week duration. EXAM: CT HEAD WITHOUT AND WITH CONTRAST TECHNIQUE: Contiguous axial images were obtained from the base of the skull through the vertex without and with intravenous contrast. RADIATION DOSE REDUCTION: This exam was performed according to the departmental dose-optimization program which includes automated  exposure control, adjustment of the mA and/or kV according to patient size and/or use of iterative reconstruction technique. CONTRAST:  75mL ISOVUE-300 IOPAMIDOL (ISOVUE-300) INJECTION 61% COMPARISON:  MRI 11/16/2014 FINDINGS: Brain: Age related volume loss. No focal abnormality seen affecting the brainstem. Mild/minimal chronic small-vessel change of the white matter. No sign of acute infarction, mass lesion, hemorrhage, hydrocephalus or extra-axial collection. After contrast administration, no abnormal enhancement occurs Vascular: There is atherosclerotic calcification of the major vessels at the base of the brain. Skull: Negative Sinuses/Orbits: Clear/normal Other: None IMPRESSION: No acute finding by CT. Age related volume loss. Mild/minimal chronic small-vessel change of the white matter. Electronically Signed   By: Paulina Fusi M.D.   On: 09/11/2022 16:52    Procedures Procedures    Medications Ordered in ED Medications  pyridostigmine (MESTINON) injection 2 mg (has no administration in time range)  pyridostigmine (MESTINON) tablet 30 mg (has no administration in time range)  gadobutrol (GADAVIST) 1 MMOL/ML injection 7.5 mL (7.5 mLs Intravenous Contrast Given 09/12/22 2020)    ED Course/ Medical Decision Making/ A&P Clinical Course as of 09/12/22 2246  Tue Sep 12, 2022  1809 Discussed with Dr. Selina Cooley with neurology who recommends MRI brain w/ and w/o [CA]  [id number] 89 year old male here with left ptosis.  Has been seen by ophthalmology and referred here for further evaluation.  Neuro is recommending admission for further workup.  Patient agreement plan for admission. [MB]    Clinical Course User Index [CA] Mannie Stabile, PA-C [MB] Terrilee Files, MD                                 Medical Decision Making Amount and/or Complexity of Data Reviewed Independent Historian: caregiver Labs: ordered. Decision-making details documented in ED Course. Radiology: ordered and independent  interpretation performed. Decision-making details documented in ED Course.  Risk Prescription drug management. Decision regarding hospitalization.   This patient presents to the ED for concern of diplopia and ptosis, this involves an extensive number of treatment options, and is a complaint that carries with it a high risk of complications and morbidity.  The differential diagnosis includes CVA, myasthenia gravis, etc  89 year old male presents the ED due to diplopia and ptosis.  Patient seen by ophthalmology earlier today and sent to  the ED for MRI brain to rule out CVA versus myasthenia gravis.  Symptoms have been present for the past 3 to 4 days.  No other weakness or speech changes.  Denies headache.  Did have a episode of dizziness 1 week ago which was relieved with meclizine then felt unwell for the past few days. Had CT head yesterday which was negative for any acute abnormalities. Upon arrival, stable vitals.  Patient in no acute distress.  Physical exam significant for ptosis of left eyelid.  No facial droop.  Normal speech.  No pronator drift.  Equal grip strength.  MRI brain ordered.  Routine labs ordered.  CBC unremarkable.  No leukocytosis.  Normal hemoglobin.  BMP significant for elevated creatinine at 1.41 and BUN at 24.  No major electrolyte derangements.  Hyperglycemia 122.  MRI negative for any acute abnormalities.  Does demonstrate small remote infarcts to bilateral cerebellar hemispheres.  Discussed with Dr. Derry Lory with neurology who evaluated patient at bedside who feels patient's symptoms could be related to myasthenia gravis. He recommends overnight observation with consult to speech therapy for swallow study. Medications and further labs placed by neurology.  10:46 PM Discussed with Dr. Maryjean Ka with [facility] who agrees to admit patient.   Lives at home Has PCP Hx DM, HTN  Discussed with Dr. Charm Barges who evaluated patient at bedside and agrees with assessment and  plan.       Final Clinical Impression(s) / ED Diagnoses Final diagnoses:  Diplopia  Ptosis of left eyelid    Rx / DC Orders ED Discharge Orders     None         Jesusita Oka 09/12/22 2251    Terrilee Files, MD 09/13/22 [phone]

## 2022-09-12 NOTE — ED Notes (Signed)
Patient transported to MRI 

## 2022-09-12 NOTE — Assessment & Plan Note (Addendum)
X3 days a.w. 4 days blurry vision/uniocular diplopia. I suspect patient mydriasis is due to dilation done at ophthalmology office this AM. 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 Daugther shows photos to suggest that this worsens as the day progresses. Appreicate neurolog input. Concern for Ach ab disese. Testing is pendign. I wll hold off on atrovent sprays nasal discharge for patient as well as homatropine for cough. Pyridostigmine per neurology. Monitor for miosis after dilation meds resolved.

## 2022-09-12 NOTE — ED Notes (Signed)
PA in triage to assess pt.

## 2022-09-12 NOTE — H&P (Addendum)
History and Physical    Patient: Joseph [name] MWU:[id number] DOB: 15-Feb-1931 DOA: 09/12/2022 DOS: the patient was seen and examined on 09/12/2022 PCP: Blair Heys, MD  Patient coming from: Home>> PCP>> ophthalmology office.  Chief Complaint:  Chief Complaint  Patient presents with   Stroke s/s   HPI: Joseph [name] is a 89 y.o. male with medical history significant of diagnosis as mentioned below. Patinet was in his usual state of ehaltht till about 4 days ago when ptaient reports new uniocular diplopia. ?side by side. No aggravating/relieving facotrs. 3 days ago patient developed progressive weakness of left eye lid causing droopinesss. Again, no aggravating/relieving factors. Patient feesl that the right eye lid has started to get droopin in last 24 hours.  There is no other sensory/swallow/motor/urinary/bowel symptoms. Patinte was seen by PCP yesterday, had CT head done. Seen by ophthalmology today, referred to the ER.  Patient otherwise feels good, ambulatory. Medical eval is sought. Per RN report, patient already passed swallow screening.  Review of Systems: As mentioned in the history of present illness. All other systems reviewed and are negative. Past Medical History:  Diagnosis Date   Arthritis    Cancer (HCC)    HX PROSTATE CANCER - MANY YRS AGO - ? RADIATION TX. Lung cancer   Diabetes mellitus without complication (HCC)    DIET CONTROLLED   GERD (gastroesophageal reflux disease)    Hearing loss    History of hiatal hernia    History of kidney stones    History of nonunion of fracture    LEFT PROXIMAL FEMUR   Hypertension    Pneumonia    Seasonal allergies    Past Surgical History:  Procedure Laterality Date   CONVERSION TO TOTAL HIP Left 03/19/2014   Procedure: CONVERSION OF PREVIOUS HIP SURGERY TO LEFT TOTAL HIP ARTHROPLASTY;  Surgeon: Loanne Drilling, MD;  Location: [facility];  Service: Orthopedics;  Laterality: Left;   CYSTOSCOPY WITH RETROGRADE PYELOGRAM,  URETEROSCOPY AND STENT PLACEMENT Right 10/24/2020   Procedure: CYSTOSCOPY WITH RETROGRADE PYELOGRAM, URETEROSCOPY AND STENT PLACEMENT;  Surgeon: Crist Fat, MD;  Location: [facility];  Service: Urology;  Laterality: Right;   FEMUR IM NAIL  01/18/2012   Procedure: INTRAMEDULLARY (IM) NAIL FEMORAL;  Surgeon: Loanne Drilling, MD;  Location: [facility];  Service: Orthopedics;  Laterality: Left;   HARDWARE REMOVAL Left 01/13/2013   Procedure: HARDWARE REVISION LEFT HIP AND EXCHANGE OF COMPRESSION SCREW;  Surgeon: Loanne Drilling, MD;  Location: [facility];  Service: Orthopedics;  Laterality: Left;   HELLER MYOTOMY     HIATAL HERNIA REPAIR  2011   JOINT REPLACEMENT  12/2011   BIL KNEE REPLACEMENTS   LOBECTOMY Left    left upper lobe   THORACOSCOPY     VIDEO BRONCHOSCOPY WITH ENDOBRONCHIAL NAVIGATION N/A 03/21/2017   Procedure: VIDEO BRONCHOSCOPY WITH ENDOBRONCHIAL NAVIGATION;  Surgeon: Loreli Slot, MD;  Location: [facility] OR;  Service: Thoracic;  Laterality: N/A;   Social History:  reports that he has never smoked. He has never used smokeless tobacco. He reports that he does not drink alcohol and does not use drugs.  No Known Allergies  Family History  Problem Relation Age of Onset   Heart attack Mother    Hypertension Mother    Heart attack Father    Hypertension Father    Colon cancer Neg Hx    Esophageal cancer Neg Hx    Rectal cancer Neg Hx    Stomach cancer Neg Hx  Prior to Admission medications   Medication Sig Start Date End Date Taking? Authorizing Provider  acetaminophen (TYLENOL) 500 MG tablet Take 500-1,000 mg by mouth every 8 (eight) hours as needed for moderate pain or headache.    [provider]  amoxicillin (AMOXIL) 500 MG capsule Take 2,000 mg by mouth See admin instructions. Take 4 capsules (2000 mg) by mouth 1 hours prior to procedure.    [provider]  Ascorbic Acid (VITAMIN C PO) Take 1 packet by mouth daily.    [provider]   aspirin EC 81 MG tablet Take 81 mg by mouth as directed. MWF    [provider]  ibuprofen (ADVIL,MOTRIN) 200 MG tablet Take 400-600 mg by mouth every 8 (eight) hours as needed (for pain.).    [provider]  ipratropium (ATROVENT) 0.06 % nasal spray Place 1 spray into both nostrils 3 (three) times daily as needed (allergies).    [provider]  lisinopril (PRINIVIL,ZESTRIL) 20 MG tablet Take 20 mg by mouth daily.     [provider]  loratadine (CLARITIN) 10 MG tablet Take 10 mg by mouth daily.    [provider]  metoprolol succinate (TOPROL-XL) 100 MG 24 hr tablet Take 100 mg by mouth daily with breakfast.     [provider]  Misc Natural Products (ELDERBERRY IMMUNE COMPLEX) CHEW Chew 1 tablet by mouth daily.    [provider]  Multiple Vitamins-Minerals (MULTIVITAMIN ADULTS) TABS Take 1 tablet by mouth daily.    [provider]  ondansetron (ZOFRAN) 4 MG tablet Take 4 mg by mouth every 8 (eight) hours as needed for nausea or vomiting.    [provider]  pantoprazole (PROTONIX) 40 MG tablet Take 40 mg by mouth daily before breakfast.     [provider]    Physical Exam: Vitals:   09/12/22 1645 09/12/22 2242  BP: (!) 163/79   Pulse: 78   Temp: 98.2 F (36.8 C) 98.3 F (36.8 C)  TempSrc: Oral Oral  SpO2: 94%    General: Patient appears alert awake gives a coherent account of assistance history symptoms no distress Respiratory; bilateral intravesicular Cardiovascular exam S1-S2 normal Abdomen soft nontender Extremities warm without edema No focal motor deficit in extremities is appreciated.  Symmetric facies.  Ptosis of the left eye is noted to middle of pupil.  Patient seems to have 3 mm pupil bilaterally round with very poor reflex bilaterally.  On gaze testing there is some palsy of right eye lateral gaze. Data Reviewed:  Labs on Admission:  Results for orders placed or performed  during the hospital encounter of 09/12/22 (from the past 24 hour(s))  CBC     Status: None   Collection Time: 09/12/22  5:09 PM  Result Value Ref Range   WBC 6.8 4.0 - 10.5 K/uL   RBC 5.45 4.22 - 5.81 MIL/uL   Hemoglobin 14.6 13.0 - 17.0 g/dL   HCT 16.1 09.6 - 04.5 %   MCV 85.9 80.0 - 100.0 fL   MCH 26.8 26.0 - 34.0 pg   MCHC 31.2 30.0 - 36.0 g/dL   RDW 40.9 81.1 - 91.4 %   Platelets 215 150 - 400 K/uL   nRBC 0.0 0.0 - 0.2 %  Basic metabolic panel     Status: Abnormal   Collection Time: 09/12/22  5:09 PM  Result Value Ref Range   Sodium 137 135 - 145 mmol/L   Potassium 4.7 3.5 - 5.1 mmol/L  Chloride 105 98 - 111 mmol/L   CO2 23 22 - 32 mmol/L   Glucose, Bld 122 (H) 70 - 99 mg/dL   BUN 24 (H) 8 - 23 mg/dL   Creatinine, Ser 1.47 (H) 0.61 - 1.24 mg/dL   Calcium 8.9 8.9 - 82.9 mg/dL   GFR, Estimated 48 (L) >60 mL/min   Anion gap 9 5 - 15   Basic Metabolic Panel: Recent Labs  Lab 09/12/22 1709  NA 137  K 4.7  CL 105  CO2 23  GLUCOSE 122*  BUN 24*  CREATININE 1.41*  CALCIUM 8.9   Liver Function Tests: No results for input(s): "AST", "ALT", "ALKPHOS", "BILITOT", "PROT", "ALBUMIN" in the last 168 hours. No results for input(s): "LIPASE", "AMYLASE" in the last 168 hours. No results for input(s): "AMMONIA" in the last 168 hours. CBC: Recent Labs  Lab 09/12/22 1709  WBC 6.8  HGB 14.6  HCT 46.8  MCV 85.9  PLT 215   Cardiac Enzymes: No results for input(s): "CKTOTAL", "CKMB", "CKMBINDEX", "TROPONINIHS" in the last 168 hours.  BNP (last 3 results) No results for input(s): "PROBNP" in the last 8760 hours. CBG: No results for input(s): "GLUCAP" in the last 168 hours.  Radiological Exams on Admission:  MR Brain W and Wo Contrast  Result Date: 09/12/2022 CLINICAL DATA:  Vertigo spell 1 week ago followed by blurred and double vision and drooping under left eye. EXAM: MRI HEAD WITHOUT AND WITH CONTRAST TECHNIQUE: Multiplanar, multiecho pulse sequences of the brain and  surrounding structures were obtained without and with intravenous contrast. CONTRAST:  7.27mL GADAVIST GADOBUTROL 1 MMOL/ML IV SOLN COMPARISON:  CT head with and without contrast 1 day prior FINDINGS: Brain: There is no acute intracranial hemorrhage, extra-axial fluid collection, or acute infarct Parenchymal volume is within expected limits for age. The ventricles are normal in size. Patchy FLAIR signal abnormality in the supratentorial white matter likely reflects sequela of mild underlying chronic small-vessel ischemic change. There are small remote infarcts in the bilateral cerebellar hemispheres. The pituitary and suprasellar region are normal there is no mass lesion or abnormal enhancement. There is no mass effect or midline shift. Vascular: Normal flow voids. Skull and upper cervical spine: There is no suspicious marrow signal abnormality. There is mild anterolisthesis of C2 on C3. Postsurgical changes at C3-C4 are noted. Sinuses/Orbits: The paranasal sinuses are clear. Bilateral lens implants are in place. The globes and orbits are otherwise unremarkable. Other: The mastoid air cells and middle ear cavities are clear. IMPRESSION: 1. No acute intracranial pathology or other finding to explain the patient's symptoms. 2. 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 3. On initial injection of intravenous contrast, the patient's IV infiltrative and 7.5 cc contrast was injected. Per the technologist, there was mild swelling at the injection site but no significant pain or neurologic symptoms. Recommend monitoring of the IV site while the patient is in the ED/inpatient, with instructions to return if progressive pain or neurologic symptoms develop. Electronically Signed   By: Lesia Hausen M.D.   On: 09/12/2022 20:33   CT HEAD W & WO CONTRAST ( )  Result Date: 09/11/2022 CLINICAL DATA:  Left eye ptosis. Blurred vision. Symptoms of 1 week  duration. EXAM: CT HEAD WITHOUT AND WITH CONTRAST TECHNIQUE: Contiguous axial images were obtained from the base of the skull through the vertex without and with intravenous contrast. RADIATION DOSE REDUCTION: This exam was performed according to the departmental dose-optimization program which  includes automated exposure control, adjustment of the mA and/or kV according to patient size and/or use of iterative reconstruction technique. CONTRAST:  75mL ISOVUE-300 IOPAMIDOL (ISOVUE-300) INJECTION 61% COMPARISON:  MRI 11/16/2014 FINDINGS: Brain: Age related volume loss. No focal abnormality seen affecting the brainstem. Mild/minimal chronic small-vessel change of the white matter. No sign of acute infarction, mass lesion, hemorrhage, hydrocephalus or extra-axial collection. After contrast administration, no abnormal enhancement occurs Vascular: There is atherosclerotic calcification of the major vessels at the base of the brain. Skull: Negative Sinuses/Orbits: Clear/normal Other: None IMPRESSION: No acute finding by CT. Age related volume loss. Mild/minimal chronic small-vessel change of the white matter. Electronically Signed   By: Paulina Fusi M.D.   On: 09/11/2022 16:52    EKG: Independently reviewed. nsr   Assessment and Plan: Ptosis X3 days a.w. 4 days blurry vision/uniocular diplopia. I suspect patient mydriasis is due to dilation done at ophthalmology office this AM. 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 Daugther shows photos to suggest that this worsens as the day progresses. Appreicate neurolog input. Concern for Ach ab disese. Testing is pendign. I wll hold off on atrovent sprays nasal discharge for patient as well as homatropine for cough. Pyridostigmine per neurology. Monitor for miosis after dilation meds resolved.    Media Information   Home med list provided by daughter.  Photos  Med list  09/12/2022 23:09  Attached To:  Hospital Encounter on 09/12/22  Source  Information  Nolberto Hanlon, MD  [facility]  Document History       Advance Care Planning:   Code Status: Prior full code.  Consults: neurology  Family Communication: daughter at the bedside.  Severity of Illness: The appropriate patient status for this patient is OBSERVATION. Observation status is judged to be reasonable and necessary in order to provide the required intensity of service to ensure the patient's safety. The patient's presenting symptoms, physical exam findings, and initial radiographic and laboratory data in the context of their medical condition is felt to place them at decreased risk for further clinical deterioration. Furthermore, it is anticipated that the patient will be medically stable for discharge from the hospital within 2 midnights of admission.   Author: Nolberto Hanlon, MD 09/12/2022 10:50 PM  For on call review [email or web address].

## 2022-09-12 NOTE — ED Provider Triage Note (Signed)
Emergency Medicine Provider Triage Evaluation Note  Joseph [name] , a 89 y.o. male  was evaluated in triage.  Pt complains of blurred vision in left eye and eye drooping x5 days. Daughter is at bedside.  Review of Systems  Positive: Diplopia, blurred vision Negative: Slurred speech, weakness  Physical Exam  BP (!) 163/79 (BP Location: Left Arm)   Pulse 78   Temp 98.2 F (36.8 C) (Oral)   SpO2 94%  Gen:   Awake, no distress   Resp:  Normal effort  MSK:   Moves extremities without difficulty  Other:  Unsteady gait, left hand difficulty with nose-to-finger touch test  Medical Decision Making  Medically screening exam initiated at 5:07 PM.  Appropriate orders placed.  [name] was informed that the remainder of the evaluation will be completed by another provider, this initial triage assessment does not replace that evaluation, and the importance of remaining in the ED until their evaluation is complete.    Maxwell Marion, PA-C 09/12/22 1715

## 2022-09-12 NOTE — Consult Note (Signed)
NEUROLOGY CONSULTATION NOTE   Date of service: September 12, 2022 Patient Name: Joseph [name] MRN:  [id number] DOB:  Mar 18, 1931 Reason for consult: "ptosis" Requesting Provider: Terrilee Files, MD _ _ _   _ __   _ __ _ _  __ __   _ __   __ _  History of Present Illness  Joseph [name] is a 89 y.o. male with PMH significant for DM2, GERD, HTN, hearing loss, NSCLC s/p lobectomy who presents with double vision, blurred vision, ptosis.  Patient is somewhat hard of hearing and daughter at bedside assist with history.  Reports that on Friday, they noted the patient had some double vision.  They were going to pharmacy and patient reported seeing double yellow lines on the curb.  On Saturday, they noted that his left eyelid was droopy it was partially covering his left eye.  Now he can barely open his left eye and he has some mild ptosis on his right eye.  They went in and saw ophthalmology today and was directed to the ED for further workup and evaluation for potential stroke and myasthenia gravis.  Patient does endorse that over the last year or 2, he has been coughing and choking on his food, he has been getting more winded easily.  He endorses a history of lung cancer and had partial lobectomy in 2019.   ROS   Constitutional Denies weight loss, fever and chills.   HEENT Denies changes in vision and hearing.   Respiratory Denies SOB and cough.   CV Denies palpitations and CP   GI Denies abdominal pain, nausea, vomiting and diarrhea.   GU Denies dysuria and urinary frequency.   MSK Denies myalgia and joint pain.   Skin Denies rash and pruritus.   Neurological Denies headache and syncope.   Psychiatric Denies recent changes in mood. Denies anxiety and depression.    Past History   Past Medical History:  Diagnosis Date   Arthritis    Cancer (HCC)    HX PROSTATE CANCER - MANY YRS AGO - ? RADIATION TX. Lung cancer   Diabetes mellitus without complication (HCC)    DIET CONTROLLED    GERD (gastroesophageal reflux disease)    Hearing loss    History of hiatal hernia    History of kidney stones    History of nonunion of fracture    LEFT PROXIMAL FEMUR   Hypertension    Pneumonia    Seasonal allergies    Past Surgical History:  Procedure Laterality Date   CONVERSION TO TOTAL HIP Left 03/19/2014   Procedure: CONVERSION OF PREVIOUS HIP SURGERY TO LEFT TOTAL HIP ARTHROPLASTY;  Surgeon: Loanne Drilling, MD;  Location: [facility];  Service: Orthopedics;  Laterality: Left;   CYSTOSCOPY WITH RETROGRADE PYELOGRAM, URETEROSCOPY AND STENT PLACEMENT Right 10/24/2020   Procedure: CYSTOSCOPY WITH RETROGRADE PYELOGRAM, URETEROSCOPY AND STENT PLACEMENT;  Surgeon: Crist Fat, MD;  Location: [facility];  Service: Urology;  Laterality: Right;   FEMUR IM NAIL  01/18/2012   Procedure: INTRAMEDULLARY (IM) NAIL FEMORAL;  Surgeon: Loanne Drilling, MD;  Location: [facility];  Service: Orthopedics;  Laterality: Left;   HARDWARE REMOVAL Left 01/13/2013   Procedure: HARDWARE REVISION LEFT HIP AND EXCHANGE OF COMPRESSION SCREW;  Surgeon: Loanne Drilling, MD;  Location: [facility];  Service: Orthopedics;  Laterality: Left;   HELLER MYOTOMY     HIATAL HERNIA REPAIR  2011   JOINT REPLACEMENT  12/2011   BIL KNEE REPLACEMENTS  LOBECTOMY Left    left upper lobe   THORACOSCOPY     VIDEO BRONCHOSCOPY WITH ENDOBRONCHIAL NAVIGATION N/A 03/21/2017   Procedure: VIDEO BRONCHOSCOPY WITH ENDOBRONCHIAL NAVIGATION;  Surgeon: Loreli Slot, MD;  Location: [facility];  Service: Thoracic;  Laterality: N/A;   Family History  Problem Relation Age of Onset   Heart attack Mother    Hypertension Mother    Heart attack Father    Hypertension Father    Colon cancer Neg Hx    Esophageal cancer Neg Hx    Rectal cancer Neg Hx    Stomach cancer Neg Hx    Social History   Socioeconomic History   Marital status: Widowed    Spouse name: Not on file   Number of children: Not on file   Years of education: Not on file    Highest education level: Not on file  Occupational History   Not on file  Tobacco Use   Smoking status: Never   Smokeless tobacco: Never  Vaping Use   Vaping status: Never Used  Substance and Sexual Activity   Alcohol use: No   Drug use: No   Sexual activity: Not on file  Other Topics Concern   Not on file  Social History Narrative   Not on file   Social Determinants of Health   Financial Resource Strain: Not on file  Food Insecurity: No Food Insecurity (01/27/2022)   Hunger Vital Sign    Worried About Running Out of Food in the Last Year: Never true    Ran Out of Food in the Last Year: Never true  Transportation Needs: No Transportation Needs (01/27/2022)   PRAPARE - Administrator, Civil Service (Medical): No    Lack of Transportation (Non-Medical): No  Physical Activity: Not on file  Stress: Not on file  Social Connections: Not on file   No Known Allergies  Medications  (Not in a hospital admission)    Vitals   Vitals:   09/12/22 1645  BP: (!) 163/79  Pulse: 78  Temp: 98.2 F (36.8 C)  TempSrc: Oral  SpO2: 94%     There is no height or weight on file to calculate BMI.  Physical Exam   General: Laying comfortably in bed; in no acute distress.  HENT: Normal oropharynx and mucosa. Normal external appearance of ears and nose.  Neck: Supple, no pain or tenderness  CV: No JVD. No peripheral edema.  Pulmonary: Symmetric Chest rise. Normal respiratory effort.  Abdomen: Soft to touch, non-tender.  Ext: No cyanosis, edema, or deformity  Skin: No rash. Normal palpation of skin.   Musculoskeletal: Normal digits and nails by inspection. No clubbing.   Neurologic Examination  Mental status/Cognition: Alert, oriented to self, place, month and year, good attention.  Speech/language: Fluent, comprehension intact, object naming intact, repetition intact.  Cranial nerves:   CN II Pupils equal and reactive to light, no VF deficits    CN III,IV,VI EOM  intact, no gaze preference or deviation, no nystagmus, BL lid ptosis left worse than right with BL upper eyelids encroaching on his pupil   CN V normal sensation in V1, V2, and V3 segments bilaterally    CN VII no asymmetry, no nasolabial fold flattening    CN VIII normal hearing to speech    CN IX & X normal palatal elevation, no uvular deviation    CN XI 5/5 head turn and 5/5 shoulder shrug bilaterally    CN XII midline tongue protrusion  Motor:  Muscle bulk: normal, tone normal, pronator drift none tremor none Neck flexion: 4+/5 Neck extension: 4+/5 Mvmt Root Nerve  Muscle Right Left Comments  SA C5/6 Ax Deltoid 5 5   EF C5/6 Mc Biceps 5 5   EE C6/7/8 Rad Triceps 5 5   WF C6/7 Med FCR     WE C7/8 PIN ECU     F Ab C8/T1 U ADM/FDI 5 5   HF L1/2/3 Fem Illopsoas 4+ 4+   KE L2/3/4 Fem Quad 5 5   DF L4/5 D Peron Tib Ant 5 5   PF S1/2 Tibial Grc/Sol 5 5    Sensation:  Light touch Intact throughout   Pin prick    Temperature    Vibration   Proprioception    Coordination/Complex Motor:  - Finger to Nose intact BL - Heel to shin intact BL - Rapid alternating movement are normal - Gait: deferred.  Labs   CBC:  Recent Labs  Lab 09/12/22 1709  WBC 6.8  HGB 14.6  HCT 46.8  MCV 85.9  PLT 215    Basic Metabolic Panel:  Lab Results  Component Value Date   NA 137 09/12/2022   K 4.7 09/12/2022   CO2 23 09/12/2022   GLUCOSE 122 (H) 09/12/2022   BUN 24 (H) 09/12/2022   CREATININE 1.41 (H) 09/12/2022   CALCIUM 8.9 09/12/2022   GFRNONAA 48 (L) 09/12/2022   GFRAA >60 03/21/2017   Lipid Panel: No results found for: "LDLCALC" HgbA1c:  Lab Results  Component Value Date   HGBA1C 7.0 (H) 10/25/2020   Urine Drug Screen: No results found for: "LABOPIA", "COCAINSCRNUR", "LABBENZ", "AMPHETMU", "THCU", "LABBARB"  Alcohol Level No results found for: "ETH"  CT Head without contrast(Personally reviewed): CTH was negative for a large hypodensity concerning for a large  territory infarct or hyperdensity concerning for an ICH  MRI Brain(Personally reviewed): No acute stroke, specifically no brainstem strokes.  Impression   Joseph [name] is a 89 y.o. male with PMH significant for DM2, GERD, HTN, hearing loss, NSCLC s/p lobectomy who presents with double vision, blurred vision, ptosis. On further history endorses getting winded easily, nasal tone, choking on food or regurgitating food more recently.  Symptoms seem to be concerning for myasthenia gravis, althou, not entirely sure of the fatigability of his symptoms and with his hx of lung cancer, lambert eaton syndrome also on differential.  He seems to not be in florid myasthenic crisis at this time and I think we can start with just mestinon and see how he does. He will need myasthenia labs and EMG/NCS to confirm the suspicion for myasthenia.  Will recommend observing him overnight to see if he can tolerate mestinon and also get NIFs and Vcs and get a formal swallow eval in AM.  Recommendations  - Every 6 hours NIFs and VC to ensure they are stable. - NPO, formal swallow eval in AM. - Recommend Mestinon PO 30mg  TID. If unable to swallow, can switch from PO to IV.(30mg  PO is equivalent to 1mg  IV). - Medications that may worsen or trigger MG exacerbation: Class IA antiarrhythmics, magnesium, flouroquinolones, macrolides, aminoglycosides, penicillamine, curare, interferon alpha, botox, quinine. Use with caution: calcium channel blocker, beta blockers and statins. - hold off on IVIG as he does not appear to be in florid myasthenia crisis. - AchR ab, MuSK, LRP4, P/Q type VGCC Ab. - Follow up with Dr. Jacklynn Ganong with [facility] for outpatient EMG/NCS.  ______________________________________________________________________   Thank you for  the opportunity to take part in the care of this patient. If you have any further questions, please contact the neurology consultation  attending.  Signed,  Erick Blinks [facility] _ _ _   _ __   _ __ _ _  __ __   _ __   __ _

## 2022-09-13 ENCOUNTER — Observation Stay (HOSPITAL_COMMUNITY): Payer: PPO

## 2022-09-13 DIAGNOSIS — R1310 Dysphagia, unspecified: Secondary | ICD-10-CM | POA: Diagnosis not present

## 2022-09-13 DIAGNOSIS — R739 Hyperglycemia, unspecified: Secondary | ICD-10-CM | POA: Diagnosis not present

## 2022-09-13 DIAGNOSIS — G7000 Myasthenia gravis without (acute) exacerbation: Secondary | ICD-10-CM | POA: Diagnosis not present

## 2022-09-13 DIAGNOSIS — H02402 Unspecified ptosis of left eyelid: Secondary | ICD-10-CM | POA: Diagnosis not present

## 2022-09-13 DIAGNOSIS — R1319 Other dysphagia: Secondary | ICD-10-CM | POA: Diagnosis not present

## 2022-09-13 LAB — BASIC METABOLIC PANEL
Anion gap: 9 (ref 5–15)
BUN: 22 mg/dL (ref 8–23)
CO2: 21 mmol/L — ABNORMAL LOW (ref 22–32)
Calcium: 8.4 mg/dL — ABNORMAL LOW (ref 8.9–10.3)
Chloride: 107 mmol/L (ref 98–111)
Creatinine, Ser: 1.26 mg/dL — ABNORMAL HIGH (ref 0.61–1.24)
GFR, Estimated: 55 mL/min — ABNORMAL LOW (ref >60)
Glucose, Bld: 144 mg/dL — ABNORMAL HIGH (ref 70–99)
Potassium: 3.8 mmol/L (ref 3.5–5.1)
Sodium: 137 mmol/L (ref 135–145)

## 2022-09-13 LAB — HEMOGLOBIN A1C
Hgb A1c MFr Bld: 7.1 % — ABNORMAL HIGH (ref 4.8–5.6)
Mean Plasma Glucose: 157.07 mg/dL

## 2022-09-13 LAB — HEPATIC FUNCTION PANEL
ALT: 12 U/L (ref 0–44)
AST: 15 U/L (ref 15–41)
Albumin: 3 g/dL — ABNORMAL LOW (ref 3.5–5.0)
Alkaline Phosphatase: 64 U/L (ref 38–126)
Bilirubin, Direct: 0.1 mg/dL (ref 0.0–0.2)
Indirect Bilirubin: 0.5 mg/dL (ref 0.3–0.9)
Total Bilirubin: 0.6 mg/dL (ref 0.3–1.2)
Total Protein: 5.6 g/dL — ABNORMAL LOW (ref 6.5–8.1)

## 2022-09-13 LAB — CK: Total CK: 50 U/L (ref 49–397)

## 2022-09-13 NOTE — Plan of Care (Signed)
  Problem: Education: Goal: Knowledge of General Education information will improve Description Including pain rating scale, medication(s)/side effects and non-pharmacologic comfort measures Outcome: Progressing   

## 2022-09-13 NOTE — Progress Notes (Signed)
PROGRESS NOTE  Joseph [name] ZOX:[id number] DOB: 1931/07/20   PCP: Blair Heys, MD  Patient is from: Home.  DOA: 09/12/2022 LOS: 0  Chief complaints Chief Complaint  Patient presents with   Stroke s/s     Brief Narrative / Interim history: 89 year old M with PMH of diet-controlled diabetes, prostate cancer, NSCLC s/p lobectomy, HTN and GERD presenting with blurry and double vision and left eye ptosis, and admitted with concern for myasthenia gravis.  CMC was seen by his ophthalmologist and sent to ED for further evaluation and management.  CT head without acute finding.  Evaluated by neurology and started on Mestinon.  MRI brain without acute finding.  Clinically improving.  MG labs pending.   Subjective: Seen and examined earlier this morning.  No major events overnight of this morning.  Reports improvement in his symptoms.  Reports some dysphagia.  No more blurry vision or double vision.  Some residual ptosis in left eye.  Patient's daughter at bedside.  Objective: Vitals:   09/12/22 2242 09/13/22 0028 09/13/22 0745 09/13/22 1127  BP:  (!) 160/78 (!) 146/66 (!) 145/83  Pulse:  67 69 74  Resp:  18 15 18   Temp: 98.3 F (36.8 C) 98.1 F (36.7 C) 97.9 F (36.6 C) 97.9 F (36.6 C)  TempSrc: Oral Oral Oral Oral  SpO2:  99% 99% 98%  Weight:    87.2 kg  Height:    6\' 1"  (1.854 m)    Examination:  GENERAL: No apparent distress.  Nontoxic. HEENT: MMM.  Vision and hearing grossly intact.  NECK: Supple.  No apparent JVD.  RESP:  No IWOB.  Fair aeration bilaterally. CVS:  RRR. Heart sounds normal.  ABD/GI/GU: BS+. Abd soft, NTND.  MSK/EXT:  Moves extremities. No apparent deformity. No edema.  SKIN: no apparent skin lesion or wound NEURO: Awake, alert and oriented appropriately.  No apparent focal neuro deficit. PSYCH: Calm. Normal affect.   Procedures:  None  Microbiology summarized: None  Assessment and plan: Principal Problem:   Ptosis  Visual  disturbance/left eye ptosis: Concern for myasthenia gravis.  Recently started amlodipine.  He is also on significant dose of Toprol-XL.  MRI brain without acute finding.  Improved. -Continue Mestinon per neurology. -Continue NIF/VC -Follow MG labs -Neurology on board. -PT/OT/SLP eval  Diet controlled diabetes -Check hemoglobin A1c.  HTN: BP within acceptable range -Continue lisinopril and Toprol-XL. -Hold home amlodipine for now -Hydralazine as needed  NSCLC s/p lobectomy -Patient follow-up  Dysphagia: Patient and daughter reports intermittent difficulty swallowing and choking.  Could be due to #1. -Consult SLP  Hearing loss   Body mass index is 25.36 kg/m.          DVT prophylaxis:  enoxaparin (LOVENOX) injection 40 mg Start: 09/13/22 1000 SCDs Start: 09/12/22 2323  Code Status: Full code Family Communication: Updated patient's daughter at bedside Level of care: Med-Surg Status is: Observation The patient will require care spanning > 2 midnights and should be moved to inpatient because: Due to possible myasthenia gravis   Final disposition: TBD Consultants:  Neurology  55 minutes with more than 50% spent in reviewing records, counseling patient/family and coordinating care.   Sch Meds:  Scheduled Meds:  aspirin  81 mg Oral Daily   enoxaparin (LOVENOX) injection  40 mg Subcutaneous Q24H   lisinopril  20 mg Oral Daily   metoprolol succinate  100 mg Oral Daily   pantoprazole  20 mg Oral Daily   pyridostigmine  30 mg Oral TID  sodium chloride flush  3 mL Intravenous Q12H   Continuous Infusions: PRN Meds:.acetaminophen **OR** acetaminophen, polyethylene glycol, tiZANidine  Antimicrobials: Anti-infectives (From admission, onward)    None        I have personally reviewed the following labs and images: CBC: Recent Labs  Lab 09/12/22 1709 09/13/22 0259  WBC 6.8 6.1  HGB 14.6 13.2  HCT 46.8 40.6  MCV 85.9 85.8  PLT 215 163   BMP &GFR Recent  Labs  Lab 09/12/22 1709 09/13/22 0259  NA 137 137  K 4.7 3.8  CL 105 107  CO2 23 21*  GLUCOSE 122* 144*  BUN 24* 22  CREATININE 1.41* 1.26*  CALCIUM 8.9 8.4*   Estimated Creatinine Clearance: 44.9 mL/min (A) (by C-G formula based on SCr of 1.26 mg/dL (H)). Liver & Pancreas: Recent Labs  Lab 09/13/22 0259  AST 15  ALT 12  ALKPHOS 64  BILITOT 0.6  PROT 5.6*  ALBUMIN 3.0*   No results for input(s): "LIPASE", "AMYLASE" in the last 168 hours. No results for input(s): "AMMONIA" in the last 168 hours. Diabetic: No results for input(s): "HGBA1C" in the last 72 hours. No results for input(s): "GLUCAP" in the last 168 hours. Cardiac Enzymes: No results for input(s): "CKTOTAL", "CKMB", "CKMBINDEX", "TROPONINI" in the last 168 hours. No results for input(s): "PROBNP" in the last 8760 hours. Coagulation Profile: Recent Labs  Lab 09/13/22 0259  INR 1.1   Thyroid Function Tests: No results for input(s): "TSH", "T4TOTAL", "FREET4", "T3FREE", "THYROIDAB" in the last 72 hours. Lipid Profile: No results for input(s): "CHOL", "HDL", "LDLCALC", "TRIG", "CHOLHDL", "LDLDIRECT" in the last 72 hours. Anemia Panel: Recent Labs    09/13/22 0259  VITAMINB12 406   Urine analysis:    Component Value Date/Time   COLORURINE YELLOW 10/24/2020 1010   APPEARANCEUR CLEAR 10/24/2020 1010   LABSPEC 1.014 10/24/2020 1010   PHURINE 5.5 10/24/2020 1010   GLUCOSEU NEGATIVE 10/24/2020 1010   HGBUR MODERATE (A) 10/24/2020 1010   BILIRUBINUR NEGATIVE 10/24/2020 1010   KETONESUR 15 (A) 10/24/2020 1010   PROTEINUR 30 (A) 10/24/2020 1010   UROBILINOGEN 1.0 11/16/2014 0326   NITRITE NEGATIVE 10/24/2020 1010   LEUKOCYTESUR LARGE (A) 10/24/2020 1010   Sepsis Labs: Invalid input(s): "PROCALCITONIN", "LACTICIDVEN"  Microbiology: No results found for this or any previous visit (from the past 240 hour(s)).  Radiology Studies: MR Brain W and Wo Contrast  Result Date: 09/12/2022 CLINICAL DATA:   Vertigo spell 1 week ago followed by blurred and double vision and drooping under left eye. EXAM: MRI HEAD WITHOUT AND WITH CONTRAST TECHNIQUE: Multiplanar, multiecho pulse sequences of the brain and surrounding structures were obtained without and with intravenous contrast. CONTRAST:  7.83mL GADAVIST GADOBUTROL 1 MMOL/ML IV SOLN COMPARISON:  CT head with and without contrast 1 day prior FINDINGS: Brain: There is no acute intracranial hemorrhage, extra-axial fluid collection, or acute infarct Parenchymal volume is within expected limits for age. The ventricles are normal in size. Patchy FLAIR signal abnormality in the supratentorial white matter likely reflects sequela of mild underlying chronic small-vessel ischemic change. There are small remote infarcts in the bilateral cerebellar hemispheres. The pituitary and suprasellar region are normal there is no mass lesion or abnormal enhancement. There is no mass effect or midline shift. Vascular: Normal flow voids. Skull and upper cervical spine: There is no suspicious marrow signal abnormality. There is mild anterolisthesis of C2 on C3. Postsurgical changes at C3-C4 are noted. Sinuses/Orbits: The paranasal sinuses are clear. Bilateral lens implants are  in place. The globes and orbits are otherwise unremarkable. Other: The mastoid air cells and middle ear cavities are clear. IMPRESSION: 1. No acute intracranial pathology or other finding to explain the patient's symptoms. 2. 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 3. On initial injection of intravenous contrast, the patient's IV infiltrative and 7.5 cc contrast was injected. Per the technologist, there was mild swelling at the injection site but no significant pain or neurologic symptoms. Recommend monitoring of the IV site while the patient is in the ED/inpatient, with instructions to return if progressive pain or neurologic symptoms  develop. Electronically Signed   By: Lesia Hausen M.D.   On: 09/12/2022 20:33       T.  [facility]  If 7PM-7AM, please contact night-coverage [email or web address] 09/13/2022, 12:54 PM

## 2022-09-13 NOTE — TOC Initial Note (Signed)
Transition of Care [facility]) - Initial/Assessment Note    Patient Details  Name: Joseph [name] MRN: [id number] Date of Birth: [birth year]  [facility]) CM/SW Contact:    Kermit Balo, RN Phone Number: 09/13/2022, 4:02 PM  Clinical Narrative:                 Pt is from home alone but his daughter is with him during the day Monday-Friday and on weekends if needed. His granddaughter lives beside him.  Pt has DME but doesn't use any currently. Daughter sets his meds out for him and he takes them without any issues.  Daughter provides needed transportation.  TOC following.  Expected Discharge Plan: Home/Self Care Barriers to Discharge: Continued Medical Work up   Patient Goals and CMS Choice            Expected Discharge Plan and Services       Living arrangements for the past 2 months: Single Family Home                                      Prior Living Arrangements/Services Living arrangements for the past 2 months: Single Family Home Lives with:: Self Patient language and need for interpreter reviewed:: Yes Do you feel safe going back to the place where you live?: Yes        Care giver support system in place?: Yes (comment) Current home services: DME (cane/ walker/ shower seat/ wheelchair/ rollator) Criminal Activity/Legal Involvement Pertinent to Current Situation/Hospitalization: No - Comment as needed  Activities of Daily Living Home Assistive Devices/Equipment: None ADL Screening (condition at time of admission) Patient's cognitive ability adequate to safely complete daily activities?: Yes Is the patient deaf or have difficulty hearing?: Yes Does the patient have difficulty seeing, even when wearing glasses/contacts?: Yes Does the patient have difficulty concentrating, remembering, or making decisions?: No Patient able to express need for assistance with ADLs?: No Does the patient have difficulty dressing or bathing?: No Independently  performs ADLs?: Yes (appropriate for developmental age) Does the patient have difficulty walking or climbing stairs?: No Weakness of Legs: None Weakness of Arms/Hands: None  Permission Sought/Granted                  Emotional Assessment Appearance:: Appears stated age     Orientation: : Oriented to Self, Oriented to Place, Oriented to  Time, Oriented to Situation   Psych Involvement: No (comment)  Admission diagnosis:  Diplopia [H53.2] Ptosis [H02.409] Ptosis of left eyelid [H02.402] Patient Active Problem List   Diagnosis Date Noted   Ptosis 09/12/2022   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   Vertigo    Essential hypertension    Iron deficiency anemia    Fracture, proximal femur (HCC) 03/19/2014   Painful orthopaedic hardware (HCC) 01/13/2013   Fall at home 01/18/2012   Hip fracture, left (HCC) 01/18/2012   Femur fracture, left (HCC) 01/18/2012   ESOPHAGEAL MOTILITY DISORDER 11/16/2009   PERSONAL HX PROSTATE CANCER 11/16/2009   DIVERTICULITIS-COLON 08/30/2009   CONSTIPATION 08/30/2009   HYPERTENSION 07/28/2009   GERD 07/28/2009   DIVERTICULOSIS-COLON 07/28/2009   ABDOMINAL PAIN, LEFT UPPER QUADRANT 07/28/2009   ABDOMINAL PAIN, LEFT LOWER QUADRANT 07/28/2009   PERSONAL HX COLONIC POLYPS 07/28/2009   ACHALASIA 03/09/2009   RECTAL BLEEDING 03/09/2009   NONSPECIFIC ABN FINDING RAD & OTH EXAM GI TRACT 02/25/2009   DYSPHAGIA UNSPECIFIED 02/24/2009   PCP:  Manus Gunning,  Molly Maduro, MD Pharmacy:   [facility] [address] [place] - [address] [place] [address] Phone: [phone] Fax: [phone]     Social Determinants of Health (SDOH) Social History: SDOH Screenings   Food Insecurity: No Food Insecurity (09/13/2022)  Housing: Low Risk  (09/13/2022)  Transportation Needs: No Transportation Needs (09/13/2022)  Utilities: Not At Risk (09/13/2022)  Tobacco Use: Low Risk  (09/12/2022)   SDOH Interventions:      Readmission Risk Interventions     No data to display

## 2022-09-13 NOTE — Progress Notes (Signed)
NIF- >-60cmH2O on 3 attempts VC-1.1L, 1.4L, 1.4L with 3 good attempts

## 2022-09-13 NOTE — Care Management Obs Status (Signed)
MEDICARE OBSERVATION STATUS NOTIFICATION   Patient Details  Name: SOUL COMPANION MRN: [id number] Date of Birth: [birth year]   Medicare Observation Status Notification Given:  Yes    Kermit Balo, RN 09/13/2022, 4:01 PM

## 2022-09-13 NOTE — ED Notes (Signed)
ED TO INPATIENT HANDOFF REPORT  ED Nurse Name and Phone #:  RN  S Name/Age/Gender Joseph [name] 89 y.o. male Room/Bed: RESUSC/RESUSC  Code Status   Code Status: Full Code  Home/SNF/Other Home Patient oriented to: self, place, time, and situation Is this baseline? Yes   Triage Complete: Triage complete  Chief Complaint Ptosis [H02.409]  Triage Note Pt came in via POV d/t a week ago on Monday pt had a vertigo spell & took his Meclizine & then Tues he felt ok, then [employer] Fri he said he did not feel good at all. Then Friday (4 days ago) he began having blurred & double vision then the next day on Sat he noticed drooping under his Lt eye. Pt went to see his PCP & an emergent CT was done & nothing of concern was seen. He then saw his eye dr today & was told he needed to come in for eval for an MRI to rule out stroke or Myasthenia Gravis. A/Ox4, denies pain or dizziness, just continues to have blurred/double vision.    Allergies No Known Allergies  Level of Care/Admitting Diagnosis ED Disposition     ED Disposition  Admit   Condition  --   Comment  Hospital Area: [facility] [[id number]]  Level of Care: Med-Surg [16]  May place patient in observation at [facility] or [facility] if equivalent level of care is available:: No  Covid Evaluation: Asymptomatic - no recent exposure (last 10 days) testing not required  Diagnosis: Ptosis [203657]  Admitting Physician: Nolberto Hanlon [[id number]]  Attending Physician: Nolberto Hanlon [[id number]]          B Medical/Surgery History Past Medical History:  Diagnosis Date   Arthritis    Cancer (HCC)    HX PROSTATE CANCER - MANY YRS AGO - ? RADIATION TX. Lung cancer   Diabetes mellitus without complication (HCC)    DIET CONTROLLED   GERD (gastroesophageal reflux disease)    Hearing loss    History of hiatal hernia    History of kidney stones    History of nonunion of fracture    LEFT PROXIMAL FEMUR    Hypertension    Pneumonia    Seasonal allergies    Past Surgical History:  Procedure Laterality Date   CONVERSION TO TOTAL HIP Left 03/19/2014   Procedure: CONVERSION OF PREVIOUS HIP SURGERY TO LEFT TOTAL HIP ARTHROPLASTY;  Surgeon: Loanne Drilling, MD;  Location: [facility];  Service: Orthopedics;  Laterality: Left;   CYSTOSCOPY WITH RETROGRADE PYELOGRAM, URETEROSCOPY AND STENT PLACEMENT Right 10/24/2020   Procedure: CYSTOSCOPY WITH RETROGRADE PYELOGRAM, URETEROSCOPY AND STENT PLACEMENT;  Surgeon: Crist Fat, MD;  Location: [facility];  Service: Urology;  Laterality: Right;   FEMUR IM NAIL  01/18/2012   Procedure: INTRAMEDULLARY (IM) NAIL FEMORAL;  Surgeon: Loanne Drilling, MD;  Location: [facility] OR;  Service: Orthopedics;  Laterality: Left;   HARDWARE REMOVAL Left 01/13/2013   Procedure: HARDWARE REVISION LEFT HIP AND EXCHANGE OF COMPRESSION SCREW;  Surgeon: Loanne Drilling, MD;  Location: [facility];  Service: Orthopedics;  Laterality: Left;   HELLER MYOTOMY     HIATAL HERNIA REPAIR  2011   JOINT REPLACEMENT  12/2011   BIL KNEE REPLACEMENTS   LOBECTOMY Left    left upper lobe   THORACOSCOPY     VIDEO BRONCHOSCOPY WITH ENDOBRONCHIAL NAVIGATION N/A 03/21/2017   Procedure: VIDEO BRONCHOSCOPY WITH ENDOBRONCHIAL NAVIGATION;  Surgeon: Loreli Slot, MD;  Location: [facility] OR;  Service: Thoracic;  Laterality: N/A;     A IV Location/Drains/Wounds Patient Lines/Drains/Airways Status     Active Line/Drains/Airways     Name Placement date Placement time Site Days   Peripheral IV 09/12/22 20 G 1.16" Left Antecubital 09/12/22  1856  Antecubital  1   Ureteral Drain/Stent Right ureter 6 Fr. 10/24/20  1550  Right ureter  689   Airway 10/24/20  1540  -- 689            Intake/Output Last 24 hours No intake or output data in the 24 hours ending 09/13/22 0017  Labs/Imaging Results for orders placed or performed during the hospital encounter of 09/12/22 (from the past 48 hour(s))  CBC      Status: None   Collection Time: 09/12/22  5:09 PM  Result Value Ref Range   WBC 6.8 4.0 - 10.5 K/uL   RBC 5.45 4.22 - 5.81 MIL/uL   Hemoglobin 14.6 13.0 - 17.0 g/dL   HCT 78.2 95.6 - 21.3 %   MCV 85.9 80.0 - 100.0 fL   MCH 26.8 26.0 - 34.0 pg   MCHC 31.2 30.0 - 36.0 g/dL   RDW 08.6 57.8 - 46.9 %   Platelets 215 150 - 400 K/uL   nRBC 0.0 0.0 - 0.2 %    Comment: Performed at [facility], [address] [place], [place] [address]  Basic metabolic panel     Status: Abnormal   Collection Time: 09/12/22  5:09 PM  Result Value Ref Range   Sodium 137 135 - 145 mmol/L   Potassium 4.7 3.5 - 5.1 mmol/L   Chloride 105 98 - 111 mmol/L   CO2 23 22 - 32 mmol/L   Glucose, Bld 122 (H) 70 - 99 mg/dL    Comment: Glucose reference range applies only to samples taken after fasting for at least 8 hours.   BUN 24 (H) 8 - 23 mg/dL   Creatinine, Ser 8.41 (H) 0.61 - 1.24 mg/dL   Calcium 8.9 8.9 - 32.4 mg/dL   GFR, Estimated 48 (L) >60 mL/min    Comment: (NOTE) Calculated using the CKD-EPI Creatinine Equation (2021)    Anion gap 9 5 - 15    Comment: Performed at [facility], [address], [place], [place] [address]   MR Brain W and Wo Contrast  Result Date: 09/12/2022 CLINICAL DATA:  Vertigo spell 1 week ago followed by blurred and double vision and drooping under left eye. EXAM: MRI HEAD WITHOUT AND WITH CONTRAST TECHNIQUE: Multiplanar, multiecho pulse sequences of the brain and surrounding structures were obtained without and with intravenous contrast. CONTRAST:  7.34mL GADAVIST GADOBUTROL 1 MMOL/ML IV SOLN COMPARISON:  CT head with and without contrast 1 day prior FINDINGS: Brain: There is no acute intracranial hemorrhage, extra-axial fluid collection, or acute infarct Parenchymal volume is within expected limits for age. The ventricles are normal in size. Patchy FLAIR signal abnormality in the supratentorial white matter likely reflects sequela of mild underlying chronic small-vessel  ischemic change. There are small remote infarcts in the bilateral cerebellar hemispheres. The pituitary and suprasellar region are normal there is no mass lesion or abnormal enhancement. There is no mass effect or midline shift. Vascular: Normal flow voids. Skull and upper cervical spine: There is no suspicious marrow signal abnormality. There is mild anterolisthesis of C2 on C3. Postsurgical changes at C3-C4 are noted. Sinuses/Orbits: The paranasal sinuses are clear. Bilateral lens implants are in place. The globes and orbits are  otherwise unremarkable. Other: The mastoid air cells and middle ear cavities are clear. IMPRESSION: 1. No acute intracranial pathology or other finding to explain the patient's symptoms. 2. 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 3. On initial injection of intravenous contrast, the patient's IV infiltrative and 7.5 cc contrast was injected. Per the technologist, there was mild swelling at the injection site but no significant pain or neurologic symptoms. Recommend monitoring of the IV site while the patient is in the ED/inpatient, with instructions to return if progressive pain or neurologic symptoms develop. Electronically Signed   By: Lesia Hausen M.D.   On: 09/12/2022 20:33   CT HEAD W & WO CONTRAST ( )  Result Date: 09/11/2022 CLINICAL DATA:  Left eye ptosis. Blurred vision. Symptoms of 1 week duration. EXAM: CT HEAD WITHOUT AND WITH CONTRAST TECHNIQUE: Contiguous axial images were obtained from the base of the skull through the vertex without and with intravenous contrast. RADIATION DOSE REDUCTION: This exam was performed according to the departmental dose-optimization program which includes automated exposure control, adjustment of the mA and/or kV according to patient size and/or use of iterative reconstruction technique. CONTRAST:  75mL ISOVUE-300 IOPAMIDOL (ISOVUE-300) INJECTION 61% COMPARISON:   MRI 11/16/2014 FINDINGS: Brain: Age related volume loss. No focal abnormality seen affecting the brainstem. Mild/minimal chronic small-vessel change of the white matter. No sign of acute infarction, mass lesion, hemorrhage, hydrocephalus or extra-axial collection. After contrast administration, no abnormal enhancement occurs Vascular: There is atherosclerotic calcification of the major vessels at the base of the brain. Skull: Negative Sinuses/Orbits: Clear/normal Other: None IMPRESSION: No acute finding by CT. Age related volume loss. Mild/minimal chronic small-vessel change of the white matter. Electronically Signed   By: Paulina Fusi M.D.   On: 09/11/2022 16:52    Pending Labs Unresulted Labs (From admission, onward)     Start     Ordered   09/19/22 0500  Creatinine, serum  (enoxaparin (LOVENOX)    CrCl >/= 30 ml/min)  Weekly,   R     Comments: while on enoxaparin therapy    09/12/22 2323   09/13/22 0500  Miscellaneous [facility] test (send-out)  Tomorrow morning,   URGENT       Comments: serum Paraneoplastic encephalopathy panel   Question:  Test name / description:  Answer:  serum Paraneoplastic encephalopathy panel   09/12/22 2223   09/13/22 0500  Hepatic function panel  Tomorrow morning,   R        09/12/22 2257   09/13/22 0500  Vitamin B12  Tomorrow morning,   R        09/12/22 2321   09/13/22 0500  APTT  Tomorrow morning,   R        09/12/22 2323   09/13/22 0500  Protime-INR  Tomorrow morning,   R        09/12/22 2323   09/13/22 0500  Basic metabolic panel  Tomorrow morning,   R        09/12/22 2323   09/13/22 0500  CBC  Tomorrow morning,   R        09/12/22 2323   09/12/22 2323  CBC  (enoxaparin (LOVENOX)    CrCl >/= 30 ml/min)  Once,   R       Comments: Baseline for enoxaparin therapy IF NOT ALREADY DRAWN.  Notify MD if PLT < 100 K.    09/12/22 2323   09/12/22 2323  Creatinine, serum  (enoxaparin (LOVENOX)  CrCl >/= 30 ml/min)  Once,   R       Comments: Baseline for  enoxaparin therapy IF NOT ALREADY DRAWN.    09/12/22 2323   09/12/22 2224  Acetylcholine receptor, binding  Once,   URGENT        09/12/22 2223   09/12/22 2224  MuSK Antibodies  Once,   URGENT        09/12/22 2223            Vitals/Pain Today's Vitals   09/12/22 1645 09/12/22 1701 09/12/22 2242  BP: (!) 163/79    Pulse: 78    Temp: 98.2 F (36.8 C)  98.3 F (36.8 C)  TempSrc: Oral  Oral  SpO2: 94%    PainSc:  0-No pain     Isolation Precautions No active isolations  Medications Medications  pyridostigmine (MESTINON) tablet 30 mg (has no administration in time range)  enoxaparin (LOVENOX) injection 40 mg (has no administration in time range)  acetaminophen (TYLENOL) tablet 650 mg (has no administration in time range)    Or  acetaminophen (TYLENOL) suppository 650 mg (has no administration in time range)  polyethylene glycol (MIRALAX / GLYCOLAX) packet 17 g (has no administration in time range)  sodium chloride flush (NS) 0.9 % injection 3 mL (has no administration in time range)  metoprolol succinate (TOPROL-XL) 24 hr tablet 100 mg (has no administration in time range)  lisinopril (ZESTRIL) tablet 20 mg (has no administration in time range)  pantoprazole (PROTONIX) EC tablet 20 mg (has no administration in time range)  aspirin chewable tablet 81 mg (has no administration in time range)  tiZANidine (ZANAFLEX) tablet 4 mg (has no administration in time range)  gadobutrol (GADAVIST) 1 MMOL/ML injection 7.5 mL (7.5 mLs Intravenous Contrast Given 09/12/22 2020)  pyridostigmine (MESTINON) injection 2 mg (2 mg Intravenous Given 09/12/22 2232)    Mobility walks     Focused Assessments Neuro Assessment Handoff:  Swallow screen pass? Yes          Neuro Assessment:   Neuro Checks:      Has TPA been given? No If patient is a Neuro Trauma and patient is going to OR before floor call report to 4N Charge nurse: [phone] or [phone]   R Recommendations: See  Admitting Provider Note  Report given to:   Additional Notes: Pt extremely pleasant, alert and oriented, ambulatory.  Independent.  Daughter at bedside.  Pt has eaten a sandwich down here and snacks.

## 2022-09-13 NOTE — Progress Notes (Signed)
Neurology Progress Note  Brief HPI:  89 y.o. male with PMH significant for DM2, GERD, HTN, hearing loss, NSCLC s/p lobectomy who presents with double vision, blurred vision, ptosis. Patient is somewhat hard of hearing and daughter at bedside assist with history.  Reports that on Friday, they noted the patient had some double vision.  They were going to pharmacy and patient reported seeing double yellow lines on the curb.  On Saturday, they noted that his left eyelid was droopy it was partially covering his left eye.  Now he can barely open his left eye and he has some mild ptosis on his right eye.   Saw opthalmology 8/13 and was directed to the ED for further workup and evaluation for potential stroke and myasthenia gravis.  Patient does endorse that over the last year or 2, he has been coughing and choking on his food, he has been getting more winded easily. He endorses a history of lung cancer and had partial lobectomy in 2019.  Subjective: Patient seen in room with daughter at the bedside. MG patient information given and referral placed for Joseph [name] with [facility]. States his shortness of breath has not changed but his diplopia is improved. Still has left eye ptosis.   Exam: Vitals:   09/12/22 2242 09/13/22 0028  BP:  (!) 160/78  Pulse:  67  Resp:  18  Temp: 98.3 F (36.8 C) 98.1 F (36.7 C)  SpO2:  99%   Gen: In bed, NAD Resp: non-labored breathing, no acute distress Abd: soft, nt  Neuro: Mental Status: AAox4. Speech is clear. Patient is able to give a clear and coherent history. No signs of aphasia or neglect Cranial Nerves: II: Visual Fields are full. PERRL.   III,IV, VI: EOM intact, no gaze preference or deviation, no nystagmus, BL lid ptosis left worse than right with BL upper eyelids encroaching on his pupil  V: Facial sensation is symmetric to temperature VII: Facial movement is symmetric resting and smiling VIII: Hard of hearing at baseline, hearing aides not  available X: Palate elevates symmetrically XI: Shoulder shrug is symmetric. XII: Tongue protrudes midline without atrophy or fasciculations.  Motor: Tone is normal. Bulk is normal. 5/5 strength was present in all four extremities.  Neck Flexion 4+/5 Neck Extension 4+/5 Sensory: Sensation is symmetric to light touch and temperature in the arms and legs. No extinction to DSS present.  Cerebellar: FNF and HKS are intact bilaterally, rapid alternating movements Gait: deferred for safety    Pertinent Labs MuSK Antibodies- in process Acetylcholine receptor- in process Paraneoplastic encephalopathy panel- pending   Imaging Reviewed: CT Head without contrast CTH was negative for a large hypodensity concerning for a large territory infarct or hyperdensity concerning for an ICH   MRI Brain No acute stroke, specifically no brainstem strokes.  Assessment:  Joseph [name] is a 89 y.o. male with PMH significant for DM2, GERD, HTN, hearing loss, NSCLC s/p lobectomy who presents with double vision, blurred vision, ptosis. On further history endorses getting winded easily, nasal tone, choking on food or regurgitating food more recently. Symptoms seem to be concerning for myasthenia gravis, although, not entirely sure of the fatigability of his symptoms and with his hx of lung cancer, lambert eaton syndrome also on differential. He will need myasthenia labs and EMG/NCS to confirm the suspicion for myasthenia. Ambulatory referral to Dr. Allena Katz at [facility] placed.   Impression:  Myasthenia gravis, new diagnosis  Recommendations: - Every 6 hours NIFs and VC to ensure  they are stable. - Recommend Mestinon PO 30mg  TID. If unable to swallow, can switch from PO to IV.(30mg  PO is equivalent to 1mg  IV). - Medications that may worsen or trigger MG exacerbation: Class IA antiarrhythmics, magnesium, flouroquinolones, macrolides, aminoglycosides, penicillamine, curare, interferon alpha, botox, quinine. Use with  caution: calcium channel blocker, beta blockers and statins. - hold off on IVIG as he does not appear to be in florid myasthenia crisis. - AchR ab, MuSK, LRP4, P/Q type VGCC Ab pending - Follow up with Joseph [name] with [facility] for outpatient EMG/NCS.   Patient seen and examined by NP/APP with MD. MD to update note as needed.   Elmer Picker, DNP, FNP-BC [facility] Pager: [phone]   Attending Neurohospitalist Addendum Patient seen and examined with APP/Resident. Agree with the history and physical as documented above. Agree with the plan as documented, which I helped formulate. I have edited the note above to reflect my full findings and recommendations. I have independently reviewed the chart, obtained history, review of systems and examined the patient.I have personally reviewed pertinent head/neck/spine imaging (CT/MRI). Please feel free to call with any questions.  -- Bing Neighbors, MD [facility] [phone]  If 7pm- 7am, please page neurology on call as listed in AMION.

## 2022-09-13 NOTE — Progress Notes (Signed)
NIF- -40/-35/-40 cmH2O (3x attempt) VC- 1.6/1.5/1.5L (3x attempt) Pt gave great effort.

## 2022-09-13 NOTE — Evaluation (Signed)
Clinical/Bedside Swallow Evaluation Patient Details  Name: Joseph [name] MRN: [id number] Date of Birth: 02/20/1931  Today's Date: 09/13/2022 Time: SLP Start Time (ACUTE ONLY): 1420 SLP Stop Time (ACUTE ONLY): 1441 SLP Time Calculation (min) (ACUTE ONLY): 21 min  Past Medical History:  Past Medical History:  Diagnosis Date   Arthritis    Cancer (HCC)    HX PROSTATE CANCER - MANY YRS AGO - ? RADIATION TX. Lung cancer   Diabetes mellitus without complication (HCC)    DIET CONTROLLED   GERD (gastroesophageal reflux disease)    Hearing loss    History of hiatal hernia    History of kidney stones    History of nonunion of fracture    LEFT PROXIMAL FEMUR   Hypertension    Pneumonia    Seasonal allergies    Past Surgical History:  Past Surgical History:  Procedure Laterality Date   CONVERSION TO TOTAL HIP Left 03/19/2014   Procedure: CONVERSION OF PREVIOUS HIP SURGERY TO LEFT TOTAL HIP ARTHROPLASTY;  Surgeon: Loanne Drilling, MD;  Location: [facility];  Service: Orthopedics;  Laterality: Left;   CYSTOSCOPY WITH RETROGRADE PYELOGRAM, URETEROSCOPY AND STENT PLACEMENT Right 10/24/2020   Procedure: CYSTOSCOPY WITH RETROGRADE PYELOGRAM, URETEROSCOPY AND STENT PLACEMENT;  Surgeon: Crist Fat, MD;  Location: [facility];  Service: Urology;  Laterality: Right;   FEMUR IM NAIL  01/18/2012   Procedure: INTRAMEDULLARY (IM) NAIL FEMORAL;  Surgeon: Loanne Drilling, MD;  Location: [facility] OR;  Service: Orthopedics;  Laterality: Left;   HARDWARE REMOVAL Left 01/13/2013   Procedure: HARDWARE REVISION LEFT HIP AND EXCHANGE OF COMPRESSION SCREW;  Surgeon: Loanne Drilling, MD;  Location: [facility];  Service: Orthopedics;  Laterality: Left;   HELLER MYOTOMY     HIATAL HERNIA REPAIR  2011   JOINT REPLACEMENT  12/2011   BIL KNEE REPLACEMENTS   LOBECTOMY Left    left upper lobe   THORACOSCOPY     VIDEO BRONCHOSCOPY WITH ENDOBRONCHIAL NAVIGATION N/A 03/21/2017   Procedure: VIDEO BRONCHOSCOPY WITH ENDOBRONCHIAL  NAVIGATION;  Surgeon: Loreli Slot, MD;  Location: [facility] OR;  Service: Thoracic;  Laterality: N/A;   HPI:  89yo male admitted 09/12/22 with stroke sx, uniocular diplopia. PMH: Arthritis, prostate cancer, DM, GERD, HOh, hiatal hernia, kidney stones, femur fracture, HTN, PNA, seasonal allergies.  MRI = no acute finding, small remote infarcts bilateral cerebellar hemispheres    Assessment / Plan / Recommendation  Clinical Impression  Pt seen at bedside for clinical swallow evaluation. Suspect primary esophageal dysphagia given history and presentation.   RN reports no difficulty with PO meds/liquid. Pt resting in bed, awake and alert. CN exam unremarkable. Pt has upper and lower dentures per daughter. Pt reports globus sensation with intermittent regurgitation with solid textures. Pt tolerated PO trials of thin liquid, puree, and solid consistencies without overt s/s aspiration, including 3oz water challenge.   Recommend the following:  Regular Barium Swallow (esophagram) to assess esophageal motility Eat meals 30-45 minutes after Mestinon dosage Begin meal with warm liquid (broth, hot tea, etc) Sit upright during and 30+ minutes after meals Alternate solids and liquids Take small bites/sips at a slow rate.   SLP will follow up after BaSw results are available to continue education.  SLP Visit Diagnosis: Dysphagia, unspecified (R13.10)    Aspiration Risk  Mild aspiration risk    Diet Recommendation Regular;Thin liquid    Liquid Administration via: Cup;Straw Medication Administration: Whole meds with liquid Supervision: Patient able to self feed Compensations: Minimize environmental  distractions;Slow rate;Small sips/bites;Follow solids with liquid Postural Changes: Seated upright at 90 degrees;Remain upright for at least 30 minutes after po intake    Other  Recommendations Recommended Consults: Consider esophageal assessment Oral Care Recommendations: Oral care BID     Recommendations for follow up therapy are one component of a multi-disciplinary discharge planning process, led by the attending physician.  Recommendations may be updated based on patient status, additional functional criteria and insurance authorization.  Follow up Recommendations Follow physician's recommendations for discharge plan and follow up therapies      Assistance Recommended at Discharge  TBD  Functional Status Assessment Patient has had a recent decline in their functional status and/or demonstrates limited ability to make significant improvements in function in a reasonable and predictable amount of time   Frequency and Duration min 1 x/week  1 week       Prognosis Prognosis for improved oropharyngeal function: Good      Swallow Study   General Date of Onset: 09/12/22 HPI: 89yo male admitted 09/12/22 with stroke sx, uniocular diplopia. PMH: Arthritis, prostate cancer, DM, GERD, HOh, hiatal hernia, kidney stones, femur fracture, HTN, PNA, seasonal allergies.  MRI = no acute finding, small remote infarcts bilateral cerebellar hemispheres Type of Study: Bedside Swallow Evaluation Previous Swallow Assessment: none Diet Prior to this Study: Thin liquids (Level 0);Regular Respiratory Status: Room air History of Recent Intubation: No Behavior/Cognition: Alert;Cooperative;Pleasant mood Oral Cavity Assessment: Within Functional Limits Oral Care Completed by SLP: No Oral Cavity - Dentition: Dentures, bottom;Dentures, top Vision: Functional for self-feeding Self-Feeding Abilities: Able to feed self Patient Positioning: Upright in bed Baseline Vocal Quality: Normal Volitional Cough: Strong Volitional Swallow: Able to elicit    Oral/Motor/Sensory Function Overall Oral Motor/Sensory Function: Within functional limits   Ice Chips Ice chips: Within functional limits Presentation: Spoon   Thin Liquid Thin Liquid: Within functional limits Presentation: Cup;Self Fed;Straw     Nectar Thick Nectar Thick Liquid: Not tested   Honey Thick Honey Thick Liquid: Not tested   Puree Puree: Within functional limits Presentation: Self Fed   Solid     Solid: Within functional limits Presentation: Self Fed      B. Murvin Natal, [facility], CCC-SLP Speech Language Pathologist Office: [phone]  Leigh Aurora 09/13/2022,1:58 PM

## 2022-09-13 NOTE — Progress Notes (Signed)
NIF:  -50 cmH2O with great effort  VC: .9L , 1.1L, 1.0L  with great efforts

## 2022-09-14 ENCOUNTER — Observation Stay (HOSPITAL_COMMUNITY): Payer: PPO

## 2022-09-14 DIAGNOSIS — R739 Hyperglycemia, unspecified: Secondary | ICD-10-CM | POA: Diagnosis not present

## 2022-09-14 DIAGNOSIS — I3139 Other pericardial effusion (noninflammatory): Secondary | ICD-10-CM | POA: Diagnosis not present

## 2022-09-14 DIAGNOSIS — G7000 Myasthenia gravis without (acute) exacerbation: Principal | ICD-10-CM

## 2022-09-14 DIAGNOSIS — J439 Emphysema, unspecified: Secondary | ICD-10-CM | POA: Diagnosis not present

## 2022-09-14 DIAGNOSIS — R1319 Other dysphagia: Secondary | ICD-10-CM

## 2022-09-14 DIAGNOSIS — E119 Type 2 diabetes mellitus without complications: Secondary | ICD-10-CM

## 2022-09-14 DIAGNOSIS — R1310 Dysphagia, unspecified: Secondary | ICD-10-CM | POA: Diagnosis not present

## 2022-09-14 DIAGNOSIS — H02402 Unspecified ptosis of left eyelid: Secondary | ICD-10-CM | POA: Diagnosis not present

## 2022-09-14 LAB — CBC
HCT: 39.5 % (ref 39.0–52.0)
Hemoglobin: 12.9 g/dL — ABNORMAL LOW (ref 13.0–17.0)
MCH: 27.9 pg (ref 26.0–34.0)
MCHC: 32.7 g/dL (ref 30.0–36.0)
MCV: 85.5 fL (ref 80.0–100.0)
Platelets: 174 10*3/uL (ref 150–400)
RBC: 4.62 MIL/uL (ref 4.22–5.81)
RDW: 14.2 % (ref 11.5–15.5)
WBC: 6.4 10*3/uL (ref 4.0–10.5)
nRBC: 0 % (ref 0.0–0.2)

## 2022-09-14 LAB — MAGNESIUM: Magnesium: 2 mg/dL (ref 1.7–2.4)

## 2022-09-14 LAB — RENAL FUNCTION PANEL
Albumin: 2.9 g/dL — ABNORMAL LOW (ref 3.5–5.0)
Anion gap: 7 (ref 5–15)
BUN: 18 mg/dL (ref 8–23)
CO2: 23 mmol/L (ref 22–32)
Calcium: 8.3 mg/dL — ABNORMAL LOW (ref 8.9–10.3)
Chloride: 106 mmol/L (ref 98–111)
Creatinine, Ser: 1.27 mg/dL — ABNORMAL HIGH (ref 0.61–1.24)
GFR, Estimated: 54 mL/min — ABNORMAL LOW (ref >60)
Glucose, Bld: 103 mg/dL — ABNORMAL HIGH (ref 70–99)
Phosphorus: 2.5 mg/dL (ref 2.5–4.6)
Potassium: 3.9 mmol/L (ref 3.5–5.1)
Sodium: 136 mmol/L (ref 135–145)

## 2022-09-14 MED ORDER — LORATADINE 10 MG PO TABS
10.0000 mg | ORAL_TABLET | Freq: Every day | ORAL | Status: DC
  Administered 2022-09-14 – 2022-09-15 (×2): 10 mg via ORAL
  Filled 2022-09-14 (×2): qty 1

## 2022-09-14 MED ORDER — AMLODIPINE BESYLATE 5 MG PO TABS: 5.0000 mg | ORAL_TABLET | Freq: Every day | ORAL | Status: DC

## 2022-09-14 MED ORDER — IPRATROPIUM BROMIDE 0.06 % NA SOLN
1.0000 | Freq: Three times a day (TID) | NASAL | Status: DC | PRN
  Administered 2022-09-14 – 2022-09-15 (×2): 1 via NASAL
  Filled 2022-09-14: qty 15

## 2022-09-14 MED ORDER — METOPROLOL SUCCINATE ER 50 MG PO TB24
50.0000 mg | ORAL_TABLET | Freq: Every day | ORAL | Status: DC
  Administered 2022-09-15: 50 mg via ORAL
  Filled 2022-09-14: qty 1

## 2022-09-14 MED ORDER — IPRATROPIUM BROMIDE 0.02 % IN SOLN: 0.5000 mg | RESPIRATORY_TRACT | Status: DC | PRN

## 2022-09-14 MED ORDER — LISINOPRIL 20 MG PO TABS
40.0000 mg | ORAL_TABLET | Freq: Every day | ORAL | Status: DC
  Administered 2022-09-14 – 2022-09-15 (×2): 40 mg via ORAL
  Filled 2022-09-14 (×2): qty 2

## 2022-09-14 MED ORDER — PANTOPRAZOLE SODIUM 40 MG PO TBEC
40.0000 mg | DELAYED_RELEASE_TABLET | Freq: Every day | ORAL | Status: DC
  Administered 2022-09-15: 40 mg via ORAL
  Filled 2022-09-14: qty 1

## 2022-09-14 MED ORDER — IPRATROPIUM BROMIDE 0.06 % NA SOLN: 2.0000 | NASAL | Status: DC | PRN

## 2022-09-14 MED ORDER — HYDRALAZINE HCL 25 MG PO TABS: 25.0000 mg | ORAL_TABLET | Freq: Four times a day (QID) | ORAL | Status: DC | PRN

## 2022-09-14 NOTE — Progress Notes (Signed)
NIF: -50 cmH20, -55cmH2O, -55 cmH20 with great efforts   VC: .7L, 1.0L, 1.1L with great effort

## 2022-09-14 NOTE — Progress Notes (Signed)
PROGRESS NOTE  PERMAN YAHOLA ZOX:[id number] DOB: 1931/12/28   PCP: Blair Heys, MD  Patient is from: Home.  DOA: 09/12/2022 LOS: 0  Chief complaints Chief Complaint  Patient presents with   Stroke s/s     Brief Narrative / Interim history: 89 year old M with PMH of diet-controlled diabetes, prostate cancer, NSCLC s/p lobectomy, HTN and GERD presenting with blurry and double vision and left eye ptosis, and admitted with concern for myasthenia gravis.  CMC was seen by his ophthalmologist and sent to ED for further evaluation and management.  CT head without acute finding.  Evaluated by neurology and started on Mestinon.  MRI brain without acute finding.  Clinically improved.  MG labs pending.  Neurology following.  Patient has esophageal dysphagia.  Prior history of esophageal dilation and fundoplication.  For esophagram with distal esophageal narrowing, dysmotility and decreased peristalsis.  He is able to swallow solid food without significant problem.  Discussed with [facility] Dr. Rhea Belton and and neurology.  The recommendation is to arrange outpatient to optimize him for myasthenia gravis.   Subjective: Seen and examined earlier this morning.  No major events overnight of this morning.  Reports postnasal drainage causing cough and heaving.  Denies emesis.  No further visual disturbance.  Ptosis improved.  Able to swallow solid food without problem.  Good report on NIF and VC.  Objective: Vitals:   09/13/22 1947 09/14/22 0035 09/14/22 0424 09/14/22 0755  BP: 134/77 (!) 140/91 (!) 158/94 (!) 148/70  Pulse: 89 77 78 72  Resp: 18 18 18 13   Temp: 97.7 F (36.5 C) 97.7 F (36.5 C) 98.2 F (36.8 C) 98.1 F (36.7 C)  TempSrc: Axillary Oral Oral Oral  SpO2: 100% 99% 100% 100%  Weight:      Height:        Examination:  GENERAL: No apparent distress.  Nontoxic. HEENT: MMM.  Vision and hearing grossly intact.  NECK: Supple.  No apparent JVD.  RESP:  No IWOB.  Fair aeration  bilaterally. CVS:  RRR. Heart sounds normal.  ABD/GI/GU: BS+. Abd soft, NTND.  MSK/EXT:  Moves extremities. No apparent deformity. No edema.  SKIN: no apparent skin lesion or wound NEURO: Awake, alert and oriented appropriately.  No apparent focal neuro deficit. PSYCH: Calm. Normal affect.   Procedures:  None  Microbiology summarized: None  Assessment and plan: Principal Problem:   Ptosis  Visual disturbance/left eye ptosis: Concern for myasthenia gravis.  Other differential include Lambert-Eaton syndrome given history of NSCLC.  Recently started amlodipine.  He is also on significant dose of Toprol-XL.  MRI brain without acute finding.  Improved. -Continue Mestinon per neurology. -Continue NIF/VC-good effort so far. -Follow MG labs -Neurology on board. -PT/OT  HTN: BP within acceptable range.  He was on Toprol-XL and recently started on amlodipine.   -Discontinue amlodipine and weaning off Toprol-XL given concern for myasthenia gravis.  Decrease Toprol XL to 50 mg daily.  From what I can tell, he takes Toprol-XL for hypertension.  He has no history of arrhythmia or specific indication for beta-blocker.  -Increase lisinopril from 20 to 40 mg daily.  Monitor renal function closely. -P.o. hydralazine as needed.  May consider scheduling.  Blood pressure elevated.  CKD-3A: Baseline Cr ranges from 1.2-1.4.  Stable. Recent Labs    09/12/22 1709 09/13/22 0259 09/13/22 2353  BUN 24* 22 18  CREATININE 1.41* 1.26* 1.27*  -Monitor with increased dose of lisinopril.  Expect some rise  Esophageal dysphagia: Patient with prior history  of esophageal dilation and fundoplication.  Also her primary was concern for distal esophageal narrowing and dysmotility proximally.  He has no significant dysphagia and has been tolerating regular diet. Discussed with  GI Dr. Rhea Belton and and neurology.  The recommendation is to arrange outpatient to optimize him for myasthenia gravis. -Increase  Protonix to 40 mg daily -Aspiration precaution.  Upright for meals -Mechanical soft diet  NSCLC s/p lobectomy -Patient follow-up  Diet controlled diabetes: A1c 7.1% (7.0 last year).  Stable blood glucose on BMP. -Monitor glucose with daily lab  Hearing loss: Wears hearing aid.   Body mass index is 25.36 kg/m.          DVT prophylaxis:  enoxaparin (LOVENOX) injection 40 mg Start: 09/13/22 1000 SCDs Start: 09/12/22 2323  Code Status: Full code Family Communication: Updated patient's daughter at bedside Level of care: Med-Surg Status is: Observation The patient will require care spanning > 2 midnights and should be moved to inpatient because: Due to  myasthenia gravis   Final disposition: TBD Consultants:  Neurology  55 minutes with more than 50% spent in reviewing records, counseling patient/family and coordinating care.   Sch Meds:  Scheduled Meds:  aspirin  81 mg Oral Daily   enoxaparin (LOVENOX) injection  40 mg Subcutaneous Q24H   lisinopril  40 mg Oral Daily   [START ON 09/15/2022] metoprolol succinate  50 mg Oral Daily   pantoprazole  20 mg Oral Daily   pyridostigmine  30 mg Oral TID   sodium chloride flush  3 mL Intravenous Q12H   Continuous Infusions: PRN Meds:.acetaminophen **OR** acetaminophen, hydrALAZINE, polyethylene glycol, tiZANidine  Antimicrobials: Anti-infectives (From admission, onward)    None        I have personally reviewed the following labs and images: CBC: Recent Labs  Lab 09/12/22 1709 09/13/22 0259 09/13/22 2353  WBC 6.8 6.1 6.4  HGB 14.6 13.2 12.9*  HCT 46.8 40.6 39.5  MCV 85.9 85.8 85.5  PLT 215 163 174   BMP &GFR Recent Labs  Lab 09/12/22 1709 09/13/22 0259 09/13/22 2353  NA 137 137 136  K 4.7 3.8 3.9  CL 105 107 106  CO2 23 21* 23  GLUCOSE 122* 144* 103*  BUN 24* 22 18  CREATININE 1.41* 1.26* 1.27*  CALCIUM 8.9 8.4* 8.3*  MG  --   --  2.0  PHOS  --   --  2.5   Estimated Creatinine Clearance: 44.6  mL/min (A) (by C-G formula based on SCr of 1.27 mg/dL (H)). Liver & Pancreas: Recent Labs  Lab 09/13/22 0259 09/13/22 2353  AST 15  --   ALT 12  --   ALKPHOS 64  --   BILITOT 0.6  --   PROT 5.6*  --   ALBUMIN 3.0* 2.9*   No results for input(s): "LIPASE", "AMYLASE" in the last 168 hours. No results for input(s): "AMMONIA" in the last 168 hours. Diabetic: Recent Labs    09/13/22 0259  HGBA1C 7.1*   No results for input(s): "GLUCAP" in the last 168 hours. Cardiac Enzymes: Recent Labs  Lab 09/13/22 0259  CKTOTAL 50   No results for input(s): "PROBNP" in the last 8760 hours. Coagulation Profile: Recent Labs  Lab 09/13/22 0259  INR 1.1   Thyroid Function Tests: No results for input(s): "TSH", "T4TOTAL", "FREET4", "T3FREE", "THYROIDAB" in the last 72 hours. Lipid Profile: No results for input(s): "CHOL", "HDL", "LDLCALC", "TRIG", "CHOLHDL", "LDLDIRECT" in the last 72 hours. Anemia Panel: Recent Labs    09/13/22  0259  VITAMINB12 406   Urine analysis:    Component Value Date/Time   COLORURINE YELLOW 10/24/2020 1010   APPEARANCEUR CLEAR 10/24/2020 1010   LABSPEC 1.014 10/24/2020 1010   PHURINE 5.5 10/24/2020 1010   GLUCOSEU NEGATIVE 10/24/2020 1010   HGBUR MODERATE (A) 10/24/2020 1010   BILIRUBINUR NEGATIVE 10/24/2020 1010   KETONESUR 15 (A) 10/24/2020 1010   PROTEINUR 30 (A) 10/24/2020 1010   UROBILINOGEN 1.0 11/16/2014 0326   NITRITE NEGATIVE 10/24/2020 1010   LEUKOCYTESUR LARGE (A) 10/24/2020 1010   Sepsis Labs: Invalid input(s): "PROCALCITONIN", "LACTICIDVEN"  Microbiology: No results found for this or any previous visit (from the past 240 hour(s)).  Radiology Studies: DG ESOPHAGUS W SINGLE CM (SOL OR THIN BA)  Result Date: 09/13/2022 CLINICAL DATA:  Provided history: Dysphagia. Additional history provided: The patient reports globus sensation and intermittent regurgitation of solid foods. History of prior Heller myotomy and hiatal hernia repairs.  EXAM: ESOPHAGUS/BARIUM SWALLOW/TABLET STUDY TECHNIQUE: A single contrast examination was performed using thin liquid barium. The exam was performed by Brayton El, PA-C, and was supervised and interpreted by Dr. Jackey Loge. FLUOROSCOPY: Radiation Exposure Index (as provided by the fluoroscopic device): 8.10 mGy Kerma COMPARISON:  Prior esophograms 12/29/2009 and earlier. CT abdomen/pelvis 10/24/2020. FINDINGS: Narrowed appearance of the distal esophagus. A swallowed 13 mm barium tablet did not pass beyond the distal esophagus despite a prolonged period of observation, and despite the patient taking additional water and liquid barium contrast. The esophagus is otherwise patulous. Esophageal dysmotility with decreased peristalsis and intermittent tertiary contractions. Small to moderate-sized recurrent hiatal hernia. IMPRESSION: 1. Narrowed appearance of the distal esophagus. This may reflect postoperative changes from prior fundoplication, a stricture and/or recurrent achalasia. A swallowed 13 mm barium tablet did not pass beyond the distal esophagus despite a prolonged period of observation, and despite the patient taking additional water and liquid barium contrast. 2. The esophagus is patulous elsewhere. Esophageal dysmotility with intermittent tertiary contractions and overall decreased peristalsis. Electronically Signed   By: Jackey Loge D.O.   On: 09/13/2022 16:34       T.  [facility]  If 7PM-7AM, please contact night-coverage [email or web address] 09/14/2022, 10:53 AM

## 2022-09-14 NOTE — TOC Progression Note (Signed)
Transition of Care [facility]) - Progression Note    Patient Details  Name: SPARROW UHLENHAKE MRN: [id number] Date of Birth: 1931-07-02  [facility] of [facility] CM/SW Contact  Kermit Balo, RN Phone Number: 09/14/2022, 4:02 PM  Clinical Narrative:    Outpatient therapy arranged through [facility]. Information on the AVS.  Family will transport home when medically ready.   Expected Discharge Plan: Home/Self Care Barriers to Discharge: Continued Medical Work up  Expected Discharge Plan and Services       Living arrangements for the past 2 months: Single Family Home                                       Social Determinants of Health (SDOH) Interventions SDOH Screenings   Food Insecurity: No Food Insecurity (09/13/2022)  Housing: Low Risk  (09/13/2022)  Transportation Needs: No Transportation Needs (09/13/2022)  Utilities: Not At Risk (09/13/2022)  Tobacco Use: Low Risk  (09/12/2022)    Readmission Risk Interventions     No data to display

## 2022-09-14 NOTE — Evaluation (Signed)
Physical Therapy Evaluation  Patient Details Name: Joseph [name] MRN: [id number] DOB: Dec 13, 1931 Today's Date: 09/14/2022  History of Present Illness  Pt is an 89 y/o male admitted 09/12/22 with stroke sx, uniocular diplopia. Can barely open his left eye and he has some mild ptosis on his right eye.Differential dx per chart myasthenia gravis or lambert eaton syndrome.  MRI = no acute finding, small remote infarcts bilateral cerebellar hemispheres. PMHx: Arthritis, prostate cancer, DM, HOH (even with hearing aids), hiatal hernia, kidney stones, femur fracture, HTN, PNA, seasonal allergies.   Clinical Impression  Pt admitted with above diagnosis. Pt currently with functional limitations due to the deficits listed below (see PT Problem List). At the time of PT eval pt was able to perform transfers with modified independence and ambulation with gross min guard assist and no AD. Pt scored 15/24 on the DGI indicating he is at a higher risk for falls at this time. Pt will have adequate family support at d/c. Pt will benefit from acute skilled PT to increase their independence and safety with mobility to allow discharge.           If plan is discharge home, recommend the following: A little help with walking and/or transfers;Assistance with cooking/housework;Assist for transportation;Help with stairs or ramp for entrance   Can travel by private vehicle        Equipment Recommendations None recommended by PT  Recommendations for Other Services       Functional Status Assessment Patient has had a recent decline in their functional status and demonstrates the ability to make significant improvements in function in a reasonable and predictable amount of time.     Precautions / Restrictions Precautions Precautions: None Restrictions Weight Bearing Restrictions: No      Mobility  Bed Mobility               General bed mobility comments: Pt was recieved sitting up in the recliner     Transfers Overall transfer level: Independent (No AD) Equipment used: None                    Ambulation/Gait Ambulation/Gait assistance: Contact guard assist Gait Distance (Feet): 400 Feet Assistive device: None Gait Pattern/deviations: Step-through pattern, Decreased stride length, Trunk flexed, Narrow base of support, Decreased weight shift to right, Decreased weight shift to left Gait velocity: Decreased Gait velocity interpretation: 1.31 - 2.62 ft/sec, indicative of limited community ambulator   General Gait Details: Pt with decreased weight shift to both R and L creating a waddling-like gait pattern. Appears unsteady but without overt LOB. Pt with DOE 3/4 at end of gait training.  Stairs Stairs: Yes Stairs assistance: Contact guard assist Stair Management: No rails Number of Stairs: 5 General stair comments: practice stairs in rehab gym as part of the DGI. No railings required but pt appeared unsteady. Hands on guarding provided for safety but no physical assist required.  Wheelchair Mobility     Tilt Bed    Modified [facility] (Stroke Patients Only)       Balance Overall balance assessment: Mild deficits observed, not formally tested                               Standardized Balance Assessment Standardized Balance Assessment : Dynamic Gait Index   Dynamic Gait Index Level Surface: Mild Impairment Change in Gait Speed: Normal Gait with Horizontal Head Turns: Moderate Impairment Gait with Vertical Head  Turns: Moderate Impairment Gait and Pivot Turn: Normal Step Over Obstacle: Moderate Impairment Step Around Obstacles: Moderate Impairment Steps: Normal Total Score: 15       Pertinent Vitals/Pain Pain Assessment Pain Assessment: No/denies pain    Home Living Family/patient expects to be discharged to:: Private residence Living Arrangements: Alone Available Help at Discharge: Family;Available PRN/intermittently Type of Home:  House Home Access: Stairs to enter Entrance Stairs-Rails: Right Entrance Stairs-Number of Steps: 3-landing 3   Home Layout: One level Home Equipment: Agricultural consultant (2 wheels);Shower seat - built in;Cane - single point;BSC/3in1 Additional Comments: one of patient's dtrs stays M-F during the day and on weekends during the day as needed. Grand-daughter lives right next door; pt has not driven for about a week (since his vision symptoms started)    Prior Function Prior Level of Function : Independent/Modified Independent;Driving                     Extremity/Trunk Assessment   Upper Extremity Assessment Upper Extremity Assessment: RUE deficits/detail RUE Deficits / Details: Noted decreased coordination with opposition compared to the L. Pt reports he is L handed. Sensation equal R and L bilaterally RUE Sensation: WNL RUE Coordination: decreased fine motor    Lower Extremity Assessment Lower Extremity Assessment: Overall WFL for tasks assessed;LLE deficits/detail (Bilaterally: 5/5 strength and quads, hamstrings, ankle DF, 4+/5 strength in hip flexors.) LLE Deficits / Details: Pt reports baseline L hip deficits from when he fell off a ladder and broke it in 2013.    Cervical / Trunk Assessment Cervical / Trunk Assessment: Normal;Other exceptions Cervical / Trunk Exceptions: Forward head posture with rounded shoulders  Communication   Communication Communication: Hearing impairment (has Bil hearing aids but still HOH)  Cognition Arousal: Alert Behavior During Therapy: WFL for tasks assessed/performed Overall Cognitive Status: Within Functional Limits for tasks assessed                                          General Comments General comments (skin integrity, edema, etc.): Pt does ambulate "heavy footed" without LOB without challenges presented    Exercises     Assessment/Plan    PT Assessment Patient needs continued PT services  PT Problem List  Decreased strength;Decreased activity tolerance;Decreased balance;Decreased mobility;Decreased knowledge of use of DME;Decreased safety awareness;Decreased knowledge of precautions;Decreased coordination       PT Treatment Interventions DME instruction;Gait training;Stair training;Functional mobility training;Therapeutic activities;Therapeutic exercise;Balance training;Cognitive remediation;Patient/family education    PT Goals (Current goals can be found in the Care Plan section)  Acute Rehab PT Goals Patient Stated Goal: Home today, ASAP PT Goal Formulation: With patient/family Time For Goal Achievement: 09/21/22 Potential to Achieve Goals: Good Additional Goals Additional Goal #1: Pt will score 19 or greater out of 24 on the DGI to indicate a decreased risk for falls.    Frequency Min 1X/week     Co-evaluation               AM-PAC PT "6 Clicks" Mobility  Outcome Measure Help needed turning from your back to your side while in a flat bed without using bedrails?: None Help needed moving from lying on your back to sitting on the side of a flat bed without using bedrails?: None Help needed moving to and from a bed to a chair (including a wheelchair)?: None Help needed standing up from a chair using your  arms (e.g., wheelchair or bedside chair)?: None Help needed to walk in hospital room?: A Little Help needed climbing 3-5 steps with a railing? : A Little 6 Click Score: 22    End of Session Equipment Utilized During Treatment: Gait belt Activity Tolerance: Patient tolerated treatment well Patient left: in chair;with call bell/phone within reach;with family/visitor present Nurse Communication: Mobility status PT Visit Diagnosis: Unsteadiness on feet (R26.81);Pain Pain - part of body:  (back)    Time: 1610-9604 PT Time Calculation (min) (ACUTE ONLY): 17 min   Charges:   PT Evaluation $PT Eval Low Complexity: 1 Low   PT General Charges $$ ACUTE PT VISIT: 1 Visit          Conni Slipper, PT, DPT Acute Rehabilitation Services Secure Chat Preferred Office: [phone]   Marylynn Pearson 09/14/2022, 2:19 PM

## 2022-09-14 NOTE — Progress Notes (Signed)
NIF -30  VC .90 1st Attempt/ 1.08 2nd Attempt With great pt effort.

## 2022-09-14 NOTE — Plan of Care (Signed)

## 2022-09-14 NOTE — Progress Notes (Signed)
Neurology Progress Note  Brief HPI:  89 y.o. male with PMH significant for DM2, GERD, HTN, hearing loss, NSCLC s/p lobectomy who presents with double vision, blurred vision, ptosis. Patient is somewhat hard of hearing and daughter at bedside assist with history.  Reports that on Friday, they noted the patient had some double vision.  They were going to pharmacy and patient reported seeing double yellow lines on the curb.  On Saturday, they noted that his left eyelid was droopy it was partially covering his left eye.  Now he can barely open his left eye and he has some mild ptosis on his right eye.   Saw opthalmology 8/13 and was directed to the ED for further workup and evaluation for potential stroke and myasthenia gravis.  Patient does endorse that over the last year or 2, he has been coughing and choking on his food, he has been getting more winded easily. He endorses a history of lung cancer and had partial lobectomy in 2019.  Subjective: Patient reports significant improvement in ptosis, bulbar strength, head flex/ext astrength on mestinon. He is not SOB at rest and does not get as dyspneic doing activities as he did before. States sx are worse at night. Double vision has resolved since starting mestinon. Ach-R antibodies resulted positive confirming dx of myasthenia gravis.   Exam: Vitals:   09/14/22 1532 09/14/22 1950  BP: 109/64 134/78  Pulse: 77 71  Resp: 17 18  Temp: 98 F (36.7 C) 97.9 F (36.6 C)  SpO2: 99% 98%   Gen: In bed, NAD Resp: non-labored breathing, no acute distress Abd: soft, nt  Neuro: Mental Status: AAox4. Speech is clear. Patient is able to give a clear and coherent history. No signs of aphasia or neglect Cranial Nerves: II: Visual Fields are full. PERRL.   III,IV, VI: EOM intact, no gaze preference or deviation, no nystagmus, BL lid ptosis left worse than right but markedly imrproved from yesterday V: Facial sensation is symmetric to temperature VII:  Facial movement is symmetric resting and smiling VIII: Hard of hearing at baseline, hearing aides not available X: Palate elevates symmetrically XI: Shoulder shrug is symmetric. XII: Tongue protrudes midline without atrophy or fasciculations.  Motor: Tone is normal. Bulk is normal. 5/5 strength was present in all four extremities.  Neck Flexion 5/5 Neck Extension 5/5 Sensory: Sensation is symmetric to light touch and temperature in the arms and legs. No extinction to DSS present.  Cerebellar: FNF and HKS are intact bilaterally, rapid alternating movements Gait: deferred for safety  Pertinent Labs MuSK Antibodies- in process Acetylcholine receptor- positive Paraneoplastic encephalopathy panel- pending   Imaging Reviewed: CT Head without contrast CTH was negative for a large hypodensity concerning for a large territory infarct or hyperdensity concerning for an ICH   MRI Brain No acute stroke, specifically no brainstem strokes.  Assessment:  Joseph [name] is a 89 y.o. male with PMH significant for DM2, GERD, HTN, hearing loss, NSCLC s/p lobectomy who presents with double vision, blurred vision, ptosis. On further history endorses getting winded easily, nasal tone, choking on food or regurgitating food more recently. Ach-R antibodies resulted positive confirming dx of myasthenia gravis. He has improved significantly since starting mestinon and NIFs today and yesterday are normal at -50. There is no evidence he is currently in myasthenic crisis.  - He will need CT chest to r/o thymoma, we will get this done overnight.  - Continue NIFs q 12 hrs - Continue mestinon 30mg  tid - Unless his  exam deteriorates he may be discharged home tomorrow on mestinon 30mg  tid - Medications that may worsen or trigger MG exacerbation: Class IA antiarrhythmics, magnesium, flouroquinolones, macrolides, aminoglycosides, penicillamine, curare, interferon alpha, botox, quinine. Use with caution: calcium channel  blocker, beta blockers and statins. - Follow up with Dr. Jacklynn Ganong with [facility] for outpatient EMG/NCS (referral placed)  Bing Neighbors, MD [facility] [phone]  If 7pm- 7am, please page neurology on call as listed in [facility]ION.

## 2022-09-14 NOTE — Progress Notes (Signed)
Speech Language Pathology Treatment: Dysphagia  Patient Details Name: Joseph [name] MRN: [id number] DOB: 1931-12-31 Today's Date: 09/14/2022 Time: 9563-8756 SLP Time Calculation (min) (ACUTE ONLY): 13 min  Assessment / Plan / Recommendation Clinical Impression  Pt seen with family in room for dysphagia treatment. He had a barium esophagram which revealed "narrowed appearance of the distal esophagus. This may reflect postoperative changes from prior fundoplication, a stricture and/orrecurrent achalasia. A swallowed 13 mm barium tablet did not pass beyond the distal esophagus despite a prolonged period of observation, and despite the patient taking additional water and liquid barium contrast. 2. The esophagus is patulous elsewhere. Esophageal dysmotility with intermittent tertiary contractions and overall decreased peristalsis." Pt drank water via straw and solid texture. He coughed once and had eructation suggestive of known esophageal impairments. Spoke with pt about findings although advised him to speak with MD re: results. Pt stated he did not recall recommendations from initial assessment yesterday from SLP and therapist reviewed with pt including: start meals with warm liquids, alternate liquids and solids, small meals more frequently throughout the day (daughter states he does this at home), take Mestinon 30-45 minutes before meals, stay upright 45 min-1 hour after meals. Daughter states he sometimes feels satiated after only eating a small amount. Educated that if symptoms worsen, they could see GI for evaluation if needed. Recommend he continue regular texture, thin liquids with recommendations. Pt and family voice understanding. No further ST needed.     HPI HPI: 89yo male admitted 09/12/22 with stroke sx, uniocular diplopia. PMH: Arthritis, prostate cancer, DM, GERD, HOh, hiatal hernia, kidney stones, femur fracture, HTN, PNA, seasonal allergies.  MRI = no acute finding, small remote infarcts  bilateral cerebellar hemispheres      SLP Plan  All goals met;Discharge SLP treatment due to (comment)      Recommendations for follow up therapy are one component of a multi-disciplinary discharge planning process, led by the attending physician.  Recommendations may be updated based on patient status, additional functional criteria and insurance authorization.    Recommendations  Diet recommendations: Regular;Thin liquid Liquids provided via: Straw;Cup Medication Administration: Whole meds with liquid Supervision: Patient able to self feed Compensations: Slow rate;Small sips/bites;Follow solids with liquid Postural Changes and/or Swallow Maneuvers: Seated upright 90 degrees;Upright 30-60 min after meal                  Oral care BID     Dysphagia, unspecified (R13.10)     All goals met;Discharge SLP treatment due to (comment)     [name]  09/14/2022, 11:10 AM

## 2022-09-14 NOTE — Progress Notes (Signed)
NIF & VC not done due to patient being off the floor. RT will try again later.

## 2022-09-14 NOTE — Evaluation (Signed)
Occupational Therapy Evaluation Patient Details Name: Joseph [name] MRN: [id number] DOB: 18-Jan-1932 Today's Date: 09/14/2022   History of Present Illness 89 yo male admitted 09/12/22 with stroke sx, uniocular diplopia. Can barely open his left eye and he has some mild ptosis on his right eye.Differential dx per chart myasthenia gravis or lambert eaton syndrome.  MRI = no acute finding, small remote infarcts bilateral cerebellar hemispheres.PMHx: Arthritis, prostate cancer, DM, GERD, HOh, hiatal hernia, kidney stones, femur fracture, HTN, PNA, seasonal allergies.   Clinical Impression   This 89 yo male admitted with above presents to acute OT at an independent level with basic ADLs in his hospital room environment. He has family with him at least during the day M-F, sometimes on weekends, and grand daughter lives next door. No further OT needs, we will sign off.       If plan is discharge home, recommend the following: Assist for transportation    Functional Status Assessment  Patient has not had a recent decline in their functional status  Equipment Recommendations  None recommended by OT       Precautions / Restrictions Precautions Precautions: None Restrictions Weight Bearing Restrictions: No      Mobility Bed Mobility Overal bed mobility: Independent                  Transfers Overall transfer level: Independent (NO AD)                        Balance Overall balance assessment: Mild deficits observed, not formally tested                                         ADL either performed or assessed with clinical judgement   ADL Overall ADL's : Modified independent                                             Vision Baseline Vision/History: 1 Wears glasses (bifocals but does not wear them often (pt reports more for distance than reading)) Ability to See in Adequate Light: 2 Moderately impaired Patient Visual Report:  Blurring of vision Vision Assessment?: Yes Eye Alignment:  (left eye looks like it turns out when patient is looking straight ahead--but pt does not report double vision today) Ocular Range of Motion: Within Functional Limits Tracking/Visual Pursuits: Able to track stimulus in all quads without difficulty Additional Comments: Pt with ptosis of left eye lid--reports gets worse as day goes on; i did show him and his family in room how to tape his glasses if he has double vision again that interferes with function            Pertinent Vitals/Pain Pain Assessment Pain Assessment: No/denies pain     Extremity/Trunk Assessment Upper Extremity Assessment Upper Extremity Assessment: Overall WFL for tasks assessed           Communication Communication Communication: Hearing impairment (has Bil hearing aids)   Cognition Arousal: Alert Behavior During Therapy: WFL for tasks assessed/performed Overall Cognitive Status: Within Functional Limits for tasks assessed  General Comments  Pt does ambulate "heavy footed" without LOB without challenges presented            Home Living Family/patient expects to be discharged to:: Private residence Living Arrangements: Alone Available Help at Discharge: Family;Available PRN/intermittently Type of Home: House Home Access: Stairs to enter [employer] of Steps: 3-landing 3 Entrance Stairs-Rails: Right Home Layout: One level     Bathroom Shower/Tub: Walk-in shower;Door   Bathroom Toilet: Handicapped height     Home Equipment: Agricultural consultant (2 wheels);Shower seat - built in;Cane - single point;BSC/3in1   Additional Comments: one of patient's dtrs stays M-F during the day and on weekends during the day as needed. Grand-daughter lives right next door; pt has not driven for about a week (since his vision symptoms started)      Prior Functioning/Environment Prior Level of  Function : Independent/Modified Independent;Driving                        OT Problem List: Impaired vision/perception         OT Goals(Current goals can be found in the care plan section) Acute Rehab OT Goals Patient Stated Goal: to get back to doing what he likes to do         AM-PAC OT "6 Clicks" Daily Activity     Outcome Measure Help from another person eating meals?: None Help from another person taking care of personal grooming?: None Help from another person toileting, which includes using toliet, bedpan, or urinal?: None Help from another person bathing (including washing, rinsing, drying)?: None Help from another person to put on and taking off regular upper body clothing?: None Help from another person to put on and taking off regular lower body clothing?: None 6 Click Score: 24   End of Session Equipment Utilized During Treatment: Gait belt  Activity Tolerance: Patient tolerated treatment well Patient left: in chair;with call bell/phone within reach;with family/visitor present  OT Visit Diagnosis: Other abnormalities of gait and mobility (R26.89)                Time: 1610-9604 OT Time Calculation (min): 27 min Charges:  OT General Charges $OT Visit: 1 Visit OT Evaluation $OT Eval Moderate Complexity: 1 Mod OT Treatments $Self Care/Home Management : 8-22 mins  Lindon Romp OT Acute Rehabilitation Services Office [phone]    Evette Georges 09/14/2022, 11:58 AM

## 2022-09-14 NOTE — Progress Notes (Signed)
Patient ID: Joseph [name], male   DOB: [birth year], 89 y.o.   MRN: [id number]   Brief [facility] note  Patient with history of achalasia, currently admitted with exacerbation of myasthenia gravis.  Barium swallow done for complaints of dysphagia shows a narrowed appearance of the distal esophagus, swallowed 13 mm tablet did not pass, esophagus otherwise patulous, esophageal dysmotility with decreased peristalsis and intermittent tertiary contractions,  He has had prior endoscopies not in over 10 years and has had prior Botox to the LES.  Patient has been scheduled for an office follow-up appointment with Dr. Yancey Flemings on October 15 at 2 PM.

## 2022-09-15 ENCOUNTER — Other Ambulatory Visit (HOSPITAL_COMMUNITY): Payer: Self-pay

## 2022-09-15 DIAGNOSIS — I10 Essential (primary) hypertension: Secondary | ICD-10-CM | POA: Diagnosis not present

## 2022-09-15 DIAGNOSIS — E119 Type 2 diabetes mellitus without complications: Secondary | ICD-10-CM | POA: Diagnosis not present

## 2022-09-15 DIAGNOSIS — K220 Achalasia of cardia: Secondary | ICD-10-CM

## 2022-09-15 DIAGNOSIS — H02402 Unspecified ptosis of left eyelid: Secondary | ICD-10-CM | POA: Diagnosis not present

## 2022-09-15 LAB — CBC
HCT: 42 % (ref 39.0–52.0)
Hemoglobin: 13.5 g/dL (ref 13.0–17.0)
MCH: 27 pg (ref 26.0–34.0)
MCHC: 32.1 g/dL (ref 30.0–36.0)
MCV: 84 fL (ref 80.0–100.0)
Platelets: 177 10*3/uL (ref 150–400)
RBC: 5 MIL/uL (ref 4.22–5.81)
RDW: 14.2 % (ref 11.5–15.5)
WBC: 6.3 10*3/uL (ref 4.0–10.5)
nRBC: 0 % (ref 0.0–0.2)

## 2022-09-15 LAB — RENAL FUNCTION PANEL
Albumin: 3.2 g/dL — ABNORMAL LOW (ref 3.5–5.0)
Anion gap: 7 (ref 5–15)
BUN: 16 mg/dL (ref 8–23)
CO2: 25 mmol/L (ref 22–32)
Calcium: 9 mg/dL (ref 8.9–10.3)
Chloride: 106 mmol/L (ref 98–111)
Creatinine, Ser: 1.12 mg/dL (ref 0.61–1.24)
GFR, Estimated: >60 mL/min (ref >60)
Glucose, Bld: 116 mg/dL — ABNORMAL HIGH (ref 70–99)
Phosphorus: 2.8 mg/dL (ref 2.5–4.6)
Potassium: 4.4 mmol/L (ref 3.5–5.1)
Sodium: 138 mmol/L (ref 135–145)

## 2022-09-15 LAB — MAGNESIUM: Magnesium: 2.1 mg/dL (ref 1.7–2.4)

## 2022-09-15 MED ORDER — LISINOPRIL 40 MG PO TABS: 40.0000 mg | ORAL_TABLET | Freq: Every day | ORAL | 1 refills | Status: DC

## 2022-09-15 MED ORDER — METOPROLOL SUCCINATE ER 25 MG PO TB24
ORAL_TABLET | ORAL | 0 refills | Status: DC
Start: 2022-09-15 — End: 2022-10-10
  Filled 2022-09-15 (×2): qty 9, 6d supply, fill #0

## 2022-09-15 MED ORDER — HYDRALAZINE HCL 25 MG PO TABS
50.0000 mg | ORAL_TABLET | Freq: Three times a day (TID) | ORAL | 1 refills | Status: DC
  Filled 2022-09-15 (×2): qty 180, 30d supply, fill #0

## 2022-09-15 MED ORDER — PYRIDOSTIGMINE BROMIDE 60 MG PO TABS
30.0000 mg | ORAL_TABLET | Freq: Three times a day (TID) | ORAL | 2 refills | Status: DC
  Filled 2022-09-15 (×2): qty 135, 90d supply, fill #0

## 2022-09-15 MED ORDER — PYRIDOSTIGMINE BROMIDE 60 MG PO TABS: 30.0000 mg | ORAL_TABLET | Freq: Three times a day (TID) | ORAL | 2 refills | Status: DC

## 2022-09-15 MED ORDER — METOPROLOL SUCCINATE ER 25 MG PO TB24: ORAL_TABLET | ORAL | 0 refills | Status: DC

## 2022-09-15 MED ORDER — LISINOPRIL 40 MG PO TABS
40.0000 mg | ORAL_TABLET | Freq: Every day | ORAL | 1 refills | Status: DC
  Filled 2022-09-15 (×2): qty 90, 90d supply, fill #0

## 2022-09-15 MED ORDER — HYDRALAZINE HCL 25 MG PO TABS: 50.0000 mg | ORAL_TABLET | Freq: Three times a day (TID) | ORAL | 1 refills | Status: DC

## 2022-09-15 MED ORDER — PANTOPRAZOLE SODIUM 40 MG PO TBEC: 40.0000 mg | DELAYED_RELEASE_TABLET | Freq: Two times a day (BID) | ORAL | 2 refills | Status: DC

## 2022-09-15 NOTE — Progress Notes (Signed)
Patient discharged home with daughter. Out-pt. Rehab to follow. All AVS reviewed including appointments and medications. Medications sent to [facility] and daughter spoke with the pharmacist there. Hospital volunteer transported patient to main entrance via wheelchair.

## 2022-09-15 NOTE — Discharge Summary (Signed)
Physician Discharge Summary  Joseph [name]:[id number] DOB: 15-Aug-1931 DOA: 09/12/2022  PCP: Blair Heys, MD  Admit date: 09/12/2022 Discharge date: 09/15/2022 Admitted From: Home Disposition: Home Recommendations for Outpatient Follow-up:  Outpatient follow-up with PCP and neurology as below GI to arrange outpatient follow-up for esophageal dysphagia Check blood pressure, CMP and CBC at follow-up Please follow up on the following pending results: MuSK antibodies  Home Health: Outpatient referral to neuro rehabilitation as below Equipment/Devices: None  Discharge Condition: Stable CODE STATUS: Full code  Follow-up Information     Glendale Chard, DO. Call in 1 month(s).   Specialty: Neurology Contact information: [address] [place] [address] [phone]         [facility]. Schedule an appointment as soon as possible for a visit.   Specialty: Rehabilitation Contact information: [address] [place] [address] [phone]        Blair Heys, MD. Schedule an appointment as soon as possible for a visit in 1 week(s).   Specialty: Family Medicine Contact information: [address] [place] [address] [phone]                 Hospital course 89 year old M with PMH of diet-controlled diabetes, prostate cancer, NSCLC s/p lobectomy, HTN and GERD presenting with blurry and double vision and left eye ptosis, and admitted with concern for myasthenia gravis.  CMC was seen by his ophthalmologist and sent to ED for further evaluation and management.   CT head without acute finding.  Evaluated by neurology and started on Mestinon.  MRI brain without acute finding. Ach-R antibodies resulted positive confirming dx of myasthenia gravis. He has improved significantly since starting mestinon.  Has had excellent NIF and VC since day 1.  Cleared for discharge by  neurology on Mestinon 30 mg 3 times daily until outpatient follow-up.  Discontinued amlodipine.  Weaning off Toprol-XL.  Increased lisinopril and added hydralazine for blood pressure control.   Patient has esophageal dysphagia.  Prior history of esophageal dilation and fundoplication.  For esophagram with distal esophageal narrowing, dysmotility and decreased peristalsis.  He is able to swallow solid food without significant problem.  Discussed with [facility] Dr. Rhea Belton and and neurology.  The recommendation is to optimize for myasthenia gravis standpoint and arrange outpatient follow-up.    See individual problem list below for more.   Problems addressed during this hospitalization Principal Problem:   Ptosis Active Problems:   ACHALASIA   ESOPHAGEAL MOTILITY DISORDER   Essential hypertension   Diet-controlled type 2 diabetes mellitus (HCC)   Myasthenia gravis: Presents with visual disturbance/left eye ptosis.  Not in crisis.  New diagnosis.  Recently started amlodipine.  He is also on significant dose of Toprol-XL.  MRI brain without acute finding.  Ach-R antibodies positive confirming myasthenia gravis.  Significant improvement in his symptoms with Mestinon.  -Cleared for discharge by neurology on Mestinon 30 mg 3 times daily until about patient follow-up. -Follow MuSK antibodies. -Ambulatory referral to neurology and therapy ordered.   Essential hypertension: BP within acceptable range.  He was on Toprol-XL and recently started on amlodipine.   -Discontinued amlodipine and weaning off Toprol-XL given myasthenia gravis. -Increase lisinopril from 20 to 40 mg daily.   -Added hydralazine 50 mg 3 times daily -Reassess BP and adjust meds as appropriate -Check renal function in 1 week   CKD-3A: Baseline Cr ranges from 1.2-1.4.  Creatinine improved despite increasing lisinopril.  Recent Labs    09/12/22 1709 09/13/22 0259 09/13/22 2353 09/15/22 0313  BUN 24* 22 18 16   CREATININE 1.41*  1.26* 1.27* 1.12  -Reassess renal function in 1 week    Esophageal dysphagia: Patient with prior history of esophageal dilation and fundoplication.  Also her primary was concern for distal esophageal narrowing and dysmotility proximally.  He has no significant dysphagia and has been tolerating regular diet. Discussed with [facility] Dr. Rhea Belton and and neurology.  The recommendation is to arrange outpatient to optimize him for myasthenia gravis. -Increase Protonix to 40 mg daily -Aspiration precaution.  Upright for meals -GI to arrange outpatient follow-up   NSCLC s/p lobectomy -Patient follow-up   Diet controlled diabetes: A1c 7.1% (7.0 last year).  Stable blood glucose on BMP. -Reassess glycemic control outpatient.   Hearing loss: Wears hearing aid.           Time spent 45 minutes  Vital signs Vitals:   09/14/22 1950 09/14/22 2340 09/15/22 0314 09/15/22 0725  BP: 134/78 (!) 161/93 (!) 164/83 (!) 152/82  Pulse: 71 78 73 75  Temp: 97.9 F (36.6 C) 97.9 F (36.6 C) 97.8 F (36.6 C) 97.8 F (36.6 C)  Resp: 18 16 18    Height:      Weight:      SpO2: 98% 100% 100% 100%  TempSrc: Oral Oral Oral Oral  BMI (Calculated):         Discharge exam  GENERAL: No apparent distress.  Nontoxic. HEENT: MMM.  Vision and hearing grossly intact.  Very subtle ptosis on the left. NECK: Supple.  No apparent JVD.  RESP:  No IWOB.  Fair aeration bilaterally. CVS:  RRR. Heart sounds normal.  ABD/GI/GU: BS+. Abd soft, NTND.  MSK/EXT:  Moves extremities. No apparent deformity. No edema.  SKIN: no apparent skin lesion or wound NEURO: Awake and alert. Oriented appropriately.  No apparent focal neuro deficit. PSYCH: Calm. Normal affect.   Discharge Instructions Discharge Instructions     Ambulatory referral to Neurology   Complete by: As directed    An appointment is requested in approximately: 4 weeks   Ambulatory referral to Physical Therapy   Complete by: As directed    Diet - low  sodium heart healthy   Complete by: As directed    Discharge instructions   Complete by: As directed    It has been a pleasure taking care of you!  You were hospitalized due to myasthenia gravis for which you have been started on medication.  Your symptoms improved.  Take your medications as prescribed.  Please review your new medication list and the directions on your medications before you take them.  Follow-up with your primary care doctor in 1 to 2 weeks or sooner if needed.  Follow-up with neurology per their recommendation.  Gastroenterology will arrange outpatient follow-up for your problem with the swallowing.  Meanwhile, always stay upright and chew your food very well before swallowing.    Take care,   Increase activity slowly   Complete by: As directed       Allergies as of 09/15/2022   No Known Allergies      Medication List     STOP taking these medications    amLODipine 5 MG tablet Commonly known as: NORVASC   ibuprofen 200 MG tablet Commonly known as: ADVIL   meloxicam 15 MG tablet Commonly known as: MOBIC       TAKE these medications    acetaminophen 500 MG tablet Commonly  known as: TYLENOL Take 500-1,000 mg by mouth every 8 (eight) hours as needed for moderate pain or headache.   amoxicillin 500 MG capsule Commonly known as: AMOXIL Take 2,000 mg by mouth See admin instructions. Take 4 capsules (2000 mg) by mouth 1 hours prior to procedure.   aspirin EC 81 MG tablet Take 81 mg by mouth as directed. MWF   diphenhydrAMINE 25 MG tablet Commonly known as: BENADRYL Take 25 mg by mouth daily.   Elderberry Immune Complex Chew Chew 1 tablet by mouth daily.   Gemtesa 75 MG Tabs Generic drug: Vibegron Take 75 mg by mouth daily.   hydrALAZINE 25 MG tablet Commonly known as: APRESOLINE Take 2 tablets (50 mg total) by mouth 3 (three) times daily.   hydrALAZINE 25 MG tablet Commonly known as: APRESOLINE Take 2 tablets (50 mg total) by mouth 3  (three) times daily.   ipratropium 0.06 % nasal spray Commonly known as: ATROVENT Place 1 spray into both nostrils 3 (three) times daily as needed (allergies).   lisinopril 40 MG tablet Commonly known as: ZESTRIL Take 1 tablet (40 mg total) by mouth daily. What changed:  medication strength how much to take   lisinopril 40 MG tablet Commonly known as: ZESTRIL Take 1 tablet (40 mg total) by mouth daily. What changed: You were already taking a medication with the same name, and this prescription was added. Make sure you understand how and when to take each.   loratadine 10 MG tablet Commonly known as: CLARITIN Take 10 mg by mouth daily.   meclizine 25 MG tablet Commonly known as: ANTIVERT Take 25 mg by mouth 3 (three) times daily as needed for dizziness.   metoprolol succinate 25 MG 24 hr tablet Commonly known as: Toprol XL Take 2 tablets (50 mg total) by mouth daily for 3 days, THEN 1 tablet (25 mg total) daily for 3 days. Start taking on: September 15, 2022 What changed:  medication strength See the new instructions.   metoprolol succinate 25 MG 24 hr tablet Commonly known as: Toprol XL Take 2 tablets (50 mg total) by mouth daily for 3 days, THEN 1 tablet (25 mg total) daily for 3 days. Start taking on: September 15, 2022 What changed: You were already taking a medication with the same name, and this prescription was added. Make sure you understand how and when to take each.   Multivitamin Adults Tabs Take 1 tablet by mouth daily.   ondansetron 4 MG tablet Commonly known as: ZOFRAN Take 4 mg by mouth every 8 (eight) hours as needed for nausea or vomiting.   pantoprazole 40 MG tablet Commonly known as: PROTONIX Take 1 tablet (40 mg total) by mouth 2 (two) times daily. What changed: when to take this   pyridostigmine 60 MG tablet Commonly known as: MESTINON Take 1/2 tablet (30 mg total) by mouth 3 (three) times daily.   pyridostigmine 60 MG tablet Commonly known as:  MESTINON Take 1/2 tablet (30 mg total) by mouth 3 (three) times daily.   VITAMIN C PO Take 1 packet by mouth daily.        Consultations: Neurology Gastroenterology  Procedures/Studies:   CT CHEST WO CONTRAST  Result Date: 09/15/2022 CLINICAL DATA:  Newly diagnosed myasthenia gravis, evaluate for thymoma EXAM: CT CHEST WITHOUT CONTRAST TECHNIQUE: Multidetector CT imaging of the chest was performed following the standard protocol without IV contrast. RADIATION DOSE REDUCTION: This exam was performed according to the departmental dose-optimization program which includes automated exposure control,  adjustment of the mA and/or kV according to patient size and/or use of iterative reconstruction technique. COMPARISON:  Chest radiograph dated 10/24/2020. CT chest dated 02/28/2017. FINDINGS: Cardiovascular: Mild cardiomegaly.  Small pericardial effusion. No evidence of thoracic aortic aneurysm. Atherosclerotic calcifications of the aortic arch. Moderate three-vessel coronary atherosclerosis. Mediastinum/Nodes: No anterior mediastinal soft tissue lesion/mass to suggest a thymoma. Small mediastinal nodes, including a dominant 8 mm short axis subcarinal node (series 3/image 31), chronic. Visualized thyroid is unremarkable. Lungs/Pleura: Status post left upper lobectomy. Mild subpleural scarring in the left lower lobe. Mild dependent atelectasis in the right lower lobe. No focal consolidation. Mild centrilobular firmness changes, right upper lobe predominant. No pleural effusion or pneumothorax. Upper Abdomen: Moderate hiatal hernia with mildly irregular left lateral wall thickening (series 3/image 88), poorly evaluated. Extensive colonic diverticulosis. Scattered bilateral renal cysts measuring up to 9.0 cm in the right upper kidney (series 3/image 145), measuring simple fluid density, benign. No follow-up is recommended. Vascular calcifications. Musculoskeletal: Cervical spine fixation hardware,  incompletely visualized. Mild degenerative changes of the mid/lower thoracic spine. IMPRESSION: No evidence of thymoma. Status post left upper lobectomy. No evidence of recurrent or metastatic disease. Moderate hiatal hernia with mildly irregular left lateral wall thickening, poorly evaluated. Consider endoscopy for further evaluation. Aortic Atherosclerosis (ICD10-I70.0) and Emphysema (ICD10-J43.9). Electronically Signed   By: Charline Bills M.D.   On: 09/15/2022 00:30   DG ESOPHAGUS W SINGLE CM (SOL OR THIN BA)  Result Date: 09/13/2022 CLINICAL DATA:  Provided history: Dysphagia. Additional history provided: The patient reports globus sensation and intermittent regurgitation of solid foods. History of prior Heller myotomy and hiatal hernia repairs. EXAM: ESOPHAGUS/BARIUM SWALLOW/TABLET STUDY TECHNIQUE: A single contrast examination was performed using thin liquid barium. The exam was performed by Brayton El, PA-C, and was supervised and interpreted by Dr. Jackey Loge. FLUOROSCOPY: Radiation Exposure Index (as provided by the fluoroscopic device): 8.10 mGy Kerma COMPARISON:  Prior esophograms 12/29/2009 and earlier. CT abdomen/pelvis 10/24/2020. FINDINGS: Narrowed appearance of the distal esophagus. A swallowed 13 mm barium tablet did not pass beyond the distal esophagus despite a prolonged period of observation, and despite the patient taking additional water and liquid barium contrast. The esophagus is otherwise patulous. Esophageal dysmotility with decreased peristalsis and intermittent tertiary contractions. Small to moderate-sized recurrent hiatal hernia. IMPRESSION: 1. Narrowed appearance of the distal esophagus. This may reflect postoperative changes from prior fundoplication, a stricture and/or recurrent achalasia. A swallowed 13 mm barium tablet did not pass beyond the distal esophagus despite a prolonged period of observation, and despite the patient taking additional water and liquid barium  contrast. 2. The esophagus is patulous elsewhere. Esophageal dysmotility with intermittent tertiary contractions and overall decreased peristalsis. Electronically Signed   By: Jackey Loge D.O.   On: 09/13/2022 16:34   MR Brain W and Wo Contrast  Result Date: 09/12/2022 CLINICAL DATA:  Vertigo spell 1 week ago followed by blurred and double vision and drooping under left eye. EXAM: MRI HEAD WITHOUT AND WITH CONTRAST TECHNIQUE: Multiplanar, multiecho pulse sequences of the brain and surrounding structures were obtained without and with intravenous contrast. CONTRAST:  7.77mL GADAVIST GADOBUTROL 1 MMOL/ML IV SOLN COMPARISON:  CT head with and without contrast 1 day prior FINDINGS: Brain: There is no acute intracranial hemorrhage, extra-axial fluid collection, or acute infarct Parenchymal volume is within expected limits for age. The ventricles are normal in size. Patchy FLAIR signal abnormality in the supratentorial white matter likely reflects sequela of mild underlying chronic small-vessel ischemic change. 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 The pituitary and suprasellar region are normal there is no mass lesion or abnormal enhancement. There is no mass effect or midline shift. Vascular: Normal flow voids. Skull and upper cervical spine: There is no suspicious marrow signal abnormality. There is mild anterolisthesis of C2 on C3. Postsurgical changes at C3-C4 are noted. Sinuses/Orbits: The paranasal sinuses are clear. Bilateral lens implants are in place. The globes and orbits are otherwise unremarkable. Other: The mastoid air cells and middle ear cavities are clear. IMPRESSION: 1. No acute intracranial pathology or other finding to explain the patient's symptoms. 2. 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 3. On initial injection of intravenous contrast, the patient's IV infiltrative and 7.5 cc  contrast was injected. Per the technologist, there was mild swelling at the injection site but no significant pain or neurologic symptoms. Recommend monitoring of the IV site while the patient is in the ED/inpatient, with instructions to return if progressive pain or neurologic symptoms develop. Electronically Signed   By: Lesia Hausen M.D.   On: 09/12/2022 20:33   CT HEAD W & WO CONTRAST ( )  Result Date: 09/11/2022 CLINICAL DATA:  Left eye ptosis. Blurred vision. Symptoms of 1 week duration. EXAM: CT HEAD WITHOUT AND WITH CONTRAST TECHNIQUE: Contiguous axial images were obtained from the base of the skull through the vertex without and with intravenous contrast. RADIATION DOSE REDUCTION: This exam was performed according to the departmental dose-optimization program which includes automated exposure control, adjustment of the mA and/or kV according to patient size and/or use of iterative reconstruction technique. CONTRAST:  75mL ISOVUE-300 IOPAMIDOL (ISOVUE-300) INJECTION 61% COMPARISON:  MRI 11/16/2014 FINDINGS: Brain: Age related volume loss. No focal abnormality seen affecting the brainstem. Mild/minimal chronic small-vessel change of the white matter. No sign of acute infarction, mass lesion, hemorrhage, hydrocephalus or extra-axial collection. After contrast administration, no abnormal enhancement occurs Vascular: There is atherosclerotic calcification of the major vessels at the base of the brain. Skull: Negative Sinuses/Orbits: Clear/normal Other: None IMPRESSION: No acute finding by CT. Age related volume loss. Mild/minimal chronic small-vessel change of the white matter. Electronically Signed   By: Paulina Fusi M.D.   On: 09/11/2022 16:52       The results of significant diagnostics from this hospitalization (including imaging, microbiology, ancillary and laboratory) are listed below for reference.     Microbiology: No results found for this or any previous visit (from the past 240  hour(s)).   Labs:  CBC: Recent Labs  Lab 09/12/22 1709 09/13/22 0259 09/13/22 2353 09/15/22 0313  WBC 6.8 6.1 6.4 6.3  HGB 14.6 13.2 12.9* 13.5  HCT 46.8 40.6 39.5 42.0  MCV 85.9 85.8 85.5 84.0  PLT 215 163 174 177   BMP &GFR Recent Labs  Lab 09/12/22 1709 09/13/22 0259 09/13/22 2353 09/15/22 0313  NA 137 137 136 138  K 4.7 3.8 3.9 4.4  CL 105 107 106 106  CO2 23 21* 23 25  GLUCOSE 122* 144* 103* 116*  BUN 24* 22 18 16   CREATININE 1.41* 1.26* 1.27* 1.12  CALCIUM 8.9 8.4* 8.3* 9.0  MG  --   --  2.0 2.1  PHOS  --   --  2.5 2.8   Estimated Creatinine Clearance: 50.5 mL/min (by C-G formula based on SCr of 1.12 mg/dL). Liver & Pancreas: Recent Labs  Lab 09/13/22 0259 09/13/22 2353 09/15/22 0313  AST 15  --   --  ALT 12  --   --   ALKPHOS 64  --   --   BILITOT 0.6  --   --   PROT 5.6*  --   --   ALBUMIN 3.0* 2.9* 3.2*   No results for input(s): "LIPASE", "AMYLASE" in the last 168 hours. No results for input(s): "AMMONIA" in the last 168 hours. Diabetic: Recent Labs    09/13/22 0259  HGBA1C 7.1*   No results for input(s): "GLUCAP" in the last 168 hours. Cardiac Enzymes: Recent Labs  Lab 09/13/22 0259  CKTOTAL 50   No results for input(s): "PROBNP" in the last 8760 hours. Coagulation Profile: Recent Labs  Lab 09/13/22 0259  INR 1.1   Thyroid Function Tests: No results for input(s): "TSH", "T4TOTAL", "FREET4", "T3FREE", "THYROIDAB" in the last 72 hours. Lipid Profile: No results for input(s): "CHOL", "HDL", "LDLCALC", "TRIG", "CHOLHDL", "LDLDIRECT" in the last 72 hours. Anemia Panel: Recent Labs    09/13/22 0259  VITAMINB12 406   Urine analysis:    Component Value Date/Time   COLORURINE YELLOW 10/24/2020 1010   APPEARANCEUR CLEAR 10/24/2020 1010   LABSPEC 1.014 10/24/2020 1010   PHURINE 5.5 10/24/2020 1010   GLUCOSEU NEGATIVE 10/24/2020 1010   HGBUR MODERATE (A) 10/24/2020 1010   BILIRUBINUR NEGATIVE 10/24/2020 1010   KETONESUR 15 (A)  10/24/2020 1010   PROTEINUR 30 (A) 10/24/2020 1010   UROBILINOGEN 1.0 11/16/2014 0326   NITRITE NEGATIVE 10/24/2020 1010   LEUKOCYTESUR LARGE (A) 10/24/2020 1010   Sepsis Labs: Invalid input(s): "PROCALCITONIN", "LACTICIDVEN"   SIGNED:  Almon Hercules, MD  [facility] 09/15/2022, 3:24 PM

## 2022-09-15 NOTE — Progress Notes (Signed)
NIF & VC not done pt sleeping at this time.

## 2022-09-15 NOTE — TOC Transition Note (Signed)
Transition of Care [facility]) - CM/SW Discharge Note   Patient Details  Name: Joseph [name] MRN: [id number] Date of Birth: 25-Mar-1931  [facility] CM/SW Contact:  Kermit Balo, RN Phone Number: 09/15/2022, 11:16 AM   Clinical Narrative:    Pt is discharging home with outpatient therapy through [facility]. Information on the AVS.  Family to provide transport home.   Final next level of care: OP Rehab Barriers to Discharge: No Barriers Identified   Patient Goals and CMS Choice CMS Medicare.gov Compare Post Acute Care list provided to:: Patient Represenative (must comment) Choice offered to / list presented to : Adult Children  Discharge Placement                         Discharge Plan and Services Additional resources added to the After Visit Summary for                                       Social Determinants of Health (SDOH) Interventions SDOH Screenings   Food Insecurity: No Food Insecurity (09/13/2022)  Housing: Low Risk  (09/13/2022)  Transportation Needs: No Transportation Needs (09/13/2022)  Utilities: Not At Risk (09/13/2022)  Tobacco Use: Low Risk  (09/12/2022)     Readmission Risk Interventions     No data to display

## 2022-09-15 NOTE — Progress Notes (Signed)
Pt NIF and VC as follows  NIF> -40 with great effort VC 1.2L

## 2022-09-15 NOTE — Plan of Care (Signed)

## 2022-09-18 ENCOUNTER — Encounter: Payer: Self-pay | Admitting: Neurology

## 2022-09-19 ENCOUNTER — Ambulatory Visit: Payer: PPO | Admitting: Internal Medicine

## 2022-09-22 DIAGNOSIS — R1310 Dysphagia, unspecified: Secondary | ICD-10-CM | POA: Diagnosis not present

## 2022-09-22 DIAGNOSIS — H02409 Unspecified ptosis of unspecified eyelid: Secondary | ICD-10-CM | POA: Diagnosis not present

## 2022-09-22 DIAGNOSIS — I10 Essential (primary) hypertension: Secondary | ICD-10-CM | POA: Diagnosis not present

## 2022-09-22 DIAGNOSIS — G7000 Myasthenia gravis without (acute) exacerbation: Secondary | ICD-10-CM | POA: Diagnosis not present

## 2022-09-27 ENCOUNTER — Ambulatory Visit: Payer: PPO | Attending: Student

## 2022-09-27 DIAGNOSIS — G7000 Myasthenia gravis without (acute) exacerbation: Secondary | ICD-10-CM | POA: Diagnosis not present

## 2022-09-27 DIAGNOSIS — M6281 Muscle weakness (generalized): Secondary | ICD-10-CM | POA: Diagnosis not present

## 2022-09-27 DIAGNOSIS — R2681 Unsteadiness on feet: Secondary | ICD-10-CM | POA: Diagnosis not present

## 2022-09-27 DIAGNOSIS — R262 Difficulty in walking, not elsewhere classified: Secondary | ICD-10-CM | POA: Diagnosis not present

## 2022-09-27 NOTE — Therapy (Signed)
OUTPATIENT PHYSICAL THERAPY NEURO EVALUATION   Patient Name: Joseph [name] MRN: [id number] DOB:September 10, 1931, 89 y.o., male Today's Date: 09/27/2022   PCP: Blair Heys, MD REFERRING PROVIDER: Candelaria Stagers, MD  END OF SESSION:  PT End of Session - 09/27/22 0843     Visit Number 1    Number of Visits 1    Authorization Type HTA    PT Start Time 0844    PT Stop Time 0910    PT Time Calculation (min) 26 min    Activity Tolerance Patient limited by fatigue    Behavior During Therapy WFL for tasks assessed/performed             Past Medical History:  Diagnosis Date   Arthritis    Cancer (HCC)    HX PROSTATE CANCER - MANY YRS AGO - ? RADIATION TX. Lung cancer   Diabetes mellitus without complication (HCC)    DIET CONTROLLED   GERD (gastroesophageal reflux disease)    Hearing loss    History of hiatal hernia    History of kidney stones    History of nonunion of fracture    LEFT PROXIMAL FEMUR   Hypertension    Pneumonia    Seasonal allergies    Past Surgical History:  Procedure Laterality Date   CONVERSION TO TOTAL HIP Left 03/19/2014   Procedure: CONVERSION OF PREVIOUS HIP SURGERY TO LEFT TOTAL HIP ARTHROPLASTY;  Surgeon: Loanne Drilling, MD;  Location: [facility];  Service: Orthopedics;  Laterality: Left;   CYSTOSCOPY WITH RETROGRADE PYELOGRAM, URETEROSCOPY AND STENT PLACEMENT Right 10/24/2020   Procedure: CYSTOSCOPY WITH RETROGRADE PYELOGRAM, URETEROSCOPY AND STENT PLACEMENT;  Surgeon: Crist Fat, MD;  Location: [facility];  Service: Urology;  Laterality: Right;   FEMUR IM NAIL  01/18/2012   Procedure: INTRAMEDULLARY (IM) NAIL FEMORAL;  Surgeon: Loanne Drilling, MD;  Location: [facility];  Service: Orthopedics;  Laterality: Left;   HARDWARE REMOVAL Left 01/13/2013   Procedure: HARDWARE REVISION LEFT HIP AND EXCHANGE OF COMPRESSION SCREW;  Surgeon: Loanne Drilling, MD;  Location: [facility];  Service: Orthopedics;  Laterality: Left;   HELLER MYOTOMY     HIATAL HERNIA REPAIR   2011   JOINT REPLACEMENT  12/2011   BIL KNEE REPLACEMENTS   LOBECTOMY Left    left upper lobe   THORACOSCOPY     VIDEO BRONCHOSCOPY WITH ENDOBRONCHIAL NAVIGATION N/A 03/21/2017   Procedure: VIDEO BRONCHOSCOPY WITH ENDOBRONCHIAL NAVIGATION;  Surgeon: Loreli Slot, MD;  Location: [facility];  Service: Thoracic;  Laterality: N/A;   Patient Active Problem List   Diagnosis Date Noted   Diet-controlled type 2 diabetes mellitus (HCC) 09/14/2022   Ptosis 09/12/2022   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   Vertigo    Essential hypertension    Iron deficiency anemia    Fracture, proximal femur (HCC) 03/19/2014   Painful orthopaedic hardware (HCC) 01/13/2013   Fall at home 01/18/2012   Hip fracture, left (HCC) 01/18/2012   Femur fracture, left (HCC) 01/18/2012   ESOPHAGEAL MOTILITY DISORDER 11/16/2009   PERSONAL HX PROSTATE CANCER 11/16/2009   DIVERTICULITIS-COLON 08/30/2009   CONSTIPATION 08/30/2009   HYPERTENSION 07/28/2009   GERD 07/28/2009   DIVERTICULOSIS-COLON 07/28/2009   ABDOMINAL PAIN, LEFT UPPER QUADRANT 07/28/2009   ABDOMINAL PAIN, LEFT LOWER QUADRANT 07/28/2009   PERSONAL HX COLONIC POLYPS 07/28/2009   ACHALASIA 03/09/2009   RECTAL BLEEDING 03/09/2009   NONSPECIFIC ABN FINDING RAD & OTH EXAM GI TRACT 02/25/2009   DYSPHAGIA UNSPECIFIED 02/24/2009    ONSET DATE: 09/08/22  REFERRING DIAG: G70.00 (ICD-10-CM) - Myasthenia gravis (HCC)   THERAPY DIAG:  Difficulty in walking, not elsewhere classified  Muscle weakness (generalized)  Unsteadiness on feet  Rationale for Evaluation and Treatment: Rehabilitation  SUBJECTIVE:                                                                                                                                                                                             SUBJECTIVE STATEMENT: Patient arrives to clinic with daughter, no AD, but needing assist to get to mat table. Patient voicing frustration that his eyelids are drooping.  Initially it was the L eyelid, now it's both. Exacerbation started August 9. Did at one point have difficulty with breathing and swallowing. Denies any difficulty presently. Has a neurology appt set up for 2 weeks from now. Not presently established with any other neurologist.  Pt accompanied by: family member  PERTINENT HISTORY: diet-controlled diabetes, prostate cancer, NSCLC s/p lobectomy, HTN and GERD  PAIN:  Are you having pain? No  PRECAUTIONS: Other: MG  RED FLAGS: None   WEIGHT BEARING RESTRICTIONS: No  FALLS: Has patient fallen in last 6 months? No  PATIENT GOALS: to hold open his eyes  OBJECTIVE:   TRANSFERS: Assistive device utilized: None  Sit to stand: Min A Stand to sit: Min A Chair to chair: Min A GAIT: Gait pattern: step through pattern, decreased arm swing- Right, decreased arm swing- Left, decreased stride length, decreased hip/knee flexion- Right, decreased hip/knee flexion- Left, Right foot flat, Left foot flat, decreased trunk rotation, wide BOS, poor foot clearance- Right, and poor foot clearance- Left Distance walked: ~67ft Assistive device utilized: None Level of assistance: Min A Comments: unable to keep his eyes open    TODAY'S TREATMENT:                                                                                                                              Provided ed on MG, energy conservation, wallet card, yellow/red flags, PT implications   PATIENT EDUCATION: Education details: see above, PT POC Person educated: Patient and  Child(ren) Education method: Explanation and Handouts Education comprehension: verbalized understanding  HOME EXERCISE PROGRAM: Not indicated  GOALS: Not indicated as patient is not appropriate for OP PT at this time.   ASSESSMENT:  CLINICAL IMPRESSION: Patient is an 89 y.o. male who was seen today for physical therapy evaluation and treatment for myasthenia gravis exacerbation. Currently, patient must rest ~5  mins before he is able to open both his eyes to a functional amount and then he can only hold them open for ~10s before they close again. He is unsafe ambulating due to this and when his eyes are open, he reports blurry vision. He has been on his new MG rx for ~2 weeks with no improvement and now his R eye has bene effected. He sees a neurologist in 2 weeks. PT provided patient and dtr with printed education including when to return to ER. He denies difficulty breathing/swallowing at time of eval, but states that those functions do become difficult at times throughout the day. Dtr reports that he's "doing nothing" at home. Patient not appropriate for OP PT at this time until he is better medically managed.   CLINICAL DECISION MAKING: Unstable/unpredictable  EVALUATION COMPLEXITY: High  PLAN:  PT FREQUENCY: one time visit  PT DURATION: other: 1x visit   Westley Foots, PT Westley Foots, PT, DPT, CBIS  09/27/2022, 9:25 AM

## 2022-10-10 ENCOUNTER — Other Ambulatory Visit: Payer: Self-pay | Admitting: Pharmacy Technician

## 2022-10-10 ENCOUNTER — Encounter: Payer: Self-pay | Admitting: Neurology

## 2022-10-10 ENCOUNTER — Ambulatory Visit: Payer: PPO | Admitting: Neurology

## 2022-10-10 VITALS — BP 110/65 | HR 125 | Ht 73.0 in | Wt 187.0 lb

## 2022-10-10 DIAGNOSIS — G7001 Myasthenia gravis with (acute) exacerbation: Secondary | ICD-10-CM | POA: Diagnosis not present

## 2022-10-10 MED ORDER — PYRIDOSTIGMINE BROMIDE 60 MG PO TABS: 60.0000 mg | ORAL_TABLET | Freq: Three times a day (TID) | ORAL | 3 refills | Status: DC

## 2022-10-10 MED ORDER — PREDNISONE 10 MG PO TABS: 10.0000 mg | ORAL_TABLET | Freq: Every day | ORAL | 3 refills | Status: DC

## 2022-10-10 NOTE — Patient Instructions (Addendum)
Increase mestinon to 60mg  three times daily (6:30a, 11a, 4p)  Start prednisone 10mg  daily in the morning  We will contact the infusion center to get Vyvgart Hytrulo infusion approved  Start calcium supplements  Follow-up with your primary care doctor in 2 weeks to monitor your blood sugar  Please send me an update in 2 days to see how you are doing  I will see you back in 2 weeks

## 2022-10-10 NOTE — Progress Notes (Signed)
[facility] Clinic Note - Initial Visit   Date: 10/10/2022   ROBART BARTELSON MRN: [id number] DOB: 07/20/1931   Dear Dr Manus Gunning:  Thank you for your kind referral of Jovoni L Chmura for consultation of myasthenia gravis. Although his history is well known to you, please allow [place] to reiterate it for the purpose of our medical record. The patient was accompanied to the clinic by daugher who also provides collateral information.     Cloud L Bente is a 89 y.o. left-handed male with diet controlled diabetes mellitus, GERD, hypertension, history of prostate cancer, lung cancer s/p left upper lobe resection (2019) presenting for evaluation of newly diagnosed myasthenia gravis.   IMPRESSION: Seropositive generalized myasthenia gravis with exacerbation presenting with ptosis, diplopia, dysphagia, dyspnea, and generalized weakness (08/2022)  Thymoma negative. Since starting mestinon, dysphagia and dyspnea has improved but he continues to have severe bilateral ptosis, to the point were his eyelids stay closed most of the time.  This is having significant impact on independent functioning.    I had an extensive discussion with the patient regarding the diagnosis, pathogenesis, prognosis, management plan, and potential crisis myasthenia gravis.   He also has signs of peripheral neuropathy on exam, which is most likely idiopathic.   PLAN: Increase mestinon to 60mg  three times daily (7a, 11a, 4p) Start prednisone 10mg  daily.  Steroid side effects discussed.  He has diet-controlled diabetes and I have asked him to follow-up with PCP in 2 weeks to monitor blood sugars as the steroids may certainly raise his blood sugars Start PA for Vyvgart Hytrulo  Return to clinic in 2 weeks  ------------------------------------------------------------- History of present illness: Early August 2024, he began having double vision and left eye ptosis.  Daughter went to the ER due to  concern of stroke where CT head was negative.  He saw Dr. Dione Booze the following day who felt that symptoms were due to myasthenia gravis so urged him to go to the ER.  He went to [facility] where MRI brain did not show stroke.  There was high clinical suspicion for myasthenia so he was started on mestinon 30mg  three times daily which did help his difficulty with swallowing and shortness of breath.  Unfortunately, there has been no improvement with droopiness of the eyelids.  Eyelids are slightly better for about 15 minutes in the morning, but then quickly start to feel heavy again. He does not notice double vision much because eyes stay closed.  He is very frustrated by not being able to see or keep his eyes open.   Out-side paper records, electronic medical record, and images have been reviewed where available and summarized as:  Labs 09/13/2022:  AChR antibody binding 3.73*  MRI brain wwo contrast 09/12/2022: 1. No acute intracranial pathology or other finding to explain the patient's symptoms. 2. 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 3. On initial injection of intravenous contrast, the patient's IV infiltrative and 7.5 cc con trast was injected. Per the technologist, there was mild swelling at the injection site but no significant pain or neurologic symptoms. Recommend monitoring of the IV site while the patient is in the ED/inpatient, with instructions to return if progressive pain or neurologic symptoms develop.  CT chest wo contrast 09/15/2022:  No evidence of thymoma. Status post left upper lobectomy. No evidence of recurrent or metastatic disease. Moderate hiatal hernia with mildly irregular left lateral wall thickening, poorly evaluated. Consider endoscopy for  further evaluation.   Aortic Atherosclerosis (ICD10-I70.0) and Emphysema (ICD10-J43.9).   Lab Results  Component Value Date   HGBA1C 7.1 (H) 09/13/2022    Lab Results  Component Value Date   VITAMINB12 406 09/13/2022     Past Medical History:  Diagnosis Date   Arthritis    Cancer (HCC)    HX PROSTATE CANCER - MANY YRS AGO - ? RADIATION TX. Lung cancer   Diabetes mellitus without complication (HCC)    DIET CONTROLLED   GERD (gastroesophageal reflux disease)    Hearing loss    History of hiatal hernia    History of kidney stones    History of nonunion of fracture    LEFT PROXIMAL FEMUR   Hypertension    Pneumonia    Seasonal allergies     Past Surgical History:  Procedure Laterality Date   CONVERSION TO TOTAL HIP Left 03/19/2014   Procedure: CONVERSION OF PREVIOUS HIP SURGERY TO LEFT TOTAL HIP ARTHROPLASTY;  Surgeon: Loanne Drilling, MD;  Location: [facility];  Service: Orthopedics;  Laterality: Left;   CYSTOSCOPY WITH RETROGRADE PYELOGRAM, URETEROSCOPY AND STENT PLACEMENT Right 10/24/2020   Procedure: CYSTOSCOPY WITH RETROGRADE PYELOGRAM, URETEROSCOPY AND STENT PLACEMENT;  Surgeon: Crist Fat, MD;  Location: [facility];  Service: Urology;  Laterality: Right;   FEMUR IM NAIL  01/18/2012   Procedure: INTRAMEDULLARY (IM) NAIL FEMORAL;  Surgeon: Loanne Drilling, MD;  Location: [facility];  Service: Orthopedics;  Laterality: Left;   HARDWARE REMOVAL Left 01/13/2013   Procedure: HARDWARE REVISION LEFT HIP AND EXCHANGE OF COMPRESSION SCREW;  Surgeon: Loanne Drilling, MD;  Location: [facility];  Service: Orthopedics;  Laterality: Left;   HELLER MYOTOMY     HIATAL HERNIA REPAIR  2011   JOINT REPLACEMENT  12/2011   BIL KNEE REPLACEMENTS   LOBECTOMY Left    left upper lobe   THORACOSCOPY     VIDEO BRONCHOSCOPY WITH ENDOBRONCHIAL NAVIGATION N/A 03/21/2017   Procedure: VIDEO BRONCHOSCOPY WITH ENDOBRONCHIAL NAVIGATION;  Surgeon: Loreli Slot, MD;  Location: [facility];  Service: Thoracic;  Laterality: N/A;     Medications:  Outpatient Encounter Medications as of 10/10/2022  Medication Sig   acetaminophen (TYLENOL) 500 MG tablet Take  500-1,000 mg by mouth every 8 (eight) hours as needed for moderate pain or headache.   amoxicillin (AMOXIL) 500 MG capsule Take 2,000 mg by mouth See admin instructions. Take 4 capsules (2000 mg) by mouth 1 hours prior to procedure.   Ascorbic Acid (VITAMIN C PO) Take 1 packet by mouth daily.   aspirin EC 81 MG tablet Take 81 mg by mouth as directed. MWF   hydrALAZINE (APRESOLINE) 25 MG tablet Take 2 tablets (50 mg total) by mouth 3 (three) times daily.   ipratropium (ATROVENT) 0.06 % nasal spray Place 1 spray into both nostrils 3 (three) times daily as needed (allergies).   lisinopril (ZESTRIL) 40 MG tablet Take 1 tablet (40 mg total) by mouth daily.   loratadine (CLARITIN) 10 MG tablet Take 10 mg by mouth daily.   meclizine (ANTIVERT) 25 MG tablet Take 25 mg by mouth 3 (three) times daily as needed for dizziness.   Misc Natural Products (ELDERBERRY IMMUNE COMPLEX) CHEW Chew 1 tablet by mouth daily.   Multiple Vitamins-Minerals (MULTIVITAMIN ADULTS) TABS Take 1 tablet by mouth daily.   ondansetron (ZOFRAN) 4 MG tablet Take 4 mg by mouth every 8 (eight) hours as needed for nausea or vomiting.   pantoprazole (PROTONIX) 40 MG tablet Take 1  tablet (40 mg total) by mouth 2 (two) times daily.   pyridostigmine (MESTINON) 60 MG tablet Take 1/2 tablet (30 mg total) by mouth 3 (three) times daily.   [DISCONTINUED] diphenhydrAMINE (BENADRYL) 25 MG tablet Take 25 mg by mouth daily. (Patient not taking: Reported on 09/27/2022)   [DISCONTINUED] hydrALAZINE (APRESOLINE) 25 MG tablet Take 2 tablets (50 mg total) by mouth 3 (three) times daily. (Patient not taking: Reported on 10/10/2022)   [DISCONTINUED] lisinopril (ZESTRIL) 40 MG tablet Take 1 tablet (40 mg total) by mouth daily. (Patient not taking: Reported on 10/10/2022)   [DISCONTINUED] metoprolol succinate (TOPROL XL) 25 MG 24 hr tablet Take 2 tablets (50 mg total) by mouth daily for 3 days, THEN 1 tablet (25 mg total) daily for 3 days.   [DISCONTINUED]  metoprolol succinate (TOPROL XL) 25 MG 24 hr tablet Take 2 tablets (50 mg total) by mouth daily for 3 days, THEN 1 tablet (25 mg total) daily for 3 days.   [DISCONTINUED] pyridostigmine (MESTINON) 60 MG tablet Take 1/2 tablet (30 mg total) by mouth 3 (three) times daily. (Patient not taking: Reported on 10/10/2022)   [DISCONTINUED] Vibegron (GEMTESA) 75 MG TABS Take 75 mg by mouth daily. (Patient not taking: Reported on 09/27/2022)   No facility-administered encounter medications on file as of 10/10/2022.    Allergies: No Known Allergies  Family History: Family History  Problem Relation Age of Onset   Heart attack Mother    Hypertension Mother    Heart attack Father    Hypertension Father    Colon cancer Neg Hx    Esophageal cancer Neg Hx    Rectal cancer Neg Hx    Stomach cancer Neg Hx     Social History: Social History   Tobacco Use   Smoking status: Never   Smokeless tobacco: Never  Vaping Use   Vaping status: Never Used  Substance Use Topics   Alcohol use: No   Drug use: No   Social History   Social History Narrative   Are you right handed or left handed? Left Handed    Are you currently employed ?    What is your current occupation?   Do you live at home alone? yes   Who lives with you? Grand daughter lives next door and daughter stays with patient on Monday-Friday all day long.    What type of home do you live in: 1 story or 2 story? Lives in          Lung Cancer in 2019        Vital Signs:  BP 110/65   Pulse (!) 125   Ht 6\' 1"  (1.854 m)   Wt 187 lb (84.8 kg)   SpO2 98%   BMI 24.67 kg/m   Neurological Exam: MENTAL STATUS including orientation to time, place, person, recent and remote memory, attention span and concentration, language, and fund of knowledge is normal.  Speech is not dysarthric.  CRANIAL NERVES: II:  No visual field defects.     III-IV-VI: Pupils equal round and reactive to light.  Normal conjugate, extra-ocular eye movements in all  directions of gaze.  No nystagmus.  Severe bilateral ptosis, with essentially closed eyes - there is trace opening if he extends the neck, but this is more from lower leg retraction.   V:  Normal facial sensation.    VII:  Normal facial symmetry and movements.  Orbicularis oculi and buccinator 3/5.  Orbicularis oris 5/5. VIII:  Normal hearing and vestibular  function.   IX-X:  Normal palatal movement.   XI:  Normal shoulder shrug and head rotation.   XII:  Normal tongue strength and range of motion, no deviation or fasciculation.  MOTOR:  No atrophy, fasciculations or abnormal movements.  No pronator drift.   Upper Extremity:  Right  Left  Deltoid  5/5   5/5   Biceps  5/5   5/5   Triceps  5/5   5/5   Wrist extensors  5/5   5/5   Wrist flexors  5/5   5/5   Finger extensors  5/5   5/5   Finger flexors  5/5   5/5   Dorsal interossei  5/5   5/5   Abductor pollicis  5/5   5/5   Tone (Ashworth scale)  0  0   Lower Extremity:  Right  Left  Hip flexors  5/5   5/5   Hip extensors  5/5   5/5   Knee flexors  5/5   5/5   Knee extensors  5/5   5/5   Dorsiflexors  5/5   5/5   Plantarflexors  5/5   5/5   Toe extensors  5/5   5/5   Toe flexors  5/5   5/5   Tone (Ashworth scale)  0  0   MSRs:                                           Right        Left brachioradialis 1+  1+  biceps 1+  1+  triceps 1+  1+  patellar 0  0  ankle jerk 0  0  plantar response down  down   SENSORY:  Vibration reduced in the legs bilaterally.   COORDINATION/GAIT: Normal finger-to- nose-finger.  Intact rapid alternating movements bilaterally.  Able to rise from a chair without using arms.  Gait is mildly wide-based, stooped, unassisted.   Total time spent reviewing records, interview, history/exam, documentation, and coordination of care on day of encounter:  70 min    Thank you for allowing me to participate in patient's care.  If I can answer any additional questions, I would be pleased to do so.     Sincerely,    Natanel Snavely K. Allena Katz, DO

## 2022-10-11 ENCOUNTER — Telehealth: Payer: Self-pay | Admitting: Pharmacy Technician

## 2022-10-11 NOTE — Telephone Encounter (Signed)
Dr. Allena Katz, [place] [facility] note: Patient has been approved. We will schedule patient as soon as possible.  Auth Submission: APPROVED Site of care: Site of care: [facility] Payer: [employer] ADVT Medication & CPT/J Code(s) submitted: Vyvgart Hytrulo [id number]  Route of submission (phone, fax, portal): FAX Phone #[phone] [phone] Fax # [phone] Auth type: Buy/Bill PB Units/visits requested: 4 doses Reference number: [id number] Approval from: 10/10/22 to 01/08/23   @[place] , please enter tx plan.

## 2022-10-12 ENCOUNTER — Other Ambulatory Visit: Payer: Self-pay | Admitting: Pharmacy Technician

## 2022-10-12 ENCOUNTER — Encounter: Payer: Self-pay | Admitting: Neurology

## 2022-10-12 DIAGNOSIS — G7000 Myasthenia gravis without (acute) exacerbation: Secondary | ICD-10-CM | POA: Insufficient documentation

## 2022-10-17 ENCOUNTER — Ambulatory Visit (INDEPENDENT_AMBULATORY_CARE_PROVIDER_SITE_OTHER): Payer: PPO

## 2022-10-17 VITALS — BP 123/76 | HR 86 | Temp 97.5°F | Resp 18 | Ht 73.0 in | Wt 183.8 lb

## 2022-10-17 DIAGNOSIS — G7000 Myasthenia gravis without (acute) exacerbation: Secondary | ICD-10-CM

## 2022-10-17 MED ORDER — EFGARTIGIMOD ALFA-HYALUR-QVFC 180-2000 MG-UNIT/ML ~~LOC~~ SOLN
1008.0000 mg | Freq: Once | SUBCUTANEOUS | Status: AC
  Administered 2022-10-17: 1008 mg via SUBCUTANEOUS
  Filled 2022-10-17: qty 5.6

## 2022-10-17 NOTE — Patient Instructions (Signed)
Efgartigimod Alfa; Hyaluronidase Injection What is this medication? EFGARTIGIMOD ALFA; HYALURONIDASE (ef gar TIG i mod AL fa; hye al ur ON i dase) treats myasthenia gravis, a condition that causes muscles to easily weaken or fatigue. It works by decreasing muscle weakness and loss of movement. This medicine may be used for other purposes; ask your health care provider or pharmacist if you have questions. COMMON BRAND NAME(S): VYVGART Hytrulo What should I tell my care team before I take this medication? They need to know if you have any of these conditions: Immune system problems Infection An unusual or allergic reaction to efgartigimod alfa, hyaluronidase, other medications, foods, dyes, or preservatives Pregnant or trying to get pregnant Breastfeeding How should I use this medication? This medication is injected under the skin. It is given by your care team in a hospital or clinic setting. Talk to your care team about the use of this medication in children. Special care may be needed. Overdosage: If you think you have taken too much of this medicine contact a poison control center or emergency room at once. NOTE: This medicine is only for you. Do not share this medicine with others. What if I miss a dose? Keep appointments for follow-up doses. It is important not to miss your dose. Call your care team if you are unable to keep an appointment. What may interact with this medication? Eculizumab Immune globulin Live virus vaccines Ravulizumab Rozanolixizumab This list may not describe all possible interactions. Give your health care provider a list of all the medicines, herbs, non-prescription drugs, or dietary supplements you use. Also tell them if you smoke, drink alcohol, or use illegal drugs. Some items may interact with your medicine. What should I watch for while using this medication? Your condition will be monitored carefully while you are receiving this medication. This medication  may increase your risk of getting an infection. Call your care team for advice if you get a fever, chills, sore throat, or other symptoms of a cold or flu. Do not treat yourself. Try to avoid being around people who are sick. This medication can decrease the response to a vaccine. If you need to get vaccinated, tell your care team if you have received this medication within the last month. Extra booster doses may be needed. Talk to your care team to see if a different vaccination schedule is needed. What side effects may I notice from receiving this medication? Side effects that you should report to your care team as soon as possible: Allergic reactions or angioedema--skin rash, itching or hives, swelling of the face, eyes, lips, tongue, arms, or legs, trouble swallowing or breathing Infection--fever, chills, cough, sore throat, wounds that don't heal, pain or trouble when passing urine, general feeling of discomfort or being unwell Infusion reactions--chest pain, shortness of breath or trouble breathing, feeling faint or lightheaded Side effects that usually do not require medical attention (report these to your care team if they continue or are bothersome): Burning or tingling sensation in hands or feet Headache Muscle pain This list may not describe all possible side effects. Call your doctor for medical advice about side effects. You may report side effects to FDA at 1-800-FDA-1088. Where should I keep my medication? This medication is given in a hospital or clinic. It will not be stored at home. NOTE: This sheet is a summary. It may not cover all possible information. If you have questions about this medicine, talk to your doctor, pharmacist, or health care provider.  2024  Elsevier/Gold Standard (2022-01-19 00:00:00)

## 2022-10-17 NOTE — Progress Notes (Signed)
Diagnosis: Myasthenia Gravis  Provider:  Chilton Greathouse MD  Procedure: Subcutaneous Infusion  Vyvgart Hytrulo, Dose: 1,008mg  , Site: subcutaneous, Number of injections: 1  Post Care: Observation period completed  Discharge: Condition: Good, Destination: Home . AVS Provided  Performed by:  Adriana Mccallum, RN

## 2022-10-24 ENCOUNTER — Ambulatory Visit: Payer: PPO

## 2022-10-24 VITALS — BP 103/61 | HR 111 | Temp 97.5°F | Resp 18 | Ht 73.0 in | Wt 182.8 lb

## 2022-10-24 DIAGNOSIS — G7000 Myasthenia gravis without (acute) exacerbation: Secondary | ICD-10-CM

## 2022-10-24 MED ORDER — EFGARTIGIMOD ALFA-HYALUR-QVFC 180-2000 MG-UNIT/ML ~~LOC~~ SOLN
1008.0000 mg | Freq: Once | SUBCUTANEOUS | Status: AC
  Administered 2022-10-24: 1008 mg via SUBCUTANEOUS
  Filled 2022-10-24: qty 5.6

## 2022-10-24 NOTE — Progress Notes (Signed)
Diagnosis: Myasthenia Gravis  Provider:  Chilton Greathouse MD  Procedure: Injection  Vyvgart Hytrulo, Dose: 1,008mg  , Site: subcutaneous, Number of injections: 1  Post Care: Observation period completed  Discharge: Condition: Good, Destination: Home . AVS Declined  Performed by:  Adriana Mccallum, RN

## 2022-10-25 ENCOUNTER — Encounter: Payer: Self-pay | Admitting: Neurology

## 2022-10-25 ENCOUNTER — Ambulatory Visit: Payer: PPO | Admitting: Neurology

## 2022-10-25 VITALS — BP 107/66 | HR 91 | Ht 73.0 in | Wt 184.0 lb

## 2022-10-25 DIAGNOSIS — G7001 Myasthenia gravis with (acute) exacerbation: Secondary | ICD-10-CM | POA: Diagnosis not present

## 2022-10-25 NOTE — Patient Instructions (Signed)
Continue prednisone 10mg  daily Continue Vyvgart Hytrulo Continue mestinon 60mg  three times daily (7a, 11a, and 4pm)

## 2022-10-25 NOTE — Progress Notes (Signed)
Follow-up Visit   Date: 10/25/2022    Joseph [name] MRN: [id number] DOB: 1931/09/27    Joseph [name] is a 89 y.o. left-handed Caucasian male with diet controlled diabetes mellitus, GERD, hypertension, history of prostate cancer, lung cancer s/p left upper lobe resection (2019) returning to the clinic for follow-up of seropositive myasthenia gravis.  The patient was accompanied to the clinic by daughter who also provides collateral information.    IMPRESSION/PLAN: Seropositive generalized myasthenia gravis with mild exacerbation, thymoma negative (08/2022).  Exam shows mild weakness in the bulbar muscles and hip flexors.  Overall, there is a marked improvement in ptosis, which was severe at his last visit.   - Continue prednisone 10mg  daily  - Continue Vyvgart Hytrulo  - Continue mestinon 60mg  three times daily (7a, 11a, and 4pm)  - Recommend follow-up with PCP to manage blood sugar   Return to clinic in 6 weeks  --------------------------------------------- History of present illness: Early August 2024, he began having double vision and left eye ptosis.  Daughter went to the ER due to concern of stroke where CT head was negative.  He saw Dr. Dione Booze the following day who felt that symptoms were due to myasthenia gravis so urged him to go to the ER.  He went to [facility] where MRI brain did not show stroke.  There was high clinical suspicion for myasthenia so he was started on mestinon 30mg  three times daily which did help his difficulty with swallowing and shortness of breath.  Unfortunately, there has been no improvement with droopiness of the eyelids.  Eyelids are slightly better for about 15 minutes in the morning, but then quickly start to feel heavy again. He does not notice double vision much because eyes stay closed.  He is very frustrated by not being able to see or keep his eyes open.    UPDATE 10/25/2022:  He is here for follow-up visit.  He has been on prednisone 10mg   daily x 2 weeks, and received his second infusion of Vyvgart Hytrulo yesterday.  Over the past 3-4 days, he noticed that his right eyelid was opening up and staying open for longer periods of time.  When tired and in the evening, it continues to get droopy.  His speech and swallow is normal.  He has some weakness in the legs and uses a walker. Overall, he is feeling much better.  No new complaints.  He has been monitor blood sugar at home which is staying 140-180s.    Medications:  Current Outpatient Medications on File Prior to Visit  Medication Sig Dispense Refill   acetaminophen (TYLENOL) 500 MG tablet Take 500-1,000 mg by mouth every 8 (eight) hours as needed for moderate pain or headache.     amoxicillin (AMOXIL) 500 MG capsule Take 2,000 mg by mouth See admin instructions. Take 4 capsules (2000 mg) by mouth 1 hours prior to procedure.     Ascorbic Acid (VITAMIN C PO) Take 1 packet by mouth daily.     aspirin EC 81 MG tablet Take 81 mg by mouth as directed. MWF     hydrALAZINE (APRESOLINE) 25 MG tablet Take 2 tablets (50 mg total) by mouth 3 (three) times daily. 180 tablet 1   ipratropium (ATROVENT) 0.06 % nasal spray Place 1 spray into both nostrils 3 (three) times daily as needed (allergies).     lisinopril (ZESTRIL) 40 MG tablet Take 1 tablet (40 mg total) by mouth daily. 90 tablet 1   loratadine (  CLARITIN) 10 MG tablet Take 10 mg by mouth daily.     meclizine (ANTIVERT) 25 MG tablet Take 25 mg by mouth 3 (three) times daily as needed for dizziness.     Misc Natural Products (ELDERBERRY IMMUNE COMPLEX) CHEW Chew 1 tablet by mouth daily.     Multiple Vitamins-Minerals (MULTIVITAMIN ADULTS) TABS Take 1 tablet by mouth daily.     ondansetron (ZOFRAN) 4 MG tablet Take 4 mg by mouth every 8 (eight) hours as needed for nausea or vomiting.     pantoprazole (PROTONIX) 40 MG tablet Take 1 tablet (40 mg total) by mouth 2 (two) times daily. 60 tablet 2   predniSONE (DELTASONE) 10 MG tablet Take 1  tablet (10 mg total) by mouth daily with breakfast. 30 tablet 3   pyridostigmine (MESTINON) 60 MG tablet Take 1 tablet (60 mg total) by mouth 3 (three) times daily. 90 tablet 3   No current facility-administered medications on file prior to visit.    Allergies: No Known Allergies  Vital Signs:  BP 107/66   Pulse 91   Ht 6\' 1"  (1.854 m)   Wt 184 lb (83.5 kg)   SpO2 97%   BMI 24.28 kg/m   Neurological Exam: MENTAL STATUS including orientation to time, place, person, recent and remote memory, attention span and concentration, language, and fund of knowledge is normal.  Speech is not dysarthric.  CRANIAL NERVES:  No visual field defects.  Pupils equal round and reactive to light.  Normal conjugate, extra-ocular eye movements in all directions of gaze.  Minimal right ptosis at rest with no worsening with sustained upgaze. Face is symmetric. Orbicularis oculi 5/5, orbicularis oris 4/5, buccinator 4/5.  Palate elevates symmetrically.  Tongue is midline and strength is 5/5  MOTOR:  Motor strength is 5/5 in all extremities, except bilateral hip flexors 5-/5.  No atrophy, fasciculations or abnormal movements.  No pronator drift.  Tone is normal.    COORDINATION/GAIT:  He is able to stand up without pushing off with arms.  Gait is mildly wide-based, unassisted.  He has walker which he uses for longer distances.   Data: Labs 09/13/2022:  AChR antibody binding 3.73*   MRI brain wwo contrast 09/12/2022: 1. No acute intracranial pathology or other finding to explain the patient's symptoms. 2. 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 3. On initial injection of intravenous contrast, the patient's IV infiltrative and 7.5 cc con trast was injected. Per the technologist, there was mild swelling at the injection site but no significant pain or neurologic symptoms. Recommend monitoring of the IV site while the patient is in the  ED/inpatient, with instructions to return if progressive pain or neurologic symptoms develop.   CT chest wo contrast 09/15/2022:  No evidence of thymoma. Status post left upper lobectomy. No evidence of recurrent or metastatic disease. Moderate hiatal hernia with mildly irregular left lateral wall thickening, poorly evaluated. Consider endoscopy for further evaluation.   Aortic Atherosclerosis (ICD10-I70.0) and Emphysema (ICD10-J43.9).   Total time spent reviewing records, interview, history/exam, documentation, and coordination of care on day of encounter:  30 min    Thank you for allowing me to participate in patient's care.  If I can answer any additional questions, I would be pleased to do so.    Sincerely,    Joseph Mcraney K. Allena Katz, DO

## 2022-10-31 ENCOUNTER — Ambulatory Visit: Payer: PPO

## 2022-10-31 VITALS — BP 131/75 | HR 101 | Temp 98.0°F | Resp 16 | Ht 73.0 in | Wt 183.2 lb

## 2022-10-31 DIAGNOSIS — G7000 Myasthenia gravis without (acute) exacerbation: Secondary | ICD-10-CM | POA: Diagnosis not present

## 2022-10-31 MED ORDER — EFGARTIGIMOD ALFA-HYALUR-QVFC 180-2000 MG-UNIT/ML ~~LOC~~ SOLN
1008.0000 mg | Freq: Once | SUBCUTANEOUS | Status: AC
  Administered 2022-10-31: 1008 mg via SUBCUTANEOUS
  Filled 2022-10-31: qty 5.6

## 2022-10-31 NOTE — Progress Notes (Signed)
Diagnosis: Myasthenia Gravis  Provider:  Chilton Greathouse MD  Procedure: Injection  Vyvgart Hytrulo, Dose: 1,008mg  , Site: subcutaneous, Number of injections: 1  Post Care: Observation period completed  Discharge: Condition: Good, Destination: Home . AVS Provided  Performed by:  Adriana Mccallum, RN

## 2022-11-07 ENCOUNTER — Ambulatory Visit: Payer: PPO

## 2022-11-07 VITALS — BP 106/76 | HR 75 | Temp 97.6°F | Resp 18 | Ht 73.0 in | Wt 181.6 lb

## 2022-11-07 DIAGNOSIS — G7000 Myasthenia gravis without (acute) exacerbation: Secondary | ICD-10-CM

## 2022-11-07 MED ORDER — EFGARTIGIMOD ALFA-HYALUR-QVFC 180-2000 MG-UNIT/ML ~~LOC~~ SOLN
1008.0000 mg | Freq: Once | SUBCUTANEOUS | Status: AC
  Administered 2022-11-07: 1008 mg via SUBCUTANEOUS
  Filled 2022-11-07: qty 5.6

## 2022-11-07 NOTE — Progress Notes (Signed)
Diagnosis: Myasthenia Gravis  Provider:  Chilton Greathouse MD  Procedure: Injection  Vyvgart Hytrulo, Dose: 1,008mg  , Site: subcutaneous, Number of injections: 1  Post Care: Observation period completed  Discharge: Condition: Good, Destination: Home . AVS Provided  Performed by:  Adriana Mccallum, RN

## 2022-11-13 ENCOUNTER — Encounter: Payer: Self-pay | Admitting: Neurology

## 2022-11-14 ENCOUNTER — Ambulatory Visit: Payer: PPO | Admitting: Internal Medicine

## 2022-11-14 ENCOUNTER — Encounter: Payer: Self-pay | Admitting: Internal Medicine

## 2022-11-14 VITALS — BP 110/60 | HR 93 | Ht 73.0 in | Wt 184.0 lb

## 2022-11-14 DIAGNOSIS — R1319 Other dysphagia: Secondary | ICD-10-CM

## 2022-11-14 DIAGNOSIS — K219 Gastro-esophageal reflux disease without esophagitis: Secondary | ICD-10-CM

## 2022-11-14 MED ORDER — PANTOPRAZOLE SODIUM 40 MG PO TBEC: 40.0000 mg | DELAYED_RELEASE_TABLET | Freq: Two times a day (BID) | ORAL | 4 refills | Status: DC

## 2022-11-14 NOTE — Progress Notes (Signed)
HISTORY OF PRESENT ILLNESS:  Joseph [name] is a 89 y.o. male with multiple medical problems as listed below.  He is sent today for follow-up after being hospitalized with symptomatic myasthenia gravis September 12, 2022 through September 15, 2022.  As part of his symptom complex he was experiencing neurogenic dysphagia with choking spells.  He does have a history of achalasia and is status post Heller myotomy.  Last underwent esophageal dilation post myotomy 2011.  During his hospitalization they did order a barium esophagram.  He was noted to have dysmotility.  As well narrowing in the region of prior fundoplication with transient delay of tablet.  After being treated for his myasthenia gravis his dysphagia resolved.  He is eating normally.  No further choking spells.  He is accompanied today by his daughter [name].  They did increase his pantoprazole from once daily to twice daily while in the hospital.  She is wondering about a refill of his prescription.  Other x-rays laboratories reviewed.  Last colonoscopy 2014.  REVIEW OF SYSTEMS:  All non-GI ROS negative unless otherwise stated in the HPI except for arthritis  Past Medical History:  Diagnosis Date   Arthritis    Cancer (HCC)    HX PROSTATE CANCER - MANY YRS AGO - ? RADIATION TX. Lung cancer   Diabetes mellitus without complication (HCC)    DIET CONTROLLED   GERD (gastroesophageal reflux disease)    Graves disease    Hearing loss    History of hiatal hernia    History of kidney stones    History of nonunion of fracture    LEFT PROXIMAL FEMUR   Hypertension    Pneumonia    Seasonal allergies     Past Surgical History:  Procedure Laterality Date   CONVERSION TO TOTAL HIP Left 03/19/2014   Procedure: CONVERSION OF PREVIOUS HIP SURGERY TO LEFT TOTAL HIP ARTHROPLASTY;  Surgeon: Loanne Drilling, MD;  Location: [facility];  Service: Orthopedics;  Laterality: Left;   CYSTOSCOPY WITH RETROGRADE PYELOGRAM, URETEROSCOPY AND STENT PLACEMENT Right  10/24/2020   Procedure: CYSTOSCOPY WITH RETROGRADE PYELOGRAM, URETEROSCOPY AND STENT PLACEMENT;  Surgeon: Crist Fat, MD;  Location: [facility];  Service: Urology;  Laterality: Right;   FEMUR IM NAIL  01/18/2012   Procedure: INTRAMEDULLARY (IM) NAIL FEMORAL;  Surgeon: Loanne Drilling, MD;  Location: [facility];  Service: Orthopedics;  Laterality: Left;   HARDWARE REMOVAL Left 01/13/2013   Procedure: HARDWARE REVISION LEFT HIP AND EXCHANGE OF COMPRESSION SCREW;  Surgeon: Loanne Drilling, MD;  Location: [facility];  Service: Orthopedics;  Laterality: Left;   HELLER MYOTOMY     HIATAL HERNIA REPAIR  2011   JOINT REPLACEMENT  12/2011   BIL KNEE REPLACEMENTS   LOBECTOMY Left    left upper lobe   THORACOSCOPY     VIDEO BRONCHOSCOPY WITH ENDOBRONCHIAL NAVIGATION N/A 03/21/2017   Procedure: VIDEO BRONCHOSCOPY WITH ENDOBRONCHIAL NAVIGATION;  Surgeon: Loreli Slot, MD;  Location: [facility];  Service: Thoracic;  Laterality: N/A;    Social History [name]  reports that he has never smoked. He has never used smokeless tobacco. He reports that he does not drink alcohol and does not use drugs.  family history includes Heart attack in his father and mother; Hypertension in his father and mother.  No Known Allergies     PHYSICAL EXAMINATION: Vital signs: BP 110/60   Pulse 93   Ht 6\' 1"  (1.854 m)   Wt 184 lb (83.5 kg)  BMI 24.28 kg/m   Constitutional: generally well-appearing, no acute distress Psychiatric: alert and oriented x3, cooperative Eyes: extraocular movements intact, anicteric, conjunctiva pink Mouth: oral pharynx moist, no lesions Neck: supple no lymphadenopathy Cardiovascular: heart regular rate and rhythm, no murmur Lungs: clear to auscultation bilaterally Abdomen: soft, nontender, nondistended, no obvious ascites, no peritoneal signs, normal bowel sounds, no organomegaly Rectal: Omitted Extremities: no clubbing, cyanosis, or lower extremity edema bilaterally Skin: no  lesions on visible extremities Neuro: No focal deficits.  Cranial nerve is intact  ASSESSMENT:  1.  Neurogenic dysphagia secondary to myasthenia gravis.  Resolved after treatment of myasthenia gravis. 2.  History of achalasia status post Heller myotomy with fundoplication. 3.  Recent esophagram demonstrates dysmotility and narrowing in the region of prior fundoplication as expected.  Currently eating normally 4.  GERD.  On twice daily PPI   PLAN:  1.  Reviewed his workup and discussed the nature of his transient dysphagia 2.  Continue PPI.  Prescription refilled 3.  Ongoing general care with PCP and other specialist.  No indication for endoscopy. 4.  GI follow-up as needed A total time of 45 minutes was spent preparing to see the patient, reviewing emerita records, obtaining comprehensive history, performing medically appropriate physical examination, ordering medication, counseling and educating the patient and his daughter regarding the above listed issues.  Finally, documenting clinical information in the health record

## 2022-11-14 NOTE — Patient Instructions (Signed)
We have sent the following medications to your pharmacy for you to pick up at your convenience: Pantoprazole   Follow-up as needed.   _______________________________________________________  If your blood pressure at your visit was 140/90 or greater, please contact your primary care physician to follow up on this.  _______________________________________________________  If you are age 87 or older, your body mass index should be between 23-30. Your Body mass index is 24.28 kg/m. If this is out of the aforementioned range listed, please consider follow up with your Primary Care Provider.  If you are age 44 or younger, your body mass index should be between 19-25. Your Body mass index is 24.28 kg/m. If this is out of the aformentioned range listed, please consider follow up with your Primary Care Provider.   ________________________________________________________  The [facility] providers would like to encourage you to use [facility] to communicate with providers for non-urgent requests or questions.  Due to long hold times on the telephone, sending your provider a message by [facility] may be a faster and more efficient way to get a response.  Please allow 48 business hours for a response.  Please remember that this is for non-urgent requests.  _______________________________________________________  Thank you for choosing me and [facility].  Dr. Yancey Flemings

## 2022-11-15 ENCOUNTER — Encounter: Payer: Self-pay | Admitting: Neurology

## 2022-11-29 ENCOUNTER — Encounter: Payer: Self-pay | Admitting: Neurology

## 2022-11-29 ENCOUNTER — Ambulatory Visit: Payer: PPO | Admitting: Neurology

## 2022-11-29 VITALS — BP 107/74 | HR 67 | Ht 73.0 in | Wt 182.0 lb

## 2022-11-29 DIAGNOSIS — G7000 Myasthenia gravis without (acute) exacerbation: Secondary | ICD-10-CM | POA: Diagnosis not present

## 2022-11-29 MED ORDER — PREDNISONE 1 MG PO TABS: ORAL_TABLET | ORAL | 3 refills | Status: DC

## 2022-11-29 MED ORDER — PREDNISONE 5 MG PO TABS: 5.0000 mg | ORAL_TABLET | Freq: Every day | ORAL | 1 refills | Status: DC

## 2022-11-29 NOTE — Patient Instructions (Signed)
For the month of November, take prednisone 8mg  (5mg  + 3mg ) For the month of December, take prednisone 7mg  (5mg  + 2mg ) Please send me a My Chart update in early January so I can decide whether we can reduce prednisone down to 6mg  daily.

## 2022-11-29 NOTE — Progress Notes (Signed)
Follow-up Visit   Date: 11/29/2022    Joseph [name] MRN: [id number] DOB: 1932/01/08    Joseph [name] is a 89 y.o. left-handed Caucasian male with diet controlled diabetes mellitus, GERD, hypertension, history of prostate cancer, lung cancer s/p left upper lobe resection (2019) returning to the clinic for follow-up of seropositive myasthenia gravis.  The patient was accompanied to the clinic by daughter who also provides collateral information.    IMPRESSION/PLAN: Seropositive generalized myasthenia gravis without exacerbation, thymoma negative (08/2022).  Exam is improved with no bulbar, ocular, or limb weakness.  There is no evidence of disease manifestation.   - Reduse prednisone to 8mg  x 1 month, then 7mg  thereafter - Continue Vyvgart Hytrullo  - Continue mestinon 60mg  three times daily (7a, 11a, and 4pm)  - MyChart update in January to determine whether to reduce prednisone to 6mg    - If there is any breakthrough weakness, restart prednisone 10mg  daily  Return to clinic in 3 months  --------------------------------------------- History of present illness: Early August 2024, he began having double vision and left eye ptosis.  Daughter went to the ER due to concern of stroke where CT head was negative.  He saw Dr. Dione Booze the following day who felt that symptoms were due to myasthenia gravis so urged him to go to the ER.  He went to [facility] where MRI brain did not show stroke.  There was high clinical suspicion for myasthenia so he was started on mestinon 30mg  three times daily which did help his difficulty with swallowing and shortness of breath.  Unfortunately, there has been no improvement with droopiness of the eyelids.  Eyelids are slightly better for about 15 minutes in the morning, but then quickly start to feel heavy again. He does not notice double vision much because eyes stay closed.  He is very frustrated by not being able to see or keep his eyes open.    UPDATE  10/25/2022:  He is here for follow-up visit.  He has been on prednisone 10mg  daily x 2 weeks, and received his second infusion of Vyvgart Hytrulo yesterday.  Over the past 3-4 days, he noticed that his right eyelid was opening up and staying open for longer periods of time.  When tired and in the evening, it continues to get droopy.  His speech and swallow is normal.  He has some weakness in the legs and uses a walker. Overall, he is feeling much better.  No new complaints.  He has been monitor blood sugar at home which is staying 140-180s.    UPDATE 11/29/2022:  He is here for for follow-up visit.  He reports doing great and denies any ptosis, facial weakness, slurred, or difficulty with swallowing.  He is back to doing his usual activities and mood is much better.  He is scheduled for Vyvgart Hytrullo next month.   Medications:  Current Outpatient Medications on File Prior to Visit  Medication Sig Dispense Refill   acetaminophen (TYLENOL) 500 MG tablet Take 500-1,000 mg by mouth every 8 (eight) hours as needed for moderate pain or headache.     amoxicillin (AMOXIL) 500 MG capsule Take 2,000 mg by mouth See admin instructions. Take 4 capsules (2000 mg) by mouth 1 hours prior to procedure.     Ascorbic Acid (VITAMIN C PO) Take 1 packet by mouth daily.     aspirin EC 81 MG tablet Take 81 mg by mouth as directed. MWF     Calcium Carbonate (CALCIUM  600 PO) Take by mouth.     hydrALAZINE (APRESOLINE) 25 MG tablet Take 2 tablets (50 mg total) by mouth 3 (three) times daily. 180 tablet 1   ipratropium (ATROVENT) 0.06 % nasal spray Place 1 spray into both nostrils 3 (three) times daily as needed (allergies).     lisinopril (ZESTRIL) 40 MG tablet Take 1 tablet (40 mg total) by mouth daily. 90 tablet 1   loratadine (CLARITIN) 10 MG tablet Take 10 mg by mouth daily.     meclizine (ANTIVERT) 25 MG tablet Take 25 mg by mouth 3 (three) times daily as needed for dizziness.     Misc Natural Products (ELDERBERRY  IMMUNE COMPLEX) CHEW Chew 1 tablet by mouth daily.     Multiple Vitamins-Minerals (MULTIVITAMIN ADULTS) TABS Take 1 tablet by mouth daily.     ondansetron (ZOFRAN) 4 MG tablet Take 4 mg by mouth every 8 (eight) hours as needed for nausea or vomiting.     pantoprazole (PROTONIX) 40 MG tablet Take 1 tablet (40 mg total) by mouth 2 (two) times daily. 180 tablet 4   predniSONE (DELTASONE) 10 MG tablet Take 1 tablet (10 mg total) by mouth daily with breakfast. 30 tablet 3   pyridostigmine (MESTINON) 60 MG tablet Take 1 tablet (60 mg total) by mouth 3 (three) times daily. 90 tablet 3   VITAMIN D PO Take by mouth. 600 mg daily     No current facility-administered medications on file prior to visit.    Allergies: No Known Allergies  Vital Signs:  BP 107/74   Pulse 67   Ht 6\' 1"  (1.854 m)   Wt 182 lb (82.6 kg)   SpO2 94%   BMI 24.01 kg/m   Neurological Exam: MENTAL STATUS including orientation to time, place, person, recent and remote memory, attention span and concentration, language, and fund of knowledge is normal.  Speech is not dysarthric.  CRANIAL NERVES:  No visual field defects.  Pupils equal round and reactive to light.  Normal conjugate, extra-ocular eye movements in all directions of gaze.  Minimal right ptosis at rest with no worsening with sustained upgaze. Face is symmetric. Orbicularis oculi 5/5, orbicularis oris 5/5, buccinator 5/5.  Palate elevates symmetrically.  Tongue is midline and strength is 5/5  MOTOR:  Motor strength is 5/5 in all extremities, including bilateral hip flexors (improved)  No atrophy, fasciculations or abnormal movements.  No pronator drift.  Tone is normal.    COORDINATION/GAIT:  He is able to stand up without pushing off with arms.  Gait is mildly wide-based, unassisted.    Data: Labs 09/13/2022:  AChR antibody binding 3.73*   MRI brain wwo contrast 09/12/2022: 1. No acute intracranial pathology or other finding to explain the patient's symptoms. 2.  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 3. On initial injection of intravenous contrast, the patient's IV infiltrative and 7.5 cc con trast was injected. Per the technologist, there was mild swelling at the injection site but no significant pain or neurologic symptoms. Recommend monitoring of the IV site while the patient is in the ED/inpatient, with instructions to return if progressive pain or neurologic symptoms develop.   CT chest wo contrast 09/15/2022:  No evidence of thymoma. Status post left upper lobectomy. No evidence of recurrent or metastatic disease. Moderate hiatal hernia with mildly irregular left lateral wall thickening, poorly evaluated. Consider endoscopy for further evaluation.   Aortic Atherosclerosis (ICD10-I70.0) and Emphysema (ICD10-J43.9).  Thank you for allowing me to participate in patient's care.  If I can answer any additional questions, I would be pleased to do so.    Sincerely,    Anah Billard K. Allena Katz, DO

## 2022-12-19 ENCOUNTER — Ambulatory Visit: Payer: PPO

## 2022-12-19 VITALS — BP 132/66 | HR 97 | Temp 97.7°F | Resp 16 | Ht 73.0 in | Wt 183.4 lb

## 2022-12-19 DIAGNOSIS — G7000 Myasthenia gravis without (acute) exacerbation: Secondary | ICD-10-CM

## 2022-12-19 MED ORDER — EFGARTIGIMOD ALFA-HYALUR-QVFC 180-2000 MG-UNIT/ML ~~LOC~~ SOLN
1008.0000 mg | Freq: Once | SUBCUTANEOUS | Status: AC
  Administered 2022-12-19: 1008 mg via SUBCUTANEOUS

## 2022-12-19 NOTE — Progress Notes (Signed)
Diagnosis: Myasthenia Gravis  Provider:  Chilton Greathouse MD  Procedure: Injection  Efgartigimod alfa-hyalur-qvfc (Vyvgart hytrulo) , Dose: 1008 mg , Site: subcutaneous, Number of injections: 1  Administered in upper left abdomen.  Post Care: Observation period completed  Discharge: Condition: Stable, Destination: Home . AVS Provided  Performed by:  Wyvonne Lenz, RN

## 2022-12-26 ENCOUNTER — Ambulatory Visit: Payer: PPO

## 2022-12-26 VITALS — BP 93/64 | HR 70 | Temp 97.8°F | Resp 16 | Ht 73.0 in | Wt 181.4 lb

## 2022-12-26 DIAGNOSIS — G7000 Myasthenia gravis without (acute) exacerbation: Secondary | ICD-10-CM | POA: Diagnosis not present

## 2022-12-26 MED ORDER — EFGARTIGIMOD ALFA-HYALUR-QVFC 180-2000 MG-UNIT/ML ~~LOC~~ SOLN
1008.0000 mg | Freq: Once | SUBCUTANEOUS | Status: AC
Start: 2022-12-26 — End: 2022-12-26
  Administered 2022-12-26: 1008 mg via SUBCUTANEOUS
  Filled 2022-12-26: qty 5.6

## 2022-12-26 NOTE — Progress Notes (Signed)
Diagnosis: Myasthenia Gravis  Provider:  Chilton Greathouse MD  Procedure: Injection  Vyvgart, Dose: 1008 , Site: subcutaneous, Number of injections: 1  Post Care: Observation period completed  Discharge: Condition: Good, Destination: Home . AVS Provided  Performed by:  Rico Ala, LPN

## 2023-01-02 ENCOUNTER — Ambulatory Visit: Payer: PPO

## 2023-01-02 VITALS — BP 121/73 | HR 76 | Temp 98.1°F | Resp 16 | Ht 73.0 in | Wt 183.4 lb

## 2023-01-02 DIAGNOSIS — G7000 Myasthenia gravis without (acute) exacerbation: Secondary | ICD-10-CM

## 2023-01-02 MED ORDER — EFGARTIGIMOD ALFA-HYALUR-QVFC 180-2000 MG-UNIT/ML ~~LOC~~ SOLN
1008.0000 mg | Freq: Once | SUBCUTANEOUS | Status: AC
Start: 2023-01-02 — End: 2023-01-02
  Administered 2023-01-02: 1008 mg via SUBCUTANEOUS

## 2023-01-02 NOTE — Progress Notes (Signed)
Diagnosis: Myasthenia Gravis  Provider:  Chilton Greathouse MD  Procedure: Injection  Efgartigimod alfa-Hyalur-qvfc, Dose: 1008 mg , Site: subcutaneous, Number of injections: 1  Administered in left upper abdomen.  Post Care: Observation period completed  Discharge: Condition: Stable, Destination: Home . AVS Provided  Performed by:  Wyvonne Lenz, RN

## 2023-01-04 ENCOUNTER — Telehealth: Payer: Self-pay

## 2023-01-04 NOTE — Telephone Encounter (Signed)
Patients daughter, Joseph [name] left a Vyvgart Hytrulo assistance form with me that she filled out for her Dad, Joseph [name].  I sent this information to [facility] with [employer] to check on getting patient assistance for him because the vyvgart bills are very high.  Claris Che let me know that the patient is not eligible for free drug or a copay card because he has Medicare. She shared his information with Megan Comeaux to watch for foundations that may be able to assist with the cost.  I called Joseph [name] and shared this information with her, she verbalized understanding and appreciation.

## 2023-01-08 ENCOUNTER — Other Ambulatory Visit: Payer: Self-pay | Admitting: Neurology

## 2023-01-09 ENCOUNTER — Ambulatory Visit: Payer: PPO

## 2023-01-09 VITALS — BP 128/80 | HR 62 | Temp 97.9°F | Resp 16 | Ht 73.0 in | Wt 181.4 lb

## 2023-01-09 DIAGNOSIS — G7000 Myasthenia gravis without (acute) exacerbation: Secondary | ICD-10-CM | POA: Diagnosis not present

## 2023-01-09 MED ORDER — EFGARTIGIMOD ALFA-HYALUR-QVFC 180-2000 MG-UNIT/ML ~~LOC~~ SOLN
1008.0000 mg | Freq: Once | SUBCUTANEOUS | Status: AC
  Administered 2023-01-09: 1008 mg via SUBCUTANEOUS

## 2023-01-09 NOTE — Progress Notes (Signed)
Diagnosis: Myasthenia Gravis  Provider:  Chilton Greathouse MD  Procedure: Injection  Efgartigimod alfa-Hyalur-qvfc , Dose: 1008 mg , Site: subcutaneous, Number of injections: 1  Injection Site(s): Right upper quad. abdomen  Post Care: Observation period completed  Discharge: Condition: Stable, Destination: Home . AVS Provided  Performed by:  Wyvonne Lenz, RN

## 2023-02-05 ENCOUNTER — Encounter: Payer: Self-pay | Admitting: Neurology

## 2023-02-21 ENCOUNTER — Ambulatory Visit: Payer: PPO | Admitting: Neurology

## 2023-02-21 ENCOUNTER — Encounter: Payer: Self-pay | Admitting: Neurology

## 2023-02-21 VITALS — BP 124/70 | HR 105 | Ht 73.0 in | Wt 182.0 lb

## 2023-02-21 DIAGNOSIS — G7000 Myasthenia gravis without (acute) exacerbation: Secondary | ICD-10-CM | POA: Diagnosis not present

## 2023-02-21 NOTE — Progress Notes (Signed)
Follow-up Visit   Date: 02/21/2023    Joseph [name] MRN: [id number] DOB: July 23, 1931    Joseph [name] is a 89 y.o. left-handed Caucasian male with diet controlled diabetes mellitus, GERD, hypertension, history of prostate cancer, lung cancer s/p left upper lobe resection (2019) returning to the clinic for follow-up of seropositive myasthenia gravis.  The patient was accompanied to the clinic by daughter who also provides collateral information.    IMPRESSION/PLAN: Seropositive generalized myasthenia gravis without exacerbation, thymoma negative (08/2022).  Exam is improved with no bulbar, ocular, or limb weakness.  There is no evidence of disease manifestation. The highest dose of prednisone is 10mg .  Vyvgart Hytrulo started in September and received again in November.   - Continue prednisone 7mg  until the end of January.  Starting February, reduce prednisone to 6mg  x1 month, then 5mg  daily  - Continue mestinon 60mg  three times daily - Hold on Vyvgart Hytrullo unless there is breakthrough symptoms  Return to clinic in 2 months  --------------------------------------------- History of present illness: Early August 2024, he began having double vision and left eye ptosis.  Daughter went to the ER due to concern of stroke where CT head was negative.  He saw Dr. Dione Booze the following day who felt that symptoms were due to myasthenia gravis so urged him to go to the ER.  He went to [facility] where MRI brain did not show stroke.  There was high clinical suspicion for myasthenia so he was started on mestinon 30mg  three times daily which did help his difficulty with swallowing and shortness of breath.  Unfortunately, there has been no improvement with droopiness of the eyelids.  Eyelids are slightly better for about 15 minutes in the morning, but then quickly start to feel heavy again. He does not notice double vision much because eyes stay closed.  He is very frustrated by not being able to  see or keep his eyes open.    UPDATE 10/25/2022:  He is here for follow-up visit.  He has been on prednisone 10mg  daily x 2 weeks, and received his second infusion of Vyvgart Hytrulo yesterday.  Over the past 3-4 days, he noticed that his right eyelid was opening up and staying open for longer periods of time.  When tired and in the evening, it continues to get droopy.  His speech and swallow is normal.  He has some weakness in the legs and uses a walker. Overall, he is feeling much better.  No new complaints.  He has been monitor blood sugar at home which is staying 140-180s.    UPDATE 11/29/2022:  He is here for for follow-up visit.  He reports doing great and denies any ptosis, facial weakness, slurred, or difficulty with swallowing.  He is back to doing his usual activities and mood is much better.  He is scheduled for Vyvgart Hytrullo next month.   UPDATE 02/21/2023:  He is here for follow-up visit.  He has been doing well on prednisone 7mg  daily. He denies double vision, droopiness of the eyelids, speech/swallowing difficulty, or weakness.   He completed Vyvgart Hytrulo with last infusion on 12/10.  He was billed $3200 for his infusions last year and was told it would cost him $3400 with the first injection.  He was having "quivering' of the left leg and had steroid injection to the left hip after which his symptoms have signficantly improved.    Medications:  Current Outpatient Medications on File Prior to Visit  Medication  Sig Dispense Refill   acetaminophen (TYLENOL) 500 MG tablet Take 500-1,000 mg by mouth every 8 (eight) hours as needed for moderate pain or headache.     amoxicillin (AMOXIL) 500 MG capsule Take 2,000 mg by mouth See admin instructions. Take 4 capsules (2000 mg) by mouth 1 hours prior to procedure.     Ascorbic Acid (VITAMIN C PO) Take 1 packet by mouth daily.     aspirin EC 81 MG tablet Take 81 mg by mouth as directed. MWF     Calcium Carbonate (CALCIUM 600 PO) Take by mouth.      hydrALAZINE (APRESOLINE) 25 MG tablet Take 2 tablets (50 mg total) by mouth 3 (three) times daily. 180 tablet 1   ipratropium (ATROVENT) 0.06 % nasal spray Place 1 spray into both nostrils 3 (three) times daily as needed (allergies).     lisinopril (ZESTRIL) 40 MG tablet Take 1 tablet (40 mg total) by mouth daily. 90 tablet 1   loratadine (CLARITIN) 10 MG tablet Take 10 mg by mouth daily.     meclizine (ANTIVERT) 25 MG tablet Take 25 mg by mouth 3 (three) times daily as needed for dizziness.     Misc Natural Products (ELDERBERRY IMMUNE COMPLEX) CHEW Chew 1 tablet by mouth daily.     Multiple Vitamins-Minerals (MULTIVITAMIN ADULTS) TABS Take 1 tablet by mouth daily.     ondansetron (ZOFRAN) 4 MG tablet Take 4 mg by mouth every 8 (eight) hours as needed for nausea or vomiting.     predniSONE (DELTASONE) 1 MG tablet Take 3mg  + 5mg  tablet x 1 month (8mg ), then reduce to 7mg  daily. (Patient taking differently: 7mg  daily.) 90 tablet 3   predniSONE (DELTASONE) 5 MG tablet Take 1 tablet (5 mg total) by mouth daily with breakfast. (Patient taking differently: Take 5 mg by mouth daily with breakfast. Total 7 mg daily) 90 tablet 1   pyridostigmine (MESTINON) 60 MG tablet TAKE 1 TABLET BY MOUTH 3 TIMES DAILY. 270 tablet 1   VITAMIN D PO Take by mouth. 600 mg daily     pantoprazole (PROTONIX) 40 MG tablet Take 1 tablet (40 mg total) by mouth 2 (two) times daily. 180 tablet 4   No current facility-administered medications on file prior to visit.    Allergies: No Known Allergies  Vital Signs:  BP 124/70   Pulse (!) 105   Ht 6\' 1"  (1.854 m)   Wt 182 lb (82.6 kg)   SpO2 99%   BMI 24.01 kg/m   Neurological Exam: MENTAL STATUS including orientation to time, place, person, recent and remote memory, attention span and concentration, language, and fund of knowledge is normal.  Speech is not dysarthric.  CRANIAL NERVES:    Pupils equal round and reactive to light.  Normal conjugate, extra-ocular eye  movements in all directions of gaze.  Minimal right ptosis at rest with no worsening with sustained upgaze. Face is symmetric. Orbicularis oculi 5/5, orbicularis oris 5/5, buccinator 5/5.  Palate elevates symmetrically.  Tongue is midline and strength is 5/5  MOTOR:  Motor strength is 5/5 in all extremities, including bilateral hip flexors (improved)  No atrophy, fasciculations or abnormal movements.  No pronator drift.  Tone is normal.    COORDINATION/GAIT:  He is able to stand up without pushing off with arms.  Gait is mildly wide-based, unassisted.    Data: Labs 09/13/2022:  AChR antibody binding 3.73*   MRI brain wwo contrast 09/12/2022: 1. No acute intracranial pathology or other finding to  explain the patient's symptoms. 2. 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 3. On initial injection of intravenous contrast, the patient's IV infiltrative and 7.5 cc con trast was injected. Per the technologist, there was mild swelling at the injection site but no significant pain or neurologic symptoms. Recommend monitoring of the IV site while the patient is in the ED/inpatient, with instructions to return if progressive pain or neurologic symptoms develop.   CT chest wo contrast 09/15/2022:  No evidence of thymoma. Status post left upper lobectomy. No evidence of recurrent or metastatic disease. Moderate hiatal hernia with mildly irregular left lateral wall thickening, poorly evaluated. Consider endoscopy for further evaluation.   Aortic Atherosclerosis (ICD10-I70.0) and Emphysema (ICD10-J43.9).   Total time spent reviewing records, interview, history/exam, documentation, and coordination of care on day of encounter:  30 min   Thank you for allowing me to participate in patient's care.  If I can answer any additional questions, I would be pleased to do so.    Sincerely,    Ellen Mayol K. Allena Katz, DO

## 2023-02-21 NOTE — Patient Instructions (Signed)
Continue prednisone 7mg  until the end of January.  Starting February, reduce prednisone to 6mg  x1 month, then 5mg  daily  Continue mestinon 60mg  three times daily  If you have any new double vision or droopiness of the eyelids, please contact my office.

## 2023-03-12 ENCOUNTER — Telehealth: Payer: Self-pay | Admitting: Neurology

## 2023-03-12 NOTE — Telephone Encounter (Signed)
 I'm sorry to hear this. It is okay take the steroid dose pack.  I recommend that he temporarily stop the prednisone  5mg  that I prescribe while he is taking what urgent care prescribed.  Once he has finished is steroid course, he may restart predisone 5mg  daily.

## 2023-03-12 NOTE — Telephone Encounter (Signed)
 Called and spoke to patients granddaughter Joseph [name] and informed her of Dr. Basilio Both response and recommendations.  It is okay take the steroid dose pack.  Dr. Lydia Sams recommends that he temporarily stop the prednisone  5mg  that she prescribe while he is taking what urgent care prescribed.  Once he has finished is steroid course, he may restart predisone 5mg  daily.        Also informed Joseph [name] that if they hear anything from [facility]  we are working on exploring financial assistance for patient.   Patient s granddaughter verbalize understanding and will relay message to patient and family. There were no further questions or concerns.

## 2023-03-12 NOTE — Telephone Encounter (Signed)
 Pt's granddaughter called in and left another message with the access nurse on 03/10/23. Caller states they spoke with a nurse earlier about her grandfather for symptoms of leg pain that radiated to his toe. They went to UC and was dx with a pinched nerve and prescribed a steroid dose pack. They are wondering if he is able to take this medication because he is on chronic steroid for MG which they have been tapering down, it is at 5 mg now. The dose pack is a 21 count, 4mg .  Full access nurse report is in Dr. Basilio Both box

## 2023-03-12 NOTE — Telephone Encounter (Signed)
 Pt's granddaughter called in and left a message with the access nurse on 03/10/23. 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 She would like to know what he is able to take instead for this, that would not interfere with his Myasthenia. He is in pain that is radiating from his calf down to his toe on his left side.   Full access nurse report is in Dr. Basilio Both box

## 2023-03-12 NOTE — Telephone Encounter (Signed)
 Granddaughter is not on [place]

## 2023-03-24 ENCOUNTER — Other Ambulatory Visit: Payer: Self-pay | Admitting: Neurology

## 2023-04-02 ENCOUNTER — Telehealth: Payer: Self-pay | Admitting: Pharmacy Technician

## 2023-04-02 ENCOUNTER — Telehealth: Payer: Self-pay | Admitting: Neurology

## 2023-04-02 DIAGNOSIS — G7001 Myasthenia gravis with (acute) exacerbation: Secondary | ICD-10-CM

## 2023-04-02 NOTE — Telephone Encounter (Signed)
 Pt's daughter called in and left a message stating the pt has been having some myasthenia gravis symptoms. She thinks he needs to start the injections again.

## 2023-04-02 NOTE — Telephone Encounter (Signed)
 Dr. Allena Katz,  Good News [facility])  I have spoken with [name] daughter Joseph [name] and she has informed me that he has been approved for [facility] Patient Assistance. She had several questions in regards to his treatment.  She was instructed to reach out to you for concerns/questions.  Joseph [name]

## 2023-04-02 NOTE — Telephone Encounter (Signed)
 I'm sorry to hear this.  He responded very well to [facility], so will get in touch with infusion center to see if he can be scheduled asap.  Thank you.

## 2023-04-02 NOTE — Telephone Encounter (Signed)
 Called and spoke to [place] and she informed me for about a week patient has been having some trouble with his myasthenia gravis. Last week patient was having problems swallowing and choking. Also, they have noticed patients eyes are watering a lot and is not comfortable eating.   A case manager has called them from [facility] and patient has gotten a Engineer, manufacturing. They think its time to start Vy[facility]gart. Informed patients daughter I will send this message over to Dr. Allena Katz and give them a call back.

## 2023-04-02 NOTE — Telephone Encounter (Signed)
 Called patients daughter [name] and informed her that Dr. Allena Katz is sorry to hear this.  He responded very well to [facility], so will get in touch with infusion center to see if he can be scheduled asap.    Patients daughter verbalized understanding and had no further questions or concerns.

## 2023-04-05 ENCOUNTER — Encounter: Payer: Self-pay | Admitting: Neurology

## 2023-04-05 ENCOUNTER — Other Ambulatory Visit: Payer: Self-pay | Admitting: Neurology

## 2023-04-05 DIAGNOSIS — G7001 Myasthenia gravis with (acute) exacerbation: Secondary | ICD-10-CM | POA: Insufficient documentation

## 2023-04-05 NOTE — Telephone Encounter (Signed)
 Thank you, I entered the orders (you may need to check that they look good).

## 2023-04-05 NOTE — Addendum Note (Signed)
 Addended by: Glendale Chard on: 04/05/2023 11:27 AM   Modules accepted: Orders

## 2023-04-05 NOTE — Telephone Encounter (Addendum)
 Dr. Allena Katz, He has completed his last cycle of  4 doses.  We will enter a new treatment plan and we will schedule him asap.(Also - he now has been approved for financial assistance)  Auth Submission: APPROVED Site of care: Site of care: [facility] Payer: [employer] ADVT Medication & CPT/J Code(s) submitted:  [facility] HYTRUL [id number] Route of submission (phone, fax, portal):  Phone #[phone] Fax # Auth type: Buy/Bill PB Units/visits requested: 4 DOSES Reference number: [id number] Approval from: 04/05/23 to 07/04/23

## 2023-04-09 ENCOUNTER — Ambulatory Visit (INDEPENDENT_AMBULATORY_CARE_PROVIDER_SITE_OTHER)

## 2023-04-09 VITALS — BP 118/69 | HR 64 | Temp 98.1°F | Resp 16 | Ht 73.0 in | Wt 179.6 lb

## 2023-04-09 DIAGNOSIS — G7001 Myasthenia gravis with (acute) exacerbation: Secondary | ICD-10-CM

## 2023-04-09 MED ORDER — EFGARTIGIMOD ALFA-HYALUR-QVFC 180-2000 MG-UNIT/ML ~~LOC~~ SOLN
1008.0000 mg | Freq: Once | SUBCUTANEOUS | Status: AC
  Administered 2023-04-09: 1008 mg via SUBCUTANEOUS
  Filled 2023-04-09: qty 5.6

## 2023-04-09 NOTE — Progress Notes (Signed)
 Diagnosis: Myasthenia Gravis  Provider:  Chilton Greathouse MD  Procedure: Injection  Vyvgart Hytrulo, Dose: 1,008 mg , Site: subcutaneous, Number of injections: 1  Injection Site(s): Left lower quad. abdomen  Post Care: Observation period completed  Discharge: Condition: Good, Destination: Home . AVS Provided  Performed by:  Adriana Mccallum, RN

## 2023-04-09 NOTE — Patient Instructions (Signed)
 Efgartigimod Alfa Injection What is this medication? EFGARTIGIMOD ALFA (ef gar TIG i mod AL fa) treats myasthenia gravis, a condition that causes muscles to easily weaken or fatigue. It works by decreasing muscle weakness and loss of movement. This medicine may be used for other purposes; ask your health care provider or pharmacist if you have questions. COMMON BRAND NAME(S): VYVGART What should I tell my care team before I take this medication? They need to know if you have any of these conditions: Immune system problems Infection Kidney disease An unusual or allergic reaction to efgartigimod alfa, other medications, foods, dyes, or preservatives Pregnant or trying to get pregnant Breast-feeding How should I use this medication? This medication is infused into a vein. It is given by your care team in a hospital or clinic setting. Talk to your care team about the use of this medication in children. Special care may be needed. Overdosage: If you think you have taken too much of this medicine contact a poison control center or emergency room at once. NOTE: This medicine is only for you. Do not share this medicine with others. What if I miss a dose? Keep appointments for follow-up doses. It is important not to miss your dose. Call your care team if you are unable to keep an appointment. What may interact with this medication? Eculizumab Immune globulin Live virus vaccines Ravulizumab Rozanolixizumab This list may not describe all possible interactions. Give your health care provider a list of all the medicines, herbs, non-prescription drugs, or dietary supplements you use. Also tell them if you smoke, drink alcohol, or use illegal drugs. Some items may interact with your medicine. What should I watch for while using this medication? Visit your care team for regular checks on your progress. Tell your care team if your symptoms do not start to get better or if they get worse. Your condition  will be monitored carefully while you are receiving this medication. This medication may increase your risk of getting an infection. Call your care team for advice if you get a fever, chills, sore throat, or other symptoms of a cold or flu. Do not treat yourself. Try to avoid being around people who are sick. What side effects may I notice from receiving this medication? Side effects that you should report to your care team as soon as possible: Allergic reactions or angioedema--skin rash, itching or hives, swelling of the face, eyes, lips, tongue, arms, or legs, trouble swallowing or breathing Infection--fever, chills, cough, sore throat, wounds that don't heal, pain or trouble when passing urine, general feeling of discomfort or being unwell Infusion reactions--chest pain, shortness of breath or trouble breathing, feeling faint or lightheaded Side effects that usually do not require medical attention (report these to your care team if they continue or are bothersome): Burning or tingling sensation in hands or feet Headache Muscle pain This list may not describe all possible side effects. Call your doctor for medical advice about side effects. You may report side effects to FDA at 1-800-FDA-1088. Where should I keep my medication? This medication is given in a hospital or clinic. It will not be stored at home. NOTE: This sheet is a summary. It may not cover all possible information. If you have questions about this medicine, talk to your doctor, pharmacist, or health care provider.  2024 Elsevier/Gold Standard (2022-12-29 00:00:00)

## 2023-04-16 ENCOUNTER — Ambulatory Visit (INDEPENDENT_AMBULATORY_CARE_PROVIDER_SITE_OTHER)

## 2023-04-16 VITALS — BP 108/66 | HR 85 | Temp 98.0°F | Resp 22 | Ht 73.0 in | Wt 183.4 lb

## 2023-04-16 DIAGNOSIS — G7001 Myasthenia gravis with (acute) exacerbation: Secondary | ICD-10-CM | POA: Diagnosis not present

## 2023-04-16 MED ORDER — EFGARTIGIMOD ALFA-HYALUR-QVFC 180-2000 MG-UNIT/ML ~~LOC~~ SOLN
1008.0000 mg | Freq: Once | SUBCUTANEOUS | Status: AC
  Administered 2023-04-16: 1008 mg via SUBCUTANEOUS

## 2023-04-16 NOTE — Progress Notes (Signed)
 Diagnosis: Myasthenia Gravis  Provider:  Chilton Greathouse MD  Procedure: Injection  Vyvgart, Dose: 1008mg  , Site: subcutaneous, Number of injections: 1  Injection Site(s): Right upper quad. abdomen  Post Care: Observation period completed  Discharge: Condition: Good, Destination: Home . AVS Provided  Performed by:  Nat Math, RN

## 2023-04-23 ENCOUNTER — Ambulatory Visit (INDEPENDENT_AMBULATORY_CARE_PROVIDER_SITE_OTHER)

## 2023-04-23 VITALS — BP 124/73 | HR 81 | Temp 98.0°F | Resp 18 | Ht 73.0 in | Wt 182.4 lb

## 2023-04-23 DIAGNOSIS — G7001 Myasthenia gravis with (acute) exacerbation: Secondary | ICD-10-CM | POA: Diagnosis not present

## 2023-04-23 MED ORDER — EFGARTIGIMOD ALFA-HYALUR-QVFC 180-2000 MG-UNIT/ML ~~LOC~~ SOLN
1008.0000 mg | Freq: Once | SUBCUTANEOUS | Status: AC
  Administered 2023-04-23: 1008 mg via SUBCUTANEOUS
  Filled 2023-04-23: qty 5.6

## 2023-04-23 NOTE — Progress Notes (Signed)
 Diagnosis: Myasthenia Gravis  Provider:  Chilton Greathouse MD  Procedure: Injection  Vyvgart Hytrulo, Dose: 1008mg  , Site: subcutaneous, Number of injections: 1  Injection Site(s): Left lower quad. abdomen  Post Care: Observation period completed  Discharge: Condition: Good, Destination: Home . AVS Provided  Performed by:  Adriana Mccallum, RN

## 2023-04-25 ENCOUNTER — Other Ambulatory Visit: Payer: Self-pay | Admitting: Neurology

## 2023-04-25 ENCOUNTER — Ambulatory Visit: Payer: PPO | Admitting: Neurology

## 2023-04-25 ENCOUNTER — Encounter: Payer: Self-pay | Admitting: Neurology

## 2023-04-25 VITALS — BP 125/63 | HR 91 | Ht 73.0 in | Wt 181.0 lb

## 2023-04-25 DIAGNOSIS — G7000 Myasthenia gravis without (acute) exacerbation: Secondary | ICD-10-CM

## 2023-04-25 MED ORDER — PREDNISONE 5 MG PO TABS: 5.0000 mg | ORAL_TABLET | Freq: Every day | ORAL | 1 refills | Status: DC

## 2023-04-25 NOTE — Progress Notes (Signed)
 Follow-up Visit   Date: 04/25/2023    Joseph [name] MRN: [id number] DOB: 06-03-1931    Joseph [name] is a 89 y.o. left-handed Caucasian male with diet controlled diabetes mellitus, GERD, hypertension, history of prostate cancer, lung cancer s/p left upper lobe resection (2019) returning to the clinic for follow-up of seropositive myasthenia gravis.  The patient was accompanied to the clinic by daughter who also provides collateral information.    IMPRESSION/PLAN: Seropositive generalized myasthenia gravis without exacerbation, thymoma negative (08/2022).  Exam is improved with no bulbar, ocular, or limb weakness.  There is no evidence of disease manifestation. The highest dose of prednisone is 10mg .  Vyvgart Hytrulo started in September and received again in November.  He responds extremely well to Vyvgart Hytrulo.  - He is scheduled to have another infusion for Vyvgart Hytrulo next week.  We will plan to redose in 8 weeks.   - Continue mestinon 60mg  three times daily  - Continue prednisone 5mg  daily  Return to clinic in 3 months  --------------------------------------------- History of present illness: Early August 2024, he began having double vision and left eye ptosis.  Daughter went to the ER due to concern of stroke where CT head was negative.  He saw Dr. Dione Booze the following day who felt that symptoms were due to myasthenia gravis so urged him to go to the ER.  He went to [facility] where MRI brain did not show stroke.  There was high clinical suspicion for myasthenia so he was started on mestinon 30mg  three times daily which did help his difficulty with swallowing and shortness of breath.  Unfortunately, there has been no improvement with droopiness of the eyelids.  Eyelids are slightly better for about 15 minutes in the morning, but then quickly start to feel heavy again. He does not notice double vision much because eyes stay closed.  He is very frustrated by not being able  to see or keep his eyes open.    UPDATE 10/25/2022:  He is here for follow-up visit.  He has been on prednisone 10mg  daily x 2 weeks, and received his second infusion of Vyvgart Hytrulo yesterday.  Over the past 3-4 days, he noticed that his right eyelid was opening up and staying open for longer periods of time.  When tired and in the evening, it continues to get droopy.  His speech and swallow is normal.  He has some weakness in the legs and uses a walker. Overall, he is feeling much better.  No new complaints.  He has been monitor blood sugar at home which is staying 140-180s.    UPDATE 11/29/2022:  He is here for for follow-up visit.  He reports doing great and denies any ptosis, facial weakness, slurred, or difficulty with swallowing.  He is back to doing his usual activities and mood is much better.  He is scheduled for Vyvgart Hytrullo next month.   UPDATE 02/21/2023:  He is here for follow-up visit.  He has been doing well on prednisone 7mg  daily. He denies double vision, droopiness of the eyelids, speech/swallowing difficulty, or weakness.   He completed Vyvgart Hytrulo with last infusion on 12/10.  He was billed $3200 for his infusions last year and was told it would cost him $3400 with the first injection.  He was having "quivering' of the left leg and had steroid injection to the left hip after which his symptoms have signficantly improved.    UPDATE 04/25/2023:  He started having difficulty  with swallowing and started droopy again around late February.  He was approved for Vyvgart Hytrulo and within one week of his first infusion, he started having improvement.  He denies any current double vision, droopiness of the eyelids, or limb weakness. He remains on prednisone 5mg  daily.   Medications:  Current Outpatient Medications on File Prior to Visit  Medication Sig Dispense Refill   acetaminophen (TYLENOL) 500 MG tablet Take 500-1,000 mg by mouth every 8 (eight) hours as needed for moderate pain  or headache.     amoxicillin (AMOXIL) 500 MG capsule Take 2,000 mg by mouth See admin instructions. Take 4 capsules (2000 mg) by mouth 1 hours prior to procedure.     Ascorbic Acid (VITAMIN C PO) Take 1 packet by mouth daily.     aspirin EC 81 MG tablet Take 81 mg by mouth as directed. MWF     Calcium Carbonate (CALCIUM 600 PO) Take by mouth.     hydrALAZINE (APRESOLINE) 25 MG tablet Take 2 tablets (50 mg total) by mouth 3 (three) times daily. 180 tablet 1   ipratropium (ATROVENT) 0.06 % nasal spray Place 1 spray into both nostrils 3 (three) times daily as needed (allergies).     lisinopril (ZESTRIL) 40 MG tablet Take 1 tablet (40 mg total) by mouth daily. 90 tablet 1   loratadine (CLARITIN) 10 MG tablet Take 10 mg by mouth daily.     meclizine (ANTIVERT) 25 MG tablet Take 25 mg by mouth 3 (three) times daily as needed for dizziness.     Misc Natural Products (ELDERBERRY IMMUNE COMPLEX) CHEW Chew 1 tablet by mouth daily.     Multiple Vitamins-Minerals (MULTIVITAMIN ADULTS) TABS Take 1 tablet by mouth daily.     ondansetron (ZOFRAN) 4 MG tablet Take 4 mg by mouth every 8 (eight) hours as needed for nausea or vomiting.     pantoprazole (PROTONIX) 40 MG tablet Take 1 tablet (40 mg total) by mouth 2 (two) times daily. 180 tablet 4   predniSONE (DELTASONE) 5 MG tablet Take 1 tablet (5 mg total) by mouth daily with breakfast. (Patient taking differently: Take 5 mg by mouth daily with breakfast. Total 5 mg daily) 90 tablet 1   pyridostigmine (MESTINON) 60 MG tablet TAKE 1 TABLET BY MOUTH 3 TIMES DAILY. 270 tablet 1   VITAMIN D PO Take by mouth. 600 mg daily     predniSONE (DELTASONE) 1 MG tablet Take 3mg  + 5mg  tablet x 1 month (8mg ), then reduce to 7mg  daily. (Patient not taking: Reported on 04/25/2023) 90 tablet 3   No current facility-administered medications on file prior to visit.    Allergies: No Known Allergies  Vital Signs:  BP 125/63   Pulse 91   Ht 6\' 1"  (1.854 m)   Wt 181 lb (82.1 kg)    SpO2 99%   BMI 23.88 kg/m   Neurological Exam: MENTAL STATUS including orientation to time, place, person, recent and remote memory, attention span and concentration, language, and fund of knowledge is normal.  Speech is not dysarthric.  CRANIAL NERVES:    Pupils equal round and reactive to light.  Normal conjugate, extra-ocular eye movements in all directions of gaze.  Minimal right ptosis at rest with no worsening with sustained upgaze. Face is symmetric. Orbicularis oculi 5/5, orbicularis oris 5/5, buccinator 5/5.  Palate elevates symmetrically.  Tongue is midline and strength is 5/5  MOTOR:  Motor strength is 5/5 in all extremities, including bilateral hip flexors.  No atrophy,  fasciculations or abnormal movements.  No pronator drift.  Tone is normal.    COORDINATION/GAIT:  He is able to stand up without pushing off with arms.  Gait is mildly wide-based, unassisted.    Data: Labs 09/13/2022:  AChR antibody binding 3.73*   MRI brain wwo contrast 09/12/2022: 1. No acute intracranial pathology or other finding to explain the patient's symptoms. 2. 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 3. On initial injection of intravenous contrast, the patient's IV infiltrative and 7.5 cc con trast was injected. Per the technologist, there was mild swelling at the injection site but no significant pain or neurologic symptoms. Recommend monitoring of the IV site while the patient is in the ED/inpatient, with instructions to return if progressive pain or neurologic symptoms develop.   CT chest wo contrast 09/15/2022:  No evidence of thymoma. Status post left upper lobectomy. No evidence of recurrent or metastatic disease. Moderate hiatal hernia with mildly irregular left lateral wall thickening, poorly evaluated. Consider endoscopy for further evaluation.   Aortic Atherosclerosis (ICD10-I70.0) and Emphysema (ICD10-J43.9).     Thank you for allowing me to participate in patient's care.  If I can answer any additional questions, I would be pleased to do so.    Sincerely,    Bonnie Roig K. Allena Katz, DO

## 2023-04-30 ENCOUNTER — Ambulatory Visit (INDEPENDENT_AMBULATORY_CARE_PROVIDER_SITE_OTHER)

## 2023-04-30 VITALS — BP 111/63 | HR 99 | Temp 98.0°F | Resp 16 | Ht 73.0 in | Wt 176.8 lb

## 2023-04-30 DIAGNOSIS — G7001 Myasthenia gravis with (acute) exacerbation: Secondary | ICD-10-CM

## 2023-04-30 MED ORDER — EFGARTIGIMOD ALFA-HYALUR-QVFC 180-2000 MG-UNIT/ML ~~LOC~~ SOLN
1008.0000 mg | Freq: Once | SUBCUTANEOUS | Status: AC
Start: 2023-04-30 — End: 2023-04-30
  Administered 2023-04-30: 1008 mg via SUBCUTANEOUS

## 2023-04-30 NOTE — Progress Notes (Signed)
 Diagnosis: Myasthenia Gravis  Provider:  Chilton Greathouse MD  Procedure: Injection  Vyvgart Hytrulo (efgartigimod alfa-Hyalur-qvfc), Dose: 1008 mg , Site: subcutaneous, Number of injections: 1  Injection Site(s): Right lower quad. abdomne  Post Care: Observation period completed  Discharge: Condition: Good, Destination: Home . AVS Provided  Performed by:  Rico Ala, LPN

## 2023-05-16 ENCOUNTER — Other Ambulatory Visit: Payer: Self-pay | Admitting: Neurology

## 2023-06-04 ENCOUNTER — Other Ambulatory Visit: Payer: Self-pay | Admitting: Neurology

## 2023-06-04 ENCOUNTER — Telehealth: Payer: Self-pay | Admitting: Neurology

## 2023-06-04 NOTE — Telephone Encounter (Signed)
 Dr. Lydia Sams, We will enter a new treatment and submit a new auth.  Patient will be scheduled once Siegfried Dress is approved.  Auth Submission: PENDING Site of care: Site of care: [facility] Payer: [employer] ADV Medication & CPT/J Code(s) submitted: VYVGRART HYTRULLO Route of submission (phone, fax, portal):  Phone # Fax # Auth type: Buy/Bill PB Units/visits requested: X4 DOSES Reference number:  Approval from:  to

## 2023-06-04 NOTE — Telephone Encounter (Signed)
 Left a VM stating that patient was to have the Vyvgart  injection starting at the end of may and they have not heard anything    Please call

## 2023-06-05 ENCOUNTER — Telehealth: Payer: Self-pay | Admitting: Neurology

## 2023-06-05 NOTE — Telephone Encounter (Signed)
 Patients daughter penny would like a call back to discuss his Joseph [name]

## 2023-06-06 NOTE — Telephone Encounter (Signed)
 Per Laqueta Plane in previous encounter:  Patient will be scheduled once Siegfried Dress is approved. Auth Submission: PENDING   Called and spoke to patients daughter and informed her that patients PA is still pending from the 06/04/23 encounter. I informed patients daughter that as soon as we hear anything we will let them know immediately. Patients daughter thanked me for the call and had no further questions or concerns.

## 2023-06-07 ENCOUNTER — Telehealth: Payer: Self-pay | Admitting: Pharmacy Technician

## 2023-06-07 ENCOUNTER — Encounter: Payer: Self-pay | Admitting: Neurology

## 2023-06-07 NOTE — Telephone Encounter (Signed)
 Auth Submission: APPROVED Site of care: Site of care: [facility] Payer: [employer] Medication & CPT/J Code(s) submitted:  VYVGART  HYTRULLO [id number] Route of submission (phone, fax, portal):  Phone # Fax # Auth type: Buy/Bill PB Units/visits requested: WKLY X 4 DOSES Reference number: [id number] Approval from: 06/06/23 to 09/04/23

## 2023-06-26 ENCOUNTER — Ambulatory Visit (INDEPENDENT_AMBULATORY_CARE_PROVIDER_SITE_OTHER)

## 2023-06-26 VITALS — BP 91/64 | HR 74 | Temp 98.2°F | Resp 18 | Ht 73.0 in | Wt 183.0 lb

## 2023-06-26 DIAGNOSIS — G7001 Myasthenia gravis with (acute) exacerbation: Secondary | ICD-10-CM | POA: Diagnosis not present

## 2023-06-26 MED ORDER — EFGARTIGIMOD ALFA-HYALUR-QVFC 180-2000 MG-UNIT/ML ~~LOC~~ SOLN
1008.0000 mg | Freq: Once | SUBCUTANEOUS | Status: AC
  Administered 2023-06-26: 1008 mg via SUBCUTANEOUS

## 2023-06-26 NOTE — Progress Notes (Signed)
 Diagnosis: Myasthenia Gravis  Provider:  Mannam, Praveen MD  Procedure: Injection  Vyvgart , Dose: 1008mg , Site: subcutaneous, Number of injections: 1  Injection Site(s): Left lower quad. abdomen  Post Care: Observation period completed  Discharge: Condition: Good, Destination: Home . AVS Provided  Performed by:  Rachelle Bue, RN

## 2023-07-03 ENCOUNTER — Ambulatory Visit (INDEPENDENT_AMBULATORY_CARE_PROVIDER_SITE_OTHER)

## 2023-07-03 VITALS — BP 101/66 | HR 114 | Temp 98.1°F | Resp 16 | Ht 73.0 in | Wt 180.4 lb

## 2023-07-03 DIAGNOSIS — G7001 Myasthenia gravis with (acute) exacerbation: Secondary | ICD-10-CM | POA: Diagnosis not present

## 2023-07-03 MED ORDER — EFGARTIGIMOD ALFA-HYALUR-QVFC 180-2000 MG-UNIT/ML ~~LOC~~ SOLN
1008.0000 mg | Freq: Once | SUBCUTANEOUS | Status: AC
  Administered 2023-07-03: 1008 mg via SUBCUTANEOUS
  Filled 2023-07-03: qty 5.6

## 2023-07-03 NOTE — Progress Notes (Signed)
 Diagnosis: Myasthenia Gravis  Provider:  Mannam, Praveen MD  Procedure: Injection  Vyvgart  Hytrulo, Dose: 1008mg , Site: subcutaneous, Number of injections: 1  Injection Site(s): Right lower quad. abdomne  Post Care: Observation period completed  Discharge: Condition: Good, Destination: Home . AVS Provided  Performed by:  Star East, LPN

## 2023-07-10 ENCOUNTER — Ambulatory Visit

## 2023-07-10 VITALS — BP 122/74 | HR 71 | Temp 98.0°F | Resp 20 | Ht 73.0 in | Wt 179.4 lb

## 2023-07-10 DIAGNOSIS — G7001 Myasthenia gravis with (acute) exacerbation: Secondary | ICD-10-CM

## 2023-07-10 MED ORDER — EFGARTIGIMOD ALFA-HYALUR-QVFC 180-2000 MG-UNIT/ML ~~LOC~~ SOLN
1008.0000 mg | Freq: Once | SUBCUTANEOUS | Status: AC
  Administered 2023-07-10: 1008 mg via SUBCUTANEOUS

## 2023-07-10 NOTE — Progress Notes (Signed)
 Diagnosis: Myasthenia gravis with exacerbation   Provider:  Mannam, Praveen MD  Procedure: Injection  Vyvgart  Hytrulo, Dose: 1008 mg, Site: subcutaneous, Number of injections: 1  Injection Site(s): Left lower quad. abdomen  Post Care: left lower quad abdominal injection  Discharge: Condition: Good, Destination: Home . AVS Provided  Performed by:  Lauran Pollard, LPN

## 2023-07-17 ENCOUNTER — Ambulatory Visit

## 2023-07-17 VITALS — BP 113/70 | HR 112 | Temp 97.9°F | Resp 20 | Ht 73.0 in | Wt 180.4 lb

## 2023-07-17 DIAGNOSIS — G7001 Myasthenia gravis with (acute) exacerbation: Secondary | ICD-10-CM | POA: Diagnosis not present

## 2023-07-17 MED ORDER — EFGARTIGIMOD ALFA-HYALUR-QVFC 180-2000 MG-UNIT/ML ~~LOC~~ SOLN
1008.0000 mg | Freq: Once | SUBCUTANEOUS | Status: AC
  Administered 2023-07-17: 1008 mg via SUBCUTANEOUS
  Filled 2023-07-17: qty 5.6

## 2023-07-17 NOTE — Progress Notes (Signed)
 Diagnosis: Myasthenia Gravis  Provider:  Mannam, Praveen MD  Procedure: Injection  Vyvgart , Dose: 1008mg , Site: subcutaneous, Number of injections: 1  Injection Site(s): Right lower quad. abdomne  Post Care: Observation period completed  Discharge: Condition: Good, Destination: Home . AVS Provided  Performed by:  Shirly Dow, RN

## 2023-07-25 ENCOUNTER — Emergency Department (HOSPITAL_COMMUNITY)

## 2023-07-25 ENCOUNTER — Telehealth (HOSPITAL_COMMUNITY): Payer: Self-pay | Admitting: Pharmacy Technician

## 2023-07-25 ENCOUNTER — Inpatient Hospital Stay (HOSPITAL_COMMUNITY)
Admission: EM | Admit: 2023-07-25 | Discharge: 2023-07-29 | DRG: 309 | Disposition: A | Discharge status: Home / Self Care | Attending: Family Medicine | Admitting: Family Medicine

## 2023-07-25 ENCOUNTER — Other Ambulatory Visit: Payer: Self-pay

## 2023-07-25 ENCOUNTER — Encounter: Payer: Self-pay | Admitting: Neurology

## 2023-07-25 ENCOUNTER — Ambulatory Visit: Admitting: Neurology

## 2023-07-25 ENCOUNTER — Other Ambulatory Visit (HOSPITAL_COMMUNITY): Payer: Self-pay

## 2023-07-25 ENCOUNTER — Encounter (HOSPITAL_COMMUNITY): Payer: Self-pay

## 2023-07-25 VITALS — BP 122/62 | HR 146 | Ht 73.0 in | Wt 184.0 lb

## 2023-07-25 DIAGNOSIS — Z8546 Personal history of malignant neoplasm of prostate: Secondary | ICD-10-CM

## 2023-07-25 DIAGNOSIS — Z96642 Presence of left artificial hip joint: Secondary | ICD-10-CM | POA: Diagnosis present

## 2023-07-25 DIAGNOSIS — G7000 Myasthenia gravis without (acute) exacerbation: Secondary | ICD-10-CM | POA: Diagnosis not present

## 2023-07-25 DIAGNOSIS — Z7982 Long term (current) use of aspirin: Secondary | ICD-10-CM

## 2023-07-25 DIAGNOSIS — D631 Anemia in chronic kidney disease: Secondary | ICD-10-CM | POA: Diagnosis present

## 2023-07-25 DIAGNOSIS — Z8673 Personal history of transient ischemic attack (TIA), and cerebral infarction without residual deficits: Secondary | ICD-10-CM

## 2023-07-25 DIAGNOSIS — K220 Achalasia of cardia: Secondary | ICD-10-CM | POA: Diagnosis present

## 2023-07-25 DIAGNOSIS — I77819 Aortic ectasia, unspecified site: Secondary | ICD-10-CM | POA: Diagnosis present

## 2023-07-25 DIAGNOSIS — Z87442 Personal history of urinary calculi: Secondary | ICD-10-CM

## 2023-07-25 DIAGNOSIS — Z96653 Presence of artificial knee joint, bilateral: Secondary | ICD-10-CM | POA: Diagnosis present

## 2023-07-25 DIAGNOSIS — N1831 Chronic kidney disease, stage 3a: Secondary | ICD-10-CM | POA: Diagnosis present

## 2023-07-25 DIAGNOSIS — Z7901 Long term (current) use of anticoagulants: Secondary | ICD-10-CM | POA: Diagnosis not present

## 2023-07-25 DIAGNOSIS — I5022 Chronic systolic (congestive) heart failure: Secondary | ICD-10-CM | POA: Diagnosis present

## 2023-07-25 DIAGNOSIS — I3139 Other pericardial effusion (noninflammatory): Secondary | ICD-10-CM | POA: Diagnosis present

## 2023-07-25 DIAGNOSIS — Z85118 Personal history of other malignant neoplasm of bronchus and lung: Secondary | ICD-10-CM

## 2023-07-25 DIAGNOSIS — C61 Malignant neoplasm of prostate: Secondary | ICD-10-CM | POA: Diagnosis present

## 2023-07-25 DIAGNOSIS — R0602 Shortness of breath: Secondary | ICD-10-CM | POA: Diagnosis not present

## 2023-07-25 DIAGNOSIS — H9190 Unspecified hearing loss, unspecified ear: Secondary | ICD-10-CM | POA: Diagnosis present

## 2023-07-25 DIAGNOSIS — E119 Type 2 diabetes mellitus without complications: Secondary | ICD-10-CM

## 2023-07-25 DIAGNOSIS — I10 Essential (primary) hypertension: Secondary | ICD-10-CM | POA: Diagnosis not present

## 2023-07-25 DIAGNOSIS — E1122 Type 2 diabetes mellitus with diabetic chronic kidney disease: Secondary | ICD-10-CM | POA: Diagnosis present

## 2023-07-25 DIAGNOSIS — M1990 Unspecified osteoarthritis, unspecified site: Secondary | ICD-10-CM | POA: Diagnosis present

## 2023-07-25 DIAGNOSIS — K219 Gastro-esophageal reflux disease without esophagitis: Secondary | ICD-10-CM | POA: Diagnosis present

## 2023-07-25 DIAGNOSIS — I4891 Unspecified atrial fibrillation: Principal | ICD-10-CM | POA: Diagnosis present

## 2023-07-25 DIAGNOSIS — I081 Rheumatic disorders of both mitral and tricuspid valves: Secondary | ICD-10-CM | POA: Diagnosis present

## 2023-07-25 DIAGNOSIS — Z902 Acquired absence of lung [part of]: Secondary | ICD-10-CM

## 2023-07-25 DIAGNOSIS — I429 Cardiomyopathy, unspecified: Secondary | ICD-10-CM | POA: Diagnosis present

## 2023-07-25 DIAGNOSIS — Z7952 Long term (current) use of systemic steroids: Secondary | ICD-10-CM

## 2023-07-25 DIAGNOSIS — Z888 Allergy status to other drugs, medicaments and biological substances status: Secondary | ICD-10-CM

## 2023-07-25 DIAGNOSIS — Z79899 Other long term (current) drug therapy: Secondary | ICD-10-CM

## 2023-07-25 DIAGNOSIS — Z8249 Family history of ischemic heart disease and other diseases of the circulatory system: Secondary | ICD-10-CM

## 2023-07-25 DIAGNOSIS — R000 Tachycardia, unspecified: Secondary | ICD-10-CM | POA: Diagnosis not present

## 2023-07-25 DIAGNOSIS — I130 Hypertensive heart and chronic kidney disease with heart failure and stage 1 through stage 4 chronic kidney disease, or unspecified chronic kidney disease: Secondary | ICD-10-CM | POA: Diagnosis present

## 2023-07-25 LAB — CBC WITH DIFFERENTIAL/PLATELET
Abs Immature Granulocytes: 0.02 10*3/uL (ref 0.00–0.07)
Basophils Absolute: 0 10*3/uL (ref 0.0–0.1)
Basophils Relative: 0 %
Eosinophils Absolute: 0 10*3/uL (ref 0.0–0.5)
Eosinophils Relative: 0 %
HCT: 37.2 % — ABNORMAL LOW (ref 39.0–52.0)
Hemoglobin: 11.7 g/dL — ABNORMAL LOW (ref 13.0–17.0)
Immature Granulocytes: 0 %
Lymphocytes Relative: 7 %
Lymphs Abs: 0.5 10*3/uL — ABNORMAL LOW (ref 0.7–4.0)
MCH: 27 pg (ref 26.0–34.0)
MCHC: 31.5 g/dL (ref 30.0–36.0)
MCV: 85.7 fL (ref 80.0–100.0)
Monocytes Absolute: 0.6 10*3/uL (ref 0.1–1.0)
Monocytes Relative: 8 %
Neutro Abs: 6.4 10*3/uL (ref 1.7–7.7)
Neutrophils Relative %: 85 %
Platelets: 177 10*3/uL (ref 150–400)
RBC: 4.34 MIL/uL (ref 4.22–5.81)
RDW: 14 % (ref 11.5–15.5)
WBC: 7.6 10*3/uL (ref 4.0–10.5)
nRBC: 0 % (ref 0.0–0.2)

## 2023-07-25 LAB — BASIC METABOLIC PANEL WITH GFR
Anion gap: 13 (ref 5–15)
BUN: 18 mg/dL (ref 8–23)
CO2: 22 mmol/L (ref 22–32)
Calcium: 9.8 mg/dL (ref 8.9–10.3)
Chloride: 105 mmol/L (ref 98–111)
Creatinine, Ser: 1.31 mg/dL — ABNORMAL HIGH (ref 0.61–1.24)
GFR, Estimated: 52 mL/min — ABNORMAL LOW (ref >60)
Glucose, Bld: 143 mg/dL — ABNORMAL HIGH (ref 70–99)
Potassium: 4.3 mmol/L (ref 3.5–5.1)
Sodium: 140 mmol/L (ref 135–145)

## 2023-07-25 LAB — TSH: TSH: 1.483 u[IU]/mL (ref 0.350–4.500)

## 2023-07-25 LAB — BRAIN NATRIURETIC PEPTIDE: B Natriuretic Peptide: 235.2 pg/mL — ABNORMAL HIGH (ref 0.0–100.0)

## 2023-07-25 MED ORDER — ONDANSETRON HCL 4 MG PO TABS: 4.0000 mg | ORAL_TABLET | Freq: Four times a day (QID) | ORAL | Status: DC | PRN

## 2023-07-25 MED ORDER — AMIODARONE HCL IN DEXTROSE 360-4.14 MG/200ML-% IV SOLN
30.0000 mg/h | INTRAVENOUS | Status: DC
  Administered 2023-07-25 – 2023-07-27 (×4): 30 mg/h via INTRAVENOUS
  Filled 2023-07-25 (×2): qty 200

## 2023-07-25 MED ORDER — HEPARIN (PORCINE) 25000 UT/250ML-% IV SOLN
1200.0000 [IU]/h | INTRAVENOUS | Status: DC
  Administered 2023-07-25: 1200 [IU]/h via INTRAVENOUS
  Filled 2023-07-25: qty 250

## 2023-07-25 MED ORDER — MECLIZINE HCL 25 MG PO TABS: 25.0000 mg | ORAL_TABLET | Freq: Three times a day (TID) | ORAL | Status: DC | PRN

## 2023-07-25 MED ORDER — FLEET ENEMA RE ENEM: 1.0000 | ENEMA | Freq: Once | RECTAL | Status: DC | PRN

## 2023-07-25 MED ORDER — DILTIAZEM HCL 25 MG/5ML IV SOLN
10.0000 mg | Freq: Once | INTRAVENOUS | Status: AC
  Administered 2023-07-25: 10 mg via INTRAVENOUS
  Filled 2023-07-25: qty 5

## 2023-07-25 MED ORDER — PYRIDOSTIGMINE BROMIDE 60 MG PO TABS
60.0000 mg | ORAL_TABLET | Freq: Three times a day (TID) | ORAL | Status: DC
  Administered 2023-07-25 – 2023-07-29 (×12): 60 mg via ORAL
  Filled 2023-07-25 (×15): qty 1

## 2023-07-25 MED ORDER — BISACODYL 5 MG PO TBEC: 5.0000 mg | DELAYED_RELEASE_TABLET | Freq: Every day | ORAL | Status: DC | PRN

## 2023-07-25 MED ORDER — GUAIFENESIN 100 MG/5ML PO LIQD: 5.0000 mL | ORAL | Status: DC | PRN

## 2023-07-25 MED ORDER — HEPARIN BOLUS VIA INFUSION
4000.0000 [IU] | Freq: Once | INTRAVENOUS | Status: AC
  Administered 2023-07-25: 4000 [IU] via INTRAVENOUS
  Filled 2023-07-25: qty 4000

## 2023-07-25 MED ORDER — PREDNISONE 5 MG PO TABS
5.0000 mg | ORAL_TABLET | Freq: Every day | ORAL | Status: DC
  Administered 2023-07-26 – 2023-07-29 (×4): 5 mg via ORAL
  Filled 2023-07-25 (×4): qty 1

## 2023-07-25 MED ORDER — PYRIDOSTIGMINE BROMIDE 60 MG PO TABS
60.0000 mg | ORAL_TABLET | Freq: Three times a day (TID) | ORAL | Status: DC
  Filled 2023-07-25 (×2): qty 1

## 2023-07-25 MED ORDER — AMIODARONE HCL IN DEXTROSE 360-4.14 MG/200ML-% IV SOLN
60.0000 mg/h | INTRAVENOUS | Status: AC
  Administered 2023-07-25: 60 mg/h via INTRAVENOUS
  Filled 2023-07-25 (×2): qty 200

## 2023-07-25 MED ORDER — IPRATROPIUM-ALBUTEROL 0.5-2.5 (3) MG/3ML IN SOLN: 3.0000 mL | RESPIRATORY_TRACT | Status: DC | PRN

## 2023-07-25 MED ORDER — ASPIRIN 81 MG PO TBEC: 81.0000 mg | DELAYED_RELEASE_TABLET | ORAL | Status: DC

## 2023-07-25 MED ORDER — ACETAMINOPHEN 325 MG PO TABS
650.0000 mg | ORAL_TABLET | Freq: Four times a day (QID) | ORAL | Status: DC | PRN
Start: 2023-07-25 — End: 2023-07-29

## 2023-07-25 MED ORDER — PANTOPRAZOLE SODIUM 40 MG PO TBEC
40.0000 mg | DELAYED_RELEASE_TABLET | Freq: Two times a day (BID) | ORAL | Status: DC
  Administered 2023-07-25 – 2023-07-29 (×8): 40 mg via ORAL
  Filled 2023-07-25 (×8): qty 1

## 2023-07-25 MED ORDER — OXYCODONE HCL 5 MG PO TABS: 5.0000 mg | ORAL_TABLET | ORAL | Status: DC | PRN

## 2023-07-25 MED ORDER — NICOTINE 21 MG/24HR TD PT24: 21.0000 mg | MEDICATED_PATCH | Freq: Every day | TRANSDERMAL | Status: DC | PRN

## 2023-07-25 MED ORDER — DILTIAZEM HCL-DEXTROSE 125-5 MG/125ML-% IV SOLN (PREMIX)
5.0000 mg/h | INTRAVENOUS | Status: DC
  Administered 2023-07-25: 5 mg/h via INTRAVENOUS
  Filled 2023-07-25: qty 125

## 2023-07-25 MED ORDER — APIXABAN 5 MG PO TABS: 5.0000 mg | ORAL_TABLET | Freq: Two times a day (BID) | ORAL | Status: DC

## 2023-07-25 MED ORDER — SENNOSIDES-DOCUSATE SODIUM 8.6-50 MG PO TABS: 1.0000 | ORAL_TABLET | Freq: Every evening | ORAL | Status: DC | PRN

## 2023-07-25 MED ORDER — HYDROMORPHONE HCL 1 MG/ML IJ SOLN: 0.5000 mg | INTRAMUSCULAR | Status: DC | PRN

## 2023-07-25 MED ORDER — DILTIAZEM HCL 25 MG/5ML IV SOLN
20.0000 mg | Freq: Once | INTRAVENOUS | Status: AC
  Administered 2023-07-25: 20 mg via INTRAVENOUS
  Filled 2023-07-25: qty 5

## 2023-07-25 MED ORDER — TRAZODONE HCL 50 MG PO TABS
50.0000 mg | ORAL_TABLET | Freq: Every evening | ORAL | Status: DC | PRN
  Filled 2023-07-25: qty 1

## 2023-07-25 MED ORDER — ONDANSETRON HCL 4 MG/2ML IJ SOLN: 4.0000 mg | Freq: Four times a day (QID) | INTRAMUSCULAR | Status: DC | PRN

## 2023-07-25 MED ORDER — ACETAMINOPHEN 650 MG RE SUPP: 650.0000 mg | Freq: Four times a day (QID) | RECTAL | Status: DC | PRN

## 2023-07-25 MED ORDER — AMIODARONE LOAD VIA INFUSION
150.0000 mg | Freq: Once | INTRAVENOUS | Status: AC
  Administered 2023-07-25: 150 mg via INTRAVENOUS
  Filled 2023-07-25: qty 83.34

## 2023-07-25 MED ORDER — HYDRALAZINE HCL 20 MG/ML IJ SOLN: 10.0000 mg | INTRAMUSCULAR | Status: DC | PRN

## 2023-07-25 MED ORDER — DILTIAZEM LOAD VIA INFUSION
10.0000 mg | Freq: Once | INTRAVENOUS | Status: AC
  Administered 2023-07-25: 10 mg via INTRAVENOUS
  Filled 2023-07-25: qty 10

## 2023-07-25 NOTE — ED Notes (Signed)
 EKG given to Dr Mannie

## 2023-07-25 NOTE — Patient Instructions (Addendum)
 Your heart rate was very fast and rhythm felt irregular.  Please see your primary care doctor or urgent care to get 12-lead EKG to evaluate your heart rhythm.    We will plan the next Vyvgart  Hytrulo infusion in 9 weeks from last infusion.   Continue prednisone  5mg  and mestinon  60mg  three times daily

## 2023-07-25 NOTE — Consult Note (Addendum)
 Cardiology Consultation   Patient ID: Joseph [name] MRN: [id number]; DOB: [birth year]  Admit date: 07/25/2023 Date of Consult: 07/25/2023  PCP:  Joseph Opal, DO   [facility] Providers Cardiologist:  Joseph Schilling, MD      Patient Profile: Joseph [name] is a 89 y.o. male with a hx of myasthenia gravis, prior lung cancer s/p lobectomy 2019, hx of prostate cancer, DM, HTN who is being seen 07/25/2023 for the evaluation of Afib with RVR at the request of Dr. Caleen.  History of Present Illness: Joseph [name]  previously followed with oncology for lung cancer s/p lobectomy 2019. He is a never smoker. HTN controlled with 100 mg toprol  and 20 mg lisinopril  as of 2023. He does not have a cardiac history.   He developed fatigue and SOB x 2-3 days and was evaluated by neurology during a scheduled visit who suspected Afib RVR based on irregular tachycardia on exam and sent him to his PCP who confirmed with EKG. He was referred to [facility] for further management.   On arrival, EKG with Afib with VR 156. He was given IV cardizem for rate control - rates have improved to 110s-120s. BP marginal at 100/64. Cardizem gtt currently running at 5 mg/hr.   BP while I was in the room was 97 systolic with HR fluctuating in the 110s range.   Patient's daughter and son are bedside and help with history.  They report no prior cardiac history, he was diagnosed with myasthenia gravis August 2024.  He does have a history of possible esophageal dilation 20 years ago at [facility] in [place].  Of note, as part of his myasthenia gravis workup he had an esophagram 08/2022 that showed dysmotility.  He did have a narrowed appearance of the distal esophagus that may reflect postop changes from prior fundoplication, a stricture and/or recurrent achalasia.  He swallowed a 13 mm barium tablet but it did not pass beyond the distal esophagus.  He confirms DOE and fatigue, no chest pain. He is generally unaware of  palpitations. While he has slowed down with MG diagnosis, he continues to grocery shop and ride mow 7 acres.     Past Medical History:  Diagnosis Date   Arthritis    Cancer (HCC)    HX PROSTATE CANCER - MANY YRS AGO - ? RADIATION [place]. Lung cancer   Diabetes mellitus without complication (HCC)    DIET CONTROLLED   GERD (gastroesophageal reflux disease)    Graves disease    Hearing loss    History of hiatal hernia    History of kidney stones    History of nonunion of fracture    LEFT PROXIMAL FEMUR   Hypertension    Pneumonia    Seasonal allergies     Past Surgical History:  Procedure Laterality Date   CONVERSION TO TOTAL HIP Left 03/19/2014   Procedure: CONVERSION OF PREVIOUS HIP SURGERY TO LEFT TOTAL HIP ARTHROPLASTY;  Surgeon: [name] Melodi GAILS, MD;  Location: [facility];  Service: Orthopedics;  Laterality: Left;   CYSTOSCOPY WITH RETROGRADE PYELOGRAM, URETEROSCOPY AND STENT PLACEMENT Right 10/24/2020   Procedure: CYSTOSCOPY WITH RETROGRADE PYELOGRAM, URETEROSCOPY AND STENT PLACEMENT;  Surgeon: Cam Morene ORN, MD;  Location: [facility];  Service: Urology;  Laterality: Right;   FEMUR IM NAIL  01/18/2012   Procedure: INTRAMEDULLARY (IM) NAIL FEMORAL;  Surgeon: [name] Joseph Melodi, MD;  Location: [facility];  Service: Orthopedics;  Laterality: Left;   HARDWARE REMOVAL Left 01/13/2013   Procedure:  HARDWARE REVISION LEFT HIP AND EXCHANGE OF COMPRESSION SCREW;  Surgeon: [name] Joseph Moan, MD;  Location: [facility];  Service: Orthopedics;  Laterality: Left;   HELLER MYOTOMY     HIATAL HERNIA REPAIR  2011   JOINT REPLACEMENT  12/2011   BIL KNEE REPLACEMENTS   LOBECTOMY Left    left upper lobe   THORACOSCOPY     VIDEO BRONCHOSCOPY WITH ENDOBRONCHIAL NAVIGATION N/A 03/21/2017   Procedure: VIDEO BRONCHOSCOPY WITH ENDOBRONCHIAL NAVIGATION;  Surgeon: Joseph Elspeth BROCKS, MD;  Location: [facility] OR;  Service: Thoracic;  Laterality: N/A;     Home Medications:  Prior to Admission medications   Medication Sig  Start Date End Date Taking? Authorizing Provider  acetaminophen  (TYLENOL ) 500 MG tablet Take 500-1,000 mg by mouth every 8 (eight) hours as needed for moderate pain or headache.   Yes [provider]  amoxicillin (AMOXIL) 500 MG capsule Take 2,000 mg by mouth See admin instructions. Take 4 capsules (2000 mg) by mouth 1 hours prior to procedure.   Yes [provider]  Ascorbic Acid (VITAMIN C) POWD Take 1 packet by mouth daily.   Yes [provider]  aspirin  EC 81 MG tablet Take 81 mg by mouth every Monday, Wednesday, and Friday.   Yes [provider]  CALCIUM PO Take 1 tablet by mouth daily.   Yes [provider]  Cholecalciferol (VITAMIN D-3 PO) Take 1 capsule by mouth daily.   Yes [provider]  ELDERBERRY PO Take 1 each by mouth daily. Elderberry gummy supplement   Yes [provider]  hydrALAZINE  (APRESOLINE ) 25 MG tablet Take 2 tablets (50 mg total) by mouth 3 (three) times daily. 09/15/22  Yes Gonfa, Taye T, MD  ipratropium (ATROVENT ) 0.06 % nasal spray Place 1 spray into both nostrils 3 (three) times daily.   Yes [provider]  lisinopril  (ZESTRIL ) 40 MG tablet Take 1 tablet (40 mg total) by mouth daily. 09/15/22  Yes Gonfa, Taye T, MD  loratadine  (CLARITIN ) 10 MG tablet Take 10 mg by mouth daily.   Yes [provider]  meclizine  (ANTIVERT ) 25 MG tablet Take 25 mg by mouth 3 (three) times daily as needed for dizziness.   Yes [provider]  Multiple Vitamins-Minerals (MULTIVITAMIN MEN 50+) TABS Take 1 tablet by mouth daily.   Yes [provider]  ondansetron  (ZOFRAN ) 4 MG tablet Take 4 mg by mouth every 8 (eight) hours as needed for nausea or vomiting.   Yes [provider]  pantoprazole  (PROTONIX ) 40 MG tablet Take 1 tablet (40 mg total) by mouth 2 (two) times daily. 11/14/22 07/25/23 Yes [name], MD  predniSONE  (DELTASONE ) 5 MG tablet Take 1 tablet (5 mg total) by mouth daily  with breakfast. 04/25/23  Yes Patel, Donika K, DO  pyridostigmine  (MESTINON ) 60 MG tablet TAKE 1 TABLET BY MOUTH 3 TIMES DAILY. 05/16/23  Yes Patel, Donika K, DO  Efgartigimod alfa -Hyalur-qvfc (VYVGART  HYTRULO Hersey) Inject 1 Dose into the skin. Patient not taking: Reported on 07/25/2023    [provider]    Scheduled Meds:  [START ON 07/27/2023] aspirin  EC  81 mg Oral Q M,W,F   pantoprazole   40 mg Oral BID   [START ON 07/26/2023] predniSONE   5 mg Oral Q breakfast   pyridostigmine   60 mg Oral TID   Continuous Infusions:  diltiazem (CARDIZEM) infusion 5 mg/hr (07/25/23 1618)   heparin 1,200 Units/hr (07/25/23 1610)   PRN Meds: acetaminophen  **OR** acetaminophen , bisacodyl , guaiFENesin, hydrALAZINE , HYDROmorphone  (DILAUDID )  injection, ipratropium-albuterol , meclizine , nicotine, ondansetron  **OR** ondansetron  (ZOFRAN ) IV, oxyCODONE , senna-docusate, sodium phosphate , traZODone  Allergies:    Allergies  Allergen Reactions   No Known Allergies Other (See Comments)    Pt has no known allergies, but has Hx of Myasthenia Gravis    Social History:   Social History   Socioeconomic History   Marital status: Widowed    Spouse name: 3   Number of children: Not on file   Years of education: Not on file   Highest education level: Not on file  Occupational History   Occupation: retired  Tobacco Use   Smoking status: Never   Smokeless tobacco: Never  Vaping Use   Vaping status: Never Used  Substance and Sexual Activity   Alcohol use: No   Drug use: No   Sexual activity: Not on file  Other Topics Concern   Not on file  Social History Narrative   Are you right handed or left handed? Left Handed    Are you currently employed ?    What is your current occupation?   Do you live at home alone? yes   Who lives with you? Grand daughter lives next door and daughter stays with patient on Monday-Friday all day long.    What type of home do you live in: 1 story or 2 story? Lives in           Lung Cancer in 2019       Social Drivers of Health   Financial Resource Strain: Not on file  Food Insecurity: No Food Insecurity (09/13/2022)   Hunger Vital Sign    Worried About Running Out of Food in the Last Year: Never true    Ran Out of Food in the Last Year: Never true  Transportation Needs: No Transportation Needs (09/13/2022)   PRAPARE - Administrator, Civil Service (Medical): No    Lack of Transportation (Non-Medical): No  Physical Activity: Not on file  Stress: Not on file  Social Connections: Not on file  Intimate Partner Violence: Not At Risk (09/13/2022)   Humiliation, Afraid, Rape, and Kick questionnaire    Fear of Current or Ex-Partner: No    Emotionally Abused: No    Physically Abused: No    Sexually Abused: No    Family History:    Family History  Problem Relation Age of Onset   Heart attack Mother    Hypertension Mother    Heart attack Father    Hypertension Father    Colon cancer Neg Hx    Esophageal cancer Neg Hx    Liver disease Neg Hx      ROS:  Please see the history of present illness.   All other ROS reviewed and negative.     Physical Exam/Data: Vitals:   07/25/23 1412 07/25/23 1413 07/25/23 1415 07/25/23 1630  BP: 101/68 100/73 110/88 100/64  Pulse: (!) 57 (!) 48  (!) 114  Resp: (!) 21 20  (!) 23  Temp:      TempSrc:      SpO2: 98% 98%  98%  Weight:      Height:       No intake or output data in the 24 hours ending 07/25/23 1727    07/25/2023    1:00 PM 07/25/2023   10:01 AM 07/17/2023    8:45 AM  Last 3 Weights  Weight (lbs) 184 lb 184 lb 180 lb 6.4 oz  Weight (kg) 83.462 kg 83.462 kg 81.829 kg  Body mass index is 24.28 kg/m.  General:  Well nourished, well developed, in no acute distress HEENT: normal Neck: no JVD Vascular: No carotid bruits; Distal pulses 2+ bilaterally Cardiac:  irregular rhythm, tachycardic rate Lungs:  clear to auscultation bilaterally, no wheezing, rhonchi or rales  Abd: soft,  nontender, no hepatomegaly  Ext: mild pitting edema in LE Musculoskeletal:  No deformities, BUE and BLE strength normal and equal Skin: warm and dry  Neuro:  CNs 2-12 intact, no focal abnormalities noted Psych:  Normal affect   EKG:  The EKG was personally reviewed and demonstrates:  Afib with VR 156 Telemetry:  Telemetry was personally reviewed and demonstrates:  Afib with rates now in the 100-120 range  Relevant CV Studies:  Echo pending  Laboratory Data: High Sensitivity Troponin:  No results for input(s): TROPONINIHS in the last 720 hours.   Chemistry Recent Labs  Lab 07/25/23 1305  NA 140  K 4.3  CL 105  CO2 22  GLUCOSE 143*  BUN 18  CREATININE 1.31*  CALCIUM 9.8  GFRNONAA 52*  ANIONGAP 13    No results for input(s): PROT, ALBUMIN, AST, ALT, ALKPHOS, BILITOT in the last 168 hours. Lipids No results for input(s): CHOL, TRIG, HDL, LABVLDL, LDLCALC, CHOLHDL in the last 168 hours.  Hematology Recent Labs  Lab 07/25/23 1305  WBC 7.6  RBC 4.34  HGB 11.7*  HCT 37.2*  MCV 85.7  MCH 27.0  MCHC 31.5  RDW 14.0  PLT 177   Thyroid  Recent Labs  Lab 07/25/23 1330  TSH 1.483    BNP Recent Labs  Lab 07/25/23 1330  BNP 235.2*    DDimer No results for input(s): DDIMER in the last 168 hours.  Radiology/Studies:  DG Chest Portable 1 View Result Date: 07/25/2023 CLINICAL DATA:  Cough, shortness of breath. EXAM: PORTABLE CHEST 1 VIEW COMPARISON:  October 24, 2020. FINDINGS: Stable cardiomegaly. Small left pleural effusion is noted with left basilar atelectasis. Right lung is clear. Bony thorax is unremarkable. IMPRESSION: Small left pleural effusion with left basilar subsegmental atelectasis. Electronically Signed   By: Joseph Landy Raddle M.D.   On: 07/25/2023 14:07     Assessment and Plan:  Afib with RVR - new diagnosis this admission - given IV cardizem and now on gtt running at 5 mg/hr - unfortunately, cardizem may worsen MG and BP  is trending down - esophogram last year could not rule out distal narrowing, we will attempt to avoid TEE - heparin gtt running - will D/C cardizem and start amiodarone with bolus - if he does not convert but is rate controlled, we can anticoagulate for 3 weeks and then plan for OP DCCV without TEE   Chronic anticoagulation - head imaging last year with possible old stroke - he has not fallen recently, would be an anticoagulation candidate - he is agreeable to anticoagulation   Myasthenia gravis - presented with SOB - likely due to Afib with RVR - maintained on mestinon , prednisone , and vyvgart  - follows with neurology - will discontinue cardizem as above   Hypertension - with diagnosis of MG, stopped toprol   - now on hydralazine  25 mg TID and 40 mg lisinopril  - both held for rate controlling medications - he did take morning medication today    Risk Assessment/Risk Scores:    CHA2DS2-VASc Score = 3   This indicates a 3.2% annual risk of stroke. The patient's score is based upon: CHF History: 0 HTN History: 1 Diabetes History: 0 Stroke History:  0 Vascular Disease History: 0 Age Score: 2 Gender Score: 0      For questions or updates, please contact [facility] Please consult [email or web address] for contact info under    Signed, Jon Nat Hails, [place]  07/25/2023 5:27 PM  History and all data above reviewed.  Patient examined.  I agree with the findings as above.  The patient was noted to have atrial fibrillation while at a neurology appointment.  He has not felt this.  He has chronic shortness of breath since his diagnosis of myasthenia gravis.  He has not felt any different with this.  He lives alone.  His daughter checks on him frequently.  He has not had any new shortness of breath, PND or orthopnea.  He has not noticed any palpitations, presyncope or syncope.  He has had no chest pressure, neck or arm discomfort.  He has had no weight gain or edema.  He does  light household stuff.  In the emergency room he is noted to have fibrillation with a rapid rate.  He was put on Cardizem but this is contraindicated with myasthenia.  The patient exam reveals COR: Irregular, no murmurs,  Lungs: Clear to auscultation bilaterally,  Abd: Positive bowel sounds along frequency and pitch, Ext mild ankle edema.  All available labs, radiology testing, previous records reviewed. Agree with documented assessment and plan. Atrial fibrillation:  The patient does not know he is in fibrillation since unclear how long has been in this.  There is no contraindication anticoagulation and can be heparinized and switch to Eliquis.  There is a contraindication for calcium channel blockers and relative contraindication to beta-blockers and I will discuss this further with neurology.  Tonight when he was started amiodarone for rate control.  I would not suggest cardioversion since we do not know how long he has been in this rhythm.  Also we did research and he has some question of esophageal stricture so I would not suggest TEE.  We could cardiovert if he remains in fibrillation after 3 weeks of anticoagulation.  This Joseph [name]  6:37 PM  07/25/2023

## 2023-07-25 NOTE — ED Provider Notes (Signed)
 [place] EMERGENCY DEPARTMENT AT [facility] Provider Note   CSN: [id number] Arrival date & time: 07/25/23  1236     Patient presents with: Atrial Fibrillation and Shortness of Breath   Joseph [name] is a 89 y.o. male.   This is a 72-year-old male here today for shortness of breath.  Patient has a history of myasthenia   Atrial Fibrillation Associated symptoms include shortness of breath.  Shortness of Breath      Prior to Admission medications   Medication Sig Start Date End Date Taking? Authorizing Provider  acetaminophen  (TYLENOL ) 500 MG tablet Take 500-1,000 mg by mouth every 8 (eight) hours as needed for moderate pain or headache.    [provider]  amoxicillin (AMOXIL) 500 MG capsule Take 2,000 mg by mouth See admin instructions. Take 4 capsules (2000 mg) by mouth 1 hours prior to procedure.    [provider]  Ascorbic Acid (VITAMIN C PO) Take 1 packet by mouth daily.    [provider]  aspirin  EC 81 MG tablet Take 81 mg by mouth as directed. MWF    [provider]  Calcium Carbonate (CALCIUM 600 PO) Take by mouth.    [provider]  hydrALAZINE  (APRESOLINE ) 25 MG tablet Take 2 tablets (50 mg total) by mouth 3 (three) times daily. 09/15/22   Gonfa, Taye T, MD  ipratropium (ATROVENT ) 0.06 % nasal spray Place 1 spray into both nostrils 3 (three) times daily as needed (allergies).    [provider]  lisinopril  (ZESTRIL ) 40 MG tablet Take 1 tablet (40 mg total) by mouth daily. 09/15/22   Gonfa, Taye T, MD  loratadine  (CLARITIN ) 10 MG tablet Take 10 mg by mouth daily.    [provider]  meclizine  (ANTIVERT ) 25 MG tablet Take 25 mg by mouth 3 (three) times daily as needed for dizziness.    [provider]  Misc Natural Products (ELDERBERRY IMMUNE COMPLEX) CHEW Chew 1 tablet by mouth daily.    [provider]  Multiple Vitamins-Minerals (MULTIVITAMIN ADULTS) TABS Take 1 tablet by  mouth daily.    [provider]  ondansetron  (ZOFRAN ) 4 MG tablet Take 4 mg by mouth every 8 (eight) hours as needed for nausea or vomiting.    [provider]  pantoprazole  (PROTONIX ) 40 MG tablet Take 1 tablet (40 mg total) by mouth 2 (two) times daily. 11/14/22 07/25/23  [name], MD  predniSONE  (DELTASONE ) 5 MG tablet Take 1 tablet (5 mg total) by mouth daily with breakfast. 04/25/23   Patel, Donika K, DO  pyridostigmine  (MESTINON ) 60 MG tablet TAKE 1 TABLET BY MOUTH 3 TIMES DAILY. 05/16/23   Patel, Donika K, DO  VITAMIN D PO Take by mouth. 600 mg daily    [provider]    Allergies: Botulinum toxins    Review of Systems  Respiratory:  Positive for shortness of breath.     Updated Vital Signs BP 110/88   Pulse (!) 48   Temp 98.5 F (36.9 C) (Oral)   Resp 20   Ht 6' 1 (1.854 m)   Wt 83.5 kg   SpO2 98%   BMI 24.28 kg/m   Physical Exam Pulmonary:     Effort: Pulmonary effort is normal.     Breath sounds: No decreased breath sounds.     Comments: Patient able to take full breaths    (all labs ordered are listed, but only abnormal results are displayed) Labs Reviewed  CBC WITH  DIFFERENTIAL/PLATELET - Abnormal; Notable for the following components:      Result Value   Hemoglobin 11.7 (*)    HCT 37.2 (*)    Lymphs Abs 0.5 (*)    All other components within normal limits  BRAIN NATRIURETIC PEPTIDE - Abnormal; Notable for the following components:   B Natriuretic Peptide 235.2 (*)    All other components within normal limits  TSH  BASIC METABOLIC PANEL WITH GFR    EKG: EKG Interpretation Date/Time:  Wednesday July 25 2023 12:50:33 EDT Ventricular Rate:  156 PR Interval:    QRS Duration:  93 QT Interval:  309 QTC Calculation: 498 R Axis:   -40  Text Interpretation: Atrial fibrillation with rapid V-rate Ventricular premature complex Left axis deviation Low voltage, precordial leads Abnormal R-wave progression, late transition  Confirmed by Mannie Pac [id number]) on 07/25/2023 2:25:51 PM  Radiology: DG Chest Portable 1 View Result Date: 07/25/2023 CLINICAL DATA:  Cough, shortness of breath. EXAM: PORTABLE CHEST 1 VIEW COMPARISON:  October 24, 2020. FINDINGS: Stable cardiomegaly. Small left pleural effusion is noted with left basilar atelectasis. Right lung is clear. Bony thorax is unremarkable. IMPRESSION: Small left pleural effusion with left basilar subsegmental atelectasis. Electronically Signed   By: Lynwood Landy Raddle M.D.   On: 07/25/2023 14:07     .Critical Care  Performed by: Mannie Pac DASEN, DO Authorized by: Mannie Pac DASEN, DO   Critical care provider statement:    Critical care time (minutes):  35   Critical care was necessary to treat or prevent imminent or life-threatening deterioration of the following conditions:  Cardiac failure    Medications Ordered in the ED  pyridostigmine  (MESTINON ) tablet 60 mg (has no administration in time range)  diltiazem (CARDIZEM) injection 20 mg (20 mg Intravenous Given 07/25/23 1321)  diltiazem (CARDIZEM) injection 10 mg (10 mg Intravenous Given 07/25/23 1411)                                    Medical Decision Making 89 year old male here today for dyspnea on exertion.  Plan-patient noted to be in atrial fibrillation with RVR.  I believe this is a source of his dyspnea, and it is not related to his history of myasthenia gravis.  Patient is able to take full and deep breaths while sitting, speaks in complete sentences.  Patient without any clinical findings consistent with myasthenia crisis.  Did a bedside echocardiogram on the patient which showed not a significantly diminished EF which is fortunate given his myasthenia history.  Give the patient 20 of diltiazem followed by 10 with improvement in his rates.  I have ordered some p.o. diltiazem for the patient now.  Patient still having some increased rates, has a small pleural effusion on the right.  With his  significant history, I do believe he would benefit from further stabilization with hospitalization, consultation with cardiology before discharge.  Amount and/or Complexity of Data Reviewed Labs: ordered. Radiology: ordered.  Risk Prescription drug management.        Final diagnoses:  Atrial fibrillation with RVR [facility])    ED Discharge Orders     None          Mannie Pac DASEN, DO 07/25/23 1449

## 2023-07-25 NOTE — Progress Notes (Signed)
 Pt arrived to 6e01 from the ED.

## 2023-07-25 NOTE — H&P (Signed)
 History and Physical    [name]:[id number] DOB: 06-17-1931 DOA: 07/25/2023  PCP: Hugh Charleston, MD (Inactive) Patient coming from: PCP office  Chief Complaint: Tachycardia  HPI: Joseph [name] is a 89 y.o. male with medical history significant of GERD, osteoarthritis, diet-controlled DM2, HTN, history of prostate cancer, lung cancer status post lobectomy, myasthenia gravis on Vyvgart /Mestinon /prednisone  sent to the hospital for tachycardia.  Patient has been having fatigue and exertion for the past 2-3 days therefore went to go see his neurologist today.  Patient was noted to be atrial fibrillation with RVR therefore sent to PCP office where patient confirmed was in A-fib therefore sent to the hospital.  Patient denied any chest pain, lower extremity swelling and other complaints.  Denies any prior history of atrial fibrillation.  In the ER initially heart rate was in 150s responded to IV push Cardizem.  Medical team was requested to admit this patient.  When I saw this patient his heart rate was irregular and in 130s.  Family was present at bedside.   Review of Systems: As per HPI otherwise 10 point review of systems negative.  Review of Systems Otherwise negative except as per HPI, including: General: Denies fever, chills, night sweats or unintended weight loss. Resp: Denies cough, wheezing, shortness of breath. Cardiac: Denies chest pain, palpitations, orthopnea, paroxysmal nocturnal dyspnea. GI: Denies abdominal pain, nausea, vomiting, diarrhea or constipation GU: Denies dysuria, frequency, hesitancy or incontinence MS: Denies muscle aches, joint pain or swelling Neuro: Denies headache, neurologic deficits (focal weakness, numbness, tingling), abnormal gait Psych: Denies anxiety, depression, SI/HI/AVH Skin: Denies new rashes or lesions ID: Denies sick contacts, exotic exposures, travel  Past Medical History:  Diagnosis Date   Arthritis    Cancer (HCC)    HX  PROSTATE CANCER - MANY YRS AGO - ? RADIATION TX. Lung cancer   Diabetes mellitus without complication (HCC)    DIET CONTROLLED   GERD (gastroesophageal reflux disease)    Graves disease    Hearing loss    History of hiatal hernia    History of kidney stones    History of nonunion of fracture    LEFT PROXIMAL FEMUR   Hypertension    Pneumonia    Seasonal allergies     Past Surgical History:  Procedure Laterality Date   CONVERSION TO TOTAL HIP Left 03/19/2014   Procedure: CONVERSION OF PREVIOUS HIP SURGERY TO LEFT TOTAL HIP ARTHROPLASTY;  Surgeon: Dempsey Melodi GAILS, MD;  Location: [facility];  Service: Orthopedics;  Laterality: Left;   CYSTOSCOPY WITH RETROGRADE PYELOGRAM, URETEROSCOPY AND STENT PLACEMENT Right 10/24/2020   Procedure: CYSTOSCOPY WITH RETROGRADE PYELOGRAM, URETEROSCOPY AND STENT PLACEMENT;  Surgeon: Cam Morene ORN, MD;  Location: [facility];  Service: Urology;  Laterality: Right;   FEMUR IM NAIL  01/18/2012   Procedure: INTRAMEDULLARY (IM) NAIL FEMORAL;  Surgeon: Dempsey GAILS Melodi, MD;  Location: [facility];  Service: Orthopedics;  Laterality: Left;   HARDWARE REMOVAL Left 01/13/2013   Procedure: HARDWARE REVISION LEFT HIP AND EXCHANGE OF COMPRESSION SCREW;  Surgeon: Dempsey GAILS Melodi, MD;  Location: [facility];  Service: Orthopedics;  Laterality: Left;   HELLER MYOTOMY     HIATAL HERNIA REPAIR  2011   JOINT REPLACEMENT  12/2011   BIL KNEE REPLACEMENTS   LOBECTOMY Left    left upper lobe   THORACOSCOPY     VIDEO BRONCHOSCOPY WITH ENDOBRONCHIAL NAVIGATION N/A 03/21/2017   Procedure: VIDEO BRONCHOSCOPY WITH ENDOBRONCHIAL NAVIGATION;  Surgeon: Kerrin Elspeth BROCKS, MD;  Location: [facility]  OR;  Service: Thoracic;  Laterality: N/A;    SOCIAL HISTORY:  reports that he has never smoked. He has never used smokeless tobacco. He reports that he does not drink alcohol and does not use drugs.  No Known Allergies   FAMILY HISTORY: Family History  Problem Relation Age of Onset   Heart attack  Mother    Hypertension Mother    Heart attack Father    Hypertension Father    Colon cancer Neg Hx    Esophageal cancer Neg Hx    Liver disease Neg Hx      Prior to Admission medications   Medication Sig Start Date End Date Taking? Authorizing Provider  acetaminophen  (TYLENOL ) 500 MG tablet Take 500-1,000 mg by mouth every 8 (eight) hours as needed for moderate pain or headache.   Yes [provider]  amoxicillin (AMOXIL) 500 MG capsule Take 2,000 mg by mouth See admin instructions. Take 4 capsules (2000 mg) by mouth 1 hours prior to procedure.   Yes [provider]  Ascorbic Acid (VITAMIN C) POWD Take 1 packet by mouth daily.   Yes [provider]  aspirin  EC 81 MG tablet Take 81 mg by mouth every Monday, Wednesday, and Friday.   Yes [provider]  hydrALAZINE  (APRESOLINE ) 25 MG tablet Take 2 tablets (50 mg total) by mouth 3 (three) times daily. 09/15/22  Yes Gonfa, Taye T, MD  pantoprazole  (PROTONIX ) 40 MG tablet Take 1 tablet (40 mg total) by mouth 2 (two) times daily. 11/14/22 07/25/23 Yes [name], MD  predniSONE  (DELTASONE ) 5 MG tablet Take 1 tablet (5 mg total) by mouth daily with breakfast. 04/25/23  Yes Patel, Donika K, DO  pyridostigmine  (MESTINON ) 60 MG tablet TAKE 1 TABLET BY MOUTH 3 TIMES DAILY. 05/16/23  Yes Patel, Donika K, DO  Cholecalciferol (VITAMIN D-3 PO) Take 1 capsule by mouth daily.    [provider]  ELDERBERRY PO Take 1 each by mouth daily. Elderberry gummy supplement    [provider]  ipratropium (ATROVENT ) 0.06 % nasal spray Place 1 spray into both nostrils 3 (three) times daily as needed (allergies).    [provider]  lisinopril  (ZESTRIL ) 40 MG tablet Take 1 tablet (40 mg total) by mouth daily. 09/15/22   Gonfa, Taye T, MD  loratadine  (CLARITIN ) 10 MG tablet Take 10 mg by mouth daily.    [provider]  meclizine  (ANTIVERT ) 25 MG tablet Take 25 mg by mouth 3 (three) times daily as  needed for dizziness.    [provider]  Multiple Vitamins-Minerals (MULTIVITAMIN MEN 50+) TABS Take 1 tablet by mouth daily.    [provider]  ondansetron  (ZOFRAN ) 4 MG tablet Take 4 mg by mouth every 8 (eight) hours as needed for nausea or vomiting.    [provider]    Physical Exam: Vitals:   07/25/23 1330 07/25/23 1412 07/25/23 1413 07/25/23 1415  BP: 106/86 101/68 100/73 110/88  Pulse: (!) 142 (!) 57 (!) 48   Resp: (!) 26 (!) 21 20   Temp:      TempSrc:      SpO2: 100% 98% 98%   Weight:      Height:          Constitutional: NAD, calm, comfortable Eyes: PERRL, lids and conjunctivae normal ENMT: Mucous membranes are moist. Posterior pharynx clear of any exudate or lesions.Normal dentition.  Neck: normal, supple, no masses, no thyromegaly Respiratory: clear to auscultation bilaterally, no wheezing, no crackles.  Normal respiratory effort. No accessory muscle use.  Cardiovascular: Tachycardia, irregularly irregular rhythm Abdomen: no tenderness, no masses palpated. No hepatosplenomegaly. Bowel sounds positive.  Musculoskeletal: no clubbing / cyanosis. No joint deformity upper and lower extremities. Good ROM, no contractures. Normal muscle tone.  Skin: no rashes, lesions, ulcers. No induration Neurologic: CN 2-12 grossly intact. Sensation intact, DTR normal. Strength 5/5 in all 4.  Psychiatric: Normal judgment and insight. Alert and oriented x 3. Normal mood.    Body mass index is 24.28 kg/m.      Labs on Admission: I have personally reviewed following labs and imaging studies  CBC: Recent Labs  Lab 07/25/23 1305  WBC 7.6  NEUTROABS 6.4  HGB 11.7*  HCT 37.2*  MCV 85.7  PLT 177   Basic Metabolic Panel: Recent Labs  Lab 07/25/23 1305  NA 140  K 4.3  CL 105  CO2 22  GLUCOSE 143*  BUN 18  CREATININE 1.31*  CALCIUM 9.8   GFR: Estimated Creatinine Clearance: 42.4 mL/min (A) (by C-G formula based on SCr of 1.31 mg/dL  (H)). Liver Function Tests: No results for input(s): AST, ALT, ALKPHOS, BILITOT, PROT, ALBUMIN in the last 168 hours. No results for input(s): LIPASE, AMYLASE in the last 168 hours. No results for input(s): AMMONIA in the last 168 hours. Coagulation Profile: No results for input(s): INR, PROTIME in the last 168 hours. Cardiac Enzymes: No results for input(s): CKTOTAL, CKMB, CKMBINDEX, TROPONINI in the last 168 hours. BNP (last 3 results) No results for input(s): PROBNP in the last 8760 hours. HbA1C: No results for input(s): HGBA1C in the last 72 hours. CBG: No results for input(s): GLUCAP in the last 168 hours. Lipid Profile: No results for input(s): CHOL, HDL, LDLCALC, TRIG, CHOLHDL, LDLDIRECT in the last 72 hours. Thyroid Function Tests: Recent Labs    07/25/23 1330  TSH 1.483   Anemia Panel: No results for input(s): VITAMINB12, FOLATE, FERRITIN, TIBC, IRON, RETICCTPCT in the last 72 hours. Urine analysis:    Component Value Date/Time   COLORURINE YELLOW 10/24/2020 1010   APPEARANCEUR CLEAR 10/24/2020 1010   LABSPEC 1.014 10/24/2020 1010   PHURINE 5.5 10/24/2020 1010   GLUCOSEU NEGATIVE 10/24/2020 1010   HGBUR MODERATE (A) 10/24/2020 1010   BILIRUBINUR NEGATIVE 10/24/2020 1010   KETONESUR 15 (A) 10/24/2020 1010   PROTEINUR 30 (A) 10/24/2020 1010   UROBILINOGEN 1.0 11/16/2014 0326   NITRITE NEGATIVE 10/24/2020 1010   LEUKOCYTESUR LARGE (A) 10/24/2020 1010   Sepsis Labs: !!!!!!!!!!!!!!!!!!!!!!!!!!!!!!!!!!!!!!!!!!!! @LABRCNTIP (procalcitonin:4,lacticidven:4) )No results found for this or any previous visit (from the past 240 hours).   Radiological Exams on Admission: DG Chest Portable 1 View Result Date: 07/25/2023 CLINICAL DATA:  Cough, shortness of breath. EXAM: PORTABLE CHEST 1 VIEW COMPARISON:  October 24, 2020. FINDINGS: Stable cardiomegaly. Small left pleural effusion is noted with left basilar  atelectasis. Right lung is clear. Bony thorax is unremarkable. IMPRESSION: Small left pleural effusion with left basilar subsegmental atelectasis. Electronically Signed   By: Lynwood Landy Raddle M.D.   On: 07/25/2023 14:07    Nutritional status  All images have been reviewed by me personally.  EKG: Independently reviewed.  Irregularly irregular with heart rate in 130s  Assessment/Plan Principal Problem:   Atrial fibrillation with RVR (HCC) Active Problems:   Diet-controlled type 2 diabetes mellitus (HCC)   Myasthenia gravis without exacerbation (HCC)   Atrial fibrillation with RVR - New onset.  TSH is normal.  Slightly responded to initial IV Cardizem push, will start patient on Cardizem  and heparin drip.  Cardiology has been consulted.  Echocardiogram ordered.  Monitor and replete electrolytes as necessary.  Myasthenia gravis without exacerbation - Follows outpatient neurology. On Vyvgart , Mestinon  and prednisone .  Will continue prednisone  and Mestinon  in the hospital.  Essential hypertension - IV as needed.  Awaiting pharmacy tech to finalize medication  Diet controlled diabetes mellitus type 2 - Supportive care  History of prostate cancer History of lung cancer status post resection - In remission   Pending med rec  DVT prophylaxis: Heparin drip Code Status: Full code, confirmed by family Family Communication: Family present at bedside Consults called: EDP consulted cardiology Admission status: Telemetry admission  Status is: Observation The patient remains OBS appropriate and will d/c before 2 midnights.   Time Spent: 65 minutes.  >50% of the time was devoted to discussing the patients care, assessment, plan and disposition with other care givers along with counseling the patient about the risks and benefits of treatment.    Burgess JAYSON Dare MD [facility]  If 7PM-7AM, please contact night-coverage   07/25/2023, 4:01 PM

## 2023-07-25 NOTE — ED Notes (Signed)
 Pt normally takes Mestinon  and Hydralazine  three times a day.    EDP states to hold medication at this time.

## 2023-07-25 NOTE — Progress Notes (Signed)
 Follow-up Visit   Date: 07/25/2023    Joseph [name] MRN: [id number] DOB: 02/27/1931    Joseph [name] is a 89 y.o. left-handed Caucasian male with diet controlled diabetes mellitus, GERD, hypertension, history of prostate cancer, lung cancer s/p left upper lobe resection (2019) returning to the clinic for follow-up of seropositive myasthenia gravis.  The patient was accompanied to the clinic by daughter who also provides collateral information.    IMPRESSION/PLAN: Seropositive generalized myasthenia gravis without exacerbation, thymoma negative (08/2022).  There is no evidence of disease manifestation on his exam. The highest dose of prednisone  is 10mg .  Vyvgart  Hytrulo started in September 2024 and he has responded very well.   - Reschedule next Vyvgart  Hytrulo in 9 weeks from last infusion (6/17), ~ week of August 18.  - Continue mestinon  60mg  three times daily.   - Continue prednisone  5mg  daily, plan to taper in the fall  2.   Tachycardia with irregular heart rhythm.  Recommend that he see PCP or urgent care for 12-lead EKG to further evaluate this ?atrial fibrillation.   Return to clinic in 3 months  --------------------------------------------- History of present illness: Early August 2024, he began having double vision and left eye ptosis.  Daughter went to the ER due to concern of stroke where CT head was negative.  He saw Dr. Octavia the following day who felt that symptoms were due to myasthenia gravis so urged him to go to the ER.  He went to [name] where MRI brain did not show stroke.  There was high clinical suspicion for myasthenia so he was started on mestinon  30mg  three times daily which did help his difficulty with swallowing and shortness of breath.  Unfortunately, there has been no improvement with droopiness of the eyelids.  Eyelids are slightly better for about 15 minutes in the morning, but then quickly start to feel heavy again. He does not notice double  vision much because eyes stay closed.  He is very frustrated by not being able to see or keep his eyes open.    UPDATE 10/25/2022:  He is here for follow-up visit.  He has been on prednisone  10mg  daily x 2 weeks, and received his second infusion of Vyvgart  Hytrulo yesterday.  Over the past 3-4 days, he noticed that his right eyelid was opening up and staying open for longer periods of time.  When tired and in the evening, it continues to get droopy.  His speech and swallow is normal.  He has some weakness in the legs and uses a walker. Overall, he is feeling much better.  No new complaints.  He has been monitor blood sugar at home which is staying 140-180s.    UPDATE 11/29/2022:  He is here for for follow-up visit.  He reports doing great and denies any ptosis, facial weakness, slurred, or difficulty with swallowing.  He is back to doing his usual activities and mood is much better.  He is scheduled for Vyvgart  Hytrullo next month.   UPDATE 02/21/2023:  He is here for follow-up visit.  He has been doing well on prednisone  7mg  daily. He denies double vision, droopiness of the eyelids, speech/swallowing difficulty, or weakness.   He completed Vyvgart  Hytrulo with last infusion on 12/10.  He was billed $3200 for his infusions last year and was told it would cost him $3400 with the first injection.  He was having quivering' of the left leg and had steroid injection to the left hip after  which his symptoms have signficantly improved.    UPDATE 04/25/2023:  He started having difficulty with swallowing and started droopy again around late February.  He was approved for Vyvgart  Hytrulo and within one week of his first infusion, he started having improvement.  He denies any current double vision, droopiness of the eyelids, or limb weakness. He remains on prednisone  5mg  daily.   UPDATE 07/25/2023:  He is here for follow-up visit.  He completed Vyvgart  Hytrulo on 6/17 which was 8 weeks from prior cycle and has been  doing great.  He did not have any MG symptoms prior to his infusion.  He is doing well and denies droopiness, problems with speech/swallow, or weakness.  He is bothered by excessive tearing from his eyes, which has been present the past few months.  No new complaints.   Of note, his HR is elevated today and irregular.  He denies chest pain, palpitation, or shortness of breath.   Medications:  Current Outpatient Medications on File Prior to Visit  Medication Sig Dispense Refill   acetaminophen  (TYLENOL ) 500 MG tablet Take 500-1,000 mg by mouth every 8 (eight) hours as needed for moderate pain or headache.     amoxicillin (AMOXIL) 500 MG capsule Take 2,000 mg by mouth See admin instructions. Take 4 capsules (2000 mg) by mouth 1 hours prior to procedure.     Ascorbic Acid (VITAMIN C PO) Take 1 packet by mouth daily.     aspirin  EC 81 MG tablet Take 81 mg by mouth as directed. MWF     Calcium Carbonate (CALCIUM 600 PO) Take by mouth.     hydrALAZINE  (APRESOLINE ) 25 MG tablet Take 2 tablets (50 mg total) by mouth 3 (three) times daily. 180 tablet 1   ipratropium (ATROVENT ) 0.06 % nasal spray Place 1 spray into both nostrils 3 (three) times daily as needed (allergies).     lisinopril  (ZESTRIL ) 40 MG tablet Take 1 tablet (40 mg total) by mouth daily. 90 tablet 1   loratadine  (CLARITIN ) 10 MG tablet Take 10 mg by mouth daily.     meclizine  (ANTIVERT ) 25 MG tablet Take 25 mg by mouth 3 (three) times daily as needed for dizziness.     Misc Natural Products (ELDERBERRY IMMUNE COMPLEX) CHEW Chew 1 tablet by mouth daily.     Multiple Vitamins-Minerals (MULTIVITAMIN ADULTS) TABS Take 1 tablet by mouth daily.     ondansetron  (ZOFRAN ) 4 MG tablet Take 4 mg by mouth every 8 (eight) hours as needed for nausea or vomiting.     pantoprazole  (PROTONIX ) 40 MG tablet Take 1 tablet (40 mg total) by mouth 2 (two) times daily. 180 tablet 4   predniSONE  (DELTASONE ) 5 MG tablet Take 1 tablet (5 mg total) by mouth daily  with breakfast. 90 tablet 1   pyridostigmine  (MESTINON ) 60 MG tablet TAKE 1 TABLET BY MOUTH 3 TIMES DAILY. 270 tablet 1   VITAMIN D PO Take by mouth. 600 mg daily     No current facility-administered medications on file prior to visit.    Allergies: No Known Allergies  Vital Signs:  BP 122/62   Pulse (!) 146   Ht 6' 1 (1.854 m)   Wt 184 lb (83.5 kg)   SpO2 98%   BMI 24.28 kg/m   General:  well-appearing, no acute distress Heart:  Tachycardic rate, rhythm is irregular  Neurological Exam: MENTAL STATUS including orientation to time, place, person, recent and remote memory, attention span and concentration, language, and fund of  knowledge is normal.  Speech is not dysarthric.  CRANIAL NERVES:    Pupils equal round and reactive to light.  Normal conjugate, extra-ocular eye movements in all directions of gaze.  Minimal right ptosis at rest with no worsening with sustained upgaze. Face is symmetric. Orbicularis oculi 5/5, orbicularis oris 5/5, buccinator 5/5.  Palate elevates symmetrically.  Tongue is midline and strength is 5/5  MOTOR:  Motor strength is 5/5 in all extremities, including bilateral hip flexors.  No atrophy, fasciculations or abnormal movements.  No pronator drift.  Tone is normal.    COORDINATION/GAIT:  He is able to stand up without pushing off with arms.  Gait is mildly wide-based, unassisted.    Data: Labs 09/13/2022:  AChR antibody binding 3.73*   MRI brain wwo contrast 09/12/2022: 1. No acute intracranial pathology or other finding to explain the patient's symptoms. 2. 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 3. On initial injection of intravenous contrast, the patient's IV infiltrative and 7.5 cc con trast was injected. Per the technologist, there was mild swelling at the injection site but no significant pain or neurologic symptoms. Recommend monitoring of the IV site while the  patient is in the ED/inpatient, with instructions to return if progressive pain or neurologic symptoms develop.   CT chest wo contrast 09/15/2022:  No evidence of thymoma. Status post left upper lobectomy. No evidence of recurrent or metastatic disease. Moderate hiatal hernia with mildly irregular left lateral wall thickening, poorly evaluated. Consider endoscopy for further evaluation.   Aortic Atherosclerosis (ICD10-I70.0) and Emphysema (ICD10-J43.9).    Thank you for allowing me to participate in patient's care.  If I can answer any additional questions, I would be pleased to do so.    Sincerely,    Savyon Loken K. Tobie, DO

## 2023-07-25 NOTE — ED Triage Notes (Signed)
 Sent after seeing neurology and found to be in Afib RVR.  Reports getting winded with exertion but not symptomatic with this at this time.  Hx of lung CA

## 2023-07-25 NOTE — Progress Notes (Signed)
 PHARMACY - ANTICOAGULATION CONSULT NOTE  Pharmacy Consult for Heparin Indication: atrial fibrillation  Allergies  Allergen Reactions   Botulinum Toxins Other (See Comments)    Patient Measurements: Height: 6' 1 (185.4 cm) Weight: 83.5 kg (184 lb) IBW/kg (Calculated) : 79.9 HEPARIN DW (KG): 83.5  Vital Signs: Temp: 98.5 F (36.9 C) (06/25 1301) Temp Source: Oral (06/25 1301) BP: 110/88 (06/25 1415) Pulse Rate: 48 (06/25 1413)  Labs: Recent Labs    07/25/23 1305  HGB 11.7*  HCT 37.2*  PLT 177  CREATININE 1.31*    Estimated Creatinine Clearance: 42.4 mL/min (A) (by C-G formula based on SCr of 1.31 mg/dL (H)).   Medical History: Past Medical History:  Diagnosis Date   Arthritis    Cancer (HCC)    HX PROSTATE CANCER - MANY YRS AGO - ? RADIATION TX. Lung cancer   Diabetes mellitus without complication (HCC)    DIET CONTROLLED   GERD (gastroesophageal reflux disease)    Graves disease    Hearing loss    History of hiatal hernia    History of kidney stones    History of nonunion of fracture    LEFT PROXIMAL FEMUR   Hypertension    Pneumonia    Seasonal allergies     Assessment: 89 yom with a history of DM, GERD, HTN, hx of prostate cancer, lung cancer s/p lt upper lobe resection, myasthenia gravis presenting after being sent from neurology appointment where he was noted to be in AF. Apixaban per pharmacy consult initially placed for AF but now electing for heparin therapy initially  Hgb 11.7; plt 177  Apixaban cost per patient advocate team: $47 for 30-day supply  Goal of Therapy:  Monitor platelets by anticoagulation protocol: Yes   Plan:  Give 4000 units IV heparin now Start 1200 units/hr heparin infusion now Check HL at 0000 6/26 Daily HL and CBC while on heparin  Dorn Buttner, PharmD, BCPS 07/25/2023 3:48 PM ED Clinical Pharmacist -  [phone]

## 2023-07-25 NOTE — Progress Notes (Signed)
 PHARMACY - ANTICOAGULATION CONSULT NOTE  Pharmacy Consult for Apixaban Indication: atrial fibrillation  Allergies  Allergen Reactions   Botulinum Toxins Other (See Comments)    Patient Measurements: Height: 6' 1 (185.4 cm) Weight: 83.5 kg (184 lb) IBW/kg (Calculated) : 79.9 HEPARIN DW (KG): 83.5  Vital Signs: Temp: 98.5 F (36.9 C) (06/25 1301) Temp Source: Oral (06/25 1301) BP: 106/86 (06/25 1330) Pulse Rate: 142 (06/25 1330)  Labs: No results for input(s): HGB, HCT, PLT, APTT, LABPROT, INR, HEPARINUNFRC, HEPRLOWMOCWT, CREATININE, CKTOTAL, CKMB, TROPONINIHS in the last 72 hours.  CrCl cannot be calculated (Patient's most recent lab result is older than the maximum 21 days allowed.).   Medical History: Past Medical History:  Diagnosis Date   Arthritis    Cancer (HCC)    HX PROSTATE CANCER - MANY YRS AGO - ? RADIATION TX. Lung cancer   Diabetes mellitus without complication (HCC)    DIET CONTROLLED   GERD (gastroesophageal reflux disease)    Graves disease    Hearing loss    History of hiatal hernia    History of kidney stones    History of nonunion of fracture    LEFT PROXIMAL FEMUR   Hypertension    Pneumonia    Seasonal allergies     Assessment: 89 yom with a history of DM, GERD, HTN, hx of prostate cancer, lung cancer s/p lt upper lobe resection, myasthenia gravis presenting after being sent from neurology appointment where he was noted to be in AF. Apixaban per pharmacy consult placed for AF.  Hgb 11.7; plt 177  Dose reduction criteria (need 2 of 3): Age >/= 80?: Yes Weight </= 60kg?: No Scr >/= 1.5?: No  Cost per patient advocate team: $47 for 30-day supply  Goal of Therapy:  Monitor platelets by anticoagulation protocol: Yes   Plan:  Start apixaban 5 mg BID Monitor renal function and for s/s of hemorrhage Patient will need education  Dorn Buttner, PharmD, BCPS 07/25/2023 1:54 PM ED Clinical Pharmacist -   [phone]

## 2023-07-25 NOTE — Telephone Encounter (Signed)

## 2023-07-26 ENCOUNTER — Observation Stay (HOSPITAL_COMMUNITY)

## 2023-07-26 ENCOUNTER — Encounter: Payer: Self-pay | Admitting: Neurology

## 2023-07-26 ENCOUNTER — Other Ambulatory Visit: Payer: Self-pay | Admitting: Neurology

## 2023-07-26 DIAGNOSIS — M1990 Unspecified osteoarthritis, unspecified site: Secondary | ICD-10-CM | POA: Diagnosis present

## 2023-07-26 DIAGNOSIS — I429 Cardiomyopathy, unspecified: Secondary | ICD-10-CM | POA: Diagnosis present

## 2023-07-26 DIAGNOSIS — I77819 Aortic ectasia, unspecified site: Secondary | ICD-10-CM | POA: Diagnosis present

## 2023-07-26 DIAGNOSIS — I5022 Chronic systolic (congestive) heart failure: Secondary | ICD-10-CM | POA: Diagnosis present

## 2023-07-26 DIAGNOSIS — I110 Hypertensive heart disease with heart failure: Secondary | ICD-10-CM | POA: Diagnosis not present

## 2023-07-26 DIAGNOSIS — I428 Other cardiomyopathies: Secondary | ICD-10-CM

## 2023-07-26 DIAGNOSIS — I10 Essential (primary) hypertension: Secondary | ICD-10-CM | POA: Diagnosis not present

## 2023-07-26 DIAGNOSIS — Z8673 Personal history of transient ischemic attack (TIA), and cerebral infarction without residual deficits: Secondary | ICD-10-CM | POA: Diagnosis not present

## 2023-07-26 DIAGNOSIS — I081 Rheumatic disorders of both mitral and tricuspid valves: Secondary | ICD-10-CM | POA: Diagnosis present

## 2023-07-26 DIAGNOSIS — K219 Gastro-esophageal reflux disease without esophagitis: Secondary | ICD-10-CM | POA: Diagnosis present

## 2023-07-26 DIAGNOSIS — I361 Nonrheumatic tricuspid (valve) insufficiency: Secondary | ICD-10-CM | POA: Diagnosis not present

## 2023-07-26 DIAGNOSIS — H9190 Unspecified hearing loss, unspecified ear: Secondary | ICD-10-CM | POA: Diagnosis present

## 2023-07-26 DIAGNOSIS — N1831 Chronic kidney disease, stage 3a: Secondary | ICD-10-CM | POA: Diagnosis present

## 2023-07-26 DIAGNOSIS — Z7901 Long term (current) use of anticoagulants: Secondary | ICD-10-CM | POA: Diagnosis not present

## 2023-07-26 DIAGNOSIS — K220 Achalasia of cardia: Secondary | ICD-10-CM | POA: Diagnosis present

## 2023-07-26 DIAGNOSIS — D631 Anemia in chronic kidney disease: Secondary | ICD-10-CM | POA: Diagnosis present

## 2023-07-26 DIAGNOSIS — I130 Hypertensive heart and chronic kidney disease with heart failure and stage 1 through stage 4 chronic kidney disease, or unspecified chronic kidney disease: Secondary | ICD-10-CM | POA: Diagnosis present

## 2023-07-26 DIAGNOSIS — Z8249 Family history of ischemic heart disease and other diseases of the circulatory system: Secondary | ICD-10-CM | POA: Diagnosis not present

## 2023-07-26 DIAGNOSIS — I5021 Acute systolic (congestive) heart failure: Secondary | ICD-10-CM | POA: Diagnosis not present

## 2023-07-26 DIAGNOSIS — Z79899 Other long term (current) drug therapy: Secondary | ICD-10-CM | POA: Diagnosis not present

## 2023-07-26 DIAGNOSIS — G7000 Myasthenia gravis without (acute) exacerbation: Secondary | ICD-10-CM | POA: Diagnosis present

## 2023-07-26 DIAGNOSIS — E1122 Type 2 diabetes mellitus with diabetic chronic kidney disease: Secondary | ICD-10-CM | POA: Diagnosis present

## 2023-07-26 DIAGNOSIS — Z902 Acquired absence of lung [part of]: Secondary | ICD-10-CM | POA: Diagnosis not present

## 2023-07-26 DIAGNOSIS — Z87442 Personal history of urinary calculi: Secondary | ICD-10-CM | POA: Diagnosis not present

## 2023-07-26 DIAGNOSIS — I4891 Unspecified atrial fibrillation: Secondary | ICD-10-CM | POA: Diagnosis present

## 2023-07-26 DIAGNOSIS — I509 Heart failure, unspecified: Secondary | ICD-10-CM | POA: Diagnosis not present

## 2023-07-26 DIAGNOSIS — Z888 Allergy status to other drugs, medicaments and biological substances status: Secondary | ICD-10-CM | POA: Diagnosis not present

## 2023-07-26 DIAGNOSIS — I3139 Other pericardial effusion (noninflammatory): Secondary | ICD-10-CM | POA: Diagnosis present

## 2023-07-26 DIAGNOSIS — R0602 Shortness of breath: Secondary | ICD-10-CM | POA: Diagnosis present

## 2023-07-26 DIAGNOSIS — I340 Nonrheumatic mitral (valve) insufficiency: Secondary | ICD-10-CM | POA: Diagnosis not present

## 2023-07-26 DIAGNOSIS — I351 Nonrheumatic aortic (valve) insufficiency: Secondary | ICD-10-CM | POA: Diagnosis not present

## 2023-07-26 DIAGNOSIS — C61 Malignant neoplasm of prostate: Secondary | ICD-10-CM | POA: Diagnosis present

## 2023-07-26 DIAGNOSIS — E119 Type 2 diabetes mellitus without complications: Secondary | ICD-10-CM | POA: Diagnosis not present

## 2023-07-26 DIAGNOSIS — Z7982 Long term (current) use of aspirin: Secondary | ICD-10-CM | POA: Diagnosis not present

## 2023-07-26 LAB — BASIC METABOLIC PANEL WITH GFR
Anion gap: 11 (ref 5–15)
BUN: 20 mg/dL (ref 8–23)
CO2: 23 mmol/L (ref 22–32)
Calcium: 8.6 mg/dL — ABNORMAL LOW (ref 8.9–10.3)
Chloride: 101 mmol/L (ref 98–111)
Creatinine, Ser: 1.41 mg/dL — ABNORMAL HIGH (ref 0.61–1.24)
GFR, Estimated: 47 mL/min — ABNORMAL LOW (ref >60)
Glucose, Bld: 308 mg/dL — ABNORMAL HIGH (ref 70–99)
Potassium: 4.4 mmol/L (ref 3.5–5.1)
Sodium: 135 mmol/L (ref 135–145)

## 2023-07-26 LAB — CBC
HCT: 33 % — ABNORMAL LOW (ref 39.0–52.0)
Hemoglobin: 10.2 g/dL — ABNORMAL LOW (ref 13.0–17.0)
MCH: 26.1 pg (ref 26.0–34.0)
MCHC: 30.9 g/dL (ref 30.0–36.0)
MCV: 84.4 fL (ref 80.0–100.0)
Platelets: 155 10*3/uL (ref 150–400)
RBC: 3.91 MIL/uL — ABNORMAL LOW (ref 4.22–5.81)
RDW: 14 % (ref 11.5–15.5)
WBC: 6.1 10*3/uL (ref 4.0–10.5)
nRBC: 0 % (ref 0.0–0.2)

## 2023-07-26 LAB — ECHOCARDIOGRAM COMPLETE
AR max vel: 2.45 cm2
AV Area VTI: 2.65 cm2
AV Area mean vel: 2.47 cm2
AV Mean grad: 3 mmHg
AV Peak grad: 4.4 mmHg
Ao pk vel: 1.05 m/s
Area-P 1/2: 4.77 cm2
Height: 73 in
S' Lateral: 4.2 cm
Weight: 2888 [oz_av]

## 2023-07-26 LAB — HEMOGLOBIN A1C
Hgb A1c MFr Bld: 6.9 % — ABNORMAL HIGH (ref 4.8–5.6)
Mean Plasma Glucose: 151.33 mg/dL

## 2023-07-26 LAB — HEPARIN LEVEL (UNFRACTIONATED)
Heparin Unfractionated: 0.45 [IU]/mL (ref 0.30–0.70)
Heparin Unfractionated: 0.47 [IU]/mL (ref 0.30–0.70)

## 2023-07-26 LAB — GLUCOSE, CAPILLARY
Glucose-Capillary: 172 mg/dL — ABNORMAL HIGH (ref 70–99)
Glucose-Capillary: 142 mg/dL — ABNORMAL HIGH (ref 70–99)

## 2023-07-26 LAB — MAGNESIUM: Magnesium: 2 mg/dL (ref 1.7–2.4)

## 2023-07-26 LAB — PHOSPHORUS: Phosphorus: 3.1 mg/dL (ref 2.5–4.6)

## 2023-07-26 MED ORDER — INSULIN ASPART 100 UNIT/ML IJ SOLN
0.0000 [IU] | Freq: Three times a day (TID) | INTRAMUSCULAR | Status: DC
  Administered 2023-07-26 – 2023-07-27 (×2): 2 [IU] via SUBCUTANEOUS
  Administered 2023-07-27 (×2): 1 [IU] via SUBCUTANEOUS
  Administered 2023-07-28: 2 [IU] via SUBCUTANEOUS
  Administered 2023-07-28: 1 [IU] via SUBCUTANEOUS
  Administered 2023-07-28 – 2023-07-29 (×2): 2 [IU] via SUBCUTANEOUS

## 2023-07-26 MED ORDER — SODIUM CHLORIDE 0.9 % IV SOLN: INTRAVENOUS | Status: DC

## 2023-07-26 MED ORDER — APIXABAN 5 MG PO TABS
5.0000 mg | ORAL_TABLET | Freq: Two times a day (BID) | ORAL | Status: DC
  Administered 2023-07-26 – 2023-07-29 (×7): 5 mg via ORAL
  Filled 2023-07-26 (×7): qty 1

## 2023-07-26 MED ORDER — LISINOPRIL 5 MG PO TABS
2.5000 mg | ORAL_TABLET | Freq: Every day | ORAL | Status: DC
  Administered 2023-07-26 – 2023-07-27 (×2): 2.5 mg via ORAL
  Filled 2023-07-26 (×2): qty 1

## 2023-07-26 NOTE — Progress Notes (Signed)
 PROGRESS NOTE    Joseph [name]  FMW:[id number] DOB: April 24, 1931 DOA: 07/25/2023 PCP: Auston Opal, DO    Brief Narrative:   89 y.o. male with medical history significant of GERD, osteoarthritis, diet-controlled DM2, HTN, history of prostate cancer, lung cancer status post lobectomy, myasthenia gravis on Vyvgart /Mestinon /prednisone  sent to the hospital for tachycardia.  In show he started on Cardizem drip which was later changed to amiodarone.  Cardiology planning on TEE cardioversion after esophagram has been completed.   Assessment & Plan:  Principal Problem:   Atrial fibrillation with RVR (HCC) Active Problems:   Diet-controlled type 2 diabetes mellitus (HCC)   Myasthenia gravis without exacerbation (HCC)     Atrial fibrillation with RVR - New onset.  TSH is normal.  Echocardiogram shows reduced EF of 40%.  Cardiology started patient on amiodarone drip but still remains in A-fib with RVR.  Will require TEE cardioversion.  Will obtain esophagram before proceeding with this.  Discussed with [facility] Dr. Lourdes contraindications for TEE at this time Discussed with Dr. Tobie  outpatient neurology-no contraindications to propofol /fentanyl  for sedation.  Should avoid any paralytics   Myasthenia gravis without exacerbation - Follows outpatient neurology. On Vyvgart , Mestinon  and prednisone .  Will continue prednisone  and Mestinon  in the hospital.   Essential hypertension - IV as needed   Diet controlled diabetes mellitus type 2 - Supportive care   History of prostate cancer History of lung cancer status post resection - In remission    DVT prophylaxis: Transition to Eliquis    Code Status: Full Code Family Communication: Fair the at bedside Continue inpatient stay as patient will require cardioversion  Subjective:  Patient seen and examined at bedside on amiodarone drip.  Still remains in atrial fibrillation with RVR.  Examination:  General exam: Appears calm and  comfortable  Respiratory system: Clear to auscultation. Respiratory effort normal. Cardiovascular system: Tachycardia, irregularly irregular Gastrointestinal system: Abdomen is nondistended, soft and nontender. No organomegaly or masses felt. Normal bowel sounds heard. Central nervous system: Alert and oriented. No focal neurological deficits. Extremities: Symmetric 5 x 5 power. Skin: No rashes, lesions or ulcers Psychiatry: Judgement and insight appear normal. Mood & affect appropriate.                Diet Orders (From admission, onward)     Start     Ordered   07/25/23 1507  Diet regular Room service appropriate? Yes; Fluid consistency: Thin  Diet effective now       Question Answer Comment  Room service appropriate? Yes   Fluid consistency: Thin      07/25/23 1522            Objective: Vitals:   07/26/23 0518 07/26/23 0548 07/26/23 0826 07/26/23 1150  BP: (!) 94/57 120/74 112/82 99/74  Pulse:   (!) 135 (!) 113  Resp:   17 17  Temp:   97.8 F (36.6 C) 99.3 F (37.4 C)  TempSrc:   Oral Oral  SpO2:   100% 100%  Weight:      Height:        Intake/Output Summary (Last 24 hours) at 07/26/2023 1233 Last data filed at 07/26/2023 0053 Gross per 24 hour  Intake 141.37 ml  Output --  Net 141.37 ml   Filed Weights   07/25/23 1300 07/25/23 2133 07/26/23 0500  Weight: 83.5 kg 83.5 kg 81.9 kg    Scheduled Meds:  apixaban  5 mg Oral BID   [START ON 07/27/2023] aspirin  EC  81 mg Oral Q M,W,F   insulin  aspart  0-9 Units Subcutaneous TID WC   pantoprazole   40 mg Oral BID   predniSONE   5 mg Oral Q breakfast   pyridostigmine   60 mg Oral TID   Continuous Infusions:  amiodarone 30 mg/hr (07/26/23 1056)    Nutritional status     Body mass index is 23.81 kg/m.  Data Reviewed:   CBC: Recent Labs  Lab 07/25/23 1305 07/26/23 0515  WBC 7.6 6.1  NEUTROABS 6.4  --   HGB 11.7* 10.2*  HCT 37.2* 33.0*  MCV 85.7 84.4  PLT 177 155   Basic Metabolic  Panel: Recent Labs  Lab 07/25/23 1305 07/26/23 0515  NA 140 135  K 4.3 4.4  CL 105 101  CO2 22 23  GLUCOSE 143* 308*  BUN 18 20  CREATININE 1.31* 1.41*  CALCIUM 9.8 8.6*  MG  --  2.0  PHOS  --  3.1   GFR: Estimated Creatinine Clearance: 39.4 mL/min (A) (by C-G formula based on SCr of 1.41 mg/dL (H)). Liver Function Tests: No results for input(s): AST, ALT, ALKPHOS, BILITOT, PROT, ALBUMIN in the last 168 hours. No results for input(s): LIPASE, AMYLASE in the last 168 hours. No results for input(s): AMMONIA in the last 168 hours. Coagulation Profile: No results for input(s): INR, PROTIME in the last 168 hours. Cardiac Enzymes: No results for input(s): CKTOTAL, CKMB, CKMBINDEX, TROPONINI in the last 168 hours. BNP (last 3 results) No results for input(s): PROBNP in the last 8760 hours. HbA1C: No results for input(s): HGBA1C in the last 72 hours. CBG: No results for input(s): GLUCAP in the last 168 hours. Lipid Profile: No results for input(s): CHOL, HDL, LDLCALC, TRIG, CHOLHDL, LDLDIRECT in the last 72 hours. Thyroid Function Tests: Recent Labs    07/25/23 1330  TSH 1.483   Anemia Panel: No results for input(s): VITAMINB12, FOLATE, FERRITIN, TIBC, IRON, RETICCTPCT in the last 72 hours. Sepsis Labs: No results for input(s): PROCALCITON, LATICACIDVEN in the last 168 hours.  No results found for this or any previous visit (from the past 240 hours).       Radiology Studies: ECHOCARDIOGRAM COMPLETE Result Date: 07/26/2023    ECHOCARDIOGRAM REPORT   Patient Name:   Joseph [name] Date of Exam: 07/26/2023 Medical Rec #:  [id number]        Height:       73.0 in Accession #:    [id number]       Weight:       180.5 lb Date of Birth:  07-24-1931        BSA:          2.060 m Patient Age:    89 years         BP:           127/95 mmHg Patient Gender: M                HR:           125 bpm. Exam Location:  Inpatient  Procedure: 2D Echo, Cardiac Doppler and Color Doppler (Both Spectral and Color            Flow Doppler were utilized during procedure). Indications:    Cardiomyopathy-Unspecified I42.9  History:        Patient has no prior history of Echocardiogram examinations.                 Risk Factors:Hypertension and Diabetes.  Sonographer:  Jayson Gaskins Referring Phys: Loron Weimer C Minnette Merida IMPRESSIONS  1. Left ventricular ejection fraction, by estimation, is 35 to 40%. The left ventricle has moderately decreased function. The left ventricle demonstrates global hypokinesis. There is moderate concentric left ventricular hypertrophy. Left ventricular diastolic function could not be evaluated.  2. Right ventricular systolic function is mildly reduced. The right ventricular size is mildly enlarged.  3. Left atrial size was mildly dilated.  4. Right atrial size was moderately dilated.  5. A small pericardial effusion is present.  6. The mitral valve is normal in structure. Mild mitral valve regurgitation.  7. Tricuspid valve regurgitation is severe.  8. The aortic valve is normal in structure. Aortic valve regurgitation is mild.  9. Aortic dilatation noted. There is borderline dilatation of the ascending aorta, measuring 39 mm. 10. The inferior vena cava is dilated in size with <50% respiratory variability, suggesting right atrial pressure of 15 mmHg. FINDINGS  Left Ventricle: Left ventricular ejection fraction, by estimation, is 35 to 40%. The left ventricle has moderately decreased function. The left ventricle demonstrates global hypokinesis. The left ventricular internal cavity size was normal in size. There is moderate concentric left ventricular hypertrophy. Left ventricular diastolic function could not be evaluated due to atrial fibrillation. Left ventricular diastolic function could not be evaluated. Right Ventricle: The right ventricular size is mildly enlarged. No increase in right ventricular wall thickness. Right ventricular  systolic function is mildly reduced. Left Atrium: Left atrial size was mildly dilated. Right Atrium: Right atrial size was moderately dilated. Pericardium: A small pericardial effusion is present. Mitral Valve: The mitral valve is normal in structure. Mild mitral valve regurgitation. Tricuspid Valve: The tricuspid valve is normal in structure. Tricuspid valve regurgitation is severe. Aortic Valve: The aortic valve is normal in structure. Aortic valve regurgitation is mild. Aortic valve mean gradient measures 3.0 mmHg. Aortic valve peak gradient measures 4.4 mmHg. Aortic valve area, by VTI measures 2.65 cm. Pulmonic Valve: The pulmonic valve was normal in structure. Pulmonic valve regurgitation is not visualized. Aorta: The aortic root is normal in size and structure and aortic dilatation noted. There is borderline dilatation of the ascending aorta, measuring 39 mm. Venous: The inferior vena cava is dilated in size with less than 50% respiratory variability, suggesting right atrial pressure of 15 mmHg. IAS/Shunts: No atrial level shunt detected by color flow Doppler.  LEFT VENTRICLE PLAX 2D LVIDd:         4.80 cm LVIDs:         4.20 cm LV PW:         1.40 cm LV IVS:        1.30 cm LVOT diam:     1.80 cm LV SV:         39 LV SV Index:   19 LVOT Area:     2.54 cm  RIGHT VENTRICLE RV Basal diam:  4.90 cm RV Mid diam:    4.50 cm RV S prime:     12.20 cm/s TAPSE (M-mode): 1.0 cm LEFT ATRIUM             Index        RIGHT ATRIUM           Index LA Vol (A2C):   42.0 ml 20.39 ml/m  RA Area:     21.50 cm LA Vol (A4C):   76.7 ml 37.24 ml/m  RA Volume:   65.30 ml  31.70 ml/m LA Biplane Vol: 60.8 ml 29.52 ml/m  AORTIC VALVE AV  Area (Vmax):    2.45 cm AV Area (Vmean):   2.47 cm AV Area (VTI):     2.65 cm AV Vmax:           105.00 cm/s AV Vmean:          77.700 cm/s AV VTI:            0.149 m AV Peak Grad:      4.4 mmHg AV Mean Grad:      3.0 mmHg LVOT Vmax:         101.00 cm/s LVOT Vmean:        75.400 cm/s LVOT VTI:           0.155 m LVOT/AV VTI ratio: 1.04  AORTA Ao Root diam: 3.20 cm Ao Asc diam:  3.90 cm MITRAL VALVE MV Area (PHT): 4.77 cm     SHUNTS MV Decel Time: 159 msec     Systemic VTI:  0.16 m MV E velocity: 111.00 cm/s  Systemic Diam: 1.80 cm Aditya Sabharwal Electronically signed by Ria Commander Signature Date/Time: 07/26/2023/11:39:08 AM    Final    DG Chest Portable 1 View Result Date: 07/25/2023 CLINICAL DATA:  Cough, shortness of breath. EXAM: PORTABLE CHEST 1 VIEW COMPARISON:  October 24, 2020. FINDINGS: Stable cardiomegaly. Small left pleural effusion is noted with left basilar atelectasis. Right lung is clear. Bony thorax is unremarkable. IMPRESSION: Small left pleural effusion with left basilar subsegmental atelectasis. Electronically Signed   By: Lynwood Landy Raddle M.D.   On: 07/25/2023 14:07           LOS: 0 days   Time spent= 35 mins    Burgess JAYSON Dare, MD [facility]  If 7PM-7AM, please contact night-coverage  07/26/2023, 12:33 PM

## 2023-07-26 NOTE — TOC CM/SW Note (Signed)
 Transition of Care [facility]) - Inpatient Brief Assessment   Patient Details  Name: [name] MRN: [id number] Date of Birth: 1931-07-13  Transition of Care [facility]) CM/SW Contact:    Sudie Erminio Deems, RN Phone Number: 07/26/2023, 11:36 AM   Clinical Narrative: Patient presented for atrial fibrillation with RVR. PTA patient was from home with support of daughters. Daughters were in the room at the time of the visit. Daughters take the patient to PCP appointments. Patient does not use any DME in the home. Patient uses [facility] on [address]. Benefits check for Eliquis $47.00. No home needs identified at this time.    Transition of Care Asessment: Insurance and Status: Insurance coverage has been reviewed Patient has primary care physician: Yes Home environment has been reviewed: reviewed-from home has support of daughters. Prior level of function:: independent Prior/Current Home Services: No current home services Social Drivers of Health Review: SDOH reviewed no interventions necessary Readmission risk has been reviewed: Yes Transition of care needs: no transition of care needs at this time

## 2023-07-26 NOTE — Progress Notes (Addendum)
 Progress Note  Patient Name: Joseph [name] Date of Encounter: 07/26/2023 [facility] Cardiologist: Lynwood Schilling, MD   Interval Summary   Continues to be in rapid atrial fibrillation although asymptomatic.  Family is accompanied with him today.  He has no complaints of palpitations, chest pain, shortness of breath.  Vital Signs Vitals:   07/26/23 0500 07/26/23 0518 07/26/23 0548 07/26/23 0826  BP:  (!) 94/57 120/74 112/82  Pulse:    (!) 135  Resp:    17  Temp:    97.8 F (36.6 C)  TempSrc:    Oral  SpO2:    100%  Weight: 81.9 kg     Height:        Intake/Output Summary (Last 24 hours) at 07/26/2023 0847 Last data filed at 07/26/2023 0053 Gross per 24 hour  Intake 141.37 ml  Output --  Net 141.37 ml      07/26/2023    5:00 AM 07/25/2023    9:33 PM 07/25/2023    1:00 PM  Last 3 Weights  Weight (lbs) 180 lb 8 oz 184 lb 184 lb  Weight (kg) 81.874 kg 83.462 kg 83.462 kg      Telemetry/ECG  Atrial fibrillation heart rates between 120-140.- Personally Reviewed  Physical Exam  GEN: No acute distress.   Neck: No JVD Cardiac: Irregularly irregular, tachycardic Respiratory: Clear to auscultation bilaterally. GI: Soft, nontender, non-distended  MS: No edema  Patient Profile Patient with past medical history significant for myasthenia gravis, lung cancer status post lobectomy, type 2 diabetes, hypertension, prostate cancer.  Patient seen at neurology office noted irregular rhythm.  Sent to PCPs office who confirmed atrial fibrillation.  Sent to the emergency room.  Assessment & Plan   New onset A-fib RVR Unknown duration, he is asymptomatic.  Heart rates been controlled 120 to 140 mg, soft BP. IV dilt stopped given soft BP and MG.  Fortunately appears to be euvolemic at this time. Transition from IV heparin to Eliquis 5 mg twice daily.  Need to monitor renal function. If >1.5 he will need reduced dosing for age and renal function.  With his myasthenia's  gravis there are relative contraindications to CCB's and beta-blockers (but was tolerating BB on this and tolerating before).  Primary team is discussing with neurology about potential medications.  Speaking with pharmacy, beta-blocker seems to have the most reasonable side effect profile to avoid flareup.  However even adding on beta-blocker I do not think this will achieve target heart rate goals. Currently on IV amiodarone.  Unfortunately running out of other options to rate control, would like to avoid digoxin given advanced age and underlying renal function. Initial plan was to avoid TEE given previous esophageal issues.  In discussing with patient's family these issues seem to have preceded his diagnosis of MG and have improved after getting treatment for this. Suspect potentially we may need to plan for TEE/DCCV given resistant rate control. I have asked primary team to reach out to GI to evaluate this further and to consider TEE. Echocardiogram pending TSH normal  Myasthenia gravis Has undergone treatment for this and has had significant improvement in symptoms.  Follows with neurology. Primary team discussing with neurology about viable medications  Anemia Hemoglobin down to 10.2.  Down from 11.7 on admission.  Monitor this closely, felt to be dilutional by primary team.  Hypertension BP still soft. Toprol -XL stopped with history of MG.  Home hydralazine  and lisinopril  held.     For questions or updates,  please contact [facility] Please consult [email or web address] for contact info under       Signed, Thom LITTIE Sluder, PA-C     History and all data above reviewed.  Patient examined.  I agree with the findings as above. He does not feel his fib.  Echo listed below with newly reduced EF.   The patient exam reveals RNM:Pmmzhlojm  ,  Lungs:  Clear  ,  Abd: Positive bowel sounds, no rebound no guarding, Ext No edema   .  All available labs, radiology testing, previous records reviewed.  Agree with documented assessment and plan.   Atrial fib:  Rate still elevated and we should avoid beta blocker or calcium channel blocker.  Hopefully we will be able to do TEE/DCCV this admission.  See above.  Cardiomyopathy:  Likely related to rate.  Plan to restore NSR, titrate meds as possible (BP very low) and follow EF.    Lynwood Kaelan Emami  2:32 PM  07/26/2023

## 2023-07-26 NOTE — Hospital Course (Addendum)
 Brief Narrative:   89 y.o. male with medical history significant of GERD, osteoarthritis, diet-controlled DM2, HTN, history of prostate cancer, lung cancer status post lobectomy, myasthenia gravis on Vyvgart /Mestinon /prednisone  sent to the hospital for tachycardia.  In show he started on Cardizem  drip which was later changed to amiodarone .  Cardiology planning TEE cardioversion today.  Esophagram does show some achalasia but I do not believe it should delay TEE   Assessment & Plan:  Principal Problem:   Atrial fibrillation with RVR (HCC) Active Problems:   Diet-controlled type 2 diabetes mellitus (HCC)   Myasthenia gravis without exacerbation (HCC)     Atrial fibrillation with RVR - New onset.  TSH is normal.  Echocardiogram shows reduced EF of 40%.  Cardiology started patient on amiodarone  drip but still remains in A-fib with RVR.  Planned TEE cardioversion today.  Esophagram does show some achalasia which can be followed up outpatient.  Patient aware that he will need to discuss his anticoagulation with GI if he decides to get anything done in terms of his achalasia  Discussed with [facility] Dr. Lourdes contraindications for TEE at this time Discussed with Dr. Tobie  outpatient neurology-no contraindications to propofol /fentanyl  for sedation.  Should avoid any paralytics   Myasthenia gravis without exacerbation - Follows outpatient neurology. On Vyvgart , Mestinon  and prednisone .  Will continue prednisone  and Mestinon  in the hospital.   Essential hypertension - IV as needed   Diet controlled diabetes mellitus type 2 - Supportive care   History of prostate cancer History of lung cancer status post resection - In remission    DVT prophylaxis: Transition to Eliquis     Code Status: Full Code Family Communication: Family e at bedside Continue inpatient stay as patient will require cardioversion  Subjective: Awaiting his TEE cardioversion today. Patient has no new  complaints.  Examination:  General exam: Appears calm and comfortable  Respiratory system: Clear to auscultation. Respiratory effort normal. Cardiovascular system: Tachycardia, irregularly irregular Gastrointestinal system: Abdomen is nondistended, soft and nontender. No organomegaly or masses felt. Normal bowel sounds heard. Central nervous system: Alert and oriented. No focal neurological deficits. Extremities: Symmetric 5 x 5 power. Skin: No rashes, lesions or ulcers Psychiatry: Judgement and insight appear normal. Mood & affect appropriate.

## 2023-07-26 NOTE — Plan of Care (Signed)
  Problem: Clinical Measurements: Goal: Cardiovascular complication will be avoided Outcome: Progressing   Problem: Activity: Goal: Risk for activity intolerance will decrease Outcome: Progressing   Problem: Nutrition: Goal: Adequate nutrition will be maintained Outcome: Progressing   Problem: Elimination: Goal: Will not experience complications related to bowel motility Outcome: Progressing   Problem: Skin Integrity: Goal: Risk for impaired skin integrity will decrease Outcome: Progressing

## 2023-07-26 NOTE — Progress Notes (Addendum)
 Spoke with MD, will plan for TEE/DCCV tomorrow as long as esophagram today is within normal limits.  Also requesting neurology consult to evaluate given anesthesia risks with myasthenia gravis.  .Informed Consent   Shared Decision Making/Informed Consent{    The risks [stroke, cardiac arrhythmias rarely resulting in the need for a temporary or permanent pacemaker, skin irritation or burns, esophageal damage, perforation (1:10,000 risk), bleeding, pharyngeal hematoma as well as other potential complications associated with conscious sedation including aspiration, arrhythmia, respiratory failure and death], benefits (treatment guidance, restoration of normal sinus rhythm, diagnostic support) and alternatives of a transesophageal echocardiogram guided cardioversion were discussed in detail with Joseph [name] and he is willing to proceed.       Addendum:  Per outptn Neuro Dr Tonita Blanch We recommend avoiding nondepolarizing neuromuscular blocking agents (rocuronium ), but that's usually when it involves general anesthesia.  For TEE, I'm not sure what they use.  Propofol  and fentanyl  is fine for MG

## 2023-07-26 NOTE — Progress Notes (Signed)
 PHARMACY - ANTICOAGULATION  Pharmacy Consult for Heparin Indication: atrial fibrillation Brief A/P: Heparin level within goal range Continue Heparin at current rate   Allergies  Allergen Reactions   No Known Allergies Other (See Comments)    Pt has no known allergies, but has Hx of Myasthenia Gravis    Patient Measurements: Height: 6' 1 (185.4 cm) Weight: 83.5 kg (184 lb) IBW/kg (Calculated) : 79.9 HEPARIN DW (KG): 83.5  Vital Signs: Temp: 98.5 F (36.9 C) (06/25 1924) Temp Source: Oral (06/25 1924) BP: 101/74 (06/25 1924) Pulse Rate: 128 (06/25 1924)  Labs: Recent Labs    07/25/23 1305 07/25/23 2342  HGB 11.7*  --   HCT 37.2*  --   PLT 177  --   HEPARINUNFRC  --  0.47  CREATININE 1.31*  --     Estimated Creatinine Clearance: 42.4 mL/min (A) (by C-G formula based on SCr of 1.31 mg/dL (H)).   Assessment: 89 y.o. male with Afib for heparin  Goal of Therapy:  Monitor platelets by anticoagulation protocol: Yes   Plan:  No change to heparin  Cathlyn Arrant, PharmD, BCPS

## 2023-07-26 NOTE — Discharge Instructions (Addendum)
 Information on my medicine - ELIQUIS (apixaban)  This medication education was reviewed with me or my healthcare representative as part of my discharge preparation.  The pharmacist that spoke with me during my hospital stay was:  Jinnie JONETTA Door, [facility]  Why was Eliquis prescribed for you? Eliquis was prescribed for you to reduce the risk of a blood clot forming that can cause a stroke if you have a medical condition called atrial fibrillation (a type of irregular heartbeat).  What do You need to know about Eliquis ? Take your Eliquis TWICE DAILY - one tablet in the morning and one tablet in the evening with or without food. If you have difficulty swallowing the tablet whole please discuss with your pharmacist how to take the medication safely.  Take Eliquis exactly as prescribed by your doctor and DO NOT stop taking Eliquis without talking to the doctor who prescribed the medication.  Stopping may increase your risk of developing a stroke.  Refill your prescription before you run out.  After discharge, you should have regular check-up appointments with your healthcare provider that is prescribing your Eliquis.  In the future your dose may need to be changed if your kidney function or weight changes by a significant amount or as you get older.  What do you do if you miss a dose? If you miss a dose, take it as soon as you remember on the same day and resume taking twice daily.  Do not take more than one dose of ELIQUIS at the same time to make up a missed dose.  Important Safety Information A possible side effect of Eliquis is bleeding. You should call your healthcare provider right away if you experience any of the following: Bleeding from an injury or your nose that does not stop. Unusual colored urine (red or dark brown) or unusual colored stools (red or black). Unusual bruising for unknown reasons. A serious fall or if you hit your head (even if there is no bleeding).  Some medicines  may interact with Eliquis and might increase your risk of bleeding or clotting while on Eliquis. To help avoid this, consult your healthcare provider or pharmacist prior to using any new prescription or non-prescription medications, including herbals, vitamins, non-steroidal anti-inflammatory drugs (NSAIDs) and supplements.  This website has more information on Eliquis (apixaban): http://www.eliquis.com/eliquis/home =============================================  Atrial Fibrillation    Atrial fibrillation is a type of heartbeat that is irregular or fast. If you have this condition, your heart beats without any order. This makes it hard for your heart to pump blood in a normal way. Atrial fibrillation may come and go, or it may become a long-lasting problem. If this condition is not treated, it can put you at higher risk for stroke, heart failure, and other heart problems.  What are the causes? This condition may be caused by diseases that damage the heart. They include: High blood pressure. Heart failure. Heart valve disease. Heart surgery. Other causes include: Diabetes. Thyroid disease. Being overweight. Kidney disease. Sometimes the cause is not known.  What increases the risk? You are more likely to develop this condition if: You are older. You smoke. You exercise often and very hard. You have a family history of this condition. You are a man. You use drugs. You drink a lot of alcohol. You have lung conditions, such as emphysema, pneumonia, or COPD. You have sleep apnea.  What are the signs or symptoms? Common symptoms of this condition include: A feeling that your heart  is beating very fast. Chest pain or discomfort. Feeling short of breath. Suddenly feeling light-headed or weak. Getting tired easily during activity. Fainting. Sweating. In some cases, there are no symptoms.  How is this treated? Treatment for this condition depends on underlying conditions and  how you feel when you have atrial fibrillation. They include: Medicines to: Prevent blood clots. Treat heart rate or heart rhythm problems. Using devices, such as a pacemaker, to correct heart rhythm problems. Doing surgery to remove the part of the heart that sends bad signals. Closing an area where clots can form in the heart (left atrial appendage). In some cases, your doctor will treat other underlying conditions.  Follow these instructions at home:  Medicines Take over-the-counter and prescription medicines only as told by your doctor. Do not take any new medicines without first talking to your doctor. If you are taking blood thinners: Talk with your doctor before you take any medicines that have aspirin  or NSAIDs, such as ibuprofen, in them. Take your medicine exactly as told by your doctor. Take it at the same time each day. Avoid activities that could hurt or bruise you. Follow instructions about how to prevent falls. Wear a bracelet that says you are taking blood thinners. Or, carry a card that lists what medicines you take. Lifestyle         Do not use any products that have nicotine or tobacco in them. These include cigarettes, e-cigarettes, and chewing tobacco. If you need help quitting, ask your doctor. Eat heart-healthy foods. Talk with your doctor about the right eating plan for you. Exercise regularly as told by your doctor. Do not drink alcohol. Lose weight if you are overweight. Do not use drugs, including cannabis.  General instructions If you have a condition that causes breathing to stop for a short period of time (apnea), treat it as told by your doctor. Keep a healthy weight. Do not use diet pills unless your doctor says they are safe for you. Diet pills may make heart problems worse. Keep all follow-up visits as told by your doctor. This is important.  Contact a doctor if: You notice a change in the speed, rhythm, or strength of your heartbeat. You are  taking a blood-thinning medicine and you get more bruising. You get tired more easily when you move or exercise. You have a sudden change in weight.  Get help right away if:    You have pain in your chest or your belly (abdomen). You have trouble breathing. You have side effects of blood thinners, such as blood in your vomit, poop (stool), or pee (urine), or bleeding that cannot stop. You have any signs of a stroke. BE FAST is an easy way to remember the main warning signs: B - Balance. Signs are dizziness, sudden trouble walking, or loss of balance. E - Eyes. Signs are trouble seeing or a change in how you see. F - Face. Signs are sudden weakness or loss of feeling in the face, or the face or eyelid drooping on one side. A - Arms. Signs are weakness or loss of feeling in an arm. This happens suddenly and usually on one side of the body. S - Speech. Signs are sudden trouble speaking, slurred speech, or trouble understanding what people say. T - Time. Time to call emergency services. Write down what time symptoms started. You have other signs of a stroke, such as: A sudden, very bad headache with no known cause. Feeling like you may vomit (nausea).  Vomiting. A seizure.  These symptoms may be an emergency. Do not wait to see if the symptoms will go away. Get medical help right away. Call your local emergency services (911 in the [place] Do not drive yourself to the hospital. Summary Atrial fibrillation is a type of heartbeat that is irregular or fast. You are at higher risk of this condition if you smoke, are older, have diabetes, or are overweight. Follow your doctor's instructions about medicines, diet, exercise, and follow-up visits. Get help right away if you have signs or symptoms of a stroke. Get help right away if you cannot catch your breath, or you have chest pain or discomfort. This information is not intended to replace advice given to you by your health care provider. Make  sure you discuss any questions you have with your health care provider. Document Revised: 07/10/2018 Document Reviewed: 07/10/2018 Elsevier Patient Education  2020 [employer]    ===============================  Atrial Fibrillation    Atrial fibrillation is a type of heartbeat that is irregular or fast. If you have this condition, your heart beats without any order. This makes it hard for your heart to pump blood in a normal way. Atrial fibrillation may come and go, or it may become a long-lasting problem. If this condition is not treated, it can put you at higher risk for stroke, heart failure, and other heart problems.  What are the causes? This condition may be caused by diseases that damage the heart. They include: High blood pressure. Heart failure. Heart valve disease. Heart surgery. Other causes include: Diabetes. Thyroid disease. Being overweight. Kidney disease. Sometimes the cause is not known.  What increases the risk? You are more likely to develop this condition if: You are older. You smoke. You exercise often and very hard. You have a family history of this condition. You are a man. You use drugs. You drink a lot of alcohol. You have lung conditions, such as emphysema, pneumonia, or COPD. You have sleep apnea.  What are the signs or symptoms? Common symptoms of this condition include: A feeling that your heart is beating very fast. Chest pain or discomfort. Feeling short of breath. Suddenly feeling light-headed or weak. Getting tired easily during activity. Fainting. Sweating. In some cases, there are no symptoms.  How is this treated? Treatment for this condition depends on underlying conditions and how you feel when you have atrial fibrillation. They include: Medicines to: Prevent blood clots. Treat heart rate or heart rhythm problems. Using devices, such as a pacemaker, to correct heart rhythm problems. Doing surgery to remove the part of the  heart that sends bad signals. Closing an area where clots can form in the heart (left atrial appendage). In some cases, your doctor will treat other underlying conditions.  Follow these instructions at home:  Medicines Take over-the-counter and prescription medicines only as told by your doctor. Do not take any new medicines without first talking to your doctor. If you are taking blood thinners: Talk with your doctor before you take any medicines that have aspirin  or NSAIDs, such as ibuprofen, in them. Take your medicine exactly as told by your doctor. Take it at the same time each day. Avoid activities that could hurt or bruise you. Follow instructions about how to prevent falls. Wear a bracelet that says you are taking blood thinners. Or, carry a card that lists what medicines you take. Lifestyle         Do not use any products that have nicotine  or tobacco in them. These include cigarettes, e-cigarettes, and chewing tobacco. If you need help quitting, ask your doctor. Eat heart-healthy foods. Talk with your doctor about the right eating plan for you. Exercise regularly as told by your doctor. Do not drink alcohol. Lose weight if you are overweight. Do not use drugs, including cannabis.  General instructions If you have a condition that causes breathing to stop for a short period of time (apnea), treat it as told by your doctor. Keep a healthy weight. Do not use diet pills unless your doctor says they are safe for you. Diet pills may make heart problems worse. Keep all follow-up visits as told by your doctor. This is important.  Contact a doctor if: You notice a change in the speed, rhythm, or strength of your heartbeat. You are taking a blood-thinning medicine and you get more bruising. You get tired more easily when you move or exercise. You have a sudden change in weight.  Get help right away if:    You have pain in your chest or your belly (abdomen). You have trouble  breathing. You have side effects of blood thinners, such as blood in your vomit, poop (stool), or pee (urine), or bleeding that cannot stop. You have any signs of a stroke. BE FAST is an easy way to remember the main warning signs: B - Balance. Signs are dizziness, sudden trouble walking, or loss of balance. E - Eyes. Signs are trouble seeing or a change in how you see. F - Face. Signs are sudden weakness or loss of feeling in the face, or the face or eyelid drooping on one side. A - Arms. Signs are weakness or loss of feeling in an arm. This happens suddenly and usually on one side of the body. S - Speech. Signs are sudden trouble speaking, slurred speech, or trouble understanding what people say. T - Time. Time to call emergency services. Write down what time symptoms started. You have other signs of a stroke, such as: A sudden, very bad headache with no known cause. Feeling like you may vomit (nausea). Vomiting. A seizure.  These symptoms may be an emergency. Do not wait to see if the symptoms will go away. Get medical help right away. Call your local emergency services (911 in the [place] Do not drive yourself to the hospital. Summary Atrial fibrillation is a type of heartbeat that is irregular or fast. You are at higher risk of this condition if you smoke, are older, have diabetes, or are overweight. Follow your doctor's instructions about medicines, diet, exercise, and follow-up visits. Get help right away if you have signs or symptoms of a stroke. Get help right away if you cannot catch your breath, or you have chest pain or discomfort. This information is not intended to replace advice given to you by your health care provider. Make sure you discuss any questions you have with your health care provider. Document Revised: 07/10/2018 Document Reviewed: 07/10/2018 Elsevier Patient Education  2020 [employer].

## 2023-07-26 NOTE — Progress Notes (Addendum)
 PHARMACY - ANTICOAGULATION CONSULT NOTE  Pharmacy Consult for Heparin Indication: atrial fibrillation  Allergies  Allergen Reactions   No Known Allergies Other (See Comments)    Pt has no known allergies, but has Hx of Myasthenia Gravis    Patient Measurements: Height: 6' 1 (185.4 cm) Weight: 81.9 kg (180 lb 8 oz) IBW/kg (Calculated) : 79.9 HEPARIN DW (KG): 83.5  Vital Signs: Temp: 97.8 F (36.6 C) (06/26 0342) Temp Source: Oral (06/26 0342) BP: 98/70 (06/26 0342) Pulse Rate: 125 (06/26 0342)  Labs: Recent Labs    07/25/23 1305 07/25/23 2342 07/26/23 0515  HGB 11.7*  --  10.2*  HCT 37.2*  --  33.0*  PLT 177  --  155  HEPARINUNFRC  --  0.47 0.45  CREATININE 1.31*  --  1.41*    Estimated Creatinine Clearance: 39.4 mL/min (A) (by C-G formula based on SCr of 1.41 mg/dL (H)).   Medical History: Past Medical History:  Diagnosis Date   Arthritis    Cancer (HCC)    HX PROSTATE CANCER - MANY YRS AGO - ? RADIATION [place]. Lung cancer   Diabetes mellitus without complication (HCC)    DIET CONTROLLED   GERD (gastroesophageal reflux disease)    Graves disease    Hearing loss    History of hiatal hernia    History of kidney stones    History of nonunion of fracture    LEFT PROXIMAL FEMUR   Hypertension    Pneumonia    Seasonal allergies     Assessment: 89 yom with a history of DM, GERD, HTN, hx of prostate cancer, lung cancer s/p lt upper lobe resection, myasthenia gravis presenting after being sent from neurology appointment where he was noted to be in AF. Apixaban per pharmacy consult initially placed for AF but now electing for heparin therapy .    Apixaban cost per patient advocate team: $47 for 30-day supply  Heparin level 0.45 is therapeutic on 1200 units/hr. Hgb drop likely due to IV fluidsfrom infusions.  Goal of Therapy:  Monitor platelets by anticoagulation protocol: Yes   Plan:  Continue heparin 1200 units/hr  Monitor daily heparin level, CBC,  signs/symptoms of bleeding   ADDENDUM 9:00- received consult for apixaban. Patient does not technically meet criteria for apixaban dose adjustment but Scr is very close at 1.4 (cutoff is >/= 1.5) and age is 73. CHADSVASc = 4. Discussed with team, will start apixaban 5mg  BID and adjust if needed.   Jinnie Door, PharmD, BCPS, BCCP Clinical Pharmacist  Please check AMION for all [facility] phone numbers After 10:00 PM, call Main Pharmacy [phone]

## 2023-07-27 ENCOUNTER — Encounter (HOSPITAL_COMMUNITY)
Admission: EM | Disposition: A | Discharge status: Home / Self Care | Payer: Self-pay | Source: Home / Self Care | Attending: Internal Medicine

## 2023-07-27 ENCOUNTER — Inpatient Hospital Stay (HOSPITAL_COMMUNITY)

## 2023-07-27 ENCOUNTER — Inpatient Hospital Stay (HOSPITAL_COMMUNITY): Admitting: Certified Registered"

## 2023-07-27 ENCOUNTER — Encounter (HOSPITAL_COMMUNITY): Payer: Self-pay | Admitting: Cardiology

## 2023-07-27 DIAGNOSIS — I361 Nonrheumatic tricuspid (valve) insufficiency: Secondary | ICD-10-CM

## 2023-07-27 DIAGNOSIS — I4891 Unspecified atrial fibrillation: Secondary | ICD-10-CM

## 2023-07-27 DIAGNOSIS — E119 Type 2 diabetes mellitus without complications: Secondary | ICD-10-CM

## 2023-07-27 DIAGNOSIS — I340 Nonrheumatic mitral (valve) insufficiency: Secondary | ICD-10-CM

## 2023-07-27 DIAGNOSIS — I351 Nonrheumatic aortic (valve) insufficiency: Secondary | ICD-10-CM

## 2023-07-27 DIAGNOSIS — I509 Heart failure, unspecified: Secondary | ICD-10-CM

## 2023-07-27 DIAGNOSIS — I110 Hypertensive heart disease with heart failure: Secondary | ICD-10-CM

## 2023-07-27 HISTORY — PX: CARDIOVERSION: EP1203

## 2023-07-27 HISTORY — PX: TRANSESOPHAGEAL ECHOCARDIOGRAM (CATH LAB): EP1270

## 2023-07-27 LAB — BASIC METABOLIC PANEL WITH GFR
Anion gap: 13 (ref 5–15)
BUN: 20 mg/dL (ref 8–23)
CO2: 23 mmol/L (ref 22–32)
Calcium: 9.3 mg/dL (ref 8.9–10.3)
Chloride: 105 mmol/L (ref 98–111)
Creatinine, Ser: 1.33 mg/dL — ABNORMAL HIGH (ref 0.61–1.24)
GFR, Estimated: 51 mL/min — ABNORMAL LOW (ref >60)
Glucose, Bld: 145 mg/dL — ABNORMAL HIGH (ref 70–99)
Potassium: 3.7 mmol/L (ref 3.5–5.1)
Sodium: 141 mmol/L (ref 135–145)

## 2023-07-27 LAB — CBC
HCT: 36.1 % — ABNORMAL LOW (ref 39.0–52.0)
Hemoglobin: 11.5 g/dL — ABNORMAL LOW (ref 13.0–17.0)
MCH: 27.1 pg (ref 26.0–34.0)
MCHC: 31.9 g/dL (ref 30.0–36.0)
MCV: 84.9 fL (ref 80.0–100.0)
Platelets: 152 10*3/uL (ref 150–400)
RBC: 4.25 MIL/uL (ref 4.22–5.81)
RDW: 14 % (ref 11.5–15.5)
WBC: 7.4 10*3/uL (ref 4.0–10.5)
nRBC: 0 % (ref 0.0–0.2)

## 2023-07-27 LAB — MAGNESIUM: Magnesium: 2 mg/dL (ref 1.7–2.4)

## 2023-07-27 LAB — GLUCOSE, CAPILLARY
Glucose-Capillary: 141 mg/dL — ABNORMAL HIGH (ref 70–99)
Glucose-Capillary: 146 mg/dL — ABNORMAL HIGH (ref 70–99)
Glucose-Capillary: 156 mg/dL — ABNORMAL HIGH (ref 70–99)
Glucose-Capillary: 126 mg/dL — ABNORMAL HIGH (ref 70–99)

## 2023-07-27 LAB — ECHO TEE

## 2023-07-27 SURGERY — TRANSESOPHAGEAL ECHOCARDIOGRAM (TEE) (CATHLAB)
Anesthesia: Monitor Anesthesia Care

## 2023-07-27 MED ORDER — POTASSIUM CHLORIDE 20 MEQ PO PACK
40.0000 meq | PACK | Freq: Once | ORAL | Status: AC
  Administered 2023-07-27: 40 meq via ORAL
  Filled 2023-07-27: qty 2

## 2023-07-27 MED ORDER — PROPOFOL 500 MG/50ML IV EMUL
INTRAVENOUS | Status: DC | PRN
  Administered 2023-07-27: 35 ug/kg/min via INTRAVENOUS

## 2023-07-27 MED ORDER — LOSARTAN POTASSIUM 25 MG PO TABS
25.0000 mg | ORAL_TABLET | Freq: Every day | ORAL | Status: DC
  Administered 2023-07-28 – 2023-07-29 (×2): 25 mg via ORAL
  Filled 2023-07-27 (×2): qty 1

## 2023-07-27 MED ORDER — POTASSIUM CHLORIDE CRYS ER 20 MEQ PO TBCR
40.0000 meq | EXTENDED_RELEASE_TABLET | Freq: Once | ORAL | Status: DC
  Filled 2023-07-27: qty 2

## 2023-07-27 MED ORDER — FENTANYL CITRATE (PF) 100 MCG/2ML IJ SOLN
INTRAMUSCULAR | Status: AC
  Filled 2023-07-27: qty 2

## 2023-07-27 MED ORDER — FENTANYL CITRATE (PF) 100 MCG/2ML IJ SOLN
INTRAMUSCULAR | Status: DC | PRN
  Administered 2023-07-27: 25 ug via INTRAVENOUS

## 2023-07-27 SURGICAL SUPPLY — 1 items: PAD DEFIB RADIO PHYSIO CONN (PAD) ×1 IMPLANT

## 2023-07-27 NOTE — Transfer of Care (Signed)
 Immediate Anesthesia Transfer of Care Note  Patient: Joseph [name]  Procedure(s) Performed: TRANSESOPHAGEAL ECHOCARDIOGRAM CARDIOVERSION  Patient Location: PACU  Anesthesia Type:MAC  Level of Consciousness: awake and drowsy  Airway & Oxygen Therapy: Patient Spontanous Breathing and Patient connected to nasal cannula oxygen  Post-op Assessment: Report given to RN and Post -op Vital signs reviewed and stable  Post vital signs: Reviewed and stable  Last Vitals:  Vitals Value Taken Time  BP    Temp    Pulse 96 07/27/23 13:57  Resp 23 07/27/23 13:57  SpO2 100 % 07/27/23 13:57  Vitals shown include unfiled device data.  Last Pain:  Vitals:   07/27/23 1321  TempSrc:   PainSc: 0-No pain         Complications: No notable events documented.

## 2023-07-27 NOTE — Anesthesia Postprocedure Evaluation (Signed)
 Anesthesia Post Note  Patient: Joseph [name]  Procedure(s) Performed: TRANSESOPHAGEAL ECHOCARDIOGRAM CARDIOVERSION     Patient location during evaluation: PACU Anesthesia Type: MAC Level of consciousness: awake and alert Pain management: pain level controlled Vital Signs Assessment: post-procedure vital signs reviewed and stable Respiratory status: spontaneous breathing, nonlabored ventilation and respiratory function stable Cardiovascular status: blood pressure returned to baseline and stable Postop Assessment: no apparent nausea or vomiting Anesthetic complications: no   No notable events documented.  Last Vitals:  Vitals:   07/27/23 1434 07/27/23 1443  BP: 120/85 115/78  Pulse:    Resp:    Temp:    SpO2:      Last Pain:  Vitals:   07/27/23 1443  TempSrc:   PainSc: 0-No pain                 Butler Levander Pinal

## 2023-07-27 NOTE — Plan of Care (Signed)

## 2023-07-27 NOTE — Progress Notes (Signed)
 PROGRESS NOTE    Joseph [name]  FMW:[id number] DOB: 1931-11-27 DOA: 07/25/2023 PCP: Auston Opal, DO    Brief Narrative:   89 y.o. male with medical history significant of GERD, osteoarthritis, diet-controlled DM2, HTN, history of prostate cancer, lung cancer status post lobectomy, myasthenia gravis on Vyvgart /Mestinon /prednisone  sent to the hospital for tachycardia.  In show he started on Cardizem  drip which was later changed to amiodarone .  Cardiology planning TEE cardioversion today.  Esophagram does show some achalasia but I do not believe it should delay TEE   Assessment & Plan:  Principal Problem:   Atrial fibrillation with RVR (HCC) Active Problems:   Diet-controlled type 2 diabetes mellitus (HCC)   Myasthenia gravis without exacerbation (HCC)     Atrial fibrillation with RVR - New onset.  TSH is normal.  Echocardiogram shows reduced EF of 40%.  Cardiology started patient on amiodarone  drip but still remains in A-fib with RVR.  Planned TEE cardioversion today.  Esophagram does show some achalasia which can be followed up outpatient.  Patient aware that he will need to discuss his anticoagulation with GI if he decides to get anything done in terms of his achalasia  Discussed with [facility] Dr. Lourdes contraindications for TEE at this time Discussed with Dr. Tobie  outpatient neurology-no contraindications to propofol /fentanyl  for sedation.  Should avoid any paralytics   Myasthenia gravis without exacerbation - Follows outpatient neurology. On Vyvgart , Mestinon  and prednisone .  Will continue prednisone  and Mestinon  in the hospital.   Essential hypertension - IV as needed   Diet controlled diabetes mellitus type 2 - Supportive care   History of prostate cancer History of lung cancer status post resection - In remission    DVT prophylaxis: Transition to Eliquis     Code Status: Full Code Family Communication: Family e at bedside Continue inpatient stay as patient will  require cardioversion  Subjective: Awaiting his TEE cardioversion today. Patient has no new complaints.  Examination:  General exam: Appears calm and comfortable  Respiratory system: Clear to auscultation. Respiratory effort normal. Cardiovascular system: Tachycardia, irregularly irregular Gastrointestinal system: Abdomen is nondistended, soft and nontender. No organomegaly or masses felt. Normal bowel sounds heard. Central nervous system: Alert and oriented. No focal neurological deficits. Extremities: Symmetric 5 x 5 power. Skin: No rashes, lesions or ulcers Psychiatry: Judgement and insight appear normal. Mood & affect appropriate.                Diet Orders (From admission, onward)     Start     Ordered   07/27/23 0001  Diet NPO time specified  Diet effective midnight       Comments: Except medications   07/26/23 2220            Objective: Vitals:   07/26/23 1943 07/26/23 2329 07/27/23 0343 07/27/23 0827  BP: 96/69 (!) 116/90 138/84 115/78  Pulse: (!) 127 (!) 121 (!) 135 (!) 110  Resp: 16 20 18 16   Temp: 97.9 F (36.6 C) 98.4 F (36.9 C) 98.4 F (36.9 C) 98.9 F (37.2 C)  TempSrc: Oral Oral Oral Oral  SpO2: 97% 95% 95% 95%  Weight:   80.8 kg   Height:        Intake/Output Summary (Last 24 hours) at 07/27/2023 0903 Last data filed at 07/27/2023 0355 Gross per 24 hour  Intake 480 ml  Output --  Net 480 ml   Filed Weights   07/25/23 2133 07/26/23 0500 07/27/23 0343  Weight: 83.5 kg 81.9 kg  80.8 kg    Scheduled Meds:  apixaban   5 mg Oral BID   insulin  aspart  0-9 Units Subcutaneous TID WC   lisinopril   2.5 mg Oral Daily   pantoprazole   40 mg Oral BID   predniSONE   5 mg Oral Q breakfast   pyridostigmine   60 mg Oral TID   Continuous Infusions:  sodium chloride      amiodarone  30 mg/hr (07/27/23 0012)    Nutritional status     Body mass index is 23.51 kg/m.  Data Reviewed:   CBC: Recent Labs  Lab 07/25/23 1305 07/26/23 0515  07/27/23 0417  WBC 7.6 6.1 7.4  NEUTROABS 6.4  --   --   HGB 11.7* 10.2* 11.5*  HCT 37.2* 33.0* 36.1*  MCV 85.7 84.4 84.9  PLT 177 155 152   Basic Metabolic Panel: Recent Labs  Lab 07/25/23 1305 07/26/23 0515 07/27/23 0417  NA 140 135 141  K 4.3 4.4 3.7  CL 105 101 105  CO2 22 23 23   GLUCOSE 143* 308* 145*  BUN 18 20 20   CREATININE 1.31* 1.41* 1.33*  CALCIUM 9.8 8.6* 9.3  MG  --  2.0 2.0  PHOS  --  3.1  --    GFR: Estimated Creatinine Clearance: 41.7 mL/min (A) (by C-G formula based on SCr of 1.33 mg/dL (H)). Liver Function Tests: No results for input(s): AST, ALT, ALKPHOS, BILITOT, PROT, ALBUMIN in the last 168 hours. No results for input(s): LIPASE, AMYLASE in the last 168 hours. No results for input(s): AMMONIA in the last 168 hours. Coagulation Profile: No results for input(s): INR, PROTIME in the last 168 hours. Cardiac Enzymes: No results for input(s): CKTOTAL, CKMB, CKMBINDEX, TROPONINI in the last 168 hours. BNP (last 3 results) No results for input(s): PROBNP in the last 8760 hours. HbA1C: Recent Labs    07/26/23 0513  HGBA1C 6.9*   CBG: Recent Labs  Lab 07/26/23 1820 07/26/23 2111 07/27/23 0831  GLUCAP 172* 142* 141*   Lipid Profile: No results for input(s): CHOL, HDL, LDLCALC, TRIG, CHOLHDL, LDLDIRECT in the last 72 hours. Thyroid Function Tests: Recent Labs    07/25/23 1330  TSH 1.483   Anemia Panel: No results for input(s): VITAMINB12, FOLATE, FERRITIN, TIBC, IRON, RETICCTPCT in the last 72 hours. Sepsis Labs: No results for input(s): PROCALCITON, LATICACIDVEN in the last 168 hours.  No results found for this or any previous visit (from the past 240 hours).       Radiology Studies: DG ESOPHAGUS W SINGLE CM (SOL OR THIN BA) Result Date: 07/26/2023 CLINICAL DATA:  Provided history: Esophageal dysmotility. Additional history: History of achalasia. Prior lower esophageal  sphincter botox, Heller myotomy/esophageal dilation and fundoplication. Plan for possible transesophageal echocardiogram. EXAM: ESOPHAGUS/BARIUM SWALLOW/TABLET STUDY TECHNIQUE: A single contrast examination was performed using thin liquid barium. Additionally, the patient swallowed a 13 mm barium tablet under fluoroscopy. The exam was performed by [place], NP, and was supervised and interpreted by Dr. Rockey golden. FLUOROSCOPY: Radiation Exposure Index (as provided by the fluoroscopic device): 44.40 mGy Kerma COMPARISON:  Chest CT 09/14/2022. Prior esophagrams 09/13/2022 and earlier. FINDINGS: Postoperative changes from prior fundoplication. Narrowed appearance of the distal esophagus/GE junction. Moderately patulous esophagus elsewhere. A swallowed 13 mm barium tablet did not pass beyond the level of the distal esophagus/GE junction despite a prolonged period of observation, and despite the patient taking additional swallows of water  and liquid barium contrast. Moderate esophageal dysmotility with tertiary contractions. Delayed esophageal contrast transit. No gastroesophageal reflux observed. Prior  ACDF. IMPRESSION: 1. Similar to the prior esophagram of 09/13/2022, there is a narrowed appearance of the distal esophagus/gastroesophageal junction. While this is at least partly related to prior fundoplication, recurrent achalasia and stricture are also considerations. A swallowed 13 mm barium tablet did not pass beyond this point despite a prolonged period of observation, and despite the patient taking additional swallows of water  and liquid barium contrast. 2. The esophagus is moderately patulous elsewhere. 3. Moderate esophageal dysmotility with tertiary contractions. 4. Delayed esophageal contrast transit. Electronically Signed   By: Rockey Childs D.O.   On: 07/26/2023 16:22   ECHOCARDIOGRAM COMPLETE Result Date: 07/26/2023    ECHOCARDIOGRAM REPORT   Patient Name:   Joseph [name] Date of Exam:  07/26/2023 Medical Rec #:  [id number]        Height:       73.0 in Accession #:    [id number]       Weight:       180.5 lb Date of Birth:  1931/08/26        BSA:          2.060 m Patient Age:    89 years         BP:           127/95 mmHg Patient Gender: M                HR:           125 bpm. Exam Location:  Inpatient Procedure: 2D Echo, Cardiac Doppler and Color Doppler (Both Spectral and Color            Flow Doppler were utilized during procedure). Indications:    Cardiomyopathy-Unspecified I42.9  History:        Patient has no prior history of Echocardiogram examinations.                 Risk Factors:Hypertension and Diabetes.  Sonographer:    Jayson Gaskins Referring Phys: Lloyd Cullinan C Marjani Kobel IMPRESSIONS  1. Left ventricular ejection fraction, by estimation, is 35 to 40%. The left ventricle has moderately decreased function. The left ventricle demonstrates global hypokinesis. There is moderate concentric left ventricular hypertrophy. Left ventricular diastolic function could not be evaluated.  2. Right ventricular systolic function is mildly reduced. The right ventricular size is mildly enlarged.  3. Left atrial size was mildly dilated.  4. Right atrial size was moderately dilated.  5. A small pericardial effusion is present.  6. The mitral valve is normal in structure. Mild mitral valve regurgitation.  7. Tricuspid valve regurgitation is severe.  8. The aortic valve is normal in structure. Aortic valve regurgitation is mild.  9. Aortic dilatation noted. There is borderline dilatation of the ascending aorta, measuring 39 mm. 10. The inferior vena cava is dilated in size with <50% respiratory variability, suggesting right atrial pressure of 15 mmHg. FINDINGS  Left Ventricle: Left ventricular ejection fraction, by estimation, is 35 to 40%. The left ventricle has moderately decreased function. The left ventricle demonstrates global hypokinesis. The left ventricular internal cavity size was normal in size. There is moderate  concentric left ventricular hypertrophy. Left ventricular diastolic function could not be evaluated due to atrial fibrillation. Left ventricular diastolic function could not be evaluated. Right Ventricle: The right ventricular size is mildly enlarged. No increase in right ventricular wall thickness. Right ventricular systolic function is mildly reduced. Left Atrium: Left atrial size was mildly dilated. Right Atrium: Right atrial size was moderately dilated. Pericardium: A small pericardial effusion  is present. Mitral Valve: The mitral valve is normal in structure. Mild mitral valve regurgitation. Tricuspid Valve: The tricuspid valve is normal in structure. Tricuspid valve regurgitation is severe. Aortic Valve: The aortic valve is normal in structure. Aortic valve regurgitation is mild. Aortic valve mean gradient measures 3.0 mmHg. Aortic valve peak gradient measures 4.4 mmHg. Aortic valve area, by VTI measures 2.65 cm. Pulmonic Valve: The pulmonic valve was normal in structure. Pulmonic valve regurgitation is not visualized. Aorta: The aortic root is normal in size and structure and aortic dilatation noted. There is borderline dilatation of the ascending aorta, measuring 39 mm. Venous: The inferior vena cava is dilated in size with less than 50% respiratory variability, suggesting right atrial pressure of 15 mmHg. IAS/Shunts: No atrial level shunt detected by color flow Doppler.  LEFT VENTRICLE PLAX 2D LVIDd:         4.80 cm LVIDs:         4.20 cm LV PW:         1.40 cm LV IVS:        1.30 cm LVOT diam:     1.80 cm LV SV:         39 LV SV Index:   19 LVOT Area:     2.54 cm  RIGHT VENTRICLE RV Basal diam:  4.90 cm RV Mid diam:    4.50 cm RV S prime:     12.20 cm/s TAPSE (M-mode): 1.0 cm LEFT ATRIUM             Index        RIGHT ATRIUM           Index LA Vol (A2C):   42.0 ml 20.39 ml/m  RA Area:     21.50 cm LA Vol (A4C):   76.7 ml 37.24 ml/m  RA Volume:   65.30 ml  31.70 ml/m LA Biplane Vol: 60.8 ml 29.52  ml/m  AORTIC VALVE AV Area (Vmax):    2.45 cm AV Area (Vmean):   2.47 cm AV Area (VTI):     2.65 cm AV Vmax:           105.00 cm/s AV Vmean:          77.700 cm/s AV VTI:            0.149 m AV Peak Grad:      4.4 mmHg AV Mean Grad:      3.0 mmHg LVOT Vmax:         101.00 cm/s LVOT Vmean:        75.400 cm/s LVOT VTI:          0.155 m LVOT/AV VTI ratio: 1.04  AORTA Ao Root diam: 3.20 cm Ao Asc diam:  3.90 cm MITRAL VALVE MV Area (PHT): 4.77 cm     SHUNTS MV Decel Time: 159 msec     Systemic VTI:  0.16 m MV E velocity: 111.00 cm/s  Systemic Diam: 1.80 cm Aditya Sabharwal Electronically signed by Ria Commander Signature Date/Time: 07/26/2023/11:39:08 AM    Final    DG Chest Portable 1 View Result Date: 07/25/2023 CLINICAL DATA:  Cough, shortness of breath. EXAM: PORTABLE CHEST 1 VIEW COMPARISON:  October 24, 2020. FINDINGS: Stable cardiomegaly. Small left pleural effusion is noted with left basilar atelectasis. Right lung is clear. Bony thorax is unremarkable. IMPRESSION: Small left pleural effusion with left basilar subsegmental atelectasis. Electronically Signed   By: Lynwood Landy Raddle M.D.   On: 07/25/2023 14:07  LOS: 1 day   Time spent= 35 mins    Burgess JAYSON Dare, MD [facility]  If 7PM-7AM, please contact night-coverage  07/27/2023, 9:03 AM

## 2023-07-27 NOTE — Anesthesia Preprocedure Evaluation (Signed)
 Anesthesia Evaluation  Patient identified by MRN, date of birth, ID band Patient awake    Reviewed: Allergy & Precautions, H&P , NPO status , Patient's Chart, lab work & pertinent test results  Airway Mallampati: II   Neck ROM: full    Dental  (+) Dental Advisory Given   Pulmonary neg pulmonary ROS   breath sounds clear to auscultation       Cardiovascular hypertension, +CHF  + dysrhythmias Atrial Fibrillation  Rhythm:regular Rate:Normal     Neuro/Psych    GI/Hepatic ,GERD  ,,  Endo/Other  diabetes    Renal/GU stones     Musculoskeletal  (+) Arthritis ,    Abdominal   Peds  Hematology  (+) Blood dyscrasia, anemia   Anesthesia Other Findings   Reproductive/Obstetrics                             Anesthesia Physical Anesthesia Plan  ASA: 4  Anesthesia Plan: MAC   Post-op Pain Management: Minimal or no pain anticipated   Induction: Intravenous  PONV Risk Score and Plan: 1 and Propofol  infusion and Treatment may vary due to age or medical condition  Airway Management Planned: Nasal Cannula  Additional Equipment:   Intra-op Plan:   Post-operative Plan:   Informed Consent: I have reviewed the patients History and Physical, chart, labs and discussed the procedure including the risks, benefits and alternatives for the proposed anesthesia with the patient or authorized representative who has indicated his/her understanding and acceptance.     Dental advisory given  Plan Discussed with: Anesthesiologist, CRNA and Surgeon  Anesthesia Plan Comments:         Anesthesia Quick Evaluation

## 2023-07-27 NOTE — H&P (View-Only) (Signed)
 Progress Note  Patient Name: Joseph [name] Date of Encounter: 07/27/2023 [facility] Cardiologist: Lynwood Schilling, MD   Interval Summary   No changes from yesterday, continues to be in atrial fibrillation.  Plan for TEE/DCCV.  Family is accompanied with him and.  Got his Eliquis  dose today. Vital Signs Vitals:   07/26/23 1943 07/26/23 2329 07/27/23 0343 07/27/23 0827  BP: 96/69 (!) 116/90 138/84 115/78  Pulse: (!) 127 (!) 121 (!) 135 (!) 110  Resp: 16 20 18 16   Temp: 97.9 F (36.6 C) 98.4 F (36.9 C) 98.4 F (36.9 C) 98.9 F (37.2 C)  TempSrc: Oral Oral Oral Oral  SpO2: 97% 95% 95% 95%  Weight:   80.8 kg   Height:        Intake/Output Summary (Last 24 hours) at 07/27/2023 0926 Last data filed at 07/27/2023 0355 Gross per 24 hour  Intake 480 ml  Output --  Net 480 ml      07/27/2023    3:43 AM 07/26/2023    5:00 AM 07/25/2023    9:33 PM  Last 3 Weights  Weight (lbs) 178 lb 3.2 oz 180 lb 8 oz 184 lb  Weight (kg) 80.831 kg 81.874 kg 83.462 kg      Telemetry/ECG  Atrial fibrillation heart rates between 110-130.- Personally Reviewed  Physical Exam  GEN: No acute distress.   Neck: No JVD Cardiac: Irregularly irregular, tachycardic Respiratory: Clear to auscultation bilaterally. GI: Soft, nontender, non-distended  MS: No edema  Patient Profile Patient with past medical history significant for myasthenia gravis, lung cancer status post lobectomy, type 2 diabetes, hypertension, prostate cancer.  Patient seen at neurology office noted irregular rhythm.  Sent to PCPs office who confirmed atrial fibrillation.  Sent to the emergency room.  Assessment & Plan   New onset A-fib RVR Unknown duration, he is asymptomatic.  Heart rates been controlled 120 to 140 mg, soft BP. IV dilt stopped given soft BP and MG.  Fortunately appears to be euvolemic at this time.  Plans for TEE/DCCV today.  Reviewed with neurology, and GI, and performing TEE MD.  Genna to continue.    Continue Eliquis  5 mg twice daily.  Need to monitor renal function. If Cr>1.5 he will need reduced dosing for age and renal function.  With his myasthenia's gravis there are relative contraindications to CCB's and beta-blockers (but was tolerating BB on this and tolerating before).   Currently on IV amiodarone .    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  Mildly reduced RV function.  Small pericardial effusion, mild MR, severe TR.  Aortic dilatation 39 mm.  RA pressure 15 but appears euvolemic and asymptomatic. Will need to repeat echocardiogram post DCCV As this is likely tachycardia mediated/advanced age, would not be too aggressive with GDMT  Myasthenia gravis Has undergone treatment for this and has had significant improvement in symptoms.  Follows with neurology.  Anemia Monitor this closely, felt to be dilutional by primary team.  Hypertension BP still soft. Toprol -XL stopped with history of MG.  Home hydralazine  and lisinopril  held.  For questions or updates, please contact [facility] Please consult [email or web address] for contact info under       Signed, Thom LITTIE Sluder, PA-C     History and all data above reviewed.  Patient examined.  I agree with the findings as above.  No pain.  No SOB.   Was nauseated.  The patient exam reveals RNM:Pmmzhlojm   ,  Lungs: Few basilar crackles  ,  Abd: Positive bowel sounds, no rebound no guarding, Ext No edema  .  All available labs, radiology testing, previous records reviewed. Agree with documented assessment and plan. Atrial fib:  TEE/DCCV today.  Given the potential exacerbation of MG with amio I will stop this. I am going to change to Cozaar in anticipation of changing to Entresto as an out patient.      [name]  11:55 AM  07/27/2023

## 2023-07-27 NOTE — CV Procedure (Signed)
   TRANSESOPHAGEAL ECHOCARDIOGRAM GUIDED DIRECT CURRENT CARDIOVERSION  NAME:  Joseph [name]   MRN: [id number] DOB:  Jun 03, 1931   ADMIT DATE: 07/25/2023  INDICATIONS: Symptomatic atrial fibrillation  PROCEDURE:   Informed consent was obtained prior to the procedure. The risks, benefits and alternatives for the procedure were discussed and the patient comprehended these risks.  Risks include, but are not limited to, cough, sore throat, vomiting, nausea, somnolence, esophageal and stomach trauma or perforation, bleeding, low blood pressure, aspiration, pneumonia, infection, trauma to the teeth and death.    After a procedural time-out, the oropharynx was anesthetized and the patient was sedated by the anesthesia service. The transesophageal probe was inserted in the esophagus and stomach without difficulty and multiple views were obtained. Anesthesia was monitored by Catherene Schmitz, CRNA.   COMPLICATIONS:    Complications: No complications Patient tolerated procedure well.  FINDINGS:  No LAA thrombus   CARDIOVERSION:     Indications:  Symptomatic Atrial Fibrillation  Procedure Details:  Once the TEE was complete, the patient had the defibrillator pads placed in the anterior and posterior position. Once an appropriate level of sedation was confirmed, the patient was cardioverted x 1 with 200J of biphasic synchronized energy.  The patient converted to NSR with frequent PACs and runs of likely atrial tachycardia.  There were no apparent complications.  The patient had normal neuro status and respiratory status post procedure with vitals stable as recorded elsewhere.  Adequate airway was maintained throughout and vital signs monitored per protocol.  Lonni Nanas MD [facility]  [address] [place], [place] [address] [phone]   1:54 PM

## 2023-07-27 NOTE — Progress Notes (Addendum)
 Progress Note  Patient Name: Joseph [name] Date of Encounter: 07/27/2023 [facility] Cardiologist: Lynwood Schilling, MD   Interval Summary   No changes from yesterday, continues to be in atrial fibrillation.  Plan for TEE/DCCV.  Family is accompanied with him and.  Got his Eliquis  dose today. Vital Signs Vitals:   07/26/23 1943 07/26/23 2329 07/27/23 0343 07/27/23 0827  BP: 96/69 (!) 116/90 138/84 115/78  Pulse: (!) 127 (!) 121 (!) 135 (!) 110  Resp: 16 20 18 16   Temp: 97.9 F (36.6 C) 98.4 F (36.9 C) 98.4 F (36.9 C) 98.9 F (37.2 C)  TempSrc: Oral Oral Oral Oral  SpO2: 97% 95% 95% 95%  Weight:   80.8 kg   Height:        Intake/Output Summary (Last 24 hours) at 07/27/2023 0926 Last data filed at 07/27/2023 0355 Gross per 24 hour  Intake 480 ml  Output --  Net 480 ml      07/27/2023    3:43 AM 07/26/2023    5:00 AM 07/25/2023    9:33 PM  Last 3 Weights  Weight (lbs) 178 lb 3.2 oz 180 lb 8 oz 184 lb  Weight (kg) 80.831 kg 81.874 kg 83.462 kg      Telemetry/ECG  Atrial fibrillation heart rates between 110-130.- Personally Reviewed  Physical Exam  GEN: No acute distress.   Neck: No JVD Cardiac: Irregularly irregular, tachycardic Respiratory: Clear to auscultation bilaterally. GI: Soft, nontender, non-distended  MS: No edema  Patient Profile Patient with past medical history significant for myasthenia gravis, lung cancer status post lobectomy, type 2 diabetes, hypertension, prostate cancer.  Patient seen at neurology office noted irregular rhythm.  Sent to PCPs office who confirmed atrial fibrillation.  Sent to the emergency room.  Assessment & Plan   New onset A-fib RVR Unknown duration, he is asymptomatic.  Heart rates been controlled 120 to 140 mg, soft BP. IV dilt stopped given soft BP and MG.  Fortunately appears to be euvolemic at this time.  Plans for TEE/DCCV today.  Reviewed with neurology, and GI, and performing TEE MD.  Genna to continue.    Continue Eliquis  5 mg twice daily.  Need to monitor renal function. If Cr>1.5 he will need reduced dosing for age and renal function.  With his myasthenia's gravis there are relative contraindications to CCB's and beta-blockers (but was tolerating BB on this and tolerating before).   Currently on IV amiodarone .    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  Mildly reduced RV function.  Small pericardial effusion, mild MR, severe TR.  Aortic dilatation 39 mm.  RA pressure 15 but appears euvolemic and asymptomatic. Will need to repeat echocardiogram post DCCV As this is likely tachycardia mediated/advanced age, would not be too aggressive with GDMT  Myasthenia gravis Has undergone treatment for this and has had significant improvement in symptoms.  Follows with neurology.  Anemia Monitor this closely, felt to be dilutional by primary team.  Hypertension BP still soft. Toprol -XL stopped with history of MG.  Home hydralazine  and lisinopril  held.  For questions or updates, please contact [facility] Please consult [email or web address] for contact info under       Signed, Thom LITTIE Sluder, PA-C     History and all data above reviewed.  Patient examined.  I agree with the findings as above.  No pain.  No SOB.   Was nauseated.  The patient exam reveals RNM:Pmmzhlojm   ,  Lungs: Few basilar crackles  ,  Abd: Positive bowel sounds, no rebound no guarding, Ext No edema  .  All available labs, radiology testing, previous records reviewed. Agree with documented assessment and plan. Atrial fib:  TEE/DCCV today.  Given the potential exacerbation of MG with amio I will stop this. I am going to change to Cozaar in anticipation of changing to Entresto as an out patient.      [name]  11:55 AM  07/27/2023

## 2023-07-27 NOTE — Plan of Care (Signed)
  Problem: Pain Managment: Goal: General experience of comfort will improve and/or be controlled Outcome: Progressing   Problem: Elimination: Goal: Will not experience complications related to bowel motility Outcome: Progressing   Problem: Safety: Goal: Ability to remain free from injury will improve Outcome: Progressing   Problem: Skin Integrity: Goal: Risk for impaired skin integrity will decrease Outcome: Progressing

## 2023-07-27 NOTE — Interval H&P Note (Signed)
 History and Physical Interval Note:  07/27/2023 1:53 PM  Joseph [name]  has presented today for surgery, with the diagnosis of afib.  The various methods of treatment have been discussed with the patient and family. After consideration of risks, benefits and other options for treatment, the patient has consented to  Procedure(s): TRANSESOPHAGEAL ECHOCARDIOGRAM (N/A) CARDIOVERSION (N/A) as a surgical intervention.  The patient's history has been reviewed, patient examined, no change in status, stable for surgery.  I have reviewed the patient's chart and labs.  Questions were answered to the patient's satisfaction.     Lonni LITTIE Nanas

## 2023-07-28 DIAGNOSIS — I4891 Unspecified atrial fibrillation: Secondary | ICD-10-CM

## 2023-07-28 DIAGNOSIS — I5021 Acute systolic (congestive) heart failure: Secondary | ICD-10-CM | POA: Diagnosis not present

## 2023-07-28 LAB — BASIC METABOLIC PANEL WITH GFR
Anion gap: 8 (ref 5–15)
BUN: 21 mg/dL (ref 8–23)
CO2: 22 mmol/L (ref 22–32)
Calcium: 9.1 mg/dL (ref 8.9–10.3)
Chloride: 111 mmol/L (ref 98–111)
Creatinine, Ser: 1.37 mg/dL — ABNORMAL HIGH (ref 0.61–1.24)
GFR, Estimated: 49 mL/min — ABNORMAL LOW (ref >60)
Glucose, Bld: 126 mg/dL — ABNORMAL HIGH (ref 70–99)
Potassium: 5 mmol/L (ref 3.5–5.1)
Sodium: 141 mmol/L (ref 135–145)

## 2023-07-28 LAB — MAGNESIUM: Magnesium: 1.9 mg/dL (ref 1.7–2.4)

## 2023-07-28 LAB — GLUCOSE, CAPILLARY
Glucose-Capillary: 163 mg/dL — ABNORMAL HIGH (ref 70–99)
Glucose-Capillary: 130 mg/dL — ABNORMAL HIGH (ref 70–99)
Glucose-Capillary: 155 mg/dL — ABNORMAL HIGH (ref 70–99)
Glucose-Capillary: 125 mg/dL — ABNORMAL HIGH (ref 70–99)

## 2023-07-28 MED ORDER — AMIODARONE HCL IN DEXTROSE 360-4.14 MG/200ML-% IV SOLN
60.0000 mg/h | INTRAVENOUS | Status: DC
  Administered 2023-07-28 (×2): 60 mg/h via INTRAVENOUS
  Filled 2023-07-28 (×2): qty 200

## 2023-07-28 MED ORDER — AMIODARONE HCL IN DEXTROSE 360-4.14 MG/200ML-% IV SOLN
30.0000 mg/h | INTRAVENOUS | Status: DC
  Administered 2023-07-28 – 2023-07-29 (×2): 30 mg/h via INTRAVENOUS
  Filled 2023-07-28: qty 200

## 2023-07-28 MED ORDER — AMIODARONE LOAD VIA INFUSION
150.0000 mg | Freq: Once | INTRAVENOUS | Status: AC
  Administered 2023-07-28: 150 mg via INTRAVENOUS
  Filled 2023-07-28: qty 83.34

## 2023-07-28 MED ORDER — IPRATROPIUM BROMIDE 0.06 % NA SOLN
2.0000 | Freq: Three times a day (TID) | NASAL | Status: DC
  Administered 2023-07-28 – 2023-07-29 (×2): 2 via NASAL
  Filled 2023-07-28 (×2): qty 15

## 2023-07-28 NOTE — Plan of Care (Signed)
  Problem: Clinical Measurements: Goal: Ability to maintain clinical measurements within normal limits will improve Outcome: Progressing Goal: Will remain free from infection Outcome: Progressing Goal: Diagnostic test results will improve Outcome: Progressing Goal: Respiratory complications will improve Outcome: Progressing Goal: Cardiovascular complication will be avoided Outcome: Progressing   Problem: Tissue Perfusion: Goal: Adequacy of tissue perfusion will improve Outcome: Progressing

## 2023-07-28 NOTE — Plan of Care (Signed)
  Problem: Activity: Goal: Risk for activity intolerance will decrease Outcome: Progressing   Problem: Nutrition: Goal: Adequate nutrition will be maintained Outcome: Progressing   Problem: Elimination: Goal: Will not experience complications related to bowel motility Outcome: Progressing   Problem: Pain Managment: Goal: General experience of comfort will improve and/or be controlled Outcome: Progressing   Problem: Skin Integrity: Goal: Risk for impaired skin integrity will decrease Outcome: Progressing   Problem: Skin Integrity: Goal: Risk for impaired skin integrity will decrease Outcome: Progressing

## 2023-07-28 NOTE — Progress Notes (Signed)
 PROGRESS NOTE    [name]  FMW:[id number] DOB: May 18, 1931 DOA: 07/25/2023 PCP: Auston Opal, DO   Brief Narrative:  This 89 yrs old male with PMH significant for GERD, osteoarthritis, diet-controlled DM2, HTN, history of prostate cancer, lung cancer status post lobectomy, myasthenia gravis on Vyvgart /Mestinon /prednisone  sent to the hospital for tachycardia.In ED he was started on Cardizem  drip which was later changed to amiodarone . Esophagram does show some achalasia patient underwent successful TEE/cardioversion to normal sinus rhythm 6/27.  Cardiology following.   Assessment & Plan:   Principal Problem:   Atrial fibrillation with RVR (HCC) Active Problems:   Diet-controlled type 2 diabetes mellitus (HCC)   Myasthenia gravis without exacerbation (HCC)   Atrial fibrillation with RVR: New onset. TSH is normal.  Echocardiogram shows reduced EF of 40%.   Cardiology started patient on amiodarone  drip but still remains in A-fib with RVR. Esophagram does show some achalasia which can be followed up outpatient.   Patient underwent successful TEE/cardioversion to NSR 07/27/2023. Overnight patient went back to A-fib again with RVR. Cardiology has restarted amiodarone  infusion to control his rate and for rhythm control. He has myasthenia gravis which limits him beta-blockers. Continue Eliquis  5 mg twice daily. If he does not convert back to rhythm,  Cardiology planning outpatient cardioversion in several weeks. Discussed with Dr. Tobie  outpatient neurology-no contraindications to propofol /fentanyl  for sedation.  Should avoid any paralytics   Myasthenia gravis without exacerbation Follows outpatient neurology. On Vyvgart , Mestinon  and prednisone .   Will continue prednisone  and Mestinon  in the hospital.   Essential hypertension Continue IV hydralazine  as needed.   Diet controlled diabetes mellitus type 2 Supportive care   History of prostate cancer History of lung cancer status  post resection In remission.  Follow-up outpatient oncology.   DVT prophylaxis: Eliquis  Code Status: Full code Family Communication: Family at bed side. Disposition Plan:   Status is: Inpatient Remains inpatient appropriate because: Admitted for A-fib with RVR underwent TEE/cardioversion to NSR.  Patient went back to A-fib again now requiring IV amiodarone .   Consultants:  Cardiology  Procedures: TEE/ Cardioversion  Antimicrobials:  Anti-infectives (From admission, onward)    None      Subjective: Patient was seen and examined at bedside.  Overnight events noted. Patient denies any symptoms.  He again went back to A-fib with RVR.  Objective: Vitals:   07/27/23 2333 07/28/23 0506 07/28/23 0800 07/28/23 1200  BP: 122/80 132/82 (!) 130/98 (!) 147/98  Pulse: (!) 101 (!) 107 (!) 106 (!) 112  Resp: 18 18 16 18   Temp: 97.6 F (36.4 C) 99.2 F (37.3 C) 97.6 F (36.4 C)   TempSrc: Oral Oral Oral   SpO2: 100% 96% 99% 97%  Weight:  81.1 kg    Height:        Intake/Output Summary (Last 24 hours) at 07/28/2023 1251 Last data filed at 07/28/2023 0800 Gross per 24 hour  Intake 1407.56 ml  Output --  Net 1407.56 ml   Filed Weights   07/26/23 0500 07/27/23 0343 07/28/23 0506  Weight: 81.9 kg 80.8 kg 81.1 kg    Examination:  General exam: Appears calm and comfortable, not in any acute distress. Respiratory system: CTA Bilaterally. Respiratory effort normal.  RR 16 Cardiovascular system: S1 & S2 heard, RRR. No JVD, murmurs, rubs, gallops or clicks. Gastrointestinal system: Abdomen is non distended, soft and non tender.  Normal bowel sounds heard. Central nervous system: Alert and oriented x 3. No focal neurological deficits. Extremities: No edema, no  cyanosis, no clubbing Skin: No rashes, lesions or ulcers Psychiatry: Judgement and insight appear normal. Mood & affect appropriate.     Data Reviewed: I have personally reviewed following labs and imaging  studies  CBC: Recent Labs  Lab 07/25/23 1305 07/26/23 0515 07/27/23 0417  WBC 7.6 6.1 7.4  NEUTROABS 6.4  --   --   HGB 11.7* 10.2* 11.5*  HCT 37.2* 33.0* 36.1*  MCV 85.7 84.4 84.9  PLT 177 155 152   Basic Metabolic Panel: Recent Labs  Lab 07/25/23 1305 07/26/23 0515 07/27/23 0417 07/28/23 0434  NA 140 135 141 141  K 4.3 4.4 3.7 5.0  CL 105 101 105 111  CO2 22 23 23 22   GLUCOSE 143* 308* 145* 126*  BUN 18 20 20 21   CREATININE 1.31* 1.41* 1.33* 1.37*  CALCIUM 9.8 8.6* 9.3 9.1  MG  --  2.0 2.0 1.9  PHOS  --  3.1  --   --    GFR: Estimated Creatinine Clearance: 40.5 mL/min (A) (by C-G formula based on SCr of 1.37 mg/dL (H)). Liver Function Tests: No results for input(s): AST, ALT, ALKPHOS, BILITOT, PROT, ALBUMIN in the last 168 hours. No results for input(s): LIPASE, AMYLASE in the last 168 hours. No results for input(s): AMMONIA in the last 168 hours. Coagulation Profile: No results for input(s): INR, PROTIME in the last 168 hours. Cardiac Enzymes: No results for input(s): CKTOTAL, CKMB, CKMBINDEX, TROPONINI in the last 168 hours. BNP (last 3 results) No results for input(s): PROBNP in the last 8760 hours. HbA1C: Recent Labs    07/26/23 0513  HGBA1C 6.9*   CBG: Recent Labs  Lab 07/27/23 1222 07/27/23 1813 07/27/23 2155 07/28/23 0808 07/28/23 1145  GLUCAP 146* 156* 126* 163* 130*   Lipid Profile: No results for input(s): CHOL, HDL, LDLCALC, TRIG, CHOLHDL, LDLDIRECT in the last 72 hours. Thyroid Function Tests: Recent Labs    07/25/23 1330  TSH 1.483   Anemia Panel: No results for input(s): VITAMINB12, FOLATE, FERRITIN, TIBC, IRON, RETICCTPCT in the last 72 hours. Sepsis Labs: No results for input(s): PROCALCITON, LATICACIDVEN in the last 168 hours.  No results found for this or any previous visit (from the past 240 hours).   Radiology Studies: ECHO TEE Result Date: 07/27/2023     TRANSESOPHOGEAL ECHO REPORT   Patient Name:   Joseph [name] Date of Exam: 07/27/2023 Medical Rec #:  [id number]        Height:       73.0 in Accession #:    [id number]       Weight:       178.2 lb Date of Birth:  [birth year]        BSA:          2.049 m Patient Age:    89 years         BP:           129/79 mmHg Patient Gender: M                HR:           105 bpm. Exam Location:  Inpatient Procedure: Transesophageal Echo, Color Doppler and Cardiac Doppler (Both            Spectral and Color Flow Doppler were utilized during procedure). Indications:     I48.91* Unspecified atrial fibrillation  History:         Patient has prior history of Echocardiogram examinations, most  recent 07/26/2023. Arrythmias:Atrial Fibrillation; Risk                  Factors:Diabetes and Hypertension.  Sonographer:     Damien Senior RDCS Referring Phys:  [id number] THOM LITTIE SLUDER Diagnosing Phys: Lonni Nanas MD PROCEDURE: After discussion of the risks and benefits of a TEE, an informed consent was obtained from the patient. The transesophogeal probe was passed without difficulty through the esophogus of the patient. Sedation performed by different physician. The patient was monitored while under deep sedation. Anesthestetic sedation was provided intravenously by Anesthesiology: 34mg  of Propofol . The patient developed no complications during the procedure. A successful direct current cardioversion was performed at 200 joules with 1 attempt. Transgastric views not obtained: esophageal stricture.  IMPRESSIONS  1. Left ventricular ejection fraction, by estimation, is 35 to 40%. The left ventricle has moderately decreased function.  2. Right ventricular systolic function is mildly reduced. The right ventricular size is normal.  3. Left atrial size was moderately dilated. No left atrial/left atrial appendage thrombus was detected.  4. Right atrial size was mildly dilated.  5. The mitral valve is normal in structure. Mild  mitral valve regurgitation.  6. The aortic valve is tricuspid. Aortic valve regurgitation is mild.  7. Aortic dilatation noted. There is dilatation of the aortic root, measuring 41 mm.  8. Transgastric views not obtained. Given patient's history of esophageal stricture in distal esophagus/GE junction, only midesophageal views were obtained Conclusion(s)/Recommendation(s): No LA/LAA thrombus identified. Successful cardioversion performed with restoration of normal sinus rhythm. FINDINGS  Left Ventricle: Left ventricular ejection fraction, by estimation, is 35 to 40%. The left ventricle has moderately decreased function. The left ventricular internal cavity size was normal in size. Right Ventricle: The right ventricular size is normal. No increase in right ventricular wall thickness. Right ventricular systolic function is mildly reduced. Left Atrium: Left atrial size was moderately dilated. No left atrial/left atrial appendage thrombus was detected. Right Atrium: Right atrial size was mildly dilated. Pericardium: There is no evidence of pericardial effusion. Mitral Valve: The mitral valve is normal in structure. Mild mitral valve regurgitation. Tricuspid Valve: The tricuspid valve is normal in structure. Tricuspid valve regurgitation is mild. Aortic Valve: The aortic valve is tricuspid. Aortic valve regurgitation is mild. Pulmonic Valve: The pulmonic valve was grossly normal. Pulmonic valve regurgitation is trivial. Aorta: Aortic dilatation noted. There is dilatation of the aortic root, measuring 41 mm. IAS/Shunts: No atrial level shunt detected by color flow Doppler. Lonni Nanas MD Electronically signed by Lonni Nanas MD Signature Date/Time: 07/27/2023/5:32:11 PM    Final    EP STUDY Result Date: 07/27/2023 See surgical note for result.  DG ESOPHAGUS W SINGLE CM (SOL OR THIN BA) Result Date: 07/26/2023 CLINICAL DATA:  Provided history: Esophageal dysmotility. Additional history: History of  achalasia. Prior lower esophageal sphincter botox, Heller myotomy/esophageal dilation and fundoplication. Plan for possible transesophageal echocardiogram. EXAM: ESOPHAGUS/BARIUM SWALLOW/TABLET STUDY TECHNIQUE: A single contrast examination was performed using thin liquid barium. Additionally, the patient swallowed a 13 mm barium tablet under fluoroscopy. The exam was performed by [place], NP, and was supervised and interpreted by Dr. Rockey golden. FLUOROSCOPY: Radiation Exposure Index (as provided by the fluoroscopic device): 44.40 mGy Kerma COMPARISON:  Chest CT 09/14/2022. Prior esophagrams 09/13/2022 and earlier. FINDINGS: Postoperative changes from prior fundoplication. Narrowed appearance of the distal esophagus/GE junction. Moderately patulous esophagus elsewhere. A swallowed 13 mm barium tablet did not pass beyond the level of the distal esophagus/GE junction despite a prolonged period  of observation, and despite the patient taking additional swallows of water  and liquid barium contrast. Moderate esophageal dysmotility with tertiary contractions. Delayed esophageal contrast transit. No gastroesophageal reflux observed. Prior ACDF. IMPRESSION: 1. Similar to the prior esophagram of 09/13/2022, there is a narrowed appearance of the distal esophagus/gastroesophageal junction. While this is at least partly related to prior fundoplication, recurrent achalasia and stricture are also considerations. A swallowed 13 mm barium tablet did not pass beyond this point despite a prolonged period of observation, and despite the patient taking additional swallows of water  and liquid barium contrast. 2. The esophagus is moderately patulous elsewhere. 3. Moderate esophageal dysmotility with tertiary contractions. 4. Delayed esophageal contrast transit. Electronically Signed   By: Rockey Childs D.O.   On: 07/26/2023 16:22   Scheduled Meds:  amiodarone   150 mg Intravenous Once   apixaban   5 mg Oral BID   insulin   aspart  0-9 Units Subcutaneous TID WC   ipratropium  2 spray Each Nare TID   losartan  25 mg Oral Daily   pantoprazole   40 mg Oral BID   predniSONE   5 mg Oral Q breakfast   pyridostigmine   60 mg Oral TID   Continuous Infusions:  amiodarone      Followed by   amiodarone        LOS: 2 days    Time spent: 50 mins    Darcel Dawley, MD [facility]   If 7PM-7AM, please contact night-coverage

## 2023-07-28 NOTE — Plan of Care (Signed)
  Problem: Activity: Goal: Risk for activity intolerance will decrease 07/28/2023 1826 by Tonna Erla RAMAN, RN Outcome: Progressing 07/28/2023 1425 by Tonna Erla RAMAN, RN Outcome: Progressing   Problem: Nutrition: Goal: Adequate nutrition will be maintained 07/28/2023 1826 by Tonna Erla RAMAN, RN Outcome: Progressing 07/28/2023 1425 by Tonna Erla RAMAN, RN Outcome: Progressing   Problem: Safety: Goal: Ability to remain free from injury will improve 07/28/2023 1826 by Tonna Erla RAMAN, RN Outcome: Progressing 07/28/2023 1425 by Tonna Erla RAMAN, RN Outcome: Progressing

## 2023-07-28 NOTE — Progress Notes (Signed)
 Cardiology Progress Note  Patient ID: Joseph [name] MRN: [id number] DOB: 1931/10/01 Date of Encounter: 07/28/2023 Primary Cardiologist: Lynwood Schilling, MD  Subjective   Chief Complaint: Afib  HPI: Back in atrial fibrillation.  Reports minimal symptoms heart rate 100 to 140 bpm  ROS:  All other ROS reviewed and negative. Pertinent positives noted in the HPI.     Telemetry  Overnight telemetry shows A-fib heart rate 120 bpm, which I personally reviewed.   Physical Exam   Vitals:   07/27/23 2333 07/28/23 0506 07/28/23 0800 07/28/23 1200  BP: 122/80 132/82 (!) 130/98 (!) 147/98  Pulse: (!) 101 (!) 107 (!) 106 (!) 112  Resp: 18 18 16 18   Temp: 97.6 F (36.4 C) 99.2 F (37.3 C) 97.6 F (36.4 C)   TempSrc: Oral Oral Oral   SpO2: 100% 96% 99% 97%  Weight:  81.1 kg    Height:        Intake/Output Summary (Last 24 hours) at 07/28/2023 1228 Last data filed at 07/28/2023 0800 Gross per 24 hour  Intake 1407.56 ml  Output --  Net 1407.56 ml       07/28/2023    5:06 AM 07/27/2023    3:43 AM 07/26/2023    5:00 AM  Last 3 Weights  Weight (lbs) 178 lb 12.7 oz 178 lb 3.2 oz 180 lb 8 oz  Weight (kg) 81.1 kg 80.831 kg 81.874 kg    Body mass index is 23.59 kg/m.  General: Well nourished, well developed, in no acute distress Head: Atraumatic, normal size  Eyes: PEERLA, EOMI  Neck: Supple, no JVD Endocrine: No thryomegaly Cardiac: Normal S1, S2; irregular rhythm, no murmurs rubs or gallops Lungs: Clear to auscultation bilaterally, no wheezing, rhonchi or rales  Abd: Soft, nontender, no hepatomegaly  Ext: No edema, pulses 2+ Musculoskeletal: No deformities, BUE and BLE strength normal and equal Skin: Warm and dry, no rashes   Neuro: Alert and oriented to person, place, time, and situation, CNII-XII grossly intact, no focal deficits  Psych: Normal mood and affect   Cardiac Studies  TEE 07/27/2023  1. Left ventricular ejection fraction, by estimation, is 35 to 40%. The  left  ventricle has moderately decreased function.   2. Right ventricular systolic function is mildly reduced. The right  ventricular size is normal.   3. Left atrial size was moderately dilated. No left atrial/left atrial  appendage thrombus was detected.   4. Right atrial size was mildly dilated.   5. The mitral valve is normal in structure. Mild mitral valve  regurgitation.   6. The aortic valve is tricuspid. Aortic valve regurgitation is mild.   7. Aortic dilatation noted. There is dilatation of the aortic root,  measuring 41 mm.   8. Transgastric views not obtained. Given patient's history of esophageal  stricture in distal esophagus/GE junction, only midesophageal views were  obtained   Patient Profile  Joseph [name] is a 89 y.o. male with myasthenia gravis, lung cancer status post lobectomy, type 2 diabetes, hypertension, prostate cancer who was admitted on 07/25/2023 with A-fib with RVR.  Status post failed TEE/cardioversion.  Course complicated by acute systolic heart failure.  Assessment & Plan   # Atrial fibrillation with RVR - Underwent TEE/cardioversion on 07/27/2023.  Has had early recurrence of atrial fibrillation. - Complicated by acute systolic heart failure EF 35 to 40%. - He has myasthenia gravis which limits beta-blockers.  They are not entirely limited but could cause issues. - I would like to add  back IV amiodarone  to see if we can convert him back to rhythm. - After discussion with the patient, we will try amiodarone  to see if we can control his rate or getting back a rhythm with this.  If we need to consider beta-blockers I would like us  to discuss this with neurology.  I think we can try amiodarone  first and see how this goes. - Continue Eliquis  5 mg twice daily.  Appropriate dose based on age and kidney function. - Plan will be to observe on IV amiodarone .  May end up either rate controlling him or converting him back to rhythm.  Continue Eliquis .  If he does not  convert back to rhythm we can get his rate under control we will plan for outpatient cardioversion in several weeks.  Will see how he does today.  # Acute systolic heart failure, EF 35-40% - No signs of volume overload. - Beta-blockers are relatively contraindicated.  See discussion above. - Continue losartan 25 mg daily. - No signs of volume overload.  Can take Lasix as needed.     For questions or updates, please contact [facility] Please consult [email or web address] for contact info under        Signed, Darryle T. Barbaraann, MD, [facility] [place]  [facility]  07/28/2023 12:28 PM

## 2023-07-29 DIAGNOSIS — I4891 Unspecified atrial fibrillation: Secondary | ICD-10-CM | POA: Diagnosis not present

## 2023-07-29 DIAGNOSIS — I5021 Acute systolic (congestive) heart failure: Secondary | ICD-10-CM | POA: Diagnosis not present

## 2023-07-29 LAB — BASIC METABOLIC PANEL WITH GFR
Anion gap: 9 (ref 5–15)
BUN: 22 mg/dL (ref 8–23)
CO2: 24 mmol/L (ref 22–32)
Calcium: 9.3 mg/dL (ref 8.9–10.3)
Chloride: 107 mmol/L (ref 98–111)
Creatinine, Ser: 1.35 mg/dL — ABNORMAL HIGH (ref 0.61–1.24)
GFR, Estimated: 50 mL/min — ABNORMAL LOW (ref >60)
Glucose, Bld: 148 mg/dL — ABNORMAL HIGH (ref 70–99)
Potassium: 4.1 mmol/L (ref 3.5–5.1)
Sodium: 140 mmol/L (ref 135–145)

## 2023-07-29 LAB — CBC
HCT: 37.4 % — ABNORMAL LOW (ref 39.0–52.0)
Hemoglobin: 11.7 g/dL — ABNORMAL LOW (ref 13.0–17.0)
MCH: 26.1 pg (ref 26.0–34.0)
MCHC: 31.3 g/dL (ref 30.0–36.0)
MCV: 83.3 fL (ref 80.0–100.0)
Platelets: 146 10*3/uL — ABNORMAL LOW (ref 150–400)
RBC: 4.49 MIL/uL (ref 4.22–5.81)
RDW: 14.1 % (ref 11.5–15.5)
WBC: 8.2 10*3/uL (ref 4.0–10.5)
nRBC: 0 % (ref 0.0–0.2)

## 2023-07-29 LAB — PHOSPHORUS: Phosphorus: 3.4 mg/dL (ref 2.5–4.6)

## 2023-07-29 LAB — MAGNESIUM: Magnesium: 1.9 mg/dL (ref 1.7–2.4)

## 2023-07-29 LAB — GLUCOSE, CAPILLARY: Glucose-Capillary: 153 mg/dL — ABNORMAL HIGH (ref 70–99)

## 2023-07-29 MED ORDER — LOSARTAN POTASSIUM 25 MG PO TABS: 25.0000 mg | ORAL_TABLET | Freq: Every day | ORAL | 0 refills | Status: DC

## 2023-07-29 MED ORDER — AMIODARONE HCL 200 MG PO TABS
200.0000 mg | ORAL_TABLET | Freq: Two times a day (BID) | ORAL | 0 refills | Status: DC
Start: 2023-07-29 — End: 2023-08-14

## 2023-07-29 MED ORDER — AMIODARONE HCL 200 MG PO TABS
200.0000 mg | ORAL_TABLET | Freq: Two times a day (BID) | ORAL | Status: DC
  Administered 2023-07-29: 200 mg via ORAL
  Filled 2023-07-29: qty 1

## 2023-07-29 MED ORDER — SPIRONOLACTONE 25 MG PO TABS: 12.5000 mg | ORAL_TABLET | Freq: Every day | ORAL | 0 refills | Status: DC

## 2023-07-29 MED ORDER — EMPAGLIFLOZIN 10 MG PO TABS: 10.0000 mg | ORAL_TABLET | Freq: Every day | ORAL | 0 refills | Status: DC

## 2023-07-29 MED ORDER — SPIRONOLACTONE 12.5 MG HALF TABLET
12.5000 mg | ORAL_TABLET | Freq: Every day | ORAL | Status: DC
  Administered 2023-07-29: 12.5 mg via ORAL
  Filled 2023-07-29: qty 1

## 2023-07-29 MED ORDER — AMIODARONE HCL 200 MG PO TABS: 200.0000 mg | ORAL_TABLET | Freq: Every day | ORAL | Status: DC

## 2023-07-29 MED ORDER — APIXABAN 5 MG PO TABS: 5.0000 mg | ORAL_TABLET | Freq: Two times a day (BID) | ORAL | 0 refills | Status: DC

## 2023-07-29 MED ORDER — EMPAGLIFLOZIN 10 MG PO TABS
10.0000 mg | ORAL_TABLET | Freq: Every day | ORAL | Status: DC
  Administered 2023-07-29: 10 mg via ORAL
  Filled 2023-07-29: qty 1

## 2023-07-29 MED ORDER — GUAIFENESIN 100 MG/5ML PO LIQD: 5.0000 mL | ORAL | 0 refills | Status: AC | PRN

## 2023-07-29 MED ORDER — AMIODARONE HCL 200 MG PO TABS: 200.0000 mg | ORAL_TABLET | Freq: Two times a day (BID) | ORAL | Status: DC

## 2023-07-29 NOTE — Progress Notes (Signed)
 Cardiology Progress Note  Patient ID: Joseph [name] MRN: [id number] DOB: 11-09-1931 Date of Encounter: 07/29/2023 Primary Cardiologist: Lynwood Schilling, MD  Subjective   Chief Complaint: none.   HPI: Remains in A-fib.  Heart rates fairly controlled.  We discussed amiodarone  oral medication with consideration for repeat outpatient cardioversion.  Stable from a heart failure standpoint.  ROS:  All other ROS reviewed and negative. Pertinent positives noted in the HPI.     Telemetry  Overnight telemetry shows A-fib 90 to 120 bpm, which I personally reviewed.    Physical Exam   Vitals:   07/28/23 2354 07/29/23 0351 07/29/23 0512 07/29/23 0721  BP: 114/87 119/80  (!) 125/91  Pulse: 94 (!) 104 99 (!) 104  Resp: 17 17  16   Temp: 97.7 F (36.5 C) 97.8 F (36.6 C)  97.9 F (36.6 C)  TempSrc: Oral Oral  Oral  SpO2: 94% 100% 99% 100%  Weight:   81.1 kg   Height:        Intake/Output Summary (Last 24 hours) at 07/29/2023 0931 Last data filed at 07/28/2023 2010 Gross per 24 hour  Intake 600 ml  Output --  Net 600 ml       07/29/2023    5:12 AM 07/28/2023    5:06 AM 07/27/2023    3:43 AM  Last 3 Weights  Weight (lbs) 178 lb 12.7 oz 178 lb 12.7 oz 178 lb 3.2 oz  Weight (kg) 81.1 kg 81.1 kg 80.831 kg    Body mass index is 23.59 kg/m.  General: Well nourished, well developed, in no acute distress Head: Atraumatic, normal size  Eyes: PEERLA, EOMI  Neck: Supple, no JVD Endocrine: No thryomegaly Cardiac: Irregular rhythm, no murmurs Lungs: Clear to auscultation bilaterally, no wheezing, rhonchi or rales  Abd: Soft, nontender, no hepatomegaly  Ext: No edema, pulses 2+ Musculoskeletal: No deformities, BUE and BLE strength normal and equal Skin: Warm and dry, no rashes   Neuro: Alert and oriented to person, place, time, and situation, CNII-XII grossly intact, no focal deficits  Psych: Normal mood and affect   Cardiac Studies  TEE 07/27/2023  1. Left ventricular ejection  fraction, by estimation, is 35 to 40%. The  left ventricle has moderately decreased function.   2. Right ventricular systolic function is mildly reduced. The right  ventricular size is normal.   3. Left atrial size was moderately dilated. No left atrial/left atrial  appendage thrombus was detected.   4. Right atrial size was mildly dilated.   5. The mitral valve is normal in structure. Mild mitral valve  regurgitation.   6. The aortic valve is tricuspid. Aortic valve regurgitation is mild.   7. Aortic dilatation noted. There is dilatation of the aortic root,  measuring 41 mm.   8. Transgastric views not obtained. Given patient's history of esophageal  stricture in distal esophagus/GE junction, only midesophageal views were  obtained   Patient Profile  Joseph [name] is a 89 y.o. male with myasthenia gravis, hypertension, type 2 diabetes, prostate cancer admitted on 07/25/2023 with A-fib with RVR and new onset systolic heart failure.  Status post failed TEE/cardioversion.  Assessment & Plan   # A-fib with RVR - Has resulted in systolic dysfunction.  Underwent TEE/cardioversion but early recurrence of A-fib. - Course complicated by systolic dysfunction. - Limited options for rate control given myasthenia gravis.  Beta-blockers and calcium channel blockers are not ideal in the setting of myasthenia gravis.  Calcium channel blockers cannot be used in  systolic dysfunction. - I am a bit cautious with using digoxin in the setting of CKD stage IIIa in a 89 year old gentleman. - I think we are left with amiodarone  to attempt rate control and then consider outpatient cardioversion.  Stop amiodarone  drip.  Transition to 200 mg amiodarone  twice daily.  He can be discharged on this regimen.  We will arrange outpatient follow-up to consider reattempt at cardioversion once amiodarone  has been loaded in the system. - If he fails recurrent cardioversion on amiodarone  would need to be considered for AV  node ablation with pacemaker implantation.  Again we will see how he does. - Continue Eliquis  5 mg twice daily.  # Acute systolic heart failure, EF 35-40% - No signs of volume overload. - Beta-blockers are contraindicated in the setting of myasthenia gravis. - Would recommend to continue losartan 25 mg daily.  Hold his home hydralazine .  We are adding Aldactone 12.5 mg daily.  Would discharge him on this. - Add Jardiance 10 mg daily. - Can consider Sim transition as an outpatient. - We will arrange close follow-up. - lasix 20 mg PO PRN at DC  [facility] will sign off.   The patient is ready for discharge today from a cardiac standpoint. Medication Recommendations:  As above Other recommendations (labs, testing, etc):  none Follow up as an outpatient:  1-2 weeks with Dr. Lavona  For questions or updates, please contact [facility] Please consult [email or web address] for contact info under    Signed, Darryle T. Barbaraann, MD, [facility] [place]  [facility]  07/29/2023 9:31 AM

## 2023-07-29 NOTE — Plan of Care (Signed)
  Problem: Education: Goal: Knowledge of General Education information will improve Description: Including pain rating scale, medication(s)/side effects and non-pharmacologic comfort measures Outcome: Progressing   Problem: Health Behavior/Discharge Planning: Goal: Ability to manage health-related needs will improve Outcome: Progressing   Problem: Clinical Measurements: Goal: Ability to maintain clinical measurements within normal limits will improve Outcome: Progressing Goal: Will remain free from infection Outcome: Progressing Goal: Diagnostic test results will improve Outcome: Progressing Goal: Respiratory complications will improve Outcome: Progressing Goal: Cardiovascular complication will be avoided Outcome: Progressing   Problem: Activity: Goal: Risk for activity intolerance will decrease Outcome: Progressing   Problem: Nutrition: Goal: Adequate nutrition will be maintained Outcome: Progressing   Problem: Coping: Goal: Level of anxiety will decrease Outcome: Progressing   Problem: Elimination: Goal: Will not experience complications related to bowel motility Outcome: Progressing Goal: Will not experience complications related to urinary retention Outcome: Progressing   Problem: Pain Managment: Goal: General experience of comfort will improve and/or be controlled Outcome: Progressing   Problem: Safety: Goal: Ability to remain free from injury will improve Outcome: Progressing   Problem: Skin Integrity: Goal: Risk for impaired skin integrity will decrease Outcome: Progressing   Problem: Coping: Goal: Ability to adjust to condition or change in health will improve Outcome: Progressing   Problem: Health Behavior/Discharge Planning: Goal: Ability to identify and utilize available resources and services will improve Outcome: Progressing Goal: Ability to manage health-related needs will improve Outcome: Progressing

## 2023-07-29 NOTE — Discharge Summary (Signed)
 Physician Discharge Summary  Joseph [name] FMW:[id number] DOB: [birth year] DOA: 07/25/2023  PCP: Auston Opal, DO  Admit date: 07/25/2023  Discharge date: 07/29/2023  Admitted From: Home  Disposition:  Home  Recommendations for Outpatient Follow-up:  Follow up with PCP in 1-2 weeks. Please obtain BMP/CBC in one week. Advised to follow-up with Cardiology as scheduled for outpatient cardioversion. Advised to take amiodarone  200 mg twice daily for now. Advised to start losartan and  spironolactone. Advised to continue Eliquis  5 mg twice daily  Home Health:None Equipment/Devices:None  Discharge Condition: Stable. CODE STATUS:Full code Diet recommendation: Heart Healthy   Brief [facility] Course: This 89 yrs old male with PMH significant for GERD, osteoarthritis, diet-controlled DM2, HTN, history of prostate cancer, lung cancer status post lobectomy, myasthenia gravis on Vyvgart /Mestinon /prednisone  sent to the hospital for tachycardia. In ED he was started on Cardizem  drip which was later changed to amiodarone . Esophagram does show some achalasia. Patient underwent successful TEE/cardioversion to normal sinus rhythm 6/27.  Cardiology following. Post procedure Patient went into A-fib again.  Cardiologist started patient on amiodarone  infusion.  Patient continued to remain in A-fib but heart rate remains controlled.  So cardiology switched to amiodarone  200 mg twice daily for now until outpatient follow-up.  Patient feels better and wants to be discharged home.  Discharge Diagnoses:  Principal Problem:   Atrial fibrillation with RVR (HCC) Active Problems:   Diet-controlled type 2 diabetes mellitus (HCC)   Myasthenia gravis without exacerbation (HCC)  Atrial fibrillation with RVR: New onset. TSH is normal.  Echocardiogram shows reduced EF of 40%.   Cardiology started patient on amiodarone  drip but still remains in A-fib with RVR. Esophagram does show some achalasia which can be  followed up outpatient.   Patient underwent successful TEE/cardioversion to NSR 07/27/2023. Overnight patient went back to A-fib again with RVR. Cardiology has restarted amiodarone  infusion to control his rate and for rhythm control. He has myasthenia gravis which limits him beta-blockers. Continue Eliquis  5 mg twice daily. If he does not convert back to rhythm,  Cardiology planning outpatient cardioversion in several weeks. Discussed with Dr. Tobie  outpatient neurology-no contraindications to propofol /fentanyl  for sedation.  Should avoid any paralytics. Cardiology signed off recommended patient can be discharged on amiodarone  200 twice daily.   Myasthenia gravis without exacerbation Follows outpatient neurology. On Vyvgart , Mestinon  and prednisone .   Will continue prednisone  and Mestinon  in the hospital.   Essential hypertension Continued IV hydralazine  as needed.   Diet controlled diabetes mellitus type 2 Supportive care.   History of prostate cancer History of lung cancer status post resection In remission.  Follow-up outpatient oncology.    Discharge Instructions  Discharge Instructions     Call MD for:  difficulty breathing, headache or visual disturbances   Complete by: As directed    Call MD for:  persistant dizziness or light-headedness   Complete by: As directed    Call MD for:  persistant nausea and vomiting   Complete by: As directed    Diet - low sodium heart healthy   Complete by: As directed    Diet Carb Modified   Complete by: As directed    Discharge instructions   Complete by: As directed    Advised to follow-up with primary care physician in 1 week. Advised to follow-up with cardiology as scheduled for outpatient cardioversion. Advised to take amiodarone  200 mg twice daily for now. Advised to start losartan and his spironolactone. Advised to continue Eliquis  5 mg twice daily  Increase activity slowly   Complete by: As directed       Allergies as  of 07/29/2023       Reactions   No Known Allergies Other (See Comments)   Pt has no known allergies, but has Hx of Myasthenia Gravis        Medication List     STOP taking these medications    hydrALAZINE  25 MG tablet Commonly known as: APRESOLINE    lisinopril  40 MG tablet Commonly known as: ZESTRIL    VYVGART  HYTRULO [place]       TAKE these medications    acetaminophen  500 MG tablet Commonly known as: TYLENOL  Take 500-1,000 mg by mouth every 8 (eight) hours as needed for moderate pain or headache.   amiodarone  200 MG tablet Commonly known as: PACERONE  Take 1 tablet (200 mg total) by mouth 2 (two) times daily.   amoxicillin 500 MG capsule Commonly known as: AMOXIL Take 2,000 mg by mouth See admin instructions. Take 4 capsules (2000 mg) by mouth 1 hours prior to procedure.   apixaban  5 MG Tabs tablet Commonly known as: ELIQUIS  Take 1 tablet (5 mg total) by mouth 2 (two) times daily.   aspirin  EC 81 MG tablet Take 81 mg by mouth every Monday, Wednesday, and Friday.   CALCIUM PO Take 1 tablet by mouth daily.   ELDERBERRY PO Take 1 each by mouth daily. Elderberry gummy supplement   empagliflozin 10 MG Tabs tablet Commonly known as: JARDIANCE Take 1 tablet (10 mg total) by mouth daily. Start taking on: July 30, 2023   guaiFENesin  100 MG/5ML liquid Commonly known as: ROBITUSSIN Take 5 mLs by mouth every 4 (four) hours as needed for cough or to loosen phlegm.   ipratropium 0.06 % nasal spray Commonly known as: ATROVENT  Place 1 spray into both nostrils 3 (three) times daily.   loratadine  10 MG tablet Commonly known as: CLARITIN  Take 10 mg by mouth daily.   losartan 25 MG tablet Commonly known as: COZAAR Take 1 tablet (25 mg total) by mouth daily. Start taking on: July 30, 2023   meclizine  25 MG tablet Commonly known as: ANTIVERT  Take 25 mg by mouth 3 (three) times daily as needed for dizziness.   Multivitamin Men 50+ Tabs Take 1 tablet by mouth  daily.   ondansetron  4 MG tablet Commonly known as: ZOFRAN  Take 4 mg by mouth every 8 (eight) hours as needed for nausea or vomiting.   pantoprazole  40 MG tablet Commonly known as: PROTONIX  Take 1 tablet (40 mg total) by mouth 2 (two) times daily.   predniSONE  5 MG tablet Commonly known as: DELTASONE  Take 1 tablet (5 mg total) by mouth daily with breakfast.   pyridostigmine  60 MG tablet Commonly known as: MESTINON  TAKE 1 TABLET BY MOUTH 3 TIMES DAILY.   spironolactone 25 MG tablet Commonly known as: ALDACTONE Take 0.5 tablets (12.5 mg total) by mouth daily. Start taking on: July 30, 2023   Vitamin C Powd Take 1 packet by mouth daily.   VITAMIN D-3 PO Take 1 capsule by mouth daily.        Follow-up Information     Auston Opal, DO Follow up in 1 week(s).   Specialty: Family Medicine Contact information: [address] [place] [address] [phone]         Barbaraann Darryle Ned, MD Follow up in 1 week(s).   Specialties: Cardiology, Internal Medicine, Radiology Contact information: [address] [place] [address] [phone]  Allergies  Allergen Reactions   No Known Allergies Other (See Comments)    Pt has no known allergies, but has Hx of Myasthenia Gravis    Consultations: Cardiology   Procedures/Studies: ECHO TEE Result Date: 07/27/2023    TRANSESOPHOGEAL ECHO REPORT   Patient Name:   Joseph [name] Date of Exam: 07/27/2023 Medical Rec #:  [id number]        Height:       73.0 in Accession #:    [id number]       Weight:       178.2 lb Date of Birth:  [birth year]        BSA:          2.049 m Patient Age:    89 years         BP:           129/79 mmHg Patient Gender: M                HR:           105 bpm. Exam Location:  Inpatient Procedure: Transesophageal Echo, Color Doppler and Cardiac Doppler (Both            Spectral and Color Flow Doppler were utilized during procedure). Indications:     I48.91*  Unspecified atrial fibrillation  History:         Patient has prior history of Echocardiogram examinations, most                  recent 07/26/2023. Arrythmias:Atrial Fibrillation; Risk                  Factors:Diabetes and Hypertension.  Sonographer:     Damien Senior RDCS Referring Phys:  [id number] THOM LITTIE SLUDER Diagnosing Phys: Lonni Nanas MD PROCEDURE: After discussion of the risks and benefits of a TEE, an informed consent was obtained from the patient. The transesophogeal probe was passed without difficulty through the esophogus of the patient. Sedation performed by different physician. The patient was monitored while under deep sedation. Anesthestetic sedation was provided intravenously by Anesthesiology: 34mg  of Propofol . The patient developed no complications during the procedure. A successful direct current cardioversion was performed at 200 joules with 1 attempt. Transgastric views not obtained: esophageal stricture.  IMPRESSIONS  1. Left ventricular ejection fraction, by estimation, is 35 to 40%. The left ventricle has moderately decreased function.  2. Right ventricular systolic function is mildly reduced. The right ventricular size is normal.  3. Left atrial size was moderately dilated. No left atrial/left atrial appendage thrombus was detected.  4. Right atrial size was mildly dilated.  5. The mitral valve is normal in structure. Mild mitral valve regurgitation.  6. The aortic valve is tricuspid. Aortic valve regurgitation is mild.  7. Aortic dilatation noted. There is dilatation of the aortic root, measuring 41 mm.  8. Transgastric views not obtained. Given patient's history of esophageal stricture in distal esophagus/GE junction, only midesophageal views were obtained Conclusion(s)/Recommendation(s): No LA/LAA thrombus identified. Successful cardioversion performed with restoration of normal sinus rhythm. FINDINGS  Left Ventricle: Left ventricular ejection fraction, by estimation, is 35 to 40%.  The left ventricle has moderately decreased function. The left ventricular internal cavity size was normal in size. Right Ventricle: The right ventricular size is normal. No increase in right ventricular wall thickness. Right ventricular systolic function is mildly reduced. Left Atrium: Left atrial size was moderately dilated. No left atrial/left atrial appendage thrombus was detected. Right Atrium: Right atrial size was  mildly dilated. Pericardium: There is no evidence of pericardial effusion. Mitral Valve: The mitral valve is normal in structure. Mild mitral valve regurgitation. Tricuspid Valve: The tricuspid valve is normal in structure. Tricuspid valve regurgitation is mild. Aortic Valve: The aortic valve is tricuspid. Aortic valve regurgitation is mild. Pulmonic Valve: The pulmonic valve was grossly normal. Pulmonic valve regurgitation is trivial. Aorta: Aortic dilatation noted. There is dilatation of the aortic root, measuring 41 mm. IAS/Shunts: No atrial level shunt detected by color flow Doppler. Lonni Nanas MD Electronically signed by Lonni Nanas MD Signature Date/Time: 07/27/2023/5:32:11 PM    Final    EP STUDY Result Date: 07/27/2023 See surgical note for result.  DG ESOPHAGUS W SINGLE CM (SOL OR THIN BA) Result Date: 07/26/2023 CLINICAL DATA:  Provided history: Esophageal dysmotility. Additional history: History of achalasia. Prior lower esophageal sphincter botox, Heller myotomy/esophageal dilation and fundoplication. Plan for possible transesophageal echocardiogram. EXAM: ESOPHAGUS/BARIUM SWALLOW/TABLET STUDY TECHNIQUE: A single contrast examination was performed using thin liquid barium. Additionally, the patient swallowed a 13 mm barium tablet under fluoroscopy. The exam was performed by [place], NP, and was supervised and interpreted by Dr. Rockey golden. FLUOROSCOPY: Radiation Exposure Index (as provided by the fluoroscopic device): 44.40 mGy Kerma COMPARISON:  Chest  CT 09/14/2022. Prior esophagrams 09/13/2022 and earlier. FINDINGS: Postoperative changes from prior fundoplication. Narrowed appearance of the distal esophagus/GE junction. Moderately patulous esophagus elsewhere. A swallowed 13 mm barium tablet did not pass beyond the level of the distal esophagus/GE junction despite a prolonged period of observation, and despite the patient taking additional swallows of water  and liquid barium contrast. Moderate esophageal dysmotility with tertiary contractions. Delayed esophageal contrast transit. No gastroesophageal reflux observed. Prior ACDF. IMPRESSION: 1. Similar to the prior esophagram of 09/13/2022, there is a narrowed appearance of the distal esophagus/gastroesophageal junction. While this is at least partly related to prior fundoplication, recurrent achalasia and stricture are also considerations. A swallowed 13 mm barium tablet did not pass beyond this point despite a prolonged period of observation, and despite the patient taking additional swallows of water  and liquid barium contrast. 2. The esophagus is moderately patulous elsewhere. 3. Moderate esophageal dysmotility with tertiary contractions. 4. Delayed esophageal contrast transit. Electronically Signed   By: Rockey Childs D.O.   On: 07/26/2023 16:22   ECHOCARDIOGRAM COMPLETE Result Date: 07/26/2023    ECHOCARDIOGRAM REPORT   Patient Name:   Joseph [name] Date of Exam: 07/26/2023 Medical Rec #:  [id number]        Height:       73.0 in Accession #:    [id number]       Weight:       180.5 lb Date of Birth:  May 10, 1931        BSA:          2.060 m Patient Age:    89 years         BP:           127/95 mmHg Patient Gender: M                HR:           125 bpm. Exam Location:  Inpatient Procedure: 2D Echo, Cardiac Doppler and Color Doppler (Both Spectral and Color            Flow Doppler were utilized during procedure). Indications:    Cardiomyopathy-Unspecified I42.9  History:        Patient has no prior history  of Echocardiogram examinations.  Risk Factors:Hypertension and Diabetes.  Sonographer:    Jayson Gaskins Referring Phys: ANKIT C AMIN IMPRESSIONS  1. Left ventricular ejection fraction, by estimation, is 35 to 40%. The left ventricle has moderately decreased function. The left ventricle demonstrates global hypokinesis. There is moderate concentric left ventricular hypertrophy. Left ventricular diastolic function could not be evaluated.  2. Right ventricular systolic function is mildly reduced. The right ventricular size is mildly enlarged.  3. Left atrial size was mildly dilated.  4. Right atrial size was moderately dilated.  5. A small pericardial effusion is present.  6. The mitral valve is normal in structure. Mild mitral valve regurgitation.  7. Tricuspid valve regurgitation is severe.  8. The aortic valve is normal in structure. Aortic valve regurgitation is mild.  9. Aortic dilatation noted. There is borderline dilatation of the ascending aorta, measuring 39 mm. 10. The inferior vena cava is dilated in size with <50% respiratory variability, suggesting right atrial pressure of 15 mmHg. FINDINGS  Left Ventricle: Left ventricular ejection fraction, by estimation, is 35 to 40%. The left ventricle has moderately decreased function. The left ventricle demonstrates global hypokinesis. The left ventricular internal cavity size was normal in size. There is moderate concentric left ventricular hypertrophy. Left ventricular diastolic function could not be evaluated due to atrial fibrillation. Left ventricular diastolic function could not be evaluated. Right Ventricle: The right ventricular size is mildly enlarged. No increase in right ventricular wall thickness. Right ventricular systolic function is mildly reduced. Left Atrium: Left atrial size was mildly dilated. Right Atrium: Right atrial size was moderately dilated. Pericardium: A small pericardial effusion is present. Mitral Valve: The mitral valve is  normal in structure. Mild mitral valve regurgitation. Tricuspid Valve: The tricuspid valve is normal in structure. Tricuspid valve regurgitation is severe. Aortic Valve: The aortic valve is normal in structure. Aortic valve regurgitation is mild. Aortic valve mean gradient measures 3.0 mmHg. Aortic valve peak gradient measures 4.4 mmHg. Aortic valve area, by VTI measures 2.65 cm. Pulmonic Valve: The pulmonic valve was normal in structure. Pulmonic valve regurgitation is not visualized. Aorta: The aortic root is normal in size and structure and aortic dilatation noted. There is borderline dilatation of the ascending aorta, measuring 39 mm. Venous: The inferior vena cava is dilated in size with less than 50% respiratory variability, suggesting right atrial pressure of 15 mmHg. IAS/Shunts: No atrial level shunt detected by color flow Doppler.  LEFT VENTRICLE PLAX 2D LVIDd:         4.80 cm LVIDs:         4.20 cm LV PW:         1.40 cm LV IVS:        1.30 cm LVOT diam:     1.80 cm LV SV:         39 LV SV Index:   19 LVOT Area:     2.54 cm  RIGHT VENTRICLE RV Basal diam:  4.90 cm RV Mid diam:    4.50 cm RV S prime:     12.20 cm/s TAPSE (M-mode): 1.0 cm LEFT ATRIUM             Index        RIGHT ATRIUM           Index LA Vol (A2C):   42.0 ml 20.39 ml/m  RA Area:     21.50 cm LA Vol (A4C):   76.7 ml 37.24 ml/m  RA Volume:   65.30 ml  31.70 ml/m LA Biplane  Vol: 60.8 ml 29.52 ml/m  AORTIC VALVE AV Area (Vmax):    2.45 cm AV Area (Vmean):   2.47 cm AV Area (VTI):     2.65 cm AV Vmax:           105.00 cm/s AV Vmean:          77.700 cm/s AV VTI:            0.149 m AV Peak Grad:      4.4 mmHg AV Mean Grad:      3.0 mmHg LVOT Vmax:         101.00 cm/s LVOT Vmean:        75.400 cm/s LVOT VTI:          0.155 m LVOT/AV VTI ratio: 1.04  AORTA Ao Root diam: 3.20 cm Ao Asc diam:  3.90 cm MITRAL VALVE MV Area (PHT): 4.77 cm     SHUNTS MV Decel Time: 159 msec     Systemic VTI:  0.16 m MV E velocity: 111.00 cm/s  Systemic Diam:  1.80 cm Aditya Sabharwal Electronically signed by Ria Commander Signature Date/Time: 07/26/2023/11:39:08 AM    Final    DG Chest Portable 1 View Result Date: 07/25/2023 CLINICAL DATA:  Cough, shortness of breath. EXAM: PORTABLE CHEST 1 VIEW COMPARISON:  October 24, 2020. FINDINGS: Stable cardiomegaly. Small left pleural effusion is noted with left basilar atelectasis. Right lung is clear. Bony thorax is unremarkable. IMPRESSION: Small left pleural effusion with left basilar subsegmental atelectasis. Electronically Signed   By: Lynwood Landy Raddle M.D.   On: 07/25/2023 14:07    Subjective: Patient seen and examined at bedside.Overnight events noted.   Patient reports feeling much improved.Heart rate remains controlled but still remains in A-fib.   Patient is being discharged on amiodarone .  Discharge Exam: Vitals:   07/29/23 0512 07/29/23 0721  BP:  (!) 125/91  Pulse: 99 (!) 104  Resp:  16  Temp:  97.9 F (36.6 C)  SpO2: 99% 100%   Vitals:   07/28/23 2354 07/29/23 0351 07/29/23 0512 07/29/23 0721  BP: 114/87 119/80  (!) 125/91  Pulse: 94 (!) 104 99 (!) 104  Resp: 17 17  16   Temp: 97.7 F (36.5 C) 97.8 F (36.6 C)  97.9 F (36.6 C)  TempSrc: Oral Oral  Oral  SpO2: 94% 100% 99% 100%  Weight:   81.1 kg   Height:        General: Pt is alert, awake, not in acute distress Cardiovascular: RRR, S1/S2 +, no rubs, no gallops Respiratory: CTA bilaterally, no wheezing, no rhonchi Abdominal: Soft, NT, ND, bowel sounds + Extremities: no edema, no cyanosis    The results of significant diagnostics from this hospitalization (including imaging, microbiology, ancillary and laboratory) are listed below for reference.     Microbiology: No results found for this or any previous visit (from the past 240 hours).   Labs: BNP (last 3 results) Recent Labs    07/25/23 1330  BNP 235.2*   Basic Metabolic Panel: Recent Labs  Lab 07/25/23 1305 07/26/23 0515 07/27/23 0417 07/28/23 0434  07/29/23 0548  NA 140 135 141 141 140  K 4.3 4.4 3.7 5.0 4.1  CL 105 101 105 111 107  CO2 22 23 23 22 24   GLUCOSE 143* 308* 145* 126* 148*  BUN 18 20 20 21 22   CREATININE 1.31* 1.41* 1.33* 1.37* 1.35*  CALCIUM 9.8 8.6* 9.3 9.1 9.3  MG  --  2.0 2.0 1.9 1.9  PHOS  --  3.1  --   --  3.4   Liver Function Tests: No results for input(s): AST, ALT, ALKPHOS, BILITOT, PROT, ALBUMIN in the last 168 hours. No results for input(s): LIPASE, AMYLASE in the last 168 hours. No results for input(s): AMMONIA in the last 168 hours. CBC: Recent Labs  Lab 07/25/23 1305 07/26/23 0515 07/27/23 0417 07/29/23 0548  WBC 7.6 6.1 7.4 8.2  NEUTROABS 6.4  --   --   --   HGB 11.7* 10.2* 11.5* 11.7*  HCT 37.2* 33.0* 36.1* 37.4*  MCV 85.7 84.4 84.9 83.3  PLT 177 155 152 146*   Cardiac Enzymes: No results for input(s): CKTOTAL, CKMB, CKMBINDEX, TROPONINI in the last 168 hours. BNP: Invalid input(s): POCBNP CBG: Recent Labs  Lab 07/28/23 0808 07/28/23 1145 07/28/23 1550 07/28/23 2134 07/29/23 0720  GLUCAP 163* 130* 155* 125* 153*   D-Dimer No results for input(s): DDIMER in the last 72 hours. Hgb A1c No results for input(s): HGBA1C in the last 72 hours. Lipid Profile No results for input(s): CHOL, HDL, LDLCALC, TRIG, CHOLHDL, LDLDIRECT in the last 72 hours. Thyroid function studies No results for input(s): TSH, T4TOTAL, T3FREE, THYROIDAB in the last 72 hours.  Invalid input(s): FREET3 Anemia work up No results for input(s): VITAMINB12, FOLATE, FERRITIN, TIBC, IRON, RETICCTPCT in the last 72 hours. Urinalysis    Component Value Date/Time   COLORURINE YELLOW 10/24/2020 1010   APPEARANCEUR CLEAR 10/24/2020 1010   LABSPEC 1.014 10/24/2020 1010   PHURINE 5.5 10/24/2020 1010   GLUCOSEU NEGATIVE 10/24/2020 1010   HGBUR MODERATE (A) 10/24/2020 1010   BILIRUBINUR NEGATIVE 10/24/2020 1010   KETONESUR 15 (A) 10/24/2020 1010    PROTEINUR 30 (A) 10/24/2020 1010   UROBILINOGEN 1.0 11/16/2014 0326   NITRITE NEGATIVE 10/24/2020 1010   LEUKOCYTESUR LARGE (A) 10/24/2020 1010   Sepsis Labs Recent Labs  Lab 07/25/23 1305 07/26/23 0515 07/27/23 0417 07/29/23 0548  WBC 7.6 6.1 7.4 8.2   Microbiology No results found for this or any previous visit (from the past 240 hours).   Time coordinating discharge: Over 30 minutes  SIGNED:   Darcel Dawley, MD  [facility] 07/29/2023, 2:36 PM Pager   If 7PM-7AM, please contact night-coverage

## 2023-07-30 ENCOUNTER — Other Ambulatory Visit (HOSPITAL_COMMUNITY): Payer: Self-pay

## 2023-08-09 ENCOUNTER — Telehealth: Payer: Self-pay | Admitting: Physician Assistant

## 2023-08-09 NOTE — Telephone Encounter (Signed)
 Blood pressure borderline soft, but not terrible. If SBP is persistently < 100 mmHg, may hold losartan  until the next follow up, otherwise, continue with blood pressure and heart rate diary with one reading per day.

## 2023-08-09 NOTE — Telephone Encounter (Signed)
 Spoke with pt's daughter (per DPR) regarding his blood pressures. The following are the pt's blood pressures: 113/72, 115/67, 108/72, 92/57, 102/56 and 105/70. Daughter stated that he has been sleeping a lot since returning home from the hospital on the 29th of June. Pt is not experiencing any lightheadedness or dizziness. Pt just feels very tired. Pt is using a walker at home to help him get around because of his fatigue. Daughter reports no balance issues. Pt has an appointment with Hao Meng on 7/15. Will forward to Hao Meng for suggestions. Daughter verbalized understanding. All questions if any were answered.

## 2023-08-09 NOTE — Telephone Encounter (Signed)
 Spoke with pt's daughter regarding Joseph [name]'s suggestions. Daughter verbalized understanding. All questions if any were answered.

## 2023-08-09 NOTE — Telephone Encounter (Signed)
 Pt c/o BP issue:  1. What are your last 5 BP readings?  113/72, 115/67, 108/72, 92/57, 102/56 and 105/70   2. Are you having any other symptoms (ex. Dizziness, headache, blurred vision, passed out)?  No energy  3. What is your medication issue? Daughter said these are low blood pressure readings for him.

## 2023-08-13 NOTE — Progress Notes (Unsigned)
 Cardiology Office Note:  .   Date:  08/14/2023  ID:  Joseph [name], DOB Dec 30, 1931, MRN [id number] PCP: Auston Opal, DO  [facility] Providers Cardiologist:  Lynwood Schilling, MD {  History of Present Illness: Joseph [name] is a 89 y.o. male with persistent atrial fibrillation status post failed DCCV 07/2023, chronic HFrEF, history of myasthenia gravis, prior lung cancer s/p lobectomy 2019, hx of prostate cancer, DM, HTN.     Persistent atrial fibrillation HFrEF Diagnosed 07/2023 at outpatient neurology's office, admitted for RVR.  Underwent TEE/DCCV with early recurrence of atrial fibrillation.  EF 35 to 40%.  Of note therapy limited by myasthenia gravis.  Discharged on Eliquis  and amiodarone  200 mg twice daily.  Social history  Has daughter and granddaughters who look after him very closely.      Patient with history of myasthenia gravis, with recent diagnosis of persistent atrial fibrillation RVR with admission 07/2023.  He had successful TEE/DCCV but had early recurrence of atrial fibrillation quickly after the procedure although follow-up EKG was not ordered.  So he was discharged on amiodarone  200 mg mg twice daily with plans of future cardioversion once loaded.  Uptitrated on GDMT with spironolactone  12.5 mg, Jardiance .  Posthospitalization daughter had noted he was having some issues with fatigue.  They were encouraged to hold losartan  with systolics under 100.  Today patient presents after hospital follow-up and reports that he continues to have issues with fatigue, mild shortness of breath but generally feels unchanged since his hospitalization.  He really has not been symptomatic at all and generally completely unaware of his arrhythmia.  No significant episodes of tachycardia.  They brought in a log of his blood pressures and these have been between 100-120.  Heart rates have been less than 100.  Otherwise not having any other complaints.  No issues with  volume/congestion.  ROS: Denies: Chest pain, shortness of breath, orthopnea, peripheral edema, palpitations, decreased exercise intolerance, lightheadedness.   Studies Reviewed: Joseph    EKG Interpretation Date/Time:  Tuesday August 14 2023 11:11:21 EDT Ventricular Rate:  88 PR Interval:  188 QRS Duration:  94 QT Interval:  394 QTC Calculation: 476 R Axis:   -37  Text Interpretation: Normal sinus rhythm Left axis deviation When compared with ECG of 27-Jul-2023 13:57, Premature atrial complexes are no longer Present Confirmed by Darryle Currier [phone]) on 08/14/2023 11:35:41 AM    Risk Assessment/Calculations:    CHA2DS2-VASc Score = 4  This indicates a 4.8% annual risk of stroke. The patient's score is based upon: CHF History: 1 HTN History: 1 Diabetes History: 0 Stroke History: 0 Vascular Disease History: 0 Age Score: 2 Gender Score: 0      Physical Exam:   VS:  BP 100/60   Pulse 97   Ht 6' 1 (1.854 m)   Wt 170 lb (77.1 kg)   SpO2 94%   BMI 22.43 kg/m    Wt Readings from Last 3 Encounters:  08/14/23 170 lb (77.1 kg)  07/29/23 178 lb 12.7 oz (81.1 kg)  07/25/23 184 lb (83.5 kg)    GEN: Well nourished, well developed in no acute distress NECK: No JVD; No carotid bruits CARDIAC: RRR, no murmurs, rubs, gallops RESPIRATORY:  Clear to auscultation without rales, wheezing or rhonchi  ABDOMEN: Soft, non-tender, non-distended EXTREMITIES:  No edema; No deformity   ASSESSMENT AND PLAN: .    Persistent atrial fibrillation RVR - Successful TEE/DCCV 07/2023, but had early recurrence of atrial fibrillation  shortly after procedure Patient was discharged on amiodarone  load and today in office now back in sinus rhythm again.  He is asymptomatic during these episodes of atrial fibrillation. Continue with Eliquis  5 mg twice daily.  Need to be very mindful of his renal function (creatinine of 1.5 or greater)  as this would qualify him for reduced dosing.  He has not missed any  doses. Reduce amiodarone  to 200 mg twice daily to once daily Myasthenia gravis complicates things, CCB/BB not ideal in myasthenia gravis and with reduced EF. If he has recurrence of atrial fibrillation again there was some consideration of AV nodal ablation and PPM however given advanced age not sure if this is ideal. Discussed possibly getting cardia mobile device  Amiodarone  monitoring: Ordering hepatic function today TSH was normal 2 weeks ago, reordering today Chest x-ray performed 07/2023 He gets routine eye exams.  Chronic HFrEF Suspect this is tachycardia mediated in the setting of RVR as above.  EF is 35 to 40%, moderate concentric LVH.  Mildly reduced RV function.  Mildly dilated LA.  Small pericardial effusion noted.  Mild MR.  Severe TR.  Aortic dilatation 39 mm.  Euvolemic and without any significant issues of volume/congestion. GDMT: Continue with Jardiance  10 mg, losartan  25 mg and spironolactone  12.5 mg.  GDMT limited by soft blood pressure. Repeat limited echocardiogram in 2 to 3 months If EF remains low likely would not be too ag high cannot gressive about pursuing ischemic evaluation.  Symptoms do not suggest that he has any obstructive CAD.  MG Follows with neurology, has had significant improvement in symptoms over the years and responded well to treatment.   Anemia Need to continue to monitor to ensure safety with DOAC.  Appears stable. No falls or bleeding issues.   Hypertension Well-controlled in the context above.  Diabetes A1c 6.9% 2 years ago  Dispo: 45-month follow-up with me, reassess to see if he still maintaining sinus rhythm.  Signed, Thom LITTIE Sluder, PA-C

## 2023-08-14 ENCOUNTER — Encounter: Payer: Self-pay | Admitting: Physician Assistant

## 2023-08-14 ENCOUNTER — Ambulatory Visit: Attending: Physician Assistant | Admitting: Cardiology

## 2023-08-14 VITALS — BP 100/60 | HR 97 | Ht 73.0 in | Wt 170.0 lb

## 2023-08-14 DIAGNOSIS — I5020 Unspecified systolic (congestive) heart failure: Secondary | ICD-10-CM

## 2023-08-14 DIAGNOSIS — I4819 Other persistent atrial fibrillation: Secondary | ICD-10-CM | POA: Diagnosis not present

## 2023-08-14 DIAGNOSIS — G7000 Myasthenia gravis without (acute) exacerbation: Secondary | ICD-10-CM | POA: Diagnosis not present

## 2023-08-14 DIAGNOSIS — Z79899 Other long term (current) drug therapy: Secondary | ICD-10-CM | POA: Diagnosis not present

## 2023-08-14 MED ORDER — AMIODARONE HCL 200 MG PO TABS: 200.0000 mg | ORAL_TABLET | Freq: Every day | ORAL | 3 refills | Status: AC

## 2023-08-14 MED ORDER — SPIRONOLACTONE 25 MG PO TABS: 12.5000 mg | ORAL_TABLET | Freq: Every day | ORAL | 3 refills | Status: AC

## 2023-08-14 MED ORDER — APIXABAN 5 MG PO TABS: 5.0000 mg | ORAL_TABLET | Freq: Two times a day (BID) | ORAL | 2 refills | Status: AC

## 2023-08-14 MED ORDER — AMIODARONE HCL 200 MG PO TABS: 200.0000 mg | ORAL_TABLET | Freq: Every day | ORAL | Status: DC

## 2023-08-14 MED ORDER — LOSARTAN POTASSIUM 25 MG PO TABS: 25.0000 mg | ORAL_TABLET | Freq: Every day | ORAL | 3 refills | Status: AC

## 2023-08-14 MED ORDER — EMPAGLIFLOZIN 10 MG PO TABS: 10.0000 mg | ORAL_TABLET | Freq: Every day | ORAL | 3 refills | Status: AC

## 2023-08-14 NOTE — Patient Instructions (Signed)
 Medication Instructions:  DECREASE AMIODARONE  TO 200 MG DAILY *If you need a refill on your cardiac medications before your next appointment, please call your pharmacy*  Lab Work: TSH AND LFT TODAY If you have labs (blood work) drawn today and your tests are completely normal, you will receive your results only by: MyChart Message (if you have MyChart) OR A paper copy in the mail If you have any lab test that is abnormal or we need to change your treatment, we will call you to review the results.  Testing/Procedures:[address] IN SEPT OR OCT 2025 Your physician has requested that you have an echocardiogram. Echocardiography is a painless test that uses sound waves to create images of your heart. It provides your doctor with information about the size and shape of your heart and how well your heart's chambers and valves are working. This procedure takes approximately one hour. There are no restrictions for this procedure. Please do NOT wear cologne, perfume, aftershave, or lotions (deodorant is allowed). Please arrive 15 minutes prior to your appointment time.  Please note: We ask at that you not bring children with you during ultrasound (echo/ vascular) testing. Due to room size and safety concerns, children are not allowed in the ultrasound rooms during exams. Our front office staff cannot provide observation of children in our lobby area while testing is being conducted. An adult accompanying a patient to their appointment will only be allowed in the ultrasound room at the discretion of the ultrasound technician under special circumstances. We apologize for any inconvenience.   Follow-Up: At [facility], you and your health needs are our priority.  As part of our continuing mission to provide you with exceptional heart care, our providers are all part of one team.  This team includes your primary Cardiologist (physician) and Advanced Practice Providers or APPs (Physician  Assistants and Nurse Practitioners) who all work together to provide you with the care you need, when you need it.  Your next appointment:   KEEP FOLLOW UP SEPT 2025  Provider:   Scot Ford, PA-C/ Sheng Haley, PA-C

## 2023-08-15 LAB — HEPATIC FUNCTION PANEL
ALT: 16 IU/L (ref 0–44)
AST: 25 IU/L (ref 0–40)
Albumin: 4.8 g/dL — ABNORMAL HIGH (ref 3.6–4.6)
Alkaline Phosphatase: 100 IU/L (ref 44–121)
Bilirubin Total: 0.6 mg/dL (ref 0.0–1.2)
Bilirubin, Direct: 0.27 mg/dL (ref 0.00–0.40)
Total Protein: 6.9 g/dL (ref 6.0–8.5)

## 2023-08-15 LAB — TSH: TSH: 1.26 u[IU]/mL (ref 0.450–4.500)

## 2023-08-16 ENCOUNTER — Ambulatory Visit: Payer: Self-pay | Admitting: Cardiology

## 2023-09-06 ENCOUNTER — Telehealth: Payer: Self-pay

## 2023-09-06 NOTE — Telephone Encounter (Signed)
 Auth Submission: APPROVED Site of care: Site of care: [facility] Payer: [employer] Medication & CPT/J Code(s) submitted: Vyvgart  (Efgartigimod alfa ) [id number] Diagnosis Code:  Route of submission (phone, fax, portal): fax Phone # Fax # [phone]  Auth type: Buy/Bill PB Units/visits requested: 1008mg  x 8 doses Reference number: [id number] Approval from: 09/05/23 to 12/04/23   [employer] approves PA's for 90 days. This prior auth approval covers 2 rounds of 4 doses each. A new prior auth cannot be submitted until after the expiration date of this prior auth, per Hailey from [facility] [employer].

## 2023-09-18 ENCOUNTER — Ambulatory Visit (INDEPENDENT_AMBULATORY_CARE_PROVIDER_SITE_OTHER)

## 2023-09-18 VITALS — BP 113/71 | HR 80 | Temp 98.0°F | Resp 18 | Ht 73.0 in | Wt 173.0 lb

## 2023-09-18 DIAGNOSIS — G7001 Myasthenia gravis with (acute) exacerbation: Secondary | ICD-10-CM

## 2023-09-18 MED ORDER — EFGARTIGIMOD ALFA-HYALUR-QVFC 180-2000 MG-UNIT/ML ~~LOC~~ SOLN
1008.0000 mg | Freq: Once | SUBCUTANEOUS | Status: AC
Start: 2023-09-18 — End: 2023-09-18
  Administered 2023-09-18: 1008 mg via SUBCUTANEOUS
  Filled 2023-09-18: qty 5.6

## 2023-09-18 NOTE — Progress Notes (Signed)
 Diagnosis: Myasthenia Gravis  Provider:  Mannam, Praveen MD  Procedure: Injection  Vyvgart , Dose: 1008mg , Site: subcutaneous, Number of injections: 1  Injection Site(s): Left lower quad. abdomen  Post Care: Observation period completed  Discharge: Condition: Good, Destination: Home . AVS Provided  Performed by:  Hawa Henly, RN

## 2023-09-25 ENCOUNTER — Ambulatory Visit

## 2023-09-25 VITALS — BP 120/75 | HR 83 | Temp 98.0°F | Resp 20 | Ht 73.0 in | Wt 172.8 lb

## 2023-09-25 DIAGNOSIS — G7001 Myasthenia gravis with (acute) exacerbation: Secondary | ICD-10-CM

## 2023-09-25 MED ORDER — EFGARTIGIMOD ALFA-HYALUR-QVFC 180-2000 MG-UNIT/ML ~~LOC~~ SOLN
1008.0000 mg | Freq: Once | SUBCUTANEOUS | Status: AC
  Administered 2023-09-25: 1008 mg via SUBCUTANEOUS

## 2023-09-25 NOTE — Progress Notes (Signed)
 Diagnosis: Myasthenia Gravis  Provider:  Mannam, Praveen MD  Procedure: Injection  Efgartigimod alfa  & hyaluronidase -qvfc (Vyvgart  Hytrulo) , Dose: 1008 mg, Site: subcutaneous, Number of injections: 1  Injection Site(s): Right upper quad. abdomen  Post Care: Observation period completed  Discharge: Condition: Good, Destination: Home . AVS provided.  Performed by:  Megen Madewell G Pilkington-Burchett, RN

## 2023-09-26 ENCOUNTER — Telehealth: Payer: Self-pay | Admitting: Neurology

## 2023-09-26 NOTE — Telephone Encounter (Signed)
Patient daughter advised

## 2023-09-26 NOTE — Telephone Encounter (Signed)
 Pt.s daughter wants to know if he can take Rx CIPROSLOXACIN 0.3% for ear infection used for a week twice a day, drops ok to use with Rx's that he takes.

## 2023-09-26 NOTE — Telephone Encounter (Signed)
 He should be okay to take the ear drops.  I recommend monitoring for any signs of weakness.

## 2023-10-02 ENCOUNTER — Ambulatory Visit (INDEPENDENT_AMBULATORY_CARE_PROVIDER_SITE_OTHER)

## 2023-10-02 VITALS — BP 128/75 | HR 82 | Temp 97.5°F | Resp 18 | Ht 73.0 in | Wt 172.4 lb

## 2023-10-02 DIAGNOSIS — G7001 Myasthenia gravis with (acute) exacerbation: Secondary | ICD-10-CM | POA: Diagnosis not present

## 2023-10-02 MED ORDER — EFGARTIGIMOD ALFA-HYALUR-QVFC 180-2000 MG-UNIT/ML ~~LOC~~ SOLN
1008.0000 mg | Freq: Once | SUBCUTANEOUS | Status: AC
Start: 2023-10-02 — End: 2023-10-02
  Administered 2023-10-02: 1008 mg via SUBCUTANEOUS
  Filled 2023-10-02: qty 5.6

## 2023-10-02 NOTE — Progress Notes (Signed)
 Diagnosis: Myasthenia Gravis  Provider:  Mannam, Praveen MD  Procedure: Injection  Vyvgart , Dose: 1008mg , Site: subcutaneous, Number of injections: 1  Injection Site(s): Left lower quad. abdomen  Post Care: Observation period completed  Discharge: Condition: Good, Destination: Home . AVS Provided  Performed by:  Hawa Henly, RN

## 2023-10-08 ENCOUNTER — Ambulatory Visit (HOSPITAL_COMMUNITY)
Admission: RE | Admit: 2023-10-08 | Discharge: 2023-10-08 | Disposition: A | Discharge status: Home / Self Care | Source: Ambulatory Visit | Attending: Internal Medicine | Admitting: Internal Medicine

## 2023-10-08 DIAGNOSIS — I5020 Unspecified systolic (congestive) heart failure: Secondary | ICD-10-CM | POA: Insufficient documentation

## 2023-10-08 LAB — ECHOCARDIOGRAM LIMITED
Area-P 1/2: 4.94 cm2
P 1/2 time: 354 ms
S' Lateral: 2.9 cm

## 2023-10-09 ENCOUNTER — Ambulatory Visit (INDEPENDENT_AMBULATORY_CARE_PROVIDER_SITE_OTHER)

## 2023-10-09 VITALS — BP 93/64 | HR 84 | Temp 97.7°F | Resp 14 | Ht 73.0 in | Wt 172.0 lb

## 2023-10-09 DIAGNOSIS — G7001 Myasthenia gravis with (acute) exacerbation: Secondary | ICD-10-CM

## 2023-10-09 MED ORDER — EFGARTIGIMOD ALFA-HYALUR-QVFC 180-2000 MG-UNIT/ML ~~LOC~~ SOLN
1008.0000 mg | Freq: Once | SUBCUTANEOUS | Status: AC
  Administered 2023-10-09: 1008 mg via SUBCUTANEOUS
  Filled 2023-10-09: qty 5.6

## 2023-10-09 NOTE — Progress Notes (Signed)
 Diagnosis: Myasthenia Gravis  Provider:  Mannam, Praveen MD  Procedure: Injection  Vyvgart , Dose: 1008 mg, Site: subcutaneous, Number of injections: 1  Injection Site(s): Right lower quad. abdomne  Post Care: Observation period completed  Discharge: Condition: Good, Destination: Home . AVS Provided  Performed by:  Elex Mainwaring, RN

## 2023-10-17 ENCOUNTER — Encounter (HOSPITAL_BASED_OUTPATIENT_CLINIC_OR_DEPARTMENT_OTHER): Payer: Self-pay

## 2023-10-18 NOTE — Progress Notes (Unsigned)
 Cardiology Office Note:  .   Date:  10/19/2023  ID:  Joseph [name], DOB [birth year], MRN [id number] PCP: Joseph Opal, DO  [facility] Providers Cardiologist:  Joseph Schilling, MD Cardiology APP:  Joseph Joseph LITTIE, PA-C {  History of Present Illness: Joseph [name] is a 89 y.o. male with history of  persistent atrial fibrillation status post DCCV with 07/2023 with early recurrence, tachycardia mediated cardiomyopathy with recovered EF, history of myasthenia gravis, prior lung cancer s/p lobectomy 2019, hx of prostate cancer, DM, HTN.      Persistent atrial fibrillation Tachycardia mediated cardiomyopathy  diagnosed 07/2023 at outpatient neurology's office, admitted for RVR.  Underwent TEE/DCCV with early recurrence of atrial fibrillation.  EF 35 to 40%.  Of note therapy limited by myasthenia gravis.  Discharged on Eliquis  and amiodarone  200 mg twice daily. Back in sinus rhythm in office 07/2023 EF recovered 10/2023   Social history  Has daughter and granddaughters who look after him very closely.     Patient with history of myasthenia gravis, with recent diagnosis of persistent atrial fibrillation RVR with admission 07/2023.  He had successful TEE/DCCV but had early recurrence of atrial fibrillation during hospitalization.  So he was discharged on amiodarone  200 mg mg twice daily with plans of future cardioversion once loaded.  I saw him in clinic 07/2023 and he had been reporting persistent fatigue, shortness of breath.  He was back in sinus rhythm.  Amiodarone  decreased to 200 mg daily.  Today patient presents for follow-up.  He is accompanied with his wife today.  They report that he is feeling excellent and much improved since his hospitalizations and with no acute complaints.  They have been compliant with all of his medications.  Have not noted any tachycardia, palpitations, chest pain, shortness of breath.  Wife is trying to get him to walk more.  They have been documenting  blood pressure for the past 2 months every single day with perfectly controlled blood pressure.  No falls, hypotensive events or dizziness.   ROS: Denies: Chest pain, shortness of breath, orthopnea, peripheral edema, palpitations, decreased exercise intolerance, fatigue, lightheadedness.   Studies Reviewed: .         Risk Assessment/Calculations:    CHA2DS2-VASc Score = 4   This indicates a 4.8% annual risk of stroke. The patient's score is based upon: CHF History: 1 HTN History: 1 Diabetes History: 0 Stroke History: 0 Vascular Disease History: 0 Age Score: 2 Gender Score: 0       Physical Exam:   VS:  BP 104/66   Pulse 72   Ht 6' 1 (1.854 m)   Wt 173 lb 12.8 oz (78.8 kg)   SpO2 99%   BMI 22.93 kg/m    Wt Readings from Last 3 Encounters:  10/19/23 173 lb 12.8 oz (78.8 kg)  10/09/23 172 lb (78 kg)  10/02/23 172 lb 6.4 oz (78.2 kg)    GEN: Well nourished, well developed in no acute distress NECK: No JVD; No carotid bruits CARDIAC: RRR, 2 out of 6 murmur RESPIRATORY:  Clear to auscultation without rales, wheezing or rhonchi  ABDOMEN: Soft, non-tender, non-distended EXTREMITIES:  No edema; No deformity   ASSESSMENT AND PLAN: .    Persistent atrial fibrillation RVR - Successful TEE/DCCV 07/2023, but had early recurrence of atrial fibrillation shortly after procedure.  Started on Amio - Back in sinus rhythm outpatient 07/2023 Based on physical exam seems like he still maintaining sinus rhythm and feeling  overall improved. Continue with Eliquis  5 mg twice daily.  Need to be very mindful of his renal function (creatinine of 1.5 or greater). Continue amiodarone  200 mg daily Myasthenia gravis complicates things, CCB/BB not ideal in myasthenia gravis and with reduced EF. If he has recurrence of atrial fibrillation again there was some consideration of AV nodal ablation and PPM however given advanced age not sure if this is ideal. Discussed possibly getting cardia mobile  device and/or Apple watch   Amiodarone  monitoring: Normal LFTs 07/2023 TSH was normal 07/2023 Chest x-ray performed 07/2023 He has upcoming appointment with eye Dr.   Tachycardia mediated cardiomyopathy with recovered EF  -EF 35 to 40% in the setting of RVR 07/2023 -EF recovered 10/2023 Limited echocardiogram 10/2023 demonstrating completely recovered EF with moderately dilated LA.  Mild to moderate TR, moderate AR.  Aortic dilatation 40 mm.  NYHA class I/II, euvolemic. GDMT: Continue with Jardiance  10 mg, losartan  25 mg and spironolactone  12.5 mg.  Continue to monitor valvular disease with echocardiogram 1 to 2 years   MG Follows with neurology, has had significant improvement in symptoms over the years and responded well to treatment.  Has upcoming appointment with them.   Hypertension Well-controlled in the context above.   Diabetes A1C 7.3 on 09/25/23     Dispo: 67-month follow-up with Dr. Lavona  Signed, Joseph [name] Sluder, PA-C

## 2023-10-19 ENCOUNTER — Ambulatory Visit: Attending: Physician Assistant | Admitting: Cardiology

## 2023-10-19 ENCOUNTER — Encounter: Payer: Self-pay | Admitting: Physician Assistant

## 2023-10-19 VITALS — BP 104/66 | HR 72 | Ht 73.0 in | Wt 173.8 lb

## 2023-10-19 DIAGNOSIS — G7000 Myasthenia gravis without (acute) exacerbation: Secondary | ICD-10-CM | POA: Diagnosis not present

## 2023-10-19 DIAGNOSIS — I4819 Other persistent atrial fibrillation: Secondary | ICD-10-CM

## 2023-10-19 DIAGNOSIS — I5032 Chronic diastolic (congestive) heart failure: Secondary | ICD-10-CM | POA: Diagnosis not present

## 2023-10-19 NOTE — Patient Instructions (Signed)
 Medication Instructions:  Your physician recommends that you continue on your current medications as directed. Please refer to the Current Medication list given to you today.  *If you need a refill on your cardiac medications before your next appointment, please call your pharmacy*  Lab Work: NONE If you have labs (blood work) drawn today and your tests are completely normal, you will receive your results only by: MyChart Message (if you have MyChart) OR A paper copy in the mail If you have any lab test that is abnormal or we need to change your treatment, we will call you to review the results.  Testing/Procedures: NONE  Follow-Up: At [facility], you and your health needs are our priority.  As part of our continuing mission to provide you with exceptional heart care, our providers are all part of one team.  This team includes your primary Cardiologist (physician) and Advanced Practice Providers or APPs (Physician Assistants and Nurse Practitioners) who all work together to provide you with the care you need, when you need it.  Your next appointment:   6 month(s)  Provider:   Lynwood Schilling, MD

## 2023-10-24 ENCOUNTER — Encounter: Payer: Self-pay | Admitting: Neurology

## 2023-10-24 ENCOUNTER — Ambulatory Visit: Admitting: Neurology

## 2023-10-24 VITALS — BP 111/65 | HR 90 | Ht 73.0 in | Wt 175.0 lb

## 2023-10-24 DIAGNOSIS — G7000 Myasthenia gravis without (acute) exacerbation: Secondary | ICD-10-CM | POA: Diagnosis not present

## 2023-10-24 NOTE — Progress Notes (Signed)
 Follow-up Visit   Date: 10/24/2023    Joseph [name] MRN: [id number] DOB: 1931-05-20    Joseph [name] is a 89 y.o. left-handed Caucasian male with diet controlled diabetes mellitus, GERD, hypertension, history of prostate cancer, lung cancer s/p left upper lobe resection (2019), and atrial fibrillation returning to the clinic for follow-up of seropositive myasthenia gravis.  The patient was accompanied to the clinic by daughter who also provides collateral information.    IMPRESSION/PLAN: Seropositive generalized myasthenia gravis without exacerbation, thymoma negative (08/2022).  No evidence of disease manifestation on exam today.  He reports GI side effects which may be due to mestinon , so will plan to taper this off.  Going forward, the plan will be to slowly taper his prednisone .  The highest dose of prednisone  is 10mg .  He has responded extremely well to Vyvgart  Hytrulo which was started in Sept 2024.   - Continue Vyvgart  Hytrulo cycles every 9 weeks  - Continue prednisone  5mg  daily - plan to taper at next visit  - Taper mestinon  by 1 tablet every 2 weeks and then stop due to medication side effects  Return to clinic in 3 months  --------------------------------------------- History of present illness: Early August 2024, he began having double vision and left eye ptosis.  Daughter went to the ER due to concern of stroke where CT head was negative.  He saw Dr. Octavia the following day who felt that symptoms were due to myasthenia gravis so urged him to go to the ER.  He went to [facility] where MRI brain did not show stroke.  There was high clinical suspicion for myasthenia so he was started on mestinon  30mg  three times daily which did help his difficulty with swallowing and shortness of breath.  Unfortunately, there has been no improvement with droopiness of the eyelids.  Eyelids are slightly better for about 15 minutes in the morning, but then quickly start to feel heavy again.  He does not notice double vision much because eyes stay closed.  He is very frustrated by not being able to see or keep his eyes open.    UPDATE 10/25/2022:  He is here for follow-up visit.  He has been on prednisone  10mg  daily x 2 weeks, and received his second infusion of Vyvgart  Hytrulo yesterday.  Over the past 3-4 days, he noticed that his right eyelid was opening up and staying open for longer periods of time.  When tired and in the evening, it continues to get droopy.  His speech and swallow is normal.  He has some weakness in the legs and uses a walker. Overall, he is feeling much better.  No new complaints.  He has been monitor blood sugar at home which is staying 140-180s.    UPDATE 11/29/2022:  He is here for for follow-up visit.  He reports doing great and denies any ptosis, facial weakness, slurred, or difficulty with swallowing.  He is back to doing his usual activities and mood is much better.  He is scheduled for Vyvgart  Hytrullo next month.   UPDATE 02/21/2023:  He is here for follow-up visit.  He has been doing well on prednisone  7mg  daily. He denies double vision, droopiness of the eyelids, speech/swallowing difficulty, or weakness.   He completed Vyvgart  Hytrulo with last infusion on 12/10.  He was billed $3200 for his infusions last year and was told it would cost him $3400 with the first injection.  He was having quivering' of the left leg and  had steroid injection to the left hip after which his symptoms have signficantly improved.    UPDATE 04/25/2023:  He started having difficulty with swallowing and started droopy again around late February.  He was approved for Vyvgart  Hytrulo and within one week of his first infusion, he started having improvement.  He denies any current double vision, droopiness of the eyelids, or limb weakness. He remains on prednisone  5mg  daily.   UPDATE 07/25/2023:  He is here for follow-up visit.  He completed Vyvgart  Hytrulo on 6/17 which was 8 weeks from  prior cycle and has been doing great.  He did not have any MG symptoms prior to his infusion.  He is doing well and denies droopiness, problems with speech/swallow, or weakness.  He is bothered by excessive tearing from his eyes, which has been present the past few months.  No new complaints.   Of note, his HR is elevated today and irregular.  He denies chest pain, palpitation, or shortness of breath.   UPDATE 10/24/2023:  He is here for follow-up visit.  His MG is doing great and he denies any droopiness, double vision, difficulty with speech/swallow, or weakness.  His last Vyvgart  Hytrulo was completed on 9/9 and he is rescheduled in 9 weeks.  He has tolerated this very well.  He does report having GI upset and increased loose stool and asking if medications could be contributing.    Since he was last here, he was newly diagnosed with atrial fibrillation and converted to NSR on medication.  He is now on anticoagulation.     Medications:  Current Outpatient Medications on File Prior to Visit  Medication Sig Dispense Refill   acetaminophen  (TYLENOL ) 500 MG tablet Take 500-1,000 mg by mouth every 8 (eight) hours as needed for moderate pain or headache.     amiodarone  (PACERONE ) 200 MG tablet Take 1 tablet (200 mg total) by mouth daily. 90 tablet 3   amoxicillin (AMOXIL) 500 MG capsule Take 2,000 mg by mouth See admin instructions. Take 4 capsules (2000 mg) by mouth 1 hours prior to procedure.     apixaban  (ELIQUIS ) 5 MG TABS tablet Take 1 tablet (5 mg total) by mouth 2 (two) times daily. 180 tablet 2   Ascorbic Acid (VITAMIN C) POWD Take 1 packet by mouth daily.     CALCIUM PO Take 1 tablet by mouth daily.     Cholecalciferol (VITAMIN D-3 PO) Take 1 capsule by mouth daily.     ELDERBERRY PO Take 1 each by mouth daily. Elderberry gummy supplement     empagliflozin  (JARDIANCE ) 10 MG TABS tablet Take 1 tablet (10 mg total) by mouth daily. 90 tablet 3   guaiFENesin  (ROBITUSSIN) 100 MG/5ML liquid Take  5 mLs by mouth every 4 (four) hours as needed for cough or to loosen phlegm. 120 mL 0   ipratropium (ATROVENT ) 0.06 % nasal spray Place 1 spray into both nostrils 3 (three) times daily.     loratadine  (CLARITIN ) 10 MG tablet Take 10 mg by mouth daily.     losartan  (COZAAR ) 25 MG tablet Take 1 tablet (25 mg total) by mouth daily. 90 tablet 3   meclizine  (ANTIVERT ) 25 MG tablet Take 25 mg by mouth 3 (three) times daily as needed for dizziness.     Multiple Vitamins-Minerals (MULTIVITAMIN MEN 50+) TABS Take 1 tablet by mouth daily.     ondansetron  (ZOFRAN ) 4 MG tablet Take 4 mg by mouth every 8 (eight) hours as needed for nausea or  vomiting.     pantoprazole  (PROTONIX ) 40 MG tablet Take 1 tablet (40 mg total) by mouth 2 (two) times daily. 180 tablet 4   predniSONE  (DELTASONE ) 5 MG tablet Take 1 tablet (5 mg total) by mouth daily with breakfast. 90 tablet 1   pyridostigmine  (MESTINON ) 60 MG tablet TAKE 1 TABLET BY MOUTH 3 TIMES DAILY. 270 tablet 1   spironolactone  (ALDACTONE ) 25 MG tablet Take 0.5 tablets (12.5 mg total) by mouth daily. 90 tablet 3   No current facility-administered medications on file prior to visit.    Allergies:  Allergies  Allergen Reactions   No Known Allergies Other (See Comments)    Pt has no known allergies, but has Hx of Myasthenia Gravis    Vital Signs:  BP 111/65   Pulse 90   Ht 6' 1 (1.854 m)   Wt 175 lb (79.4 kg)   SpO2 95%   BMI 23.09 kg/m   Neurological Exam: MENTAL STATUS including orientation to time, place, person, recent and remote memory, attention span and concentration, language, and fund of knowledge is normal.  Speech is not dysarthric.  CRANIAL NERVES:    Pupils equal round and reactive to light.  Normal conjugate, extra-ocular eye movements in all directions of gaze.  Minimal right ptosis at rest with no worsening with sustained upgaze. Face is symmetric. Orbicularis oculi 5/5, orbicularis oris 5/5, buccinator 5/5.  Palate elevates  symmetrically.  Tongue is midline and strength is 5/5  MOTOR:  Motor strength is 5/5 in all extremities, including bilateral hip flexors.  No atrophy, fasciculations or abnormal movements.  No pronator drift.  Tone is normal.    COORDINATION/GAIT:  He is able to stand up without pushing off with arms.  Gait is mildly wide-based, unassisted.    Data: Labs 09/13/2022:  AChR antibody binding 3.73*   MRI brain wwo contrast 09/12/2022: 1. No acute intracranial pathology or other finding to explain the patient's symptoms. 2. 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 3. On initial injection of intravenous contrast, the patient's IV infiltrative and 7.5 cc con trast was injected. Per the technologist, there was mild swelling at the injection site but no significant pain or neurologic symptoms. Recommend monitoring of the IV site while the patient is in the ED/inpatient, with instructions to return if progressive pain or neurologic symptoms develop.   CT chest wo contrast 09/15/2022:  No evidence of thymoma. Status post left upper lobectomy. No evidence of recurrent or metastatic disease. Moderate hiatal hernia with mildly irregular left lateral wall thickening, poorly evaluated. Consider endoscopy for further evaluation.   Aortic Atherosclerosis (ICD10-I70.0) and Emphysema (ICD10-J43.9).    Thank you for allowing me to participate in patient's care.  If I can answer any additional questions, I would be pleased to do so.    Sincerely,    Remberto Lienhard K. Tobie, DO

## 2023-10-24 NOTE — Patient Instructions (Addendum)
 Stop morning mestinon  and continue 1 tablet at 11a and 4pm x 2 week. Then stop 11a tablet and continue mestinon  1 tablet at 4pm x 2 weeks. Then stop mestinon  completely.  Continue prednisone  5mg  daily  Continue Vyvgart  Hytrulo

## 2023-10-30 ENCOUNTER — Other Ambulatory Visit: Payer: Self-pay | Admitting: Neurology

## 2023-10-31 ENCOUNTER — Telehealth: Payer: Self-pay | Admitting: Neurology

## 2023-10-31 MED ORDER — PREDNISONE 5 MG PO TABS: 5.0000 mg | ORAL_TABLET | Freq: Every day | ORAL | 1 refills | Status: AC

## 2023-10-31 NOTE — Telephone Encounter (Signed)
 Pt's daughter called in and left a message. She stated the pt was just in to see Dr. Tobie not long ago and she wanted him to keep taking his prednisone  until he is seen again. [facility] called them and said our office refused the refill of the prednisone . She would like a call back to clarify.

## 2023-10-31 NOTE — Telephone Encounter (Signed)
 Called patient and left a message for a call back.

## 2023-10-31 NOTE — Telephone Encounter (Signed)
 Prednisone  5mg  refilled.  Please call pharmacy to clarify why it cannot be filled.  Thanks.

## 2023-11-09 ENCOUNTER — Other Ambulatory Visit: Payer: Self-pay | Admitting: Neurology

## 2023-12-06 ENCOUNTER — Other Ambulatory Visit (HOSPITAL_COMMUNITY): Payer: Self-pay | Admitting: Neurology

## 2023-12-06 ENCOUNTER — Ambulatory Visit (INDEPENDENT_AMBULATORY_CARE_PROVIDER_SITE_OTHER)

## 2023-12-06 ENCOUNTER — Telehealth: Payer: Self-pay | Admitting: Pharmacy Technician

## 2023-12-06 ENCOUNTER — Encounter: Payer: Self-pay | Admitting: Neurology

## 2023-12-06 ENCOUNTER — Telehealth: Payer: Self-pay | Admitting: Neurology

## 2023-12-06 VITALS — BP 111/70 | HR 62 | Temp 98.0°F | Resp 16 | Ht 73.0 in | Wt 173.2 lb

## 2023-12-06 DIAGNOSIS — G7001 Myasthenia gravis with (acute) exacerbation: Secondary | ICD-10-CM | POA: Diagnosis not present

## 2023-12-06 MED ORDER — EFGARTIGIMOD ALFA-HYALUR-QVFC 180-2000 MG-UNIT/ML ~~LOC~~ SOLN
1008.0000 mg | Freq: Once | SUBCUTANEOUS | Status: AC
  Administered 2023-12-06: 1008 mg via SUBCUTANEOUS

## 2023-12-06 MED ORDER — PYRIDOSTIGMINE BROMIDE 60 MG PO TABS: 60.0000 mg | ORAL_TABLET | Freq: Three times a day (TID) | ORAL | 1 refills | Status: AC

## 2023-12-06 NOTE — Telephone Encounter (Signed)
 Pt's daughter Santana called in this morning. Santana stated that her father has been having a hard time swallowing since last Friday. Santana wanted to know  should they start the Prescription called: pyridostigmine  (MESTINON ) 60 MG tablet  or wait til he gets the injection. Thanks

## 2023-12-06 NOTE — Telephone Encounter (Signed)
 Yes, please have him start mestinon  60mg  at 8a, 1pm, and 6p. We can also ask Luke if he can come tomorrow or Monday for his injection, if available, since he is having symptoms.

## 2023-12-06 NOTE — Progress Notes (Signed)
 Diagnosis: Myasthenia Gravis  Provider:  Mannam, Praveen MD  Procedure: Injection  Vyvgart , Dose: 1008mg , Site: subcutaneous, Number of injections: 1  Injection Site(s): Left lower quad. abdomen  Post Care: Observation period completed  Discharge: Condition: Good, Destination: Home . AVS Provided  Performed by:  Hawa Henly, RN

## 2023-12-06 NOTE — Telephone Encounter (Signed)
 Infusion center will be able to administer Vyvgart  Hytrulo injection today.  Will plan to adjust his injections to every 8 weeks.

## 2023-12-06 NOTE — Telephone Encounter (Addendum)
 Auth Submission: APPROVED Site of care: Site of care: [facility] Payer: [employer] Medication & CPT/J Code(s) submitted: VYVGART  HYTRULLO [id number] Diagnosis Code:  Route of submission (phone, fax, portal):  Phone # Fax # Auth type: Buy/Bill PB Units/visits requested: 4WKS ON 4WKS OFF (2 CYCLES) 8 DOSES Reference number: [id number] Approval from: 12/06/23 to 03/05/24

## 2023-12-06 NOTE — Telephone Encounter (Signed)
 Called patients daughter and informed her of Dr. Anthony recommendations  To have him start mestinon  60mg  at 8a, 1pm, and 6p. We can also ask Luke if he can come tomorrow or Monday for his injection, if available, since he is having symptoms.     Patients daughter and patient is agreeable to move up injections. Patient will get started on mestinon  and Santana has given him his first dose while on the phone at 9:16am. Santana wanted to know if she could change the time of his meds to 6:30, 11:30, 4:40? Since he takes his other meds at that time.

## 2023-12-11 ENCOUNTER — Ambulatory Visit

## 2023-12-13 ENCOUNTER — Ambulatory Visit (INDEPENDENT_AMBULATORY_CARE_PROVIDER_SITE_OTHER)

## 2023-12-13 ENCOUNTER — Ambulatory Visit

## 2023-12-13 VITALS — BP 120/78 | HR 80 | Temp 97.8°F | Resp 16 | Ht 73.0 in | Wt 172.0 lb

## 2023-12-13 DIAGNOSIS — G7001 Myasthenia gravis with (acute) exacerbation: Secondary | ICD-10-CM | POA: Diagnosis not present

## 2023-12-13 MED ORDER — EFGARTIGIMOD ALFA-HYALUR-QVFC 180-2000 MG-UNIT/ML ~~LOC~~ SOLN
1008.0000 mg | Freq: Once | SUBCUTANEOUS | Status: AC
  Administered 2023-12-13: 1008 mg via SUBCUTANEOUS
  Filled 2023-12-13: qty 5.6

## 2023-12-13 NOTE — Progress Notes (Signed)
 Diagnosis: Myasthenia Gravis  Provider:  Mannam, Praveen MD  Procedure: Injection  Vyvgart   Hytrulo, Dose: 1008mg , Site: subcutaneous, Number of injections: 1  Injection Site(s): Right lower quad. abdomne  Post Care: Observation period completed  Discharge: Condition: Good, Destination: Home . AVS Provided  Performed by:  Morgana Rowley, RN

## 2023-12-18 ENCOUNTER — Ambulatory Visit

## 2023-12-20 ENCOUNTER — Ambulatory Visit

## 2023-12-20 ENCOUNTER — Ambulatory Visit (INDEPENDENT_AMBULATORY_CARE_PROVIDER_SITE_OTHER)

## 2023-12-20 VITALS — BP 116/73 | HR 72 | Temp 97.9°F | Resp 16 | Ht 73.0 in | Wt 173.6 lb

## 2023-12-20 DIAGNOSIS — G7001 Myasthenia gravis with (acute) exacerbation: Secondary | ICD-10-CM

## 2023-12-20 MED ORDER — EFGARTIGIMOD ALFA-HYALUR-QVFC 180-2000 MG-UNIT/ML ~~LOC~~ SOLN
1008.0000 mg | Freq: Once | SUBCUTANEOUS | Status: AC
  Administered 2023-12-20: 1008 mg via SUBCUTANEOUS
  Filled 2023-12-20: qty 5.6

## 2023-12-20 NOTE — Progress Notes (Signed)
 Diagnosis: Myasthenia Gravis  Provider:  Chilton Greathouse MD  Procedure: Injection  Vyvgart Hytrulo, Dose: 1008mg  , Site: subcutaneous, Number of injections: 1  Injection Site(s): Left lower quad. abdomen  Post Care: Observation period completed  Discharge: Condition: Good, Destination: Home . AVS Provided  Performed by:  Adriana Mccallum, RN

## 2023-12-25 ENCOUNTER — Ambulatory Visit

## 2023-12-26 ENCOUNTER — Ambulatory Visit (INDEPENDENT_AMBULATORY_CARE_PROVIDER_SITE_OTHER)

## 2023-12-26 ENCOUNTER — Ambulatory Visit

## 2023-12-26 VITALS — BP 112/69 | HR 88 | Temp 97.5°F | Resp 14 | Ht 73.0 in | Wt 175.4 lb

## 2023-12-26 DIAGNOSIS — G7001 Myasthenia gravis with (acute) exacerbation: Secondary | ICD-10-CM

## 2023-12-26 MED ORDER — EFGARTIGIMOD ALFA-HYALUR-QVFC 180-2000 MG-UNIT/ML ~~LOC~~ SOLN
1008.0000 mg | Freq: Once | SUBCUTANEOUS | Status: AC
  Administered 2023-12-26: 1008 mg via SUBCUTANEOUS
  Filled 2023-12-26: qty 5.6

## 2023-12-26 NOTE — Progress Notes (Signed)
 Diagnosis: , Myasthenia Gravis  Provider:  Mannam, Praveen MD  Procedure: Injection  VYVGART  HYTRULO, Dose: 1008mg , Site: subcutaneous, Number of injections: 1  Injection Site(s): Right lower quad. abdomne  Post Care: Observation period completed and Subcutaneous infusion needle discontinued. Pt wants to wait to schedule next appointment until he sees the Drs appointment. The wife said she will call us  after his appointment with Doctor.No future appointment is schedule at this time.  Discharge: Condition: Good, Destination: Home . AVS Declined  Performed by:  Chase Knebel, RN

## 2024-01-01 ENCOUNTER — Ambulatory Visit

## 2024-01-09 ENCOUNTER — Ambulatory Visit: Admitting: Neurology

## 2024-01-09 ENCOUNTER — Telehealth (HOSPITAL_COMMUNITY): Payer: Self-pay | Admitting: Pharmacist

## 2024-01-09 ENCOUNTER — Other Ambulatory Visit (HOSPITAL_COMMUNITY): Payer: Self-pay | Admitting: Pharmacist

## 2024-01-09 ENCOUNTER — Encounter: Payer: Self-pay | Admitting: Neurology

## 2024-01-09 VITALS — BP 134/69 | HR 79 | Ht 73.0 in | Wt 174.0 lb

## 2024-01-09 DIAGNOSIS — G7000 Myasthenia gravis without (acute) exacerbation: Secondary | ICD-10-CM | POA: Diagnosis not present

## 2024-01-09 NOTE — Progress Notes (Signed)
 Follow-up Visit   Date: 01/09/2024    Joseph [name] MRN: [id number] DOB: 1931-11-06    Joseph [name] is a 89 y.o. left-handed Caucasian male with diet controlled diabetes mellitus, GERD, hypertension, history of prostate cancer, lung cancer s/p left upper lobe resection (2019), and atrial fibrillation returning to the clinic for follow-up of seropositive myasthenia gravis.  The patient was accompanied to the clinic by daughter who also provides collateral information.    IMPRESSION/PLAN: Seropositive generalized myasthenia gravis without exacerbation, thymoma negative (08/2022).  No evidence of disease manifestation on exam.  I think his episodic dysphagia may be related to esophageal stricture.  GI side effects are less bothersome.  - Change frequency of Vyvgarty Hytrlulo to every 7 weeks - Reduce mestinon  to 60mg  twice daily at 11a and 4pm.  OK to take extra dose as needed - Continue prednisone  5mg  daily  Return to clinic in 3 months  --------------------------------------------- History of present illness: Early August 2024, he began having double vision and left eye ptosis.  Daughter went to the ER due to concern of stroke where CT head was negative.  He saw Dr. Octavia the following day who felt that symptoms were due to myasthenia gravis so urged him to go to the ER.  He went to [facility] where MRI brain did not show stroke.  There was high clinical suspicion for myasthenia so he was started on mestinon  30mg  three times daily which did help his difficulty with swallowing and shortness of breath.  Unfortunately, there has been no improvement with droopiness of the eyelids.  Eyelids are slightly better for about 15 minutes in the morning, but then quickly start to feel heavy again. He does not notice double vision much because eyes stay closed.  He is very frustrated by not being able to see or keep his eyes open.    UPDATE 10/25/2022:  He is here for follow-up visit.  He has  been on prednisone  10mg  daily x 2 weeks, and received his second infusion of Vyvgart  Hytrulo yesterday.  Over the past 3-4 days, he noticed that his right eyelid was opening up and staying open for longer periods of time.  When tired and in the evening, it continues to get droopy.  His speech and swallow is normal.  He has some weakness in the legs and uses a walker. Overall, he is feeling much better.  No new complaints.  He has been monitor blood sugar at home which is staying 140-180s.    UPDATE 11/29/2022:  He is here for for follow-up visit.  He reports doing great and denies any ptosis, facial weakness, slurred, or difficulty with swallowing.  He is back to doing his usual activities and mood is much better.  He is scheduled for Vyvgart  Hytrullo next month.   UPDATE 02/21/2023:  He is here for follow-up visit.  He has been doing well on prednisone  7mg  daily. He denies double vision, droopiness of the eyelids, speech/swallowing difficulty, or weakness.   He completed Vyvgart  Hytrulo with last infusion on 12/10.  He was billed $3200 for his infusions last year and was told it would cost him $3400 with the first injection.  He was having quivering' of the left leg and had steroid injection to the left hip after which his symptoms have signficantly improved.    UPDATE 04/25/2023:  He started having difficulty with swallowing and started droopy again around late February.  He was approved for Vyvgart  Hytrulo and within  one week of his first infusion, he started having improvement.  He denies any current double vision, droopiness of the eyelids, or limb weakness. He remains on prednisone  5mg  daily.   UPDATE 07/25/2023:  He is here for follow-up visit.  He completed Vyvgart  Hytrulo on 6/17 which was 8 weeks from prior cycle and has been doing great.  He did not have any MG symptoms prior to his infusion.  He is doing well and denies droopiness, problems with speech/swallow, or weakness.  He is bothered by  excessive tearing from his eyes, which has been present the past few months.  No new complaints.   Of note, his HR is elevated today and irregular.  He denies chest pain, palpitation, or shortness of breath.   UPDATE 10/24/2023:  He is here for follow-up visit.  His MG is doing great and he denies any droopiness, double vision, difficulty with speech/swallow, or weakness.  His last Vyvgart  Hytrulo was completed on 9/9 and he is rescheduled in 9 weeks.  He has tolerated this very well.  He does report having GI upset and increased loose stool and asking if medications could be contributing.    Since he was last here, he was newly diagnosed with atrial fibrillation and converted to NSR on medication.  He is now on anticoagulation.    UPDATE 01/09/2024:  He is here for follow-up visit.  He called the office in November with new swallowing difficulty which was about 8 weeks from his last Vyvgart  injection and the infusion center was able to schedule him sooner for his series of injections.  He has been doing well.  No double vision, ptosis, speech changes, or limb weakness.  He continues to have spells of swallowing difficulty but also reports having history of esophageal stricture.   He also reports having right shoulder pain and was given trigger injection by orthopaedics.    Medications:  Current Outpatient Medications on File Prior to Visit  Medication Sig Dispense Refill   acetaminophen  (TYLENOL ) 500 MG tablet Take 500-1,000 mg by mouth every 8 (eight) hours as needed for moderate pain or headache.     amiodarone  (PACERONE ) 200 MG tablet Take 1 tablet (200 mg total) by mouth daily. 90 tablet 3   amoxicillin (AMOXIL) 500 MG capsule Take 2,000 mg by mouth See admin instructions. Take 4 capsules (2000 mg) by mouth 1 hours prior to procedure.     apixaban  (ELIQUIS ) 5 MG TABS tablet Take 1 tablet (5 mg total) by mouth 2 (two) times daily. 180 tablet 2   Ascorbic Acid (VITAMIN C) POWD Take 1 packet by  mouth daily.     CALCIUM PO Take 1 tablet by mouth daily.     Cholecalciferol (VITAMIN D-3 PO) Take 1 capsule by mouth daily.     ELDERBERRY PO Take 1 each by mouth daily. Elderberry gummy supplement     empagliflozin  (JARDIANCE ) 10 MG TABS tablet Take 1 tablet (10 mg total) by mouth daily. 90 tablet 3   guaiFENesin  (ROBITUSSIN) 100 MG/5ML liquid Take 5 mLs by mouth every 4 (four) hours as needed for cough or to loosen phlegm. 120 mL 0   ipratropium (ATROVENT ) 0.06 % nasal spray Place 1 spray into both nostrils 3 (three) times daily.     loratadine  (CLARITIN ) 10 MG tablet Take 10 mg by mouth daily.     losartan  (COZAAR ) 25 MG tablet Take 1 tablet (25 mg total) by mouth daily. 90 tablet 3   meclizine  (ANTIVERT ) 25  MG tablet Take 25 mg by mouth 3 (three) times daily as needed for dizziness.     Multiple Vitamins-Minerals (MULTIVITAMIN MEN 50+) TABS Take 1 tablet by mouth daily.     ondansetron  (ZOFRAN ) 4 MG tablet Take 4 mg by mouth every 8 (eight) hours as needed for nausea or vomiting.     pantoprazole  (PROTONIX ) 40 MG tablet Take 1 tablet (40 mg total) by mouth 2 (two) times daily. 180 tablet 4   predniSONE  (DELTASONE ) 5 MG tablet Take 1 tablet (5 mg total) by mouth daily with breakfast. 90 tablet 1   pyridostigmine  (MESTINON ) 60 MG tablet Take 1 tablet (60 mg total) by mouth 3 (three) times daily. 270 tablet 1   spironolactone  (ALDACTONE ) 25 MG tablet Take 0.5 tablets (12.5 mg total) by mouth daily. 90 tablet 3   No current facility-administered medications on file prior to visit.    Allergies:  Allergies  Allergen Reactions   No Known Allergies Other (See Comments)    Pt has no known allergies, but has Hx of Myasthenia Gravis    Vital Signs:  BP 134/69   Pulse 79   Ht 6' 1 (1.854 m)   Wt 174 lb (78.9 kg)   SpO2 97%   BMI 22.96 kg/m   Neurological Exam: MENTAL STATUS including orientation to time, place, person, recent and remote memory, attention span and concentration,  language, and fund of knowledge is normal.  Speech is not dysarthric.  CRANIAL NERVES:    Normal conjugate, extra-ocular eye movements in all directions of gaze.  No ptosis at rest with no worsening with sustained upgaze. Face is symmetric. Orbicularis oculi 5/5, orbicularis oris 5/5, buccinator 5/5.  Palate elevates symmetrically.  Tongue is midline and strength is 5/5  MOTOR:  Motor strength is 5/5 in all extremities, including bilateral hip flexors.  No atrophy, fasciculations or abnormal movements.  No pronator drift.  Tone is normal.    COORDINATION/GAIT:  He is able to stand up without pushing off with arms.  Gait is mildly wide-based, unassisted.    Data: Labs 09/13/2022:  AChR antibody binding 3.73*   MRI brain wwo contrast 09/12/2022: 1. No acute intracranial pathology or other finding to explain the patient's symptoms. 2. 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 3. On initial injection of intravenous contrast, the patient's IV infiltrative and 7.5 cc con trast was injected. Per the technologist, there was mild swelling at the injection site but no significant pain or neurologic symptoms. Recommend monitoring of the IV site while the patient is in the ED/inpatient, with instructions to return if progressive pain or neurologic symptoms develop.   CT chest wo contrast 09/15/2022:  No evidence of thymoma. Status post left upper lobectomy. No evidence of recurrent or metastatic disease. Moderate hiatal hernia with mildly irregular left lateral wall thickening, poorly evaluated. Consider endoscopy for further evaluation.   Aortic Atherosclerosis (ICD10-I70.0) and Emphysema (ICD10-J43.9).    Thank you for allowing me to participate in patient's care.  If I can answer any additional questions, I would be pleased to do so.    Sincerely,    Airika Alkhatib K. Tobie, DO

## 2024-01-09 NOTE — Telephone Encounter (Signed)
 Joseph [name]  Sent: 01/09/2024  10:07 AM EST To: Joseph [name], CPhT   Hi Kim, this patient is doing well on Vyvgart  Hytrulo.  Let's plan to change the frequency of his injections to every 7 weeks.  Thank you! Donika  Therapy plan addended.  Patient's last Vyvgart  cycle was completed on 12/26/23.  Next Vyvgart  Hytrulo cycle would start 7 weeks (02/13/24)  Sherry Pennant, PharmD, MPH, BCPS, CPP Clinical Pharmacist

## 2024-01-09 NOTE — Patient Instructions (Signed)
 Change frequency of Vyvgarty Hytrlulo to every 7 weeks 2.   Stop morning mestinon .  Reduce mestinon  to 60mg  twice daily at 11a and 4pm.  OK to take extra dose as needed 3.  Continue prednisone  5mg  daily

## 2024-01-11 ENCOUNTER — Other Ambulatory Visit (HOSPITAL_COMMUNITY): Payer: Self-pay | Admitting: Specialist

## 2024-01-11 DIAGNOSIS — M542 Cervicalgia: Secondary | ICD-10-CM

## 2024-01-15 ENCOUNTER — Other Ambulatory Visit: Payer: Self-pay | Admitting: Specialist

## 2024-01-15 DIAGNOSIS — M542 Cervicalgia: Secondary | ICD-10-CM

## 2024-01-17 ENCOUNTER — Ambulatory Visit
Admission: RE | Admit: 2024-01-17 | Discharge: 2024-01-17 | Disposition: A | Source: Ambulatory Visit | Attending: Specialist | Admitting: Specialist

## 2024-01-17 DIAGNOSIS — M542 Cervicalgia: Secondary | ICD-10-CM

## 2024-01-27 ENCOUNTER — Other Ambulatory Visit: Payer: Self-pay | Admitting: Internal Medicine

## 2024-02-12 ENCOUNTER — Ambulatory Visit

## 2024-02-12 VITALS — BP 104/63 | HR 95 | Temp 98.1°F | Resp 18 | Ht 73.0 in | Wt 170.8 lb

## 2024-02-12 DIAGNOSIS — G7000 Myasthenia gravis without (acute) exacerbation: Secondary | ICD-10-CM | POA: Diagnosis not present

## 2024-02-12 MED ORDER — EFGARTIGIMOD ALFA-HYALUR-QVFC 180-2000 MG-UNIT/ML ~~LOC~~ SOLN
1008.0000 mg | Freq: Once | SUBCUTANEOUS | Status: AC
  Administered 2024-02-12: 1008 mg via SUBCUTANEOUS
  Filled 2024-02-12: qty 5.6

## 2024-02-12 NOTE — Progress Notes (Signed)
 Diagnosis: Myasthenia Gravis  Provider:  Mannam, Praveen MD  Procedure: Injection  Vyvgart  Hytrulo, Dose: 1008 mg, Site: subcutaneous, Number of injections: 1  Injection Site(s): Left upper quad. abdomen  Post Care: Observation period completed and Subcutaneous infusion needle discontinued  Discharge: Condition: Good, Destination: Home . AVS Provided  Performed by:  Santina Trillo, RN

## 2024-02-13 ENCOUNTER — Telehealth: Payer: Self-pay | Admitting: Pharmacy Technician

## 2024-02-13 NOTE — Telephone Encounter (Signed)
 Auth Submission: NO AUTH NEEDED Site of care: Site of care: [facility] Payer: [employer] Medication & CPT/J Code(s) submitted: vyvgart  hytrullo [id number] Diagnosis Code: g70.00 Route of submission (phone, fax, portal):  Phone # Fax # Auth type: Buy/Bill PB Units/visits requested: 1x weekly Reference number:  Approval from: 02/12/24 to 01/29/25    Spoke w/daughter Peggy in regards to [employer]. I have forwarded this to [employer].

## 2024-02-19 ENCOUNTER — Ambulatory Visit

## 2024-02-19 VITALS — BP 135/82 | HR 88 | Temp 98.3°F | Resp 16 | Ht 73.0 in | Wt 171.0 lb

## 2024-02-19 DIAGNOSIS — G7000 Myasthenia gravis without (acute) exacerbation: Secondary | ICD-10-CM

## 2024-02-19 MED ORDER — EFGARTIGIMOD ALFA-HYALUR-QVFC 180-2000 MG-UNIT/ML ~~LOC~~ SOLN
1008.0000 mg | Freq: Once | SUBCUTANEOUS | Status: AC
  Administered 2024-02-19: 1008 mg via SUBCUTANEOUS
  Filled 2024-02-19: qty 5.6

## 2024-02-19 NOTE — Progress Notes (Signed)
 Diagnosis: Myasthenia Gravis  Provider:  Mannam, Praveen MD  Procedure: Injection  Vyvgart  Hytrulo, Dose: 1008 mg, Site: subcutaneous, Number of injections: 1  Injection Site(s): Right upper quad. abdomen  Post Care: Observation period completed and Subcutaneous infusion needle discontinued  Discharge: Condition: Good, Destination: Home . AVS Provided  Performed by:  Aedon Deason, RN

## 2024-02-26 ENCOUNTER — Ambulatory Visit

## 2024-02-26 VITALS — BP 137/80 | HR 96 | Temp 97.8°F | Resp 18 | Ht 73.0 in | Wt 173.0 lb

## 2024-02-26 DIAGNOSIS — G7000 Myasthenia gravis without (acute) exacerbation: Secondary | ICD-10-CM | POA: Diagnosis not present

## 2024-02-26 MED ORDER — EFGARTIGIMOD ALFA-HYALUR-QVFC 180-2000 MG-UNIT/ML ~~LOC~~ SOLN
1008.0000 mg | Freq: Once | SUBCUTANEOUS | Status: AC
  Administered 2024-02-26: 1008 mg via SUBCUTANEOUS
  Filled 2024-02-26: qty 5.6

## 2024-02-26 NOTE — Progress Notes (Signed)
 Diagnosis: Myasthenia Gravis  Provider:  Mannam, Praveen MD  Procedure: Injection  Vyvgart  Hytrulo, Dose: 1008mg , Site: subcutaneous, Number of injections: 1  Injection Site(s): Left lower quad. abdomen  Post Care: Patient declined observation and Subcutaneous infusion needle discontinued  Discharge: Condition: Good, Destination: Home . AVS Provided  Performed by:  Nimah Uphoff, RN

## 2024-03-04 ENCOUNTER — Ambulatory Visit

## 2024-03-05 ENCOUNTER — Ambulatory Visit (INDEPENDENT_AMBULATORY_CARE_PROVIDER_SITE_OTHER)

## 2024-03-05 ENCOUNTER — Ambulatory Visit

## 2024-03-05 VITALS — BP 134/78 | HR 84 | Temp 98.0°F | Resp 18 | Ht 73.0 in | Wt 172.2 lb

## 2024-03-05 DIAGNOSIS — G7000 Myasthenia gravis without (acute) exacerbation: Secondary | ICD-10-CM

## 2024-03-05 MED ORDER — EFGARTIGIMOD ALFA-HYALUR-QVFC 180-2000 MG-UNIT/ML ~~LOC~~ SOLN
1008.0000 mg | Freq: Once | SUBCUTANEOUS | Status: AC
  Administered 2024-03-05: 1008 mg via SUBCUTANEOUS
  Filled 2024-03-05: qty 5.6

## 2024-03-05 NOTE — Progress Notes (Signed)
 Diagnosis: Myasthenia Gravis  Provider:  Mannam, Praveen MD  Procedure: Injection  Vyvgart  Hytrulo, Dose: 1008mg , Site: subcutaneous, Number of injections: 1  Injection Site(s): Right lower quad. abdomne  Post Care: Patient declined observation and Subcutaneous infusion needle discontinued  Discharge: Condition: Good, Destination: Home . AVS Provided  Performed by:  Rook Maue, RN

## 2024-03-06 ENCOUNTER — Ambulatory Visit

## 2024-04-08 ENCOUNTER — Ambulatory Visit: Admitting: Neurology

## 2024-04-22 ENCOUNTER — Ambulatory Visit

## 2024-04-23 ENCOUNTER — Ambulatory Visit

## 2024-04-29 ENCOUNTER — Ambulatory Visit

## 2024-04-30 ENCOUNTER — Ambulatory Visit

## 2024-05-06 ENCOUNTER — Ambulatory Visit

## 2024-05-07 ENCOUNTER — Ambulatory Visit

## 2024-05-13 ENCOUNTER — Ambulatory Visit

## 2024-05-14 ENCOUNTER — Ambulatory Visit
